# Patient Record
Sex: Female | Born: 1970 | Race: Black or African American | Hispanic: No | Smoking: Former smoker
Health system: Southern US, Community
[De-identification: ages and names within clinical notes are randomized; demographics above are authoritative.]

## PROBLEM LIST (undated history)

## (undated) DIAGNOSIS — I10 Essential (primary) hypertension: Secondary | ICD-10-CM

## (undated) DIAGNOSIS — E119 Type 2 diabetes mellitus without complications: Secondary | ICD-10-CM

## (undated) DIAGNOSIS — K219 Gastro-esophageal reflux disease without esophagitis: Secondary | ICD-10-CM

## (undated) DIAGNOSIS — T883XXA Malignant hyperthermia due to anesthesia, initial encounter: Secondary | ICD-10-CM

## (undated) DIAGNOSIS — R748 Abnormal levels of other serum enzymes: Secondary | ICD-10-CM

## (undated) DIAGNOSIS — F32A Depression, unspecified: Secondary | ICD-10-CM

## (undated) DIAGNOSIS — F10939 Alcohol use, unspecified with withdrawal, unspecified: Secondary | ICD-10-CM

## (undated) DIAGNOSIS — M5412 Radiculopathy, cervical region: Secondary | ICD-10-CM

## (undated) DIAGNOSIS — F419 Anxiety disorder, unspecified: Secondary | ICD-10-CM

## (undated) HISTORY — DX: Alcohol use, unspecified with withdrawal, unspecified: F10.939

## (undated) HISTORY — PX: BRAIN SURGERY: SHX531

## (undated) HISTORY — DX: Abnormal levels of other serum enzymes: R74.8

## (undated) HISTORY — DX: Radiculopathy, cervical region: M54.12

---

## 1998-04-10 ENCOUNTER — Ambulatory Visit (HOSPITAL_COMMUNITY): Admission: RE | Admit: 1998-04-10 | Discharge: 1998-04-10 | Payer: Self-pay | Admitting: Obstetrics

## 1998-05-30 ENCOUNTER — Inpatient Hospital Stay (HOSPITAL_COMMUNITY): Admission: AD | Admit: 1998-05-30 | Discharge: 1998-06-01 | Payer: Self-pay | Admitting: Obstetrics

## 2002-06-04 ENCOUNTER — Encounter: Payer: Self-pay | Admitting: *Deleted

## 2002-06-04 ENCOUNTER — Emergency Department (HOSPITAL_COMMUNITY): Admission: EM | Admit: 2002-06-04 | Discharge: 2002-06-04 | Payer: Self-pay | Admitting: *Deleted

## 2002-11-27 ENCOUNTER — Encounter: Payer: Self-pay | Admitting: *Deleted

## 2002-11-27 ENCOUNTER — Ambulatory Visit (HOSPITAL_COMMUNITY): Admission: RE | Admit: 2002-11-27 | Discharge: 2002-11-27 | Payer: Self-pay | Admitting: *Deleted

## 2002-11-30 ENCOUNTER — Inpatient Hospital Stay (HOSPITAL_COMMUNITY): Admission: AD | Admit: 2002-11-30 | Discharge: 2002-12-01 | Payer: Self-pay | Admitting: *Deleted

## 2003-06-19 ENCOUNTER — Encounter: Payer: Self-pay | Admitting: Emergency Medicine

## 2003-06-19 ENCOUNTER — Emergency Department (HOSPITAL_COMMUNITY): Admission: EM | Admit: 2003-06-19 | Discharge: 2003-06-19 | Payer: Self-pay | Admitting: Emergency Medicine

## 2003-06-27 ENCOUNTER — Emergency Department (HOSPITAL_COMMUNITY): Admission: EM | Admit: 2003-06-27 | Discharge: 2003-06-27 | Payer: Self-pay | Admitting: Internal Medicine

## 2003-09-14 ENCOUNTER — Emergency Department (HOSPITAL_COMMUNITY): Admission: EM | Admit: 2003-09-14 | Discharge: 2003-09-14 | Payer: Self-pay | Admitting: Emergency Medicine

## 2004-06-16 ENCOUNTER — Inpatient Hospital Stay (HOSPITAL_COMMUNITY): Admission: AD | Admit: 2004-06-16 | Discharge: 2004-06-19 | Payer: Self-pay | Admitting: Obstetrics & Gynecology

## 2005-04-14 ENCOUNTER — Emergency Department (HOSPITAL_COMMUNITY): Admission: EM | Admit: 2005-04-14 | Discharge: 2005-04-14 | Payer: Self-pay | Admitting: *Deleted

## 2005-04-30 ENCOUNTER — Emergency Department (HOSPITAL_COMMUNITY): Admission: EM | Admit: 2005-04-30 | Discharge: 2005-04-30 | Payer: Self-pay | Admitting: Emergency Medicine

## 2005-07-19 ENCOUNTER — Observation Stay (HOSPITAL_COMMUNITY): Admission: AD | Admit: 2005-07-19 | Discharge: 2005-07-19 | Payer: Self-pay | Admitting: Obstetrics and Gynecology

## 2005-08-18 ENCOUNTER — Ambulatory Visit (HOSPITAL_COMMUNITY): Admission: RE | Admit: 2005-08-18 | Discharge: 2005-08-18 | Payer: Self-pay | Admitting: Obstetrics and Gynecology

## 2005-10-13 ENCOUNTER — Inpatient Hospital Stay (HOSPITAL_COMMUNITY): Admission: AD | Admit: 2005-10-13 | Discharge: 2005-10-15 | Payer: Self-pay | Admitting: Obstetrics and Gynecology

## 2006-06-15 ENCOUNTER — Emergency Department (HOSPITAL_COMMUNITY): Admission: EM | Admit: 2006-06-15 | Discharge: 2006-06-15 | Payer: Self-pay | Admitting: *Deleted

## 2006-09-14 ENCOUNTER — Ambulatory Visit (HOSPITAL_COMMUNITY): Admission: RE | Admit: 2006-09-14 | Discharge: 2006-09-14 | Payer: Self-pay | Admitting: Obstetrics & Gynecology

## 2006-12-24 ENCOUNTER — Emergency Department (HOSPITAL_COMMUNITY): Admission: EM | Admit: 2006-12-24 | Discharge: 2006-12-24 | Payer: Self-pay | Admitting: Emergency Medicine

## 2006-12-30 ENCOUNTER — Ambulatory Visit: Payer: Self-pay | Admitting: Orthopedic Surgery

## 2007-04-07 ENCOUNTER — Ambulatory Visit: Payer: Self-pay | Admitting: Orthopedic Surgery

## 2007-05-31 ENCOUNTER — Encounter: Payer: Self-pay | Admitting: Orthopedic Surgery

## 2008-10-07 ENCOUNTER — Emergency Department (HOSPITAL_COMMUNITY): Admission: EM | Admit: 2008-10-07 | Discharge: 2008-10-07 | Payer: Self-pay | Admitting: Emergency Medicine

## 2008-11-06 ENCOUNTER — Emergency Department (HOSPITAL_COMMUNITY): Admission: EM | Admit: 2008-11-06 | Discharge: 2008-11-06 | Payer: Self-pay | Admitting: Internal Medicine

## 2008-12-07 ENCOUNTER — Encounter (INDEPENDENT_AMBULATORY_CARE_PROVIDER_SITE_OTHER): Payer: Self-pay | Admitting: *Deleted

## 2008-12-29 ENCOUNTER — Emergency Department (HOSPITAL_COMMUNITY): Admission: EM | Admit: 2008-12-29 | Discharge: 2008-12-29 | Payer: Self-pay | Admitting: Emergency Medicine

## 2009-01-08 ENCOUNTER — Other Ambulatory Visit: Admission: RE | Admit: 2009-01-08 | Discharge: 2009-01-08 | Payer: Self-pay | Admitting: Obstetrics & Gynecology

## 2009-05-14 ENCOUNTER — Encounter: Payer: Self-pay | Admitting: Orthopedic Surgery

## 2009-08-30 ENCOUNTER — Emergency Department (HOSPITAL_COMMUNITY): Admission: EM | Admit: 2009-08-30 | Discharge: 2009-08-30 | Payer: Self-pay | Admitting: Emergency Medicine

## 2009-11-09 ENCOUNTER — Emergency Department (HOSPITAL_COMMUNITY): Admission: EM | Admit: 2009-11-09 | Discharge: 2009-11-09 | Payer: Self-pay | Admitting: Emergency Medicine

## 2010-01-06 ENCOUNTER — Emergency Department (HOSPITAL_COMMUNITY): Admission: EM | Admit: 2010-01-06 | Discharge: 2010-01-07 | Payer: Self-pay | Admitting: Emergency Medicine

## 2010-10-18 ENCOUNTER — Encounter: Payer: Self-pay | Admitting: Orthopedic Surgery

## 2010-10-29 ENCOUNTER — Ambulatory Visit: Payer: Self-pay | Admitting: Orthopedic Surgery

## 2010-10-29 DIAGNOSIS — M5412 Radiculopathy, cervical region: Secondary | ICD-10-CM | POA: Insufficient documentation

## 2010-10-29 DIAGNOSIS — M25519 Pain in unspecified shoulder: Secondary | ICD-10-CM | POA: Insufficient documentation

## 2011-01-20 NOTE — Assessment & Plan Note (Signed)
Summary: bi shoulder pain needs xr/ca mcd rchd/bsf   Vital Signs:  Patient profile:   40 year old female Height:      66 inches Weight:      165 pounds Pulse rate:   82 / minute Resp:     18 per minute  Vitals Entered By: Ether Griffins (October 29, 2010 2:26 PM)  Visit Type:  new patient Referring Byrd Rushlow:  Health Department Primary Cristela Stalder:  Health Department  CC:  neck and left shoulder pain.  History of Present Illness: I saw Heidi [name] in the office today for an initial visit.  She is a 40 years old woman with the complaint of:  neck and left shoulder pain.  Xrays today.  Meds: Flexeril and Ibuprofen 800mg .  2008 was seen for left shoulder and received injection.   40 year old female, who is employed in what sounds like a cleaners, presents with cervical pain radiating down to the LEFT hand, associated with some numbness and weakness, but minimal shoulder pain, somewhat relieved with Flexeril and ibuprofen. Presents for evaluation. No trauma.     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 Cardiovascular:  Denies chest pain, palpitations, fainting, and murmurs. Respiratory:  Denies short of breath, wheezing, couch, tightness, pain on inspiration, and snoring . Gastrointestinal:  Denies heartburn, nausea, vomiting, diarrhea, constipation, and blood in your stools. Genitourinary:  Denies frequency, urgency, difficulty urinating, painful urination, flank pain, and bleeding in urine. Neurologic:  Denies numbness, tingling, unsteady gait, dizziness,  tremors, and seizure. Musculoskeletal:  Denies joint pain, swelling, instability, stiffness, redness, heat, and muscle pain. Endocrine:  Denies excessive thirst, exessive urination, and heat or cold intolerance. Psychiatric:  Denies nervousness, depression, anxiety, and hallucinations. Skin:  Denies changes in the skin, poor healing, rash, itching, and redness. HEENT:  Denies blurred or double vision, eye pain, redness, and watering. Immunology:  Denies seasonal allergies, sinus problems, and allergic to bee stings. Hemoatologic:  Denies easy bleeding and brusing.  Physical Exam  Skin:  intact without lesions or rashes Cervical Nodes:  no significant adenopathy Psych:  alert and cooperative; normal mood and affect; normal attention span and concentration Additional Exam:  There is decreased cervical range of motion tenderness in the upper LEFT trap and cervical spine.  There is loss of normal cervical lordosis.   Shoulder/Elbow Exam  General:    Well-developed, well-nourished, normal body habitus; no deformities, normal grooming.    Palpation:    Non-tender to palpation bilaterally.    Vascular:    Radial, ulnar, brachial, and axillary pulses 2+ and symmetric; capillary refill less than 2 seconds; no evidence of ischemia, clubbing, or cyanosis.    Sensory:    Gross sensation intact in the upper extremities.    Motor:    Normal strength in the upper extremities.    Reflexes:    Normal reflexes in the upper extremities.    Shoulder Exam:    Right:    Inspection:  Normal    Palpation:  Normal    Stability:  stable    Tenderness:  no    Swelling:  no  Erythema:  no    Range of Motion:       Flexion-Active: 180       Extension-Active: 45       Flexion-Passive: 180       Extension-Passive: 45       External Rotation : 45       Interior Rotation : T7    Left:    Inspection:  Normal    Palpation:  Normal    Stability:  stable    Tenderness:  no    Swelling:  no     Erythema:  no    Range of Motion:       Flexion-Active: 180       Extension-Active: 45       Flexion-Passive: 180       Extension-Passive: 45       External Rotation : 45       Interior Rotation : T7  Impingement Sign NEER:    Left negative Impingement Sign HAWKINS:    Left negative   Impression & Recommendations:  Problem # 1:  CERVICAL RADICULOPATHY (ICD-723.4) Assessment New  radiographs AP, lateral, LEFT shoulder, show no abnormality with a type II acromion.  Impression type II acromion. Normal shoulder.  Cervical spine AP, lateral, shows loss of cervical lordosis. No subluxation. No fracture. No displacement narrowing.  Impression normal next item except for loss of the cervical lordosis.  Overall impression probably cervical radicular pain. Recommend a run and continue Flexeril, and ibuprofen  Orders: New Patient Level III (16109) Cervical x-ray 2/3 views (60454)  Problem # 2:  SHOULDER PAIN (ICD-719.41) Assessment: New  Orders: New Patient Level III (09811) Shoulder x-ray,  minimum 2 views (91478)  Medications Added to Medication List This Visit: 1)  Neurontin 100 Mg Caps (Gabapentin) .Marland Kitchen.. 1 by mouth at hs  Patient Instructions: 1)  Start new medication. 2)  Come back as needed Prescriptions: NEURONTIN 100 MG CAPS (GABAPENTIN) 1 by mouth at hs  #30 x 1   Entered and Authorized by:   Fuller Canada MD   Signed by:   Fuller Canada MD on 10/29/2010   Method used:   Print then Give to Patient   RxID:   [id number]    Orders Added: 1)  New Patient Level III [62952] 2)  Shoulder x-ray,  minimum 2 views [73030] 3)  Cervical x-ray 2/3 views [72040]

## 2011-01-20 NOTE — Progress Notes (Signed)
Summary: Progress notes  Progress notes   Imported By: Jacklynn Ganong 10/27/2010 14:01:22  _____________________________________________________________________  External Attachment:    Type:   Image     Comment:   External Document

## 2011-01-20 NOTE — Progress Notes (Signed)
Summary: int evaluation form  int evaluation form   Imported By: Jacklynn Ganong 10/27/2010 13:52:58  _____________________________________________________________________  External Attachment:    Type:   Image     Comment:   External Document

## 2011-01-23 NOTE — Letter (Signed)
Summary: history form  history form   Imported By: Jacklynn Ganong 10/30/2010 10:03:24  _____________________________________________________________________  External Attachment:    Type:   Image     Comment:   External Document

## 2011-01-28 ENCOUNTER — Encounter: Payer: Self-pay | Admitting: Orthopedic Surgery

## 2011-01-28 ENCOUNTER — Ambulatory Visit (INDEPENDENT_AMBULATORY_CARE_PROVIDER_SITE_OTHER): Payer: Medicaid Other | Admitting: Orthopedic Surgery

## 2011-01-28 DIAGNOSIS — M542 Cervicalgia: Secondary | ICD-10-CM | POA: Insufficient documentation

## 2011-01-29 ENCOUNTER — Encounter: Payer: Self-pay | Admitting: Orthopedic Surgery

## 2011-02-04 ENCOUNTER — Ambulatory Visit (HOSPITAL_COMMUNITY)
Admission: RE | Admit: 2011-02-04 | Discharge: 2011-02-04 | Disposition: A | Discharge status: Home / Self Care | Payer: Medicaid Other | Source: Ambulatory Visit | Attending: Nurse Practitioner | Admitting: Nurse Practitioner

## 2011-02-04 DIAGNOSIS — M542 Cervicalgia: Secondary | ICD-10-CM | POA: Insufficient documentation

## 2011-02-04 DIAGNOSIS — M6281 Muscle weakness (generalized): Secondary | ICD-10-CM | POA: Insufficient documentation

## 2011-02-04 DIAGNOSIS — IMO0001 Reserved for inherently not codable concepts without codable children: Secondary | ICD-10-CM | POA: Insufficient documentation

## 2011-02-05 NOTE — Miscellaneous (Signed)
Summary: Rehab Report  Rehab Report   Imported By: Cammie Sickle 01/29/2011 08:55:06  _____________________________________________________________________  External Attachment:    Type:   Image     Comment:   External Document

## 2011-02-05 NOTE — Miscellaneous (Signed)
  Clinical Lists Changes  Medications: Added new medication of CATAFLAM 50 MG TABS (DICLOFENAC POTASSIUM) one tablet twice daily - Signed Added new medication of PREDNISONE (PAK) 10 MG TABS (PREDNISONE) as directed - Signed Rx of CATAFLAM 50 MG TABS (DICLOFENAC POTASSIUM) one tablet twice daily;  #60 x 0;  Signed;  Entered by: Fuller Canada MD;  Authorized by: Fuller Canada MD;  Method used: Print then Give to Patient Rx of PREDNISONE (PAK) 10 MG TABS (PREDNISONE) as directed;  #1 x 0;  Signed;  Entered by: Fuller Canada MD;  Authorized by: Fuller Canada MD;  Method used: Print then Give to Patient    Prescriptions: PREDNISONE (PAK) 10 MG TABS (PREDNISONE) as directed  #1 x 0   Entered and Authorized by:   Fuller Canada MD   Signed by:   Fuller Canada MD on 01/29/2011   Method used:   Print then Give to Patient   RxID:   [id number] CATAFLAM 50 MG TABS (DICLOFENAC POTASSIUM) one tablet twice daily  #60 x 0   Entered and Authorized by:   Fuller Canada MD   Signed by:   Fuller Canada MD on 01/29/2011   Method used:   Print then Give to Patient   RxID:   [id number]

## 2011-02-05 NOTE — Assessment & Plan Note (Signed)
Summary: bi shoulder pain rt is new problem needs xr/rchd to fax refer...   Visit Type:  Follow-up Referring Provider:  Health Department Primary Provider:  Health Department  CC:  bilateral shoulder and neck pain.  History of Present Illness: I saw Heidi [name] in the office today for a followup visit.  She is a 40 years old woman with the complaint of:  bilateral shoulder and neck pain.    40 year old female presents back after previous evaluation for neck and shoulder pain with bilateral shoulder pain and neck pain.  Her pain is in the trapezius muscles and is present only at night.  Her pain is bilateral.  She has been on various anti-inflammatories including naproxen and nabumetone.  She's also been on Flexeril and Neurontin and tramadol.  She now has complaints of dull 8/10 pain in the trapezius muscles without any neck pain or pain around the deltoid area.  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 Musculoskeletal:  Complains of joint pain and muscle pain; denies instability.  Physical Exam  Additional Exam:  bilateral shoulder exam reveals full range of motion without tenderness around the glenohumeral joint lines or the deltoid.  She does have tenderness in both trapezius muscles.  She is nontender over the cervical spine  Cervical spine range of motion is normal  Strength in her upper extremities normal both shoulders  are stable.     Shoulder/Elbow Exam  General:    Well-developed, well-nourished, normal body habitus; no deformities, normal grooming.    Skin:    Intact, no scars, lesions, rashes, cafe au lait spots or bruising.    Vascular:    Radial, ulnar, brachial, and axillary pulses 2+ and symmetric; capillary refill less than 2 seconds; no evidence of ischemia, clubbing, or cyanosis.    Sensory:    Gross sensation intact in the upper extremities.    Motor:    Normal strength in the upper extremities.    Reflexes:    Normal reflexes in the upper extremities.     Impression & Recommendations:  Problem # 1:  CERVICALGIA (ICD-723.1) Assessment Deteriorated  previous x-rays show straightening of the cervical spine but she does not have any neurologic deficits or weakness.  I think he is perhaps some tension in the cervical spine trapezius muscles and she would benefit from rehabilitation and continued on anti-inflammatory medication  Orders: Est. Patient Level III (16109)  Patient Instructions: 1)  home cervical traction 5 lbs 15 min / day  2)  cervical pillow from [facility]  3)  PT at [facility] neck strain x 4 weeks  4)  start dose pack 12 days followed by cataflam 50 mg two times a day  5)  continue the cyclobenzaprine  6)  stop ibuprofen  7)  Please schedule a follow-up appointment as needed.   Orders Added: 1)  Est. Patient Level III [60454]

## 2011-02-09 ENCOUNTER — Encounter: Payer: Self-pay | Admitting: Orthopedic Surgery

## 2011-02-10 ENCOUNTER — Ambulatory Visit (HOSPITAL_COMMUNITY)
Admission: RE | Admit: 2011-02-10 | Discharge: 2011-02-10 | Disposition: A | Discharge status: Home / Self Care | Payer: Medicaid Other | Source: Ambulatory Visit | Attending: *Deleted | Admitting: *Deleted

## 2011-02-11 NOTE — Medication Information (Signed)
Summary: Tax adviser   Imported By: Cammie Sickle 02/03/2011 20:45:45  _____________________________________________________________________  External Attachment:    Type:   Image     Comment:   External Document

## 2011-02-17 ENCOUNTER — Ambulatory Visit (HOSPITAL_COMMUNITY): Payer: Medicaid Other | Admitting: Physical Therapy

## 2011-02-17 NOTE — Miscellaneous (Signed)
Summary: PT initial evaluation  PT initial evaluation   Imported By: Jacklynn Ganong 02/10/2011 14:59:51  _____________________________________________________________________  External Attachment:    Type:   Image     Comment:   External Document

## 2011-02-17 NOTE — Letter (Signed)
Summary: Historic Patient File  Historic Patient File   Imported By: Jacklynn Ganong 02/09/2011 11:12:36  _____________________________________________________________________  External Attachment:    Type:   Image     Comment:   External Document

## 2011-02-17 NOTE — Letter (Signed)
Summary: Referral notes  Referral notes   Imported By: Jacklynn Ganong 02/09/2011 14:16:42  _____________________________________________________________________  External Attachment:    Type:   Image     Comment:   External Document

## 2011-04-26 ENCOUNTER — Emergency Department (HOSPITAL_COMMUNITY)
Admission: EM | Admit: 2011-04-26 | Discharge: 2011-04-26 | Disposition: A | Discharge status: Home / Self Care | Payer: Medicaid Other | Attending: Emergency Medicine | Admitting: Emergency Medicine

## 2011-04-26 DIAGNOSIS — K5289 Other specified noninfective gastroenteritis and colitis: Secondary | ICD-10-CM | POA: Insufficient documentation

## 2011-04-26 LAB — HEPATIC FUNCTION PANEL
ALT: 16 U/L (ref 0–35)
Alkaline Phosphatase: 83 U/L (ref 39–117)
Total Protein: 7.5 g/dL (ref 6.0–8.3)

## 2011-04-26 LAB — BASIC METABOLIC PANEL
BUN: 13 mg/dL (ref 6–23)
CO2: 20 mEq/L (ref 19–32)
Calcium: 9.5 mg/dL (ref 8.4–10.5)
Chloride: 105 mEq/L (ref 96–112)
Potassium: 3.5 mEq/L (ref 3.5–5.1)
Sodium: 139 mEq/L (ref 135–145)

## 2011-04-26 LAB — URINALYSIS, ROUTINE W REFLEX MICROSCOPIC
Nitrite: NEGATIVE
Specific Gravity, Urine: >1.03 — ABNORMAL HIGH (ref 1.005–1.030)

## 2011-04-26 LAB — DIFFERENTIAL
Basophils Absolute: 0 10*3/uL (ref 0.0–0.1)
Basophils Relative: 0 % (ref 0–1)
Eosinophils Relative: 0 % (ref 0–5)
Lymphocytes Relative: 3 % — ABNORMAL LOW (ref 12–46)
Neutro Abs: 15.3 10*3/uL — ABNORMAL HIGH (ref 1.7–7.7)
Neutrophils Relative %: 95 % — ABNORMAL HIGH (ref 43–77)

## 2011-05-08 NOTE — Op Note (Signed)
NAMENOZOMI, METTLER NO.:  [id number]   MEDICAL RECORD NO.:  [id number]          PATIENT TYPE:  INP   LOCATION:  A401                          FACILITY:  [facility]   PHYSICIAN:  Tilda Burrow, M.D. DATE OF BIRTH:  1971-02-09   DATE OF PROCEDURE:  DATE OF DISCHARGE:                                 OPERATIVE REPORT   Heidi [name] progressed steadily through labor. I arrived at the hospital at  about 12:15, and she was noted to be fully dilated with bulging membranes.  Artificial rupture of membranes revealed clear fluid. She pushed through  about three or four contractions and had a spontaneous vaginal delivery of a  viable female infant at 12:58. The shoulders were a little bit tight. They  were delivered by placing traction under the left axilla which was anterior.  I rotated the baby into an oblique position, and the shoulders delivered  after that. Apgars are 8 and 9. Weight 7 pounds 9 ounces which is a good  pound bigger than her biggest baby. Twenty units of Pitocin diluted in 1,000  cc of lactated ringers was infused rapidly IV. The placenta separated  spontaneously and was delivered via controlled cord traction at 11:03. It  was inspected and appears to be intact with a three-vessel cord. The fundus  was firm. Lochia was normal. No lacerations of the vagina were found.      Heidi [name], C.N.M.      Tilda Burrow, M.D.  Electronically Signed    FC/MEDQ  D:  10/13/2005  T:  10/14/2005  Job:  [id number]   cc:   Francoise Schaumann. Halford Chessman  Fax: [phone]

## 2011-05-08 NOTE — H&P (Signed)
NAMEPATCHES, MCDONNELL NO.:  [id number]   MEDICAL RECORD NO.:  [id number]          PATIENT TYPE:  INP   LOCATION:  A401                          FACILITY:  [facility]   PHYSICIAN:  Lazaro Arms, M.D.   DATE OF BIRTH:  12/26/1970   DATE OF ADMISSION:  10/13/2005  DATE OF DISCHARGE:  LH                                HISTORY & PHYSICAL   CHIEF COMPLAINT:  Active labor.   HISTORY OF PRESENT ILLNESS:  Heidi [name] is a 40 year old gravida 5, para 3, AB  1 with an EDC of October 08, 2005 based on her one and only second trimester  ultrasound. She had a total of two prenatal visits throughout the whole  pregnancy. She has tested positive for cocaine on every visit. In fact, she  admitted to using cocaine last night.   PRENATAL LABORATORY DATA:  Blood type B positive, rubella immune. HBSAG,  HIV, RPR and gonorrhea were negative. She had positive chlamydia. She  received treatment on August 29 in the hospital with 2 g of p.o.  erythromycin. Her boyfriend is incarcerated, and she states she has not had  sex since then. She has not had a proof of cure, however. Sickle cell was  also negative. Her two blood pressures were 126/60 and 120/64. Her first  visit at 15 weeks she weighed 137, and then at 28-1/2 weeks, she had gained  50 pounds. Weight today is unknown.   PAST MEDICAL HISTORY:  Benign.   PAST SURGICAL HISTORY:  No surgical history.   ALLERGIES:  No known drug allergies.   SOCIAL HISTORY:  Unemployed. Uses cocaine, marijuana. Denies cigarette  smoking or alcohol use.   OBSTETRICAL HISTORY:  She has had three vaginal deliveries from 1991 up to  2005. The first two children live with their father and a friend.  Apparently, the last child, a little boy, does live with her.   PHYSICAL EXAMINATION:  HEENT:  Within normal limits.  HEART:  Regular rate and rhythm.  LUNGS:  Clear.  ABDOMEN:  Soft and nontender.   Having strong contractions about every four minutes. Fetal  heart rate is  reactive without decelerations. Blood pressure is within normal limits.  Cervix upon admission was 6 cm, 100% effaced, bulging members, vertex  presentation at 0 station.   IMPRESSION:  Intrauterine pregnancy at about 41 weeks, active labor,  positive drug abuse, limited prenatal care, unknown GBS status.   PLAN:  Will treat with ampicillin IV, obtain a GBS culture and anticipate  vaginal delivery.      Heidi [name], C.N.M.      Lazaro Arms, M.D.  Electronically Signed    FC/MEDQ  D:  10/13/2005  T:  10/14/2005  Job:  [id number]

## 2011-05-08 NOTE — Discharge Summary (Signed)
NAMEJAYLIAH, Heidi [name] NO.:  [id number]   MEDICAL RECORD NO.:  [id number]                   PATIENT TYPE:  INP   LOCATION:  A417                                 FACILITY:  [facility]   PHYSICIAN:  Tilda Burrow, M.D.              DATE OF BIRTH:  May 19, 1971   DATE OF ADMISSION:  06/16/2004  DATE OF DISCHARGE:                                 DISCHARGE SUMMARY   ADMISSION DIAGNOSES:  1. Pregnancy, [redacted] weeks gestation, early labor.  2. History of cocaine abuse.   DISCHARGE DIAGNOSES:  1. Pregnancy, 39 weeks, delivered.  2. History of cocaine abuse.  3. 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   HISTORY OF PRESENT ILLNESS:  This 40 year old African American female,  gravida 2, para 1, abortus 1, at [redacted] weeks gestation, was admitted for active  labor management after presenting first in the office at 3-4 cm, declining  to come to the hospital, but then arriving later in the evening, found to be  9 cm dilated with anterior __________ membranes ruptured, light meconium  present.   PAST MEDICAL HISTORY:  1. Cocaine abuse.  2. History of asthma.   PAST SURGICAL HISTORY:  Negative.   SOCIAL HISTORY:  The patient has two prior children who are in the custody  of their biologic fathers.  One lives here and one lives in [place].  She  is not inclined to seek rehab efforts but is, at present, at least claiming  that she hopes to stay clean for awhile.  She acknowledges she has had  similar desires before.   HOSPITAL COURSE:  The patient was admitted and progressed promptly to  delivery on June 16, 2004, delivering at approximately 11 p.m. on June 16, 2004.  Postpartum course was notable for the patient's somewhat  argumentative approach to discussions.  She was positive for cocaine on  admitting urine drug screen.  Maternal blood type is B positive.  Admitting  hemoglobin was 11.2, hematocrit 32.9.  Postpartum hemoglobin 10.8,  hematocrit 31.7.  Blood gas on the infant was pH 7.240 with normal PO2 17.0.   At discharge, I had discussion with the patient regarding cocaine use as  well as social factors regarding her daughter.   PLAN:  1. Will initiate Seroquel 200 mg q.h.s. to assist with the patient's     insomnia and impulse behavior at night.  2. __________ one week, consider titrating to twice daily doses consistent     with treatment for possible bipolar disorder.  3. Contraception plans are undecided, though, she is most likely wishing to     go back to the birth control pills.   Additionally, her 57 year old daughter who lives in the area is sexually  active, and the patient is to initiate conversation with her ex-husband to  get the daughter in for birth control counseling.     ___________________________________________  Tilda Burrow, M.D.   JVF/MEDQ  D:  06/19/2004  T:  06/19/2004  Job:  [id number]

## 2011-05-08 NOTE — Discharge Summary (Signed)
NAMEJONAI, WEYLAND NO.:  [id number]   MEDICAL RECORD NO.:  [id number]                   PATIENT TYPE:  INP   LOCATION:  LDR4                                 FACILITY:  [facility]   PHYSICIAN:  Langley Gauss, M.D.                DATE OF BIRTH:  10-21-1971   DATE OF ADMISSION:  11/30/2002  DATE OF DISCHARGE:  12/01/2002                                 DISCHARGE SUMMARY   DIAGNOSES:  A 22-1/2-week intrauterine pregnancy with preterm premature  spontaneous rupture of membranes.   DELIVERY PERFORMED:  Spontaneous assisted vaginal delivery with breech  presentation of 460 g nonviable infant.   DISPOSITION AT TIME OF DISCHARGE:  The patient is given a copy of standard  discharge instructions.  She will however, follow up in the office in 1  week's time.  She is treated with Depo-Provera 150 mg IM.  In addition, the  patient would like to initiate oral contraceptives for birth control.  Likewise, this will help regulate her cycles after receiving the Depo-  Provera.   PERTINENT LABORATORY STUDIES:  The patient upon admission is noted to be  positive for cocaine.  She did have a Chlamydia culture performed 2 days  previously in the office which was also positive.  Hemoglobin 10.9,  hematocrit 32.3, white count of 7.3.  B positive blood type, negative  antibody screen.  PT and PTT within normal limits.  Platelet count of  171,000.   HOSPITAL COURSE:  See previous dictations.  The patient by history had  spontaneous rupture of membranes on November 26, 2002.  She was notified of  this on November 27, 2002, however, thereafter she failed to return to our  office.  Again on November 29, 2002 she contacted our office at which time  she was given an appointment for that day however, she failed to keep that  appointment and it was not until November 30, 2002 at which time the patient  began having significant abdominal uterine cramping as well as increased  leakage of fluid that she contacted both the office and the hospital at  which time the patient was advised to present to [facility].  At [facility]  the spontaneous rupture of membranes was confirmed.  The patient underwent  medical induction of labor utilizing a prostaglandin and E2 vaginal  suppositories.  A total of two suppositories were required to result in  delivery.  At time of delivery the patient did not bond at all with the  infant, she did not desire any viewing of the infant nor was any  photographic record requested.  The patient's hospitalization was continued  that night.  She was continued on the IV antibiotics as well as the IV  fluids.  The patient remained clinically stable.  She was thus discharged to  home on December 01, 2002.  At time of discharge it appears that [facility] will be taking care of the burial for this 460-gram 22-1/2-week  nonviable pregnancy.                                               Langley Gauss, M.D.    DC/MEDQ  D:  12/05/2002  T:  12/07/2002  Job:  [id number]

## 2011-05-08 NOTE — H&P (Signed)
Heidi [name], Heidi [name] NO.:  [id number]   MEDICAL RECORD NO.:  [id number]                   PATIENT TYPE:  INP   LOCATION:  LDR4                                 FACILITY:  [facility]   PHYSICIAN:  Langley Gauss, M.D.                DATE OF BIRTH:  October 08, 1971   DATE OF ADMISSION:  11/30/2002  DATE OF DISCHARGE:  12/01/2002                                HISTORY & PHYSICAL   HISTORY OF PRESENT ILLNESS:  This is a 40 year old gravida 3, para 2 at 20-  1/[redacted] weeks gestation who presents to [facility] with the diagnosis  of preterm premature spontaneous rupture of membranes which occurred by the  patient history on November 26, 2002.  The patient had contacted me on  November 26, 2002 in the early afternoon complaining of some vaginal spotting  only; however, after this telephone conversation, about 2100, the patient  states that she had a gush of bloody fluid.  Thereafter, she did not have  any recurrence of this and she came to our office on November 28, 2002, at  which time speculum examination revealed a small amount of bloody fluid from  the endocervix; however, I was unable to document any fern secondary to the  presence of the blood.  A transabdominal ultrasound had been performed which  revealed a 22-week intrauterine pregnancy in a breech presentation.  Fetal  cardiac activity was identified at that time but no amniotic fluid was  encountered.  The patient was referred to [facility], at which  time, in the Department of Radiology, ultrasound on November 27, 2002  confirmed minimal if not completely absent amniotic fluid volume.  Thereafter, the patient did not return to our office.  She did contact our  office on November 29, 2002 complaining of some abdominal cramping.  She was  given an appointment and advised to present on November 29, 2002; however,  the patient failed to keep that appointment.  The patient thereafter did  contact our  office in [facility] Labor and Delivery on November 30, 2002, at  which time she was advised to present to [facility] for evaluation.  During  discussion and evaluation on November 27, 2002, I had discussed with the  patient that she likely had experienced rupture of membranes and will  require medical induction of labor and, even though this fetus currently had  fetal cardiac activity, certainly this was a gestational age where it was  very clear that this would be a nonviable pregnancy and no resuscitative  efforts would be indicated at time of delivery.  The patient's prenatal  course has been complicated by previous positive cocaine screens.  The  patient was aware and advised that cocaine had deleterious effects on  pregnancy.  The patient likewise was noted to have a positive GC and a  positive Chlamydia culture.  She was treated appropriately at  the time of  diagnoses and states that her sexual partner also required and did receive  treatment.   PAST MEDICAL HISTORY:  She has two prior vaginal deliveries without  complications.  No other medical or surgical history.   PHYSICAL EXAMINATION:  GENERAL:  A very thin unhealthy-appearing black  female.  VITAL SIGNS:  Blood pressure is 92/59, temperature 97.3, pulse of 80,  respiratory rate is 20, height is 5 feet 3 inches, weight is 138 pounds.  HEENT:  Negative.  No adenopathy.  NECK:  Supple.  Thyroid is nonpalpable.  LUNGS:  Clear.  CARDIOVASCULAR:  Regular rate and rhythm.  EXTREMITIES:  Normal.  ABDOMEN:  Soft and nontender.  No surgical scars are identified.  PELVIC:  Normal external genitalia.  No lesions or ulcerations are  identified.  Sterile speculum examination performed previously revealed some  bloody fluid within the posterior vaginal fornix.  No cervical lesions are  identified.  The cervix was visually closed and on bimanual examination was  noted to be closed.  Uterus:  Soft and nontender, consistent with [redacted] weeks   gestation in this gravid uterus.  Noted at the posterior vaginal fornix is  some pooling of some amniotic fluid which does confirm presence of ferns.   ASSESSMENT:  A 22-1/2-week intrauterine pregnancy with preterm premature  spontaneous rupture of membranes documented now on sterile speculum  examination and by the complete absence of amniotic fluid on ultrasound.  In  addition, by patient history, she experienced spontaneous rupture of  membranes on November 26, 2002.  The patient is admitted for prostaglandin  evacuation of the uterine contents.  The patient is aware that this is a  nonviable pregnancy.  I did not check for presence or absence of fetal heart  tones on the day of induction, as this would not effect our management.  In  addition, the patient is aware that this is to be considered a nonviable  pregnancy.  The patient will be treated during the course of the induction  with intravenous Unasyn for coverage of possible GC and Chlamydia  infections.  All her questions are answered to the patient prior to  initiation of the induction.                                               Langley Gauss, M.D.    DC/MEDQ  D:  12/05/2002  T:  12/05/2002  Job:  [id number]

## 2011-05-08 NOTE — Group Therapy Note (Signed)
NAMEDELLIA, DONNELLY NO.:  [id number]   MEDICAL RECORD NO.:  [id number]          PATIENT TYPE:  OIB   LOCATION:  LDR3                          FACILITY:  [facility]   PHYSICIAN:  Tilda Burrow, M.D. DATE OF BIRTH:  04/03/1971   DATE OF PROCEDURE:  DATE OF DISCHARGE:  08/18/2005                                   PROGRESS NOTE   Yun came down to the emergency room with complaints with nausea and  vomiting.  They sent her upstairs since she was about 7 months pregnant.  She says that after she vomited she felt better.  She did complain of some  fundal tenderness with palpation.  She admitted to using cocaine yesterday.  Her UDS was positive.  CBC and UA were normal.  She did have a positive  Chlamydia a month ago but we were unable to reach her even after sending a  certified letter to her house so I treated her with 2 g of azithromycin p.o.  Her partner is in jail so it is not an issue at this time.  She was once  again counseled of the dangers of cocaine use during pregnancy including  uterine abruption and fetal death.  She had an ultrasound to assess the  placenta and the fetus and all was within normal limits, thus fetal heart  rate was reactive, no uterine activity was noted.  She no longer complained  of pain, nausea or vomiting and was discharged home in stable condition with  an appointment made to follow up in the office in the morning.      Jacklyn Shell, C.N.M.      Tilda Burrow, M.D.  Electronically Signed    FC/MEDQ  D:  08/18/2005  T:  08/18/2005  Job:  [id number]   cc:   [facility]

## 2011-05-08 NOTE — H&P (Signed)
Heidi [name], [name] NO.:  [id number]   MEDICAL RECORD NO.:  [id number]                   PATIENT TYPE:  INP   LOCATION:  LDR2                                 FACILITY:  [facility]   PHYSICIAN:  Lazaro Arms, M.D.                DATE OF BIRTH:  Jun 17, 1971   DATE OF ADMISSION:  06/16/2004  DATE OF DISCHARGE:                                HISTORY & PHYSICAL   HISTORY OF PRESENT ILLNESS:  [name] is a 40 year old African American  female, gravida 4, para 2, AB 1, with estimated delivery of June 21, 2004,  currently at [redacted] weeks gestation, who presented to labor and delivery with  complaint of labor.  She was seen in the office earlier today and was found  to be 3 to 4 cm, and was encourage to come to the hospital.  The patient  declined.  She has a history of cocaine abuse before and during the  pregnancy.  She has had six prenatal visits total.  She has had very sketchy  care.  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   The patient presented, and she was found to be anterior lip with ruptured  membranes, meconium fluid light.  Fetal heart rate tracing was stable in the  140's.   PAST MEDICAL HISTORY:  1. Significant for cocaine abuse.  2. Asthma.   PAST SURGICAL HISTORY:  Negative.   PAST OB HISTORY:  She had two vaginal deliveries in 1991 and in 1999, and  December of 2003, she had premature rupture of membranes and miscarriage of  16 weeks.   LABORATORY DATA:  Blood type is B positive, rubella immune, antibody screen  negative.  Hepatitis B was negative.  HIV was nonreactive.  Serology was  nonreactive.  GC and chlamydia were negative.  She declined an AFP test.  Her Pap smear was normal.  She does have a history of positive group B  strep.   She was given a dose of ampicillin, but only one dose could get in.   ALLERGIES:  None.   MEDICATIONS:  1. Prevacid.  2. Zovirax  prophylaxis.   IMPRESSION:  1. Intrauterine pregnancy at [redacted] weeks gestation.  2. Labor.  3. History of cocaine abuse.  4. Declined admission to hospital earlier in the day for what was thought to     be early labor.   PLAN:  The patient is admitted for expectant management.     ___________________________________________                                         Lazaro Arms, M.D.   LHE/MEDQ  D:  06/16/2004  T:  06/16/2004  Job:  [id number]

## 2011-05-08 NOTE — Op Note (Signed)
Heidi [name], RATHEL NO.:  [id number]   MEDICAL RECORD NO.:  [id number]                   PATIENT TYPE:  INP   LOCATION:  LDR4                                 FACILITY:  [facility]   PHYSICIAN:  Langley Gauss, M.D.                DATE OF BIRTH:  Apr 25, 1971   DATE OF PROCEDURE:  11/30/2002  DATE OF DISCHARGE:  12/01/2002                                  PROCEDURE NOTE   DELIVERY NOTE:   DIAGNOSIS:  A 22-1/2 week intrauterine pregnancy with preterm premature  spontaneous rupture of membranes.   PROCEDURES:  Prostaglandin E2 placement of vaginal suppositories on two  occasions resulting in medical induction of labor and delivery.  Delivered  in a breech presentation is a nonviable female fetus weighing 460 g.  The  infant was noted to deliver in a breech presentation.  Notably, Apgar scores  0 and 0.  No fetal cardiac activity identified at time of delivery, nor was  any fetal movement noted.  Incidental finding at time of delivery was a  nuchal cord x1, which is rather tight.  This may have occurred as a result  of the lack of amniotic fluid.  Delivered also was an intact three-vessel  placenta with no complications.   HOSPITAL COURSE:  Upon admission the cervix was noted to be closed.  The  patient had a prostaglandin E2 vaginal suppository placed, which did result  in some cramping.  This was repeated four hours later, which did result in  significant uterine cramping.  The patient did have some nausea and  vomiting, which was treated with IV Phenergan.  The patient was thereafter  examined by the nursing staff in preparation for placement of a third  prostaglandin suppository; however, at that time the fetal buttocks were  noted to be present at the introitus.  Thus, I presented to [facility], at which time the patient delivered in a frank breech presentation  this nonviable 460 g morphologically externally-appearing normal female  infant.   Following delivery the cord was doubly clamped and cut.  The  patient did not desire any viewing of the nonviable infant, nor did she  request any photographic record to be kept.  After the placenta was clamped,  an expectant management period of about 45 minutes was required until the  placenta was noted to separate and deliver apparently intact with a three-  vessel umbilical cord.  Excellent uterine tone was achieved immediately  following delivery.  There was no excessive bleeding.  There was no evidence  of any retained placental fragments.  Thus, the patient was taken out of  lithotomy position.  She is noted to be afebrile, in stable condition.  The  patient at this point in time will be continued on the IV Unasyn and  hospitalization continued to the following day.  Notably, the patient has  contacted [facility],  and is uncertain whether she will prefer  burial or whether the hospital laboratory will make arrangements.                                                Langley Gauss, M.D.    DC/MEDQ  D:  12/05/2002  T:  12/06/2002  Job:  [id number]

## 2011-05-08 NOTE — Op Note (Signed)
NAMEAZJAH, PARDO NO.:  [id number]   MEDICAL RECORD NO.:  [id number]                   PATIENT TYPE:  INP   LOCATION:  LDR2                                 FACILITY:  [facility]   PHYSICIAN:  Lazaro Arms, M.D.                DATE OF BIRTH:  1971/02/06   DATE OF PROCEDURE:  DATE OF DISCHARGE:                                 OPERATIVE REPORT   DELIVERY NOTE:  Perri presented with anterior lip and ruptured membranes  this evening.  She required some Pitocin for augmentation. Her fetal heart  rate tracing was in the 1-teens to 1-twenties. She got Nubain and had good  beat-to-beat variability, but it was overall a little bit flat.  She began  to have deep variables.  She was encouraged to push.  She was very difficult  to control and very unruly in the second stage of labor.  An episiotomy was  made and over this episiotomy was delivered a viable female infant at 2252  with Apgars or 9 and 9 with weight being 6 pounds and 12 ounces.  There was  a 3-vessel cord.  Cord blood and cord gas was sent.  Placenta was normal and  intact. Uterus was firm below the umbilicus.  Bleeding was normal.  The  episiotomy was repaired without difficulty.   The patient was very uncooperative with pushing out the shoulders, I  delivered the anterior shoulder separately and then the posterior shoulder.  __________ Vianne Bulls Ward, each were on either side and  encouraging the patient, holding her legs back, but it was very difficulty.  There was no excessive traction placed on the fetal vertex.  Instead, I  delivered the fetal shoulders under the axilla separately without  difficulty.  The infant did well, did not require any positive pressure O2.  The patient will undergo routine postpartum care.  She was group B Strep  positive; but, of course, only received 1 dose of Ampicillin.      ___________________________________________            Lazaro Arms, M.D.   LHE/MEDQ  D:  06/16/2004  T:  06/17/2004  Job:  [id number]

## 2012-01-04 ENCOUNTER — Encounter (HOSPITAL_COMMUNITY): Payer: Self-pay | Admitting: *Deleted

## 2012-01-04 ENCOUNTER — Emergency Department (HOSPITAL_COMMUNITY)
Admission: EM | Admit: 2012-01-04 | Discharge: 2012-01-04 | Disposition: A | Discharge status: Home / Self Care | Payer: Medicaid Other | Attending: Emergency Medicine | Admitting: Emergency Medicine

## 2012-01-04 DIAGNOSIS — K529 Noninfective gastroenteritis and colitis, unspecified: Secondary | ICD-10-CM

## 2012-01-04 DIAGNOSIS — K5289 Other specified noninfective gastroenteritis and colitis: Secondary | ICD-10-CM | POA: Insufficient documentation

## 2012-01-04 DIAGNOSIS — R1110 Vomiting, unspecified: Secondary | ICD-10-CM | POA: Insufficient documentation

## 2012-01-04 DIAGNOSIS — Z79899 Other long term (current) drug therapy: Secondary | ICD-10-CM | POA: Insufficient documentation

## 2012-01-04 DIAGNOSIS — R109 Unspecified abdominal pain: Secondary | ICD-10-CM | POA: Insufficient documentation

## 2012-01-04 LAB — URINE MICROSCOPIC-ADD ON

## 2012-01-04 LAB — URINALYSIS, ROUTINE W REFLEX MICROSCOPIC
Bilirubin Urine: NEGATIVE
Glucose, UA: NEGATIVE mg/dL
Hgb urine dipstick: NEGATIVE

## 2012-01-04 LAB — BASIC METABOLIC PANEL
Calcium: 10.7 mg/dL — ABNORMAL HIGH (ref 8.4–10.5)
GFR calc non Af Amer: >90 mL/min (ref >90)
Glucose, Bld: 110 mg/dL — ABNORMAL HIGH (ref 70–99)

## 2012-01-04 LAB — CBC: WBC: 17.6 10*3/uL — ABNORMAL HIGH (ref 4.0–10.5)

## 2012-01-04 MED ORDER — ONDANSETRON HCL 4 MG/2ML IJ SOLN
4.0000 mg | Freq: Once | INTRAMUSCULAR | Status: AC
  Administered 2012-01-04: 4 mg via INTRAVENOUS

## 2012-01-04 MED ORDER — PROMETHAZINE HCL 25 MG PO TABS: 25.0000 mg | ORAL_TABLET | Freq: Four times a day (QID) | ORAL | Status: AC | PRN

## 2012-01-04 MED ORDER — ONDANSETRON HCL 4 MG/2ML IJ SOLN
4.0000 mg | Freq: Once | INTRAMUSCULAR | Status: DC
  Filled 2012-01-04: qty 2

## 2012-01-04 MED ORDER — RANITIDINE HCL 150 MG PO CAPS: 150.0000 mg | ORAL_CAPSULE | Freq: Two times a day (BID) | ORAL | Status: DC

## 2012-01-04 MED ORDER — PANTOPRAZOLE SODIUM 40 MG IV SOLR
40.0000 mg | Freq: Once | INTRAVENOUS | Status: AC
  Administered 2012-01-04: 40 mg via INTRAVENOUS

## 2012-01-04 MED ORDER — SODIUM CHLORIDE 0.9 % IV BOLUS (SEPSIS)
1000.0000 mL | Freq: Once | INTRAVENOUS | Status: AC
  Administered 2012-01-04: 1000 mL via INTRAVENOUS

## 2012-01-04 NOTE — ED Provider Notes (Signed)
History     CSN: [id number]  Arrival date & time 01/04/12  1130   First MD Initiated Contact with Patient 01/04/12 1314      Chief Complaint  Patient presents with  . Abdominal Pain  . Emesis    (Consider location/radiation/quality/duration/timing/severity/associated sxs/prior treatment) Patient is a 41 y.o. female presenting with abdominal pain and vomiting. The history is provided by the patient (the patient complains of vomiting. This started today. She states that she's not thrown up any blood.).  Abdominal Pain The primary symptoms of the illness include abdominal pain and vomiting. The primary symptoms of the illness do not include fatigue, shortness of breath or diarrhea. The current episode started 13 to 24 hours ago. The onset of the illness was sudden.  The vomiting began today. Vomiting occurs 6 to 10 times per day. The emesis contains stomach contents.  The patient states that she believes she is currently not pregnant. The patient has not had a change in bowel habit. Symptoms associated with the illness do not include heartburn, constipation, hematuria, frequency or back pain. Significant associated medical issues do not include HIV.  Emesis  Associated symptoms include abdominal pain. Pertinent negatives include no cough, no diarrhea and no headaches.    History reviewed. No pertinent past medical history.  History reviewed. No pertinent past surgical history.  No family history on file.  History  Substance Use Topics  . Smoking status: Never Smoker   . Smokeless tobacco: Not on file  . Alcohol Use: No    OB History    Grav Para Term Preterm Abortions TAB SAB Ect Mult Living                  Review of Systems  Constitutional: Negative for fatigue.  HENT: Negative for congestion, sinus pressure and ear discharge.   Eyes: Negative for discharge.  Respiratory: Negative for cough and shortness of breath.   Cardiovascular: Negative for chest pain.    Gastrointestinal: Positive for vomiting and abdominal pain. Negative for heartburn, diarrhea and constipation.  Genitourinary: Negative for frequency and hematuria.  Musculoskeletal: Negative for back pain.  Skin: Negative for rash.  Neurological: Negative for seizures and headaches.  Hematological: Negative.   Psychiatric/Behavioral: Negative for hallucinations.    Allergies  Review of patient's allergies indicates no known allergies.  Home Medications   Current Outpatient Rx  Name Route Sig Dispense Refill  . DIPHENHYDRAMINE HCL 25 MG PO CAPS Oral Take 25 mg by mouth every 6 (six) hours as needed. For allergies    . ESCITALOPRAM OXALATE 10 MG PO TABS Oral Take 10 mg by mouth daily.    Marland Kitchen FERROUS SULFATE 325 (65 FE) MG PO TABS Oral Take 325 mg by mouth every other day.    . IBUPROFEN 200 MG PO TABS Oral Take 400 mg by mouth every 6 (six) hours as needed. For headache    . NAPROXEN SODIUM 220 MG PO TABS Oral Take 220 mg by mouth every 8 (eight) hours as needed. For pain    . NORGESTIM-ETH ESTRAD TRIPHASIC 0.18/0.215/0.25 MG-35 MCG PO TABS Oral Take 1 tablet by mouth daily.    Marland Kitchen PROMETHAZINE HCL 25 MG PO TABS Oral Take 1 tablet (25 mg total) by mouth every 6 (six) hours as needed for nausea. 15 tablet 0  . RANITIDINE HCL 150 MG PO CAPS Oral Take 1 capsule (150 mg total) by mouth 2 (two) times daily. 30 capsule 0    BP 133/100  Pulse  98  Temp(Src) 97.5 F (36.4 C) (Oral)  Resp 16  Ht 5\' 3"  (1.6 m)  Wt 167 lb (75.751 kg)  BMI 29.58 kg/m2  SpO2 98%  LMP 12/28/2011  Physical Exam  Constitutional: She is oriented to person, place, and time. She appears well-developed.  HENT:  Head: Normocephalic and atraumatic.  Eyes: Conjunctivae and EOM are normal. No scleral icterus.  Neck: Neck supple. No thyromegaly present.  Cardiovascular: Normal rate and regular rhythm.  Exam reveals no gallop and no friction rub.   No murmur heard. Pulmonary/Chest: No stridor. She has no wheezes. She  has no rales. She exhibits no tenderness.  Abdominal: She exhibits no distension. There is no tenderness. There is no rebound.  Musculoskeletal: Normal range of motion. She exhibits no edema.  Lymphadenopathy:    She has no cervical adenopathy.  Neurological: She is oriented to person, place, and time. Coordination normal.  Skin: No rash noted. No erythema.  Psychiatric: She has a normal mood and affect. Her behavior is normal.    ED Course  Procedures (including critical care time)  Labs Reviewed  URINALYSIS, ROUTINE W REFLEX MICROSCOPIC - Abnormal; Notable for the following:    Specific Gravity, Urine >1.030 (*)    Protein, ur 30 (*)    All other components within normal limits  CBC - Abnormal; Notable for the following:    WBC 17.6 (*)    All other components within normal limits  BASIC METABOLIC PANEL - Abnormal; Notable for the following:    Glucose, Bld 110 (*)    Calcium 10.7 (*)    All other components within normal limits  URINE MICROSCOPIC-ADD ON - Abnormal; Notable for the following:    Bacteria, UA FEW (*)    All other components within normal limits  POCT PREGNANCY, URINE  POCT PREGNANCY, URINE   No results found.   1. Gastroenteritis    Pt improved with tx Results for orders placed during the hospital encounter of 01/04/12  URINALYSIS, ROUTINE W REFLEX MICROSCOPIC      Component Value Range   Color, Urine YELLOW  YELLOW    APPearance CLEAR  CLEAR    Specific Gravity, Urine >1.030 (*) 1.005 - 1.030    pH 6.0  5.0 - 8.0    Glucose, UA NEGATIVE  NEGATIVE (mg/dL)   Hgb urine dipstick NEGATIVE  NEGATIVE    Bilirubin Urine NEGATIVE  NEGATIVE    Ketones, ur NEGATIVE  NEGATIVE (mg/dL)   Protein, ur 30 (*) NEGATIVE (mg/dL)   Urobilinogen, UA 0.2  0.0 - 1.0 (mg/dL)   Nitrite NEGATIVE  NEGATIVE    Leukocytes, UA NEGATIVE  NEGATIVE   CBC      Component Value Range   WBC 17.6 (*) 4.0 - 10.5 (K/uL)   RBC 4.98  3.87 - 5.11 (MIL/uL)   Hemoglobin 14.3  12.0 - 15.0  (g/dL)   HCT 96.0  45.4 - 09.8 (%)   MCV 83.5  78.0 - 100.0 (fL)   MCH 28.7  26.0 - 34.0 (pg)   MCHC 34.4  30.0 - 36.0 (g/dL)   RDW 11.9  14.7 - 82.9 (%)   Platelets 335  150 - 400 (K/uL)  BASIC METABOLIC PANEL      Component Value Range   Sodium 140  135 - 145 (mEq/L)   Potassium 3.8  3.5 - 5.1 (mEq/L)   Chloride 101  96 - 112 (mEq/L)   CO2 25  19 - 32 (mEq/L)   Glucose, Bld  110 (*) 70 - 99 (mg/dL)   BUN 15  6 - 23 (mg/dL)   Creatinine, Ser 4.09  0.50 - 1.10 (mg/dL)   Calcium 81.1 (*) 8.4 - 10.5 (mg/dL)   GFR calc non Af Amer >90  >90 (mL/min)   GFR calc Af Amer >90  >90 (mL/min)  POCT PREGNANCY, URINE      Component Value Range   Preg Test, Ur NEGATIVE    URINE MICROSCOPIC-ADD ON      Component Value Range   Squamous Epithelial / LPF RARE  RARE    WBC, UA 0-2  <3 (WBC/hpf)   Bacteria, UA FEW (*) RARE    Urine-Other MUCUS PRESENT     No results found.    MDM          Benny Lennert, MD 01/04/12 1434

## 2012-01-04 NOTE — ED Notes (Signed)
Patient is alert and oriented x 4 with respirations even and unlabored.  NAD at this time.  Discharge instructions reviewed with patient and patient verbalized understanding.  Pt ambulated to lobby with steady gait, and family member to transport pt home.  

## 2012-01-04 NOTE — ED Notes (Signed)
C/o n/v and upper abd pain onset yesterday.

## 2012-05-25 DIAGNOSIS — N879 Dysplasia of cervix uteri, unspecified: Secondary | ICD-10-CM | POA: Insufficient documentation

## 2013-08-01 ENCOUNTER — Other Ambulatory Visit (HOSPITAL_COMMUNITY): Payer: Self-pay | Admitting: Nurse Practitioner

## 2013-08-01 DIAGNOSIS — Z139 Encounter for screening, unspecified: Secondary | ICD-10-CM

## 2013-08-28 ENCOUNTER — Ambulatory Visit (HOSPITAL_COMMUNITY)
Admission: RE | Admit: 2013-08-28 | Discharge: 2013-08-28 | Disposition: A | Discharge status: Home / Self Care | Payer: Self-pay | Source: Ambulatory Visit | Attending: Nurse Practitioner | Admitting: Nurse Practitioner

## 2013-08-28 DIAGNOSIS — Z139 Encounter for screening, unspecified: Secondary | ICD-10-CM | POA: Insufficient documentation

## 2014-01-10 ENCOUNTER — Encounter (HOSPITAL_COMMUNITY): Payer: Self-pay | Admitting: Emergency Medicine

## 2014-01-10 ENCOUNTER — Emergency Department (HOSPITAL_COMMUNITY)
Admission: EM | Admit: 2014-01-10 | Discharge: 2014-01-10 | Disposition: A | Discharge status: Home / Self Care | Payer: Medicaid Other | Attending: Emergency Medicine | Admitting: Emergency Medicine

## 2014-01-10 DIAGNOSIS — K0889 Other specified disorders of teeth and supporting structures: Secondary | ICD-10-CM

## 2014-01-10 DIAGNOSIS — F17200 Nicotine dependence, unspecified, uncomplicated: Secondary | ICD-10-CM | POA: Insufficient documentation

## 2014-01-10 DIAGNOSIS — R51 Headache: Secondary | ICD-10-CM | POA: Insufficient documentation

## 2014-01-10 DIAGNOSIS — Z79899 Other long term (current) drug therapy: Secondary | ICD-10-CM | POA: Insufficient documentation

## 2014-01-10 DIAGNOSIS — K089 Disorder of teeth and supporting structures, unspecified: Secondary | ICD-10-CM | POA: Insufficient documentation

## 2014-01-10 DIAGNOSIS — M542 Cervicalgia: Secondary | ICD-10-CM | POA: Insufficient documentation

## 2014-01-10 DIAGNOSIS — I10 Essential (primary) hypertension: Secondary | ICD-10-CM | POA: Insufficient documentation

## 2014-01-10 DIAGNOSIS — K029 Dental caries, unspecified: Secondary | ICD-10-CM | POA: Insufficient documentation

## 2014-01-10 HISTORY — DX: Essential (primary) hypertension: I10

## 2014-01-10 MED ORDER — AMOXICILLIN 250 MG PO CAPS
500.0000 mg | ORAL_CAPSULE | Freq: Once | ORAL | Status: AC
  Administered 2014-01-10: 500 mg via ORAL
  Filled 2014-01-10: qty 2

## 2014-01-10 MED ORDER — HYDROCODONE-ACETAMINOPHEN 5-325 MG PO TABS
1.0000 | ORAL_TABLET | Freq: Once | ORAL | Status: AC
  Administered 2014-01-10: 1 via ORAL
  Filled 2014-01-10: qty 1

## 2014-01-10 MED ORDER — AMOXICILLIN 500 MG PO CAPS: 500.0000 mg | ORAL_CAPSULE | Freq: Three times a day (TID) | ORAL | Status: DC

## 2014-01-10 MED ORDER — HYDROCODONE-ACETAMINOPHEN 5-325 MG PO TABS: ORAL_TABLET | ORAL | Status: DC

## 2014-01-10 NOTE — Discharge Instructions (Signed)
Dental Pain °Toothache is pain in or around a tooth. It may get worse with chewing or with cold or heat.  °HOME CARE °· Your dentist may use a numbing medicine during treatment. If so, you may need to avoid eating until the medicine wears off. Ask your dentist about this. °· Only take medicine as told by your dentist or doctor. °· Avoid chewing food near the painful tooth until after all treatment is done. Ask your dentist about this. °GET HELP RIGHT AWAY IF:  °· The problem gets worse or new problems appear. °· You have a fever. °· There is redness and puffiness (swelling) of the face, jaw, or neck. °· You cannot open your mouth. °· There is pain in the jaw. °· There is very bad pain that is not helped by medicine. °MAKE SURE YOU:  °· Understand these instructions. °· Will watch your condition. °· Will get help right away if you are not doing well or get worse. °Document Released: 05/25/2008 Document Revised: 02/29/2012 Document Reviewed: 05/25/2008 °[employer]® Patient Information ©2014 [employer]. ° °

## 2014-01-10 NOTE — ED Notes (Signed)
Pt with decaying teeth per pt for awhile, severe pain for past 2 days, denies seeing dentist

## 2014-01-10 NOTE — ED Provider Notes (Signed)
CSN: [id number]     Arrival date & time 01/10/14  1404 History   First MD Initiated Contact with Patient 01/10/14 1414     Chief Complaint  Patient presents with  . Dental Pain   (Consider location/radiation/quality/duration/timing/severity/associated sxs/prior Treatment) Patient is a 43 y.o. female presenting with tooth pain. The history is provided by the patient.  Dental Pain Location:  Lower Lower teeth location:  31/RL 2nd molar and 18/LL 2nd molar Quality:  Dull and shooting Severity:  Severe Onset quality:  Sudden Duration:  2 days Timing:  Constant Progression:  Worsening (Persistent tooth pain for 4 months but states pain has increased x2 days) Chronicity:  Recurrent Context: dental caries and poor dentition   Context: not abscess, not dental fracture, not malocclusion, not recent dental surgery and not trauma   Prior workup: none. Relieved by:  Nothing Worsened by:  Cold food/drink, hot food/drink and pressure Ineffective treatments:  NSAIDs Associated symptoms: facial pain and neck pain   Associated symptoms: no congestion, no difficulty swallowing, no facial swelling, no fever, no gum swelling, no headaches, no neck swelling, no oral bleeding, no oral lesions and no trismus   Risk factors: lack of dental care, periodontal disease and smoking     Past Medical History  Diagnosis Date  . Hypertension    History reviewed. No pertinent past surgical history. History reviewed. No pertinent family history. History  Substance Use Topics  . Smoking status: Current Every Day Smoker    Types: Cigarettes  . Smokeless tobacco: Not on file  . Alcohol Use: Yes     Comment: occ. use   OB History   Grav Para Term Preterm Abortions TAB SAB Ect Mult Living                 Review of Systems  Constitutional: Negative for fever and appetite change.  HENT: Positive for dental problem. Negative for congestion, facial swelling, mouth sores, sore throat and trouble swallowing.     Eyes: Negative for pain and visual disturbance.  Musculoskeletal: Positive for neck pain. Negative for neck stiffness.  Neurological: Negative for dizziness, facial asymmetry and headaches.  Hematological: Negative for adenopathy.  All other systems reviewed and are negative.    Allergies  Review of patient's allergies indicates no known allergies.  Home Medications   Current Outpatient Rx  Name  Route  Sig  Dispense  Refill  . diphenhydrAMINE (BENADRYL) 25 mg capsule   Oral   Take 25 mg by mouth every 6 (six) hours as needed. For allergies         . escitalopram (LEXAPRO) 10 MG tablet   Oral   Take 10 mg by mouth daily.         . ferrous sulfate 325 (65 FE) MG tablet   Oral   Take 325 mg by mouth every other day.         . ibuprofen (ADVIL,MOTRIN) 200 MG tablet   Oral   Take 400 mg by mouth every 6 (six) hours as needed. For headache         . naproxen sodium (ANAPROX) 220 MG tablet   Oral   Take 220 mg by mouth every 8 (eight) hours as needed. For pain         . Norgestimate-Ethinyl Estradiol Triphasic (ORTHO TRI-CYCLEN, 28,) 0.18/0.215/0.25 MG-35 MCG tablet   Oral   Take 1 tablet by mouth daily.         Marland Kitchen EXPIRED: ranitidine (ZANTAC) 150 MG  capsule   Oral   Take 1 capsule (150 mg total) by mouth 2 (two) times daily.   30 capsule   0    BP 149/101  Pulse 119  Temp(Src) 98.2 F (36.8 C) (Oral)  Resp 18  Ht 5\' 2"  (1.575 m)  Wt 145 lb (65.772 kg)  BMI 26.51 kg/m2  SpO2 100%  LMP 12/21/2013 Physical Exam  Nursing note and vitals reviewed. Constitutional: She is oriented to person, place, and time. She appears well-developed and well-nourished. No distress.  HENT:  Head: Normocephalic and atraumatic.  Right Ear: Tympanic membrane and ear canal normal.  Left Ear: Tympanic membrane and ear canal normal.  Mouth/Throat: Uvula is midline, oropharynx is clear and moist and mucous membranes are normal. No trismus in the jaw. Dental caries present. No  dental abscesses or uvula swelling.    Dental caries and ttp of #31 and #18.  No facial swelling, obvious dental abscess, trismus, or sublingual abnml.    Neck: Normal range of motion. Neck supple.  Cardiovascular: Normal rate, regular rhythm and normal heart sounds.   No murmur heard. Pulmonary/Chest: Effort normal and breath sounds normal. No respiratory distress.  Musculoskeletal: Normal range of motion.  Lymphadenopathy:    She has no cervical adenopathy.  Neurological: She is alert and oriented to person, place, and time. She exhibits normal muscle tone. Coordination normal.  Skin: Skin is warm and dry.    ED Course  Procedures (including critical care time) Labs Review Labs Reviewed - No data to display Imaging Review No results found.  EKG Interpretation   None       MDM    Patient reviewed on the [place] narcotics database.  No recent Rx's filled.    No concerning sx's at this time for Ludwig's angina or periapical abscess.  Pt appears stable for d/c.  Given referral info for local dentistry.  I will prescribe amoxil and #15 vicodin with the understanding that she will f/u with a dentist.    Cordarrell Sane L. Trisha Mangleriplett, PA-C 01/10/14 1439

## 2014-01-10 NOTE — ED Notes (Signed)
Pt verbalized understanding of not to drive and use caution after taking vicodin for pain

## 2014-01-10 NOTE — ED Provider Notes (Signed)
  Medical screening examination/treatment/procedure(s) were performed by non-physician practitioner and as supervising physician I was immediately available for consultation/collaboration.      Gerhard Munchobert Nelle Sayed, MD 01/10/14 1534

## 2014-08-02 DIAGNOSIS — Z8719 Personal history of other diseases of the digestive system: Secondary | ICD-10-CM | POA: Insufficient documentation

## 2014-08-02 DIAGNOSIS — E785 Hyperlipidemia, unspecified: Secondary | ICD-10-CM | POA: Insufficient documentation

## 2014-11-26 DIAGNOSIS — B360 Pityriasis versicolor: Secondary | ICD-10-CM | POA: Insufficient documentation

## 2015-03-13 ENCOUNTER — Emergency Department (HOSPITAL_COMMUNITY): Payer: Self-pay

## 2015-03-13 ENCOUNTER — Emergency Department (HOSPITAL_COMMUNITY)
Admission: EM | Admit: 2015-03-13 | Discharge: 2015-03-13 | Disposition: A | Discharge status: Home / Self Care | Payer: Self-pay | Attending: Emergency Medicine | Admitting: Emergency Medicine

## 2015-03-13 ENCOUNTER — Encounter (HOSPITAL_COMMUNITY): Payer: Self-pay

## 2015-03-13 DIAGNOSIS — Z720 Tobacco use: Secondary | ICD-10-CM | POA: Insufficient documentation

## 2015-03-13 DIAGNOSIS — I10 Essential (primary) hypertension: Secondary | ICD-10-CM | POA: Insufficient documentation

## 2015-03-13 DIAGNOSIS — G5601 Carpal tunnel syndrome, right upper limb: Secondary | ICD-10-CM | POA: Insufficient documentation

## 2015-03-13 DIAGNOSIS — Z792 Long term (current) use of antibiotics: Secondary | ICD-10-CM | POA: Insufficient documentation

## 2015-03-13 DIAGNOSIS — Z79899 Other long term (current) drug therapy: Secondary | ICD-10-CM | POA: Insufficient documentation

## 2015-03-13 DIAGNOSIS — Z791 Long term (current) use of non-steroidal anti-inflammatories (NSAID): Secondary | ICD-10-CM | POA: Insufficient documentation

## 2015-03-13 MED ORDER — HYDROCODONE-ACETAMINOPHEN 5-325 MG PO TABS: 1.0000 | ORAL_TABLET | ORAL | Status: DC | PRN

## 2015-03-13 NOTE — ED Provider Notes (Signed)
CSN: [id number]     Arrival date & time 03/13/15  1014 History   First MD Initiated Contact with Patient 03/13/15 1043     Chief Complaint  Patient presents with  . Hand Pain     (Consider location/radiation/quality/duration/timing/severity/associated sxs/prior Treatment) The history is provided by the patient.    Heidi [name] is a 44 y.o. female presenting with a worsening 3 month history of right 3rd and 4th finger pain with radiation into her mid hand and is worsened with flexion/extension of her fingers and is also worsened when she first wakes in the morning and is often accompanied by numbness which resolves after several minutes upon first waking.  She denies injury, denies recent repetitive use and denies neck pain or injury.  She was seen by her pcp and placed on gabapentin in January and also taking ibuprofen 800 mg without relief.     Past Medical History  Diagnosis Date  . Hypertension    History reviewed. No pertinent past surgical history. No family history on file. History  Substance Use Topics  . Smoking status: Current Every Day Smoker    Types: Cigarettes  . Smokeless tobacco: Not on file  . Alcohol Use: Yes     Comment: occ. use   OB History    No data available     Review of Systems  Constitutional: Negative for fever.  Musculoskeletal: Positive for arthralgias. Negative for myalgias and joint swelling.  Neurological: Negative for weakness and numbness.      Allergies  Review of patient's allergies indicates no known allergies.  Home Medications   Prior to Admission medications   Medication Sig Start Date End Date Taking? Authorizing Provider  ferrous sulfate 325 (65 FE) MG tablet Take 325 mg by mouth every other day.   Yes Historical Provider, MD  gabapentin (NEURONTIN) 100 MG capsule Take 200 mg by mouth 2 (two) times daily.   Yes Historical Provider, MD  ibuprofen (ADVIL,MOTRIN) 200 MG tablet Take 400 mg by mouth every 6 (six) hours as  needed. For headache   Yes Historical Provider, MD  Norgestimate-Ethinyl Estradiol Triphasic (ORTHO TRI-CYCLEN, 28,) 0.18/0.215/0.25 MG-35 MCG tablet Take 1 tablet by mouth daily.   Yes Historical Provider, MD  amoxicillin (AMOXIL) 500 MG capsule Take 1 capsule (500 mg total) by mouth 3 (three) times daily. 01/10/14   Tammi Triplett, PA-C  diphenhydrAMINE (BENADRYL) 25 mg capsule Take 25 mg by mouth every 6 (six) hours as needed. For allergies    Historical Provider, MD  escitalopram (LEXAPRO) 10 MG tablet Take 10 mg by mouth daily.    Historical Provider, MD  HYDROcodone-acetaminophen (NORCO/VICODIN) 5-325 MG per tablet Take 1 tablet by mouth every 4 (four) hours as needed. 03/13/15   Burgess AmorJulie Keri Tavella, PA-C  naproxen sodium (ANAPROX) 220 MG tablet Take 220 mg by mouth every 8 (eight) hours as needed. For pain    Historical Provider, MD  ranitidine (ZANTAC) 150 MG capsule Take 1 capsule (150 mg total) by mouth 2 (two) times daily. 01/04/12 01/03/13  Bethann BerkshireJoseph Zammit, MD   BP 139/85 mmHg  Pulse 79  Temp(Src) 98.7 F (37.1 C) (Oral)  Resp 20  Ht 5\' 2"  (1.575 m)  Wt 137 lb (62.143 kg)  BMI 25.05 kg/m2  SpO2 100%  LMP 02/18/2015 Physical Exam  Constitutional: She appears well-developed and well-nourished.  HENT:  Head: Atraumatic.  Neck: Normal range of motion.  Cardiovascular:  Pulses equal bilaterally  Musculoskeletal: She exhibits tenderness. She exhibits no edema.  Right hand: She exhibits tenderness. She exhibits normal range of motion, normal two-point discrimination, normal capillary refill, no deformity and no swelling. Normal sensation noted. She exhibits no finger abduction.  Positive Tinel's sign right.  Fifth finger held in chronic extension secondary to old tendon injury injury.  This in 2 second cap refill in fingertips.  No decreased sensation to fine touch in fingertips.  Fifth finger is chronically numb.    Neurological: She is alert. She has normal strength. She displays normal  reflexes. No sensory deficit.  Skin: Skin is warm and dry.  Psychiatric: She has a normal mood and affect.    ED Course  Procedures (including critical care time) Labs Review Labs Reviewed - No data to display  Imaging Review Dg Hand Complete Right  03/13/2015   CLINICAL DATA:  Right hand pain and swelling of third and fourth fingers.  EXAM: RIGHT HAND - COMPLETE 3+ VIEW  COMPARISON:  None.  FINDINGS: No evidence of fracture, dislocation, bony lesion or erosions. No significant arthropathy is identified. Soft tissues are unremarkable.  IMPRESSION: Normal right hand.   Electronically Signed   By: Irish Lack M.D.   On: 03/13/2015 11:49     EKG Interpretation None      MDM   Final diagnoses:  Carpal tunnel syndrome of right wrist    Patients labs and/or radiological studies were reviewed and considered during the medical decision making and disposition process.  Results were also discussed with patient.  Suspect carpal tunnel syndrome.  Patient was placed in Velcro splint for nighttime use or for any repetitive movement activities.  Heat therapy.  Continue with ibuprofen, added hydrocodone for pain relief.  Plan follow-up with PCP for recheck.  Patient may need Ortho referral if symptoms are not improved.   Burgess Amor, PA-C 03/13/15 1216  Margarita Grizzle, MD 03/13/15 [phone]

## 2015-03-13 NOTE — ED Notes (Signed)
Pt c/o pain in r middle and 4 th fingers radiating into hand x 3 months.  [facility] and was put on ibuprofen and another medication but pt says isn't helping.  Denies injury.

## 2015-03-13 NOTE — Discharge Instructions (Signed)
Carpal Tunnel Syndrome The carpal tunnel is a narrow area located on the palm side of your wrist. The tunnel is formed by the wrist bones and ligaments. Nerves, blood vessels, and tendons pass through the carpal tunnel. Repeated wrist motion or certain diseases may cause swelling within the tunnel. This swelling pinches the main nerve in the wrist (median nerve) and causes the painful hand and arm condition called carpal tunnel syndrome. CAUSES   Repeated wrist motions.  Wrist injuries.  Certain diseases like arthritis, diabetes, alcoholism, hyperthyroidism, and kidney failure.  Obesity.  Pregnancy. SYMPTOMS   A "pins and needles" feeling in your fingers or hand, especially in your thumb, index and middle fingers.  Tingling or numbness in your fingers or hand.  An aching feeling in your entire arm, especially when your wrist and elbow are bent for long periods of time.  Wrist pain that goes up your arm to your shoulder.  Pain that goes down into your palm or fingers.  A weak feeling in your hands. DIAGNOSIS  Your health care provider will take your history and perform a physical exam. An electromyography test may be needed. This test measures electrical signals sent out by your nerves into the muscles. The electrical signals are usually slowed by carpal tunnel syndrome. You may also need X-rays. TREATMENT  Carpal tunnel syndrome may clear up by itself. Your health care provider may recommend a wrist splint or medicine such as a nonsteroidal anti-inflammatory medicine. Cortisone injections may help. Sometimes, surgery may be needed to free the pinched nerve.  HOME CARE INSTRUCTIONS   Take all medicine as directed by your health care provider. Only take over-the-counter or prescription medicines for pain, discomfort, or fever as directed by your health care provider.  If you were given a splint to keep your wrist from bending, wear it as directed. It is important to wear the splint at  night. Wear the splint for as long as you have pain or numbness in your hand, arm, or wrist. This may take 1 to 2 months.  Rest your wrist from any activity that may be causing your pain. If your symptoms are work-related, you may need to talk to your employer about changing to a job that does not require using your wrist.  Put ice on your wrist after long periods of wrist activity.  Put ice in a plastic bag.  Place a towel between your skin and the bag.  Leave the ice on for 15-20 minutes, 03-04 times a day.  Keep all follow-up visits as directed by your health care provider. This includes any orthopedic referrals, physical therapy, and rehabilitation. Any delay in getting necessary care could result in a delay or failure of your condition to heal. SEEK IMMEDIATE MEDICAL CARE IF:   You have new, unexplained symptoms.  Your symptoms get worse and are not helped or controlled with medicines. MAKE SURE YOU:   Understand these instructions.  Will watch your condition.  Will get help right away if you are not doing well or get worse. Document Released: 12/04/2000 Document Revised: 04/23/2014 Document Reviewed: 10/23/2011 Exit[employer] Patient Information 2015 [employer]. This information is not intended to replace advice given to you by your health care provider. Make sure you discuss any questions you have with your health care provider.  

## 2015-05-10 DIAGNOSIS — Z8669 Personal history of other diseases of the nervous system and sense organs: Secondary | ICD-10-CM | POA: Insufficient documentation

## 2015-09-11 ENCOUNTER — Other Ambulatory Visit (HOSPITAL_COMMUNITY): Payer: Self-pay | Admitting: Nurse Practitioner

## 2015-09-11 DIAGNOSIS — R87810 Cervical high risk human papillomavirus (HPV) DNA test positive: Secondary | ICD-10-CM | POA: Insufficient documentation

## 2015-09-11 DIAGNOSIS — Z1231 Encounter for screening mammogram for malignant neoplasm of breast: Secondary | ICD-10-CM

## 2015-09-16 ENCOUNTER — Ambulatory Visit (HOSPITAL_COMMUNITY): Payer: Self-pay

## 2015-11-27 ENCOUNTER — Encounter (HOSPITAL_COMMUNITY): Payer: Self-pay

## 2015-11-27 ENCOUNTER — Emergency Department (HOSPITAL_COMMUNITY)
Admission: EM | Admit: 2015-11-27 | Discharge: 2015-11-27 | Disposition: A | Discharge status: Home / Self Care | Payer: Medicaid Other | Attending: Emergency Medicine | Admitting: Emergency Medicine

## 2015-11-27 DIAGNOSIS — Y998 Other external cause status: Secondary | ICD-10-CM | POA: Diagnosis not present

## 2015-11-27 DIAGNOSIS — F17210 Nicotine dependence, cigarettes, uncomplicated: Secondary | ICD-10-CM | POA: Diagnosis not present

## 2015-11-27 DIAGNOSIS — W540XXA Bitten by dog, initial encounter: Secondary | ICD-10-CM | POA: Diagnosis not present

## 2015-11-27 DIAGNOSIS — Y9289 Other specified places as the place of occurrence of the external cause: Secondary | ICD-10-CM | POA: Insufficient documentation

## 2015-11-27 DIAGNOSIS — Y9389 Activity, other specified: Secondary | ICD-10-CM | POA: Diagnosis not present

## 2015-11-27 DIAGNOSIS — S0185XA Open bite of other part of head, initial encounter: Secondary | ICD-10-CM | POA: Insufficient documentation

## 2015-11-27 DIAGNOSIS — I10 Essential (primary) hypertension: Secondary | ICD-10-CM | POA: Insufficient documentation

## 2015-11-27 DIAGNOSIS — Z23 Encounter for immunization: Secondary | ICD-10-CM | POA: Insufficient documentation

## 2015-11-27 DIAGNOSIS — F419 Anxiety disorder, unspecified: Secondary | ICD-10-CM | POA: Insufficient documentation

## 2015-11-27 MED ORDER — HYDROCODONE-ACETAMINOPHEN 5-325 MG PO TABS: 1.0000 | ORAL_TABLET | ORAL | Status: DC | PRN

## 2015-11-27 MED ORDER — HYDROMORPHONE HCL 1 MG/ML IJ SOLN
1.0000 mg | Freq: Once | INTRAMUSCULAR | Status: AC
  Administered 2015-11-27: 1 mg via INTRAVENOUS
  Filled 2015-11-27: qty 1

## 2015-11-27 MED ORDER — LIDOCAINE-EPINEPHRINE-TETRACAINE (LET) SOLUTION
3.0000 mL | Freq: Once | NASAL | Status: AC
  Administered 2015-11-27: 3 mL via TOPICAL
  Filled 2015-11-27: qty 3

## 2015-11-27 MED ORDER — AMOXICILLIN-POT CLAVULANATE 875-125 MG PO TABS: 1.0000 | ORAL_TABLET | Freq: Two times a day (BID) | ORAL | Status: DC

## 2015-11-27 MED ORDER — TETANUS-DIPHTH-ACELL PERTUSSIS 5-2.5-18.5 LF-MCG/0.5 IM SUSP
0.5000 mL | Freq: Once | INTRAMUSCULAR | Status: AC
  Administered 2015-11-27: 0.5 mL via INTRAMUSCULAR
  Filled 2015-11-27: qty 0.5

## 2015-11-27 MED ORDER — LIDOCAINE-EPINEPHRINE (PF) 1 %-1:200000 IJ SOLN
20.0000 mL | Freq: Once | INTRAMUSCULAR | Status: AC
  Administered 2015-11-27: 20 mL
  Filled 2015-11-27: qty 20

## 2015-11-27 MED ORDER — AMOXICILLIN-POT CLAVULANATE 875-125 MG PO TABS
1.0000 | ORAL_TABLET | Freq: Once | ORAL | Status: AC
  Administered 2015-11-27: 1 via ORAL
  Filled 2015-11-27: qty 1

## 2015-11-27 NOTE — ED Notes (Signed)
Patient to nurses station. States she is leaving.

## 2015-11-27 NOTE — Discharge Instructions (Signed)

## 2015-11-27 NOTE — ED Notes (Signed)
Patient talking on phone yelling/crying.

## 2015-11-27 NOTE — ED Notes (Signed)
When asked patient could I put the pulse ox probe back on finger, patient refused. Patient proceeded to tell writer, "yall need to give me something for pain, yall aint doing shit for me. Im calling my ride, Ive got pain medication at home I can take". Informed patient provider will be in to see her as soon as one is available. Patient refuses to speak to Clinical research associatewriter.
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
## 2015-11-27 NOTE — ED Notes (Signed)
Spoke with [place] [employer] and office Leavy CellaBoyd responded to call and states dog did not have any shots. Per [place] [employer] dog is being held for 10 days.

## 2015-11-27 NOTE — ED Provider Notes (Signed)
CSN: [id number]     Arrival date & time 11/27/15  1812 History   None    Chief Complaint  Patient presents with  . Animal Bite     (Consider location/radiation/quality/duration/timing/severity/associated sxs/prior Treatment) Patient is a 44 y.o. female presenting with animal bite. The history is provided by the patient.  Animal Bite Contact animal:  Dog Location:  Face Facial injury location:  Chin and R cheek Time since incident: just prior to arrival to the ED. Pain details:    Quality:  Stinging and sharp   Severity:  Severe   Timing:  Constant   Progression:  Worsening Incident location:  Another residence Provoked: unprovoked   Notifications:  Animal control and law enforcement Animal's rabies vaccination status:  Never received Animal in possession: yes   Tetanus status:  Out of date Relieved by:  None tried Worsened by:  Nothing tried Ineffective treatments:  None tried  Heidi [name] is a 44 y.o. female who presents to the ED with lacerations to the face after she was bitten by a dog while visiting her neighbors. Patient states she had been around the [place] bull before and it was fine but today he jumped on her. Patient called EMS to bring her to the ED.  Past Medical History  Diagnosis Date  . Hypertension    History reviewed. No pertinent past surgical history. No family history on file. Social History  Substance Use Topics  . Smoking status: Current Every Day Smoker    Types: Cigarettes  . Smokeless tobacco: None  . Alcohol Use: Yes     Comment: occ. use   OB History    No data available     Review of Systems  Skin: Positive for wound.  all other systems negative    Allergies  Review of patient's allergies indicates no known allergies.  Home Medications   Prior to Admission medications   Medication Sig Start Date End Date Taking? Authorizing Provider  amoxicillin-clavulanate (AUGMENTIN) 875-125 MG tablet Take 1 tablet by mouth every 12  (twelve) hours. 11/27/15   Hope Orlene Och, NP  HYDROcodone-acetaminophen (NORCO/VICODIN) 5-325 MG tablet Take 1 tablet by mouth every 4 (four) hours as needed. 11/27/15   Hope Orlene Och, NP   BP 140/78 mmHg  Pulse 101  Temp(Src) 97.8 F (36.6 C) (Oral)  Resp 14  Ht  (1.575 m)  Wt 58.968 kg  BMI 23.77 kg/m2  SpO2 98%  LMP  (LMP Unknown) Physical Exam  Constitutional: She is oriented to person, place, and time. She appears well-developed and well-nourished. No distress.  HENT:  Head:    Laceration to the right cheek 1.5 cm and to the chin 4 cm. Six sutures to the area under the chin and 2 sutures to the right cheek.  Eyes: EOM are normal.  Neck: Neck supple.  Pulmonary/Chest: Effort normal.  Abdominal: Soft. There is no tenderness.  Musculoskeletal: Normal range of motion.  Neurological: She is alert and oriented to person, place, and time. No cranial nerve deficit.  Skin: Skin is warm and dry.  Psychiatric: Her mood appears anxious.  Patient upset, crying and smells of ETOH.  Nursing note and vitals reviewed.   ED Course  .Marland KitchenLaceration Repair Date/Time: 11/27/2015 8:34 PM Performed by: Janne Napoleon Authorized by: Janne Napoleon Consent: Verbal consent obtained. Risks and benefits: risks, benefits and alternatives were discussed Consent given by: patient Patient understanding: patient states understanding of the procedure being performed Required items: required blood  products, implants, devices, and special equipment available Patient identity confirmed: verbally with patient Body area: head/neck Location details: chin Laceration length: 5.5 cm Contamination: The wound is contaminated. Tendon involvement: none Nerve involvement: none Vascular damage: no Anesthesia: local infiltration Local anesthetic: lidocaine 1% with epinephrine and LET (lido,epi,tetracaine) Anesthetic total: 6 ml Patient sedated: no Preparation: Patient was prepped and draped in the usual sterile  fashion. Irrigation solution: saline Irrigation method: syringe Amount of cleaning: extensive Debridement: minimal Degree of undermining: none Skin closure: 5-0 Prolene Number of sutures: 8 Technique: simple Approximation difficulty: simple Dressing: 4x4 sterile gauze Patient tolerance: Patient tolerated the procedure well with no immediate complications Comments: Patient given dilaudid prior to procedure. Tetanus updated. LET applied to wounds prior to procedure.     First dose of augmentin given prior to d/c  Tetanus updated  MDM  44 y.o. female with dog bites to the face stable for d/c. She will continue Augmentin and pain medication. She will follow up in 5 days for suture removal or sooner for any problems. Discussed with the patient and her family member plan of care and all questioned fully answered.   Final diagnoses:  Animal bite of face, initial encounter      Janne NapoleonHope M Neese, NP 11/28/15 [id number]  Derwood KaplanAnkit Nanavati, MD 11/28/15 [phone]
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
## 2015-11-27 NOTE — ED Notes (Signed)
Hope-PA at bedside for suture repair.

## 2016-06-30 DIAGNOSIS — A5400 Gonococcal infection of lower genitourinary tract, unspecified: Secondary | ICD-10-CM | POA: Insufficient documentation

## 2016-09-17 ENCOUNTER — Ambulatory Visit (HOSPITAL_COMMUNITY): Payer: Medicaid Other

## 2017-05-21 ENCOUNTER — Encounter (HOSPITAL_COMMUNITY): Payer: Self-pay | Admitting: Emergency Medicine

## 2017-05-21 ENCOUNTER — Emergency Department (HOSPITAL_COMMUNITY)
Admission: EM | Admit: 2017-05-21 | Discharge: 2017-05-21 | Disposition: A | Discharge status: Home / Self Care | Payer: Medicaid Other | Attending: Emergency Medicine | Admitting: Emergency Medicine

## 2017-05-21 DIAGNOSIS — F17210 Nicotine dependence, cigarettes, uncomplicated: Secondary | ICD-10-CM | POA: Insufficient documentation

## 2017-05-21 DIAGNOSIS — K0889 Other specified disorders of teeth and supporting structures: Secondary | ICD-10-CM

## 2017-05-21 DIAGNOSIS — I10 Essential (primary) hypertension: Secondary | ICD-10-CM | POA: Insufficient documentation

## 2017-05-21 DIAGNOSIS — K047 Periapical abscess without sinus: Secondary | ICD-10-CM | POA: Diagnosis not present

## 2017-05-21 MED ORDER — AMOXICILLIN 250 MG PO CAPS
500.0000 mg | ORAL_CAPSULE | Freq: Once | ORAL | Status: AC
  Administered 2017-05-21: 500 mg via ORAL
  Filled 2017-05-21: qty 2

## 2017-05-21 MED ORDER — NAPROXEN 250 MG PO TABS
500.0000 mg | ORAL_TABLET | Freq: Once | ORAL | Status: AC
  Administered 2017-05-21: 500 mg via ORAL
  Filled 2017-05-21: qty 2

## 2017-05-21 MED ORDER — NAPROXEN 500 MG PO TABS: 500.0000 mg | ORAL_TABLET | Freq: Two times a day (BID) | ORAL | 0 refills | Status: DC

## 2017-05-21 MED ORDER — AMOXICILLIN 500 MG PO CAPS: 500.0000 mg | ORAL_CAPSULE | Freq: Three times a day (TID) | ORAL | 0 refills | Status: AC

## 2017-05-21 NOTE — ED Provider Notes (Signed)
[facility]-EMERGENCY DEPT Provider Note   CSN: [id number] Arrival date & time: 05/21/17  0908     History   Chief Complaint Chief Complaint  Patient presents with  . Dental Pain    HPI Heidi [name] is a 45 y.o. female presenting with acute on chronic dental pain stating she has had increased pain in her right lower second molar for the past week.  She denies any new injuries to the tooth.  She has had long-standing decay in this tooth as documented by a visit here in 2015.  She does have dental care through the health department but states she has not followed up with the recommendation for a root canal with her dentist.  She describes constant aching pain which radiates into her cheek.  She has gingival edema, denies fevers or chills.  She has taken ibuprofen 800 mg without symptom relief.  She denies headache, neck pain or stiffness, has had no difficulty swallowing.  The history is provided by the patient.    Past Medical History:  Diagnosis Date  . Hypertension     Patient Active Problem List   Diagnosis Date Noted  . CERVICALGIA 01/28/2011  . SHOULDER PAIN 10/29/2010  . CERVICAL RADICULOPATHY 10/29/2010    History reviewed. No pertinent surgical history.  OB History    No data available       Home Medications    Prior to Admission medications   Medication Sig Start Date End Date Taking? Authorizing Provider  amoxicillin (AMOXIL) 500 MG capsule Take 1 capsule (500 mg total) by mouth 3 (three) times daily. 05/21/17 05/31/17  Burgess Amor, PA-C  amoxicillin-clavulanate (AUGMENTIN) 875-125 MG tablet Take 1 tablet by mouth every 12 (twelve) hours. 11/27/15   Janne Napoleon, NP  HYDROcodone-acetaminophen (NORCO/VICODIN) 5-325 MG tablet Take 1 tablet by mouth every 4 (four) hours as needed. 11/27/15   Janne Napoleon, NP  naproxen (NAPROSYN) 500 MG tablet Take 1 tablet (500 mg total) by mouth 2 (two) times daily. 05/21/17   Burgess Amor, PA-C    Family History History reviewed. No  pertinent family history.  Social History Social History  Substance Use Topics  . Smoking status: Current Every Day Smoker    Types: Cigarettes  . Smokeless tobacco: Never Used  . Alcohol use Yes     Comment: occ. use     Allergies   Patient has no known allergies.   Review of Systems Review of Systems  Constitutional: Negative for fever.  HENT: Positive for dental problem. Negative for facial swelling and sore throat.   Respiratory: Negative for shortness of breath.   Musculoskeletal: Negative for neck pain and neck stiffness.     Physical Exam Updated Vital Signs BP 134/89 (BP Location: Right Arm)   Pulse 74   Temp 97.6 F (36.4 C) (Oral)   Resp 16   Ht 5\' 3"  (1.6 m)   Wt 58.1 kg (128 lb)   SpO2 100%   BMI 22.67 kg/m   Physical Exam  Constitutional: She is oriented to person, place, and time. She appears well-developed and well-nourished. No distress.  HENT:  Head: Normocephalic and atraumatic.  Right Ear: Tympanic membrane and external ear normal.  Left Ear: Tympanic membrane and external ear normal.  Mouth/Throat: Oropharynx is clear and moist and mucous membranes are normal. No oral lesions. No trismus in the jaw. Dental caries present. No dental abscesses or uvula swelling.    Several dental extractions noted.  Eyes: Conjunctivae are normal.  Neck: Normal range of motion. Neck supple.  Cardiovascular: Normal rate and normal heart sounds.   Pulmonary/Chest: Effort normal.  Abdominal: She exhibits no distension.  Musculoskeletal: Normal range of motion.  Lymphadenopathy:    She has no cervical adenopathy.  Neurological: She is alert and oriented to person, place, and time.  Skin: Skin is warm and dry. No erythema.  Psychiatric: She has a normal mood and affect.     ED Treatments / Results  Labs (all labs ordered are listed, but only abnormal results are displayed) Labs Reviewed - No data to display  EKG  EKG Interpretation None        Radiology No results found.  Procedures Procedures (including critical care time)  Medications Ordered in ED Medications  amoxicillin (AMOXIL) capsule 500 mg (500 mg Oral Given 05/21/17 0930)  naproxen (NAPROSYN) tablet 500 mg (500 mg Oral Given 05/21/17 0930)     Initial Impression / Assessment and Plan / ED Course  I have reviewed the triage vital signs and the nursing notes.  Pertinent labs & imaging results that were available during my care of the patient were reviewed by me and considered in my medical decision making (see chart for details).     Pt without trismus, no evidence for dental abscess or facial soft tissue involvement. Possible early infection given increased pain.  Amoxil, naproxen. Pt has tramadol at home for her arthritis pain, advised can use this for dental pain as well.  Plan f/u with her dentist.  Final Clinical Impressions(s) / ED Diagnoses   Final diagnoses:  Pain, dental  Dental infection    New Prescriptions Discharge Medication List as of 05/21/2017  9:27 AM    START taking these medications   Details  amoxicillin (AMOXIL) 500 MG capsule Take 1 capsule (500 mg total) by mouth 3 (three) times daily., Starting Fri 05/21/2017, Until Mon 05/31/2017, Print    naproxen (NAPROSYN) 500 MG tablet Take 1 tablet (500 mg total) by mouth 2 (two) times daily., Starting Fri 05/21/2017, Print         Burgess AmorIdol, Levell Tavano, PA-C 05/21/17 0935    Loren RacerYelverton, David, MD 05/27/17 [phone]

## 2017-05-21 NOTE — ED Triage Notes (Signed)
Patient complaining of lower right dental pain since last night. States she has history of same.

## 2017-05-21 NOTE — Discharge Instructions (Signed)
Complete your entire course of antibiotics as prescribed.  You  may try your tramadol for pain. I have prescribed naproxen to try in place of your ibuprofen which may work better. Also you may try tylenol in addition to the naproxen.  Avoid applying heat or ice to this abscess area which can worsen your symptoms.  You may use warm salt water swish and spit treatment or half peroxide and water swish and spit after meals to keep this area clean as discussed.  Call the dentist listed above for further management of your symptoms.

## 2017-09-24 DIAGNOSIS — N926 Irregular menstruation, unspecified: Secondary | ICD-10-CM | POA: Insufficient documentation

## 2017-09-24 DIAGNOSIS — K047 Periapical abscess without sinus: Secondary | ICD-10-CM | POA: Insufficient documentation

## 2018-07-03 ENCOUNTER — Emergency Department (HOSPITAL_COMMUNITY): Payer: Self-pay

## 2018-07-03 ENCOUNTER — Other Ambulatory Visit: Payer: Self-pay

## 2018-07-03 ENCOUNTER — Emergency Department (HOSPITAL_COMMUNITY)
Admission: EM | Admit: 2018-07-03 | Discharge: 2018-07-03 | Discharge status: Against Medical Advice | Payer: Self-pay | Attending: Emergency Medicine | Admitting: Emergency Medicine

## 2018-07-03 ENCOUNTER — Encounter (HOSPITAL_COMMUNITY): Payer: Self-pay | Admitting: *Deleted

## 2018-07-03 DIAGNOSIS — S022XXA Fracture of nasal bones, initial encounter for closed fracture: Secondary | ICD-10-CM | POA: Insufficient documentation

## 2018-07-03 DIAGNOSIS — Y999 Unspecified external cause status: Secondary | ICD-10-CM | POA: Insufficient documentation

## 2018-07-03 DIAGNOSIS — Y92007 Garden or yard of unspecified non-institutional (private) residence as the place of occurrence of the external cause: Secondary | ICD-10-CM | POA: Insufficient documentation

## 2018-07-03 DIAGNOSIS — S0990XA Unspecified injury of head, initial encounter: Secondary | ICD-10-CM | POA: Insufficient documentation

## 2018-07-03 DIAGNOSIS — Y9389 Activity, other specified: Secondary | ICD-10-CM | POA: Insufficient documentation
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
## 2018-07-03 NOTE — Discharge Instructions (Addendum)
No fractures of the face or bleeding on the brain. You may use ibuprofen for pain, ice packs for swelling, follow-up with your doctor within 2 days
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
## 2018-09-26 DIAGNOSIS — N9089 Other specified noninflammatory disorders of vulva and perineum: Secondary | ICD-10-CM | POA: Insufficient documentation

## 2019-05-11 DIAGNOSIS — M79641 Pain in right hand: Secondary | ICD-10-CM | POA: Insufficient documentation

## 2019-05-25 ENCOUNTER — Ambulatory Visit: Payer: Medicaid Other | Admitting: Orthopaedic Surgery

## 2019-05-25 ENCOUNTER — Encounter: Payer: Self-pay | Admitting: Orthopaedic Surgery

## 2019-07-23 ENCOUNTER — Encounter (HOSPITAL_COMMUNITY): Payer: Self-pay | Admitting: Emergency Medicine

## 2019-07-23 ENCOUNTER — Emergency Department (HOSPITAL_COMMUNITY): Payer: Self-pay

## 2019-07-23 ENCOUNTER — Emergency Department (HOSPITAL_COMMUNITY)
Admission: EM | Admit: 2019-07-23 | Discharge: 2019-07-23 | Disposition: A | Discharge status: Home / Self Care | Payer: Self-pay | Attending: Emergency Medicine | Admitting: Emergency Medicine

## 2019-07-23 ENCOUNTER — Other Ambulatory Visit: Payer: Self-pay

## 2019-07-23 DIAGNOSIS — M79671 Pain in right foot: Secondary | ICD-10-CM | POA: Insufficient documentation

## 2019-07-23 DIAGNOSIS — Z5321 Procedure and treatment not carried out due to patient leaving prior to being seen by health care provider: Secondary | ICD-10-CM | POA: Insufficient documentation

## 2019-07-23 NOTE — ED Notes (Signed)
Re-inforced dressing to right foot.
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
## 2020-03-23 ENCOUNTER — Other Ambulatory Visit: Payer: Self-pay

## 2020-03-23 ENCOUNTER — Emergency Department (HOSPITAL_COMMUNITY): Payer: No Typology Code available for payment source

## 2020-03-23 ENCOUNTER — Encounter (HOSPITAL_COMMUNITY): Payer: Self-pay | Admitting: *Deleted

## 2020-03-23 ENCOUNTER — Emergency Department (HOSPITAL_COMMUNITY)
Admission: EM | Admit: 2020-03-23 | Discharge: 2020-03-23 | Disposition: A | Discharge status: Home / Self Care | Payer: No Typology Code available for payment source | Attending: Emergency Medicine | Admitting: Emergency Medicine

## 2020-03-23 DIAGNOSIS — F17210 Nicotine dependence, cigarettes, uncomplicated: Secondary | ICD-10-CM | POA: Diagnosis not present

## 2020-03-23 DIAGNOSIS — M25511 Pain in right shoulder: Secondary | ICD-10-CM | POA: Insufficient documentation

## 2020-03-23 DIAGNOSIS — S3992XA Unspecified injury of lower back, initial encounter: Secondary | ICD-10-CM | POA: Diagnosis present

## 2020-03-23 DIAGNOSIS — Y939 Activity, unspecified: Secondary | ICD-10-CM | POA: Diagnosis not present

## 2020-03-23 DIAGNOSIS — Z79899 Other long term (current) drug therapy: Secondary | ICD-10-CM | POA: Diagnosis not present

## 2020-03-23 DIAGNOSIS — S39012A Strain of muscle, fascia and tendon of lower back, initial encounter: Secondary | ICD-10-CM | POA: Diagnosis not present

## 2020-03-23 DIAGNOSIS — Y999 Unspecified external cause status: Secondary | ICD-10-CM | POA: Insufficient documentation

## 2020-03-23 DIAGNOSIS — Y92410 Unspecified street and highway as the place of occurrence of the external cause: Secondary | ICD-10-CM | POA: Diagnosis not present

## 2020-03-23 DIAGNOSIS — I10 Essential (primary) hypertension: Secondary | ICD-10-CM | POA: Insufficient documentation

## 2020-03-23 MED ORDER — METHOCARBAMOL 500 MG PO TABS: 500.0000 mg | ORAL_TABLET | Freq: Three times a day (TID) | ORAL | 0 refills | Status: DC | PRN

## 2020-03-23 MED ORDER — METHOCARBAMOL 500 MG PO TABS
500.0000 mg | ORAL_TABLET | Freq: Once | ORAL | Status: AC
  Administered 2020-03-23: 17:00:00 500 mg via ORAL
  Filled 2020-03-23: qty 1

## 2020-03-23 MED ORDER — ACETAMINOPHEN 500 MG PO TABS
500.0000 mg | ORAL_TABLET | Freq: Once | ORAL | Status: AC
  Administered 2020-03-23: 500 mg via ORAL
  Filled 2020-03-23: qty 1
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
## 2020-12-04 DIAGNOSIS — Z30013 Encounter for initial prescription of injectable contraceptive: Secondary | ICD-10-CM | POA: Diagnosis not present

## 2020-12-04 DIAGNOSIS — Z114 Encounter for screening for human immunodeficiency virus [HIV]: Secondary | ICD-10-CM | POA: Diagnosis not present

## 2020-12-04 DIAGNOSIS — Z716 Tobacco abuse counseling: Secondary | ICD-10-CM | POA: Diagnosis not present

## 2020-12-04 DIAGNOSIS — Z01419 Encounter for gynecological examination (general) (routine) without abnormal findings: Secondary | ICD-10-CM | POA: Diagnosis not present

## 2020-12-04 DIAGNOSIS — Z0389 Encounter for observation for other suspected diseases and conditions ruled out: Secondary | ICD-10-CM | POA: Diagnosis not present

## 2020-12-04 DIAGNOSIS — Z1388 Encounter for screening for disorder due to exposure to contaminants: Secondary | ICD-10-CM | POA: Diagnosis not present

## 2020-12-04 DIAGNOSIS — Z3009 Encounter for other general counseling and advice on contraception: Secondary | ICD-10-CM | POA: Diagnosis not present

## 2020-12-04 DIAGNOSIS — Z113 Encounter for screening for infections with a predominantly sexual mode of transmission: Secondary | ICD-10-CM | POA: Diagnosis not present

## 2020-12-19 DIAGNOSIS — Z3042 Encounter for surveillance of injectable contraceptive: Secondary | ICD-10-CM | POA: Diagnosis not present

## 2020-12-19 DIAGNOSIS — A749 Chlamydial infection, unspecified: Secondary | ICD-10-CM | POA: Diagnosis not present

## 2021-02-19 DIAGNOSIS — Z3042 Encounter for surveillance of injectable contraceptive: Secondary | ICD-10-CM | POA: Diagnosis not present

## 2021-05-07 DIAGNOSIS — Z3042 Encounter for surveillance of injectable contraceptive: Secondary | ICD-10-CM | POA: Diagnosis not present

## 2021-08-05 DIAGNOSIS — Z3042 Encounter for surveillance of injectable contraceptive: Secondary | ICD-10-CM | POA: Diagnosis not present

## 2021-10-22 DIAGNOSIS — Z3042 Encounter for surveillance of injectable contraceptive: Secondary | ICD-10-CM | POA: Diagnosis not present

## 2021-11-26 DIAGNOSIS — Z1152 Encounter for screening for COVID-19: Secondary | ICD-10-CM | POA: Diagnosis not present

## 2022-01-27 ENCOUNTER — Other Ambulatory Visit (HOSPITAL_COMMUNITY): Payer: Self-pay | Admitting: Nurse Practitioner

## 2022-01-27 DIAGNOSIS — Z113 Encounter for screening for infections with a predominantly sexual mode of transmission: Secondary | ICD-10-CM | POA: Diagnosis not present

## 2022-01-27 DIAGNOSIS — Z01419 Encounter for gynecological examination (general) (routine) without abnormal findings: Secondary | ICD-10-CM | POA: Diagnosis not present

## 2022-01-27 DIAGNOSIS — Z114 Encounter for screening for human immunodeficiency virus [HIV]: Secondary | ICD-10-CM | POA: Diagnosis not present

## 2022-01-27 DIAGNOSIS — B9689 Other specified bacterial agents as the cause of diseases classified elsewhere: Secondary | ICD-10-CM | POA: Insufficient documentation

## 2022-01-27 DIAGNOSIS — Z1231 Encounter for screening mammogram for malignant neoplasm of breast: Secondary | ICD-10-CM

## 2022-01-27 DIAGNOSIS — Z3042 Encounter for surveillance of injectable contraceptive: Secondary | ICD-10-CM | POA: Diagnosis not present

## 2022-02-04 ENCOUNTER — Ambulatory Visit (HOSPITAL_COMMUNITY): Payer: Medicaid Other

## 2022-03-02 ENCOUNTER — Inpatient Hospital Stay (HOSPITAL_COMMUNITY): Admission: RE | Admit: 2022-03-02 | Payer: Medicaid Other | Source: Ambulatory Visit

## 2022-04-02 DIAGNOSIS — Z1152 Encounter for screening for COVID-19: Secondary | ICD-10-CM | POA: Diagnosis not present

## 2022-04-15 DIAGNOSIS — Z1152 Encounter for screening for COVID-19: Secondary | ICD-10-CM | POA: Diagnosis not present

## 2022-04-17 DIAGNOSIS — Z20822 Contact with and (suspected) exposure to covid-19: Secondary | ICD-10-CM | POA: Diagnosis not present

## 2022-04-20 DIAGNOSIS — Z113 Encounter for screening for infections with a predominantly sexual mode of transmission: Secondary | ICD-10-CM | POA: Diagnosis not present

## 2022-04-20 DIAGNOSIS — N760 Acute vaginitis: Secondary | ICD-10-CM | POA: Diagnosis not present

## 2022-04-20 DIAGNOSIS — Z1388 Encounter for screening for disorder due to exposure to contaminants: Secondary | ICD-10-CM | POA: Diagnosis not present

## 2022-04-20 DIAGNOSIS — Z3009 Encounter for other general counseling and advice on contraception: Secondary | ICD-10-CM | POA: Diagnosis not present

## 2022-04-20 DIAGNOSIS — Z0389 Encounter for observation for other suspected diseases and conditions ruled out: Secondary | ICD-10-CM | POA: Diagnosis not present

## 2022-04-20 DIAGNOSIS — Z3042 Encounter for surveillance of injectable contraceptive: Secondary | ICD-10-CM | POA: Diagnosis not present

## 2022-04-20 DIAGNOSIS — R030 Elevated blood-pressure reading, without diagnosis of hypertension: Secondary | ICD-10-CM | POA: Diagnosis not present

## 2022-04-22 DIAGNOSIS — Z1152 Encounter for screening for COVID-19: Secondary | ICD-10-CM | POA: Diagnosis not present

## 2022-04-30 DIAGNOSIS — Z1152 Encounter for screening for COVID-19: Secondary | ICD-10-CM | POA: Diagnosis not present

## 2022-06-11 ENCOUNTER — Other Ambulatory Visit (HOSPITAL_COMMUNITY): Payer: Self-pay | Admitting: *Deleted

## 2022-06-11 DIAGNOSIS — Z1231 Encounter for screening mammogram for malignant neoplasm of breast: Secondary | ICD-10-CM

## 2022-06-15 ENCOUNTER — Ambulatory Visit (HOSPITAL_COMMUNITY): Payer: Medicaid Other

## 2022-07-07 DIAGNOSIS — Z3042 Encounter for surveillance of injectable contraceptive: Secondary | ICD-10-CM | POA: Diagnosis not present

## 2022-09-23 DIAGNOSIS — Z3042 Encounter for surveillance of injectable contraceptive: Secondary | ICD-10-CM | POA: Diagnosis not present

## 2022-09-23 DIAGNOSIS — L089 Local infection of the skin and subcutaneous tissue, unspecified: Secondary | ICD-10-CM | POA: Diagnosis not present

## 2022-10-19 ENCOUNTER — Observation Stay (HOSPITAL_COMMUNITY)
Admission: EM | Admit: 2022-10-19 | Discharge: 2022-10-20 | Disposition: A | Discharge status: Home / Self Care | Payer: Self-pay | Attending: Family Medicine | Admitting: Family Medicine

## 2022-10-19 ENCOUNTER — Encounter (HOSPITAL_COMMUNITY): Payer: Self-pay

## 2022-10-19 ENCOUNTER — Other Ambulatory Visit: Payer: Self-pay

## 2022-10-19 DIAGNOSIS — R112 Nausea with vomiting, unspecified: Secondary | ICD-10-CM | POA: Diagnosis present

## 2022-10-19 DIAGNOSIS — R1110 Vomiting, unspecified: Secondary | ICD-10-CM | POA: Diagnosis present

## 2022-10-19 DIAGNOSIS — Z79899 Other long term (current) drug therapy: Secondary | ICD-10-CM | POA: Insufficient documentation

## 2022-10-19 DIAGNOSIS — F10239 Alcohol dependence with withdrawal, unspecified: Principal | ICD-10-CM | POA: Insufficient documentation

## 2022-10-19 DIAGNOSIS — Z1152 Encounter for screening for COVID-19: Secondary | ICD-10-CM | POA: Insufficient documentation

## 2022-10-19 DIAGNOSIS — Z7982 Long term (current) use of aspirin: Secondary | ICD-10-CM | POA: Insufficient documentation

## 2022-10-19 DIAGNOSIS — I10 Essential (primary) hypertension: Secondary | ICD-10-CM | POA: Diagnosis present

## 2022-10-19 DIAGNOSIS — F10939 Alcohol use, unspecified with withdrawal, unspecified: Secondary | ICD-10-CM | POA: Diagnosis present

## 2022-10-19 DIAGNOSIS — F10930 Alcohol use, unspecified with withdrawal, uncomplicated: Secondary | ICD-10-CM

## 2022-10-19 DIAGNOSIS — F17210 Nicotine dependence, cigarettes, uncomplicated: Secondary | ICD-10-CM | POA: Insufficient documentation

## 2022-10-19 LAB — RAPID URINE DRUG SCREEN, HOSP PERFORMED
Amphetamines: NOT DETECTED
Barbiturates: NOT DETECTED
Benzodiazepines: NOT DETECTED
Cocaine: NOT DETECTED
Opiates: NOT DETECTED
Tetrahydrocannabinol: NOT DETECTED

## 2022-10-19 LAB — COMPREHENSIVE METABOLIC PANEL
ALT: 100 U/L — ABNORMAL HIGH (ref 0–44)
AST: 131 U/L — ABNORMAL HIGH (ref 15–41)
Albumin: 5.1 g/dL — ABNORMAL HIGH (ref 3.5–5.0)
Alkaline Phosphatase: 87 U/L (ref 38–126)
Anion gap: 12 (ref 5–15)
BUN: 7 mg/dL (ref 6–20)
CO2: 23 mmol/L (ref 22–32)
Calcium: 10.8 mg/dL — ABNORMAL HIGH (ref 8.9–10.3)
Chloride: 110 mmol/L (ref 98–111)
Creatinine, Ser: 0.46 mg/dL (ref 0.44–1.00)
GFR, Estimated: >60 mL/min (ref >60)
Glucose, Bld: 125 mg/dL — ABNORMAL HIGH (ref 70–99)
Potassium: 3.8 mmol/L (ref 3.5–5.1)
Sodium: 145 mmol/L (ref 135–145)
Total Bilirubin: 1.2 mg/dL (ref 0.3–1.2)
Total Protein: 9.4 g/dL — ABNORMAL HIGH (ref 6.5–8.1)

## 2022-10-19 LAB — CBC
HCT: 37.7 % (ref 36.0–46.0)
Hemoglobin: 12.7 g/dL (ref 12.0–15.0)
MCH: 30.5 pg (ref 26.0–34.0)
MCHC: 33.7 g/dL (ref 30.0–36.0)
MCV: 90.6 fL (ref 80.0–100.0)
Platelets: 75 10*3/uL — ABNORMAL LOW (ref 150–400)
RBC: 4.16 MIL/uL (ref 3.87–5.11)
RDW: 14.1 % (ref 11.5–15.5)
WBC: 6.2 10*3/uL (ref 4.0–10.5)
nRBC: 0 % (ref 0.0–0.2)

## 2022-10-19 LAB — URINALYSIS, ROUTINE W REFLEX MICROSCOPIC
Bilirubin Urine: NEGATIVE
Glucose, UA: NEGATIVE mg/dL
Ketones, ur: 5 mg/dL — AB
Leukocytes,Ua: NEGATIVE
Nitrite: NEGATIVE
Protein, ur: 100 mg/dL — AB
Specific Gravity, Urine: 1.024 (ref 1.005–1.030)
pH: 6 (ref 5.0–8.0)

## 2022-10-19 LAB — RESP PANEL BY RT-PCR (FLU A&B, COVID) ARPGX2
Influenza A by PCR: NEGATIVE
Influenza B by PCR: NEGATIVE
SARS Coronavirus 2 by RT PCR: NEGATIVE

## 2022-10-19 LAB — LIPASE, BLOOD: Lipase: 34 U/L (ref 11–51)

## 2022-10-19 LAB — ETHANOL: Alcohol, Ethyl (B): <10 mg/dL (ref <10)

## 2022-10-19 LAB — PHOSPHORUS: Phosphorus: 3.6 mg/dL (ref 2.5–4.6)

## 2022-10-19 LAB — I-STAT BETA HCG BLOOD, ED (MC, WL, AP ONLY): I-stat hCG, quantitative: <5 m[IU]/mL (ref <5)

## 2022-10-19 LAB — MAGNESIUM: Magnesium: 1.5 mg/dL — ABNORMAL LOW (ref 1.7–2.4)

## 2022-10-19 MED ORDER — POLYETHYLENE GLYCOL 3350 17 G PO PACK: 17.0000 g | PACK | Freq: Every day | ORAL | Status: DC | PRN

## 2022-10-19 MED ORDER — THIAMINE HCL 100 MG/ML IJ SOLN
100.0000 mg | Freq: Every day | INTRAMUSCULAR | Status: DC
  Administered 2022-10-19: 100 mg via INTRAVENOUS
  Filled 2022-10-19: qty 2

## 2022-10-19 MED ORDER — LORAZEPAM 2 MG/ML IJ SOLN
1.0000 mg | INTRAMUSCULAR | Status: DC | PRN
  Administered 2022-10-19: 2 mg via INTRAVENOUS
  Filled 2022-10-19: qty 1

## 2022-10-19 MED ORDER — ACETAMINOPHEN 325 MG PO TABS: 650.0000 mg | ORAL_TABLET | Freq: Four times a day (QID) | ORAL | Status: DC | PRN

## 2022-10-19 MED ORDER — LORAZEPAM 2 MG/ML IJ SOLN
0.0000 mg | Freq: Four times a day (QID) | INTRAMUSCULAR | Status: DC
  Administered 2022-10-20: 2 mg via INTRAVENOUS
  Filled 2022-10-19: qty 1

## 2022-10-19 MED ORDER — ONDANSETRON HCL 4 MG PO TABS: 4.0000 mg | ORAL_TABLET | Freq: Four times a day (QID) | ORAL | Status: DC | PRN

## 2022-10-19 MED ORDER — ADULT MULTIVITAMIN W/MINERALS CH
1.0000 | ORAL_TABLET | Freq: Every day | ORAL | Status: DC
  Administered 2022-10-19 – 2022-10-20 (×2): 1 via ORAL
  Filled 2022-10-19 (×2): qty 1

## 2022-10-19 MED ORDER — ENOXAPARIN SODIUM 40 MG/0.4ML IJ SOSY
40.0000 mg | PREFILLED_SYRINGE | INTRAMUSCULAR | Status: DC
  Administered 2022-10-20: 40 mg via SUBCUTANEOUS
  Filled 2022-10-19: qty 0.4

## 2022-10-19 MED ORDER — THIAMINE MONONITRATE 100 MG PO TABS
100.0000 mg | ORAL_TABLET | Freq: Every day | ORAL | Status: DC
  Administered 2022-10-20: 100 mg via ORAL
  Filled 2022-10-19: qty 1

## 2022-10-19 MED ORDER — FOLIC ACID 1 MG PO TABS
1.0000 mg | ORAL_TABLET | Freq: Every day | ORAL | Status: DC
  Administered 2022-10-19 – 2022-10-20 (×2): 1 mg via ORAL
  Filled 2022-10-19 (×2): qty 1

## 2022-10-19 MED ORDER — POTASSIUM CHLORIDE IN NACL 20-0.45 MEQ/L-% IV SOLN: INTRAVENOUS | Status: DC

## 2022-10-19 MED ORDER — LORAZEPAM 2 MG/ML IJ SOLN
2.0000 mg | Freq: Once | INTRAMUSCULAR | Status: AC
  Administered 2022-10-19: 2 mg via INTRAVENOUS
  Filled 2022-10-19: qty 1

## 2022-10-19 MED ORDER — SODIUM CHLORIDE 0.9 % IV BOLUS
1000.0000 mL | Freq: Once | INTRAVENOUS | Status: AC
  Administered 2022-10-19: 1000 mL via INTRAVENOUS

## 2022-10-19 MED ORDER — LORAZEPAM 1 MG PO TABS: 1.0000 mg | ORAL_TABLET | ORAL | Status: DC | PRN

## 2022-10-19 MED ORDER — LORAZEPAM 2 MG/ML IJ SOLN: 0.0000 mg | Freq: Two times a day (BID) | INTRAMUSCULAR | Status: DC

## 2022-10-19 MED ORDER — MAGNESIUM SULFATE 2 GM/50ML IV SOLN
2.0000 g | Freq: Once | INTRAVENOUS | Status: AC
  Administered 2022-10-19: 2 g via INTRAVENOUS
  Filled 2022-10-19: qty 50

## 2022-10-19 MED ORDER — ONDANSETRON HCL 4 MG/2ML IJ SOLN: 4.0000 mg | Freq: Four times a day (QID) | INTRAMUSCULAR | Status: DC | PRN

## 2022-10-19 MED ORDER — LORAZEPAM 2 MG/ML IJ SOLN
1.0000 mg | INTRAMUSCULAR | Status: DC | PRN
  Administered 2022-10-20: 1 mg via INTRAVENOUS
  Filled 2022-10-19: qty 1

## 2022-10-19 MED ORDER — ONDANSETRON 4 MG PO TBDP
4.0000 mg | ORAL_TABLET | Freq: Once | ORAL | Status: AC | PRN
  Administered 2022-10-19: 4 mg via ORAL
  Filled 2022-10-19: qty 1

## 2022-10-19 MED ORDER — CHLORDIAZEPOXIDE HCL 25 MG PO CAPS
50.0000 mg | ORAL_CAPSULE | Freq: Once | ORAL | Status: AC
  Administered 2022-10-19: 50 mg via ORAL
  Filled 2022-10-19: qty 2

## 2022-10-19 MED ORDER — ACETAMINOPHEN 650 MG RE SUPP: 650.0000 mg | Freq: Four times a day (QID) | RECTAL | Status: DC | PRN

## 2022-10-19 MED ORDER — METOCLOPRAMIDE HCL 5 MG/ML IJ SOLN
10.0000 mg | Freq: Once | INTRAMUSCULAR | Status: AC
  Administered 2022-10-19: 10 mg via INTRAVENOUS
  Filled 2022-10-19: qty 2

## 2022-10-19 MED ORDER — LACTATED RINGERS IV BOLUS
1000.0000 mL | Freq: Once | INTRAVENOUS | Status: AC
  Administered 2022-10-19: 1000 mL via INTRAVENOUS

## 2022-10-19 NOTE — Assessment & Plan Note (Signed)
Magnesium 1.5.  Likely from alcohol abuse.  Potassium 3.8. -Replete  -check Phos

## 2022-10-19 NOTE — ED Notes (Signed)
EDP at bedside to assess.   

## 2022-10-19 NOTE — Assessment & Plan Note (Signed)
Blood pressure elevated to 160s.  Not on medication.

## 2022-10-19 NOTE — ED Triage Notes (Signed)
Pt presents to ED with complaints of emesis since this am, chills started this am.

## 2022-10-19 NOTE — Assessment & Plan Note (Signed)
Vomiting of 1 day.  No abdominal pain or tenderness, benign abdominal exam, no diarrhea.  Afebrile without leukocytosis.  Possibly alcoholic gastritis, viral etiology. -Hold off on imaging for now. -Zofran as needed -Clear liquid diet

## 2022-10-19 NOTE — H&P (Signed)
History and Physical    Heidi [name] CHE:[id number] DOB: February 27, 1971 DOA: 10/19/2022  PCP: Kizzie Furnish D., PA-C   Patient coming from: Home  I have personally briefly reviewed patient's old medical records in [facility]  Chief Complaint: Vomiting  HPI: Heidi [name] is a 51 y.o. female with medical history significant for alcohol abuse, hypertension. Patient presented to the ED with complaints of vomiting up to 6 episodes that started yesterday morning. She denies prior episodes of vomiting, denies cannabinoid use.  No abdominal pain.  No diarrhea.  No Fever no chills.  No cough no difficulty breathing. She drinks 6 pack of beer daily.  No liquor.  Last drink was yesterday at about 6 to 7 PM.  She tried drinking a count today but vomited.  She denies history of withdrawal seizures or admissions for alcohol withdrawal, but she reports she has the shakes when she does not drink.  ED Course: Temperature 98.  Heart rate 71-111.  Respirate rate 11-20.  Blood pressure systolic 140s to 536R.  O2 sat greater than 96% on room air.  Blood alcohol level less than 10.  Magnesium 1.5.  Potassium 3.8. 2 L bolus given.  Hospitalist to admit for alcohol withdrawal.  Review of Systems: As per HPI all other systems reviewed and negative.  Past Medical History:  Diagnosis Date   Hypertension     History reviewed. No pertinent surgical history.   reports that she has been smoking cigarettes. She has a 10.00 pack-year smoking history. She has never used smokeless tobacco. She reports current alcohol use. She reports that she does not use drugs.  No Known Allergies  Family history of hypertension  Prior to Admission medications   Medication Sig Start Date End Date Taking? Authorizing Provider  aspirin-sod bicarb-citric acid (ALKA-SELTZER) 325 MG TBEF tablet Take 325 mg by mouth every 6 (six) hours as needed.    [provider]  methocarbamol (ROBAXIN) 500 MG tablet Take 1  tablet (500 mg total) by mouth every 8 (eight) hours as needed for muscle spasms. 03/23/20   Benjiman Core, MD    Physical Exam: Vitals:   10/19/22 1700 10/19/22 1715 10/19/22 1806 10/19/22 2028  BP: (!) 161/94   (!) 144/92  Pulse: (!) 111 77  83  Resp: 15 17  20   Temp:   97.7 F (36.5 C) 97.7 F (36.5 C)  TempSrc:   Oral   SpO2: 97% 99%  99%  Weight:      Height:        Constitutional: Initially sleeping, easily arousable, became mildly agitated, tremulous Vitals:   10/19/22 1700 10/19/22 1715 10/19/22 1806 10/19/22 2028  BP: (!) 161/94   (!) 144/92  Pulse: (!) 111 77  83  Resp: 15 17  20   Temp:   97.7 F (36.5 C) 97.7 F (36.5 C)  TempSrc:   Oral   SpO2: 97% 99%  99%  Weight:      Height:       Eyes: PERRL, lids and conjunctivae normal ENMT: Mucous membranes are dry.  Neck: normal, supple, no masses, no thyromegaly Respiratory: clear to auscultation bilaterally, no wheezing, no crackles. Normal respiratory effort. No accessory muscle use.  Cardiovascular: Regular rate and rhythm, no murmurs / rubs / gallops. No extremity edema. 2+ pedal pulses. No carotid bruits.  Abdomen: no tenderness, no masses palpated. No hepatosplenomegaly.  Musculoskeletal: no clubbing / cyanosis. No joint deformity upper and lower extremities.  Tremulous Skin: no rashes,  lesions, ulcers. No induration Neurologic: No apparent cranial nerve abnormality, neck Smitty spontaneously. Psychiatric: Normal judgment and insight. Alert and oriented x 3. Normal mood.   Labs on Admission: I have personally reviewed following labs and imaging studies  CBC: Recent Labs  Lab 10/19/22 1356  WBC 6.2  HGB 12.7  HCT 37.7  MCV 90.6  PLT 75*   Basic Metabolic Panel: Recent Labs  Lab 10/19/22 1356  NA 145  K 3.8  CL 110  CO2 23  GLUCOSE 125*  BUN 7  CREATININE 0.46  CALCIUM 10.8*  MG 1.5*   GFR: Estimated Creatinine Clearance: 70.4 mL/min (by C-G formula based on SCr of 0.46 mg/dL). Liver  Function Tests: Recent Labs  Lab 10/19/22 1356  AST 131*  ALT 100*  ALKPHOS 87  BILITOT 1.2  PROT 9.4*  ALBUMIN 5.1*   Recent Labs  Lab 10/19/22 1356  LIPASE 34    Radiological Exams on Admission: No results found.  EKG: Independently reviewed.  Sinus rhythm rate 93, QTc 465.  Notched P waves diffusely.  No prior to compare.  Assessment/Plan Principal Problem:   Alcohol withdrawal (HCC) Active Problems:   Vomiting   HTN (hypertension)   Hypomagnesemia   Assessment and Plan: * Alcohol withdrawal (HCC) Tremulous, agitated.  Drinks 6 packs of alcohol daily, last drink was yesterday evening.  Denies auditory or visual hallucination at this time.  No history of withdrawal seizures DTs.  Reports alcohol withdrawal shakes.  -CIWA as needed and scheduled -Counselled extensively on quitting alcohol abuse -2 L bolus given, continue N/s+ 20 KCl 100cc/hr x 20hrs - Mag-1.5, repleted  - check Phos - Thiamine folate multivitamins  Vomiting Vomiting of 1 day.  No abdominal pain or tenderness, benign abdominal exam, no diarrhea.  Afebrile without leukocytosis.  Possibly alcoholic gastritis, viral etiology. -Hold off on imaging for now. -Zofran as needed -Clear liquid diet  Hypomagnesemia Magnesium 1.5.  Likely from alcohol abuse.  Potassium 3.8. -Replete  -check Phos  HTN (hypertension) Blood pressure elevated to 160s.  Not on medication.   DVT prophylaxis: Lovenox Code Status: Full code Family Communication: None at bedside Disposition Plan:  ~ 2 days Consults called: none Admission status:  obs tele    Author: Bethena Roys, MD 10/19/2022 10:14 PM  For on call review [email or web address].

## 2022-10-19 NOTE — ED Provider Notes (Signed)
[facility] EMERGENCY DEPARTMENT Provider Note   CSN: [id number] Arrival date & time: 10/19/22  1305     History  Chief Complaint  Patient presents with   Emesis    Heidi [name] is a 51 y.o. female who presents to the ED complaining of emesis onset this morning.  Denies sick contacts.  Pt notes that she has diaphoresis, chills, nausea. Denies sick contacts. No new foods or medications. Denies illicit drug use. Pt notes that she drinks a 6 pack of beer daily with her last use being yesterday. She notes that she tried to drink a beer today, however, threw it up. Denies history of DTs or alcohol withdrawal seizures. No meds tried PTA. Denies rhinorrhea, nasal congestion, fever, chest pain, cough, shortness of breath, abdominal pain, diarrhea, constipation, urinary symptoms.    The history is provided by the patient. No language interpreter was used.       Home Medications Prior to Admission medications   Medication Sig Start Date End Date Taking? Authorizing Provider  aspirin-sod bicarb-citric acid (ALKA-SELTZER) 325 MG TBEF tablet Take 325 mg by mouth every 6 (six) hours as needed.    [provider]  methocarbamol (ROBAXIN) 500 MG tablet Take 1 tablet (500 mg total) by mouth every 8 (eight) hours as needed for muscle spasms. 03/23/20   Davonna Belling, MD      Allergies    Patient has no known allergies.    Review of Systems   Review of Systems  Constitutional:  Positive for chills and diaphoresis. Negative for fever.  HENT:  Negative for congestion and rhinorrhea.   Respiratory:  Negative for cough and shortness of breath.   Cardiovascular:  Negative for chest pain.  Gastrointestinal:  Positive for nausea and vomiting. Negative for abdominal pain, constipation and diarrhea.  Genitourinary:  Negative for dysuria and hematuria.  All other systems reviewed and are negative.   Physical Exam Updated Vital Signs BP (!) 151/110 (BP Location: Right Arm)   Pulse 81    Temp 98 F (36.7 C) (Oral)   Resp 18   Ht 5\' 3"  (1.6 m)   Wt 59 kg   SpO2 96%   BMI 23.03 kg/m  Physical Exam Vitals and nursing note reviewed.  Constitutional:      General: She is not in acute distress.    Appearance: She is diaphoretic.  HENT:     Head: Normocephalic and atraumatic.     Mouth/Throat:     Pharynx: No oropharyngeal exudate.  Eyes:     General: No scleral icterus.    Conjunctiva/sclera: Conjunctivae normal.  Cardiovascular:     Rate and Rhythm: Normal rate and regular rhythm.     Pulses: Normal pulses.     Heart sounds: Normal heart sounds.  Pulmonary:     Effort: Pulmonary effort is normal. No respiratory distress.     Breath sounds: Normal breath sounds. No wheezing.  Abdominal:     General: Bowel sounds are normal.     Palpations: Abdomen is soft. There is no mass.     Tenderness: There is no abdominal tenderness. There is no guarding or rebound.  Musculoskeletal:        General: Normal range of motion.     Cervical back: Normal range of motion and neck supple.  Skin:    General: Skin is warm.  Neurological:     Mental Status: She is alert.  Psychiatric:        Behavior: Behavior normal.  ED Results / Procedures / Treatments   Labs (all labs ordered are listed, but only abnormal results are displayed) Labs Reviewed  COMPREHENSIVE METABOLIC PANEL - Abnormal; Notable for the following components:      Result Value   Glucose, Bld 125 (*)    Calcium 10.8 (*)    Total Protein 9.4 (*)    Albumin 5.1 (*)    AST 131 (*)    ALT 100 (*)    All other components within normal limits  CBC - Abnormal; Notable for the following components:   Platelets 75 (*)    All other components within normal limits  MAGNESIUM - Abnormal; Notable for the following components:   Magnesium 1.5 (*)    All other components within normal limits  RESP PANEL BY RT-PCR (FLU A&B, COVID) ARPGX2  LIPASE, BLOOD  ETHANOL  URINALYSIS, ROUTINE W REFLEX MICROSCOPIC   RAPID URINE DRUG SCREEN, HOSP PERFORMED  I-STAT BETA HCG BLOOD, ED ([facility], [facility], [facility] ONLY)    EKG None  Radiology No results found.  Procedures Procedures    Medications Ordered in ED Medications  LORazepam (ATIVAN) tablet 1-4 mg ( Oral See Alternative 10/19/22 1756)    Or  LORazepam (ATIVAN) injection 1-4 mg (2 mg Intravenous Given 10/19/22 1756)  thiamine (VITAMIN B1) tablet 100 mg ( Oral See Alternative 10/19/22 1701)    Or  thiamine (VITAMIN B1) injection 100 mg (100 mg Intravenous Given [id number])  folic acid (FOLVITE) tablet 1 mg (has no administration in time range)  multivitamin with minerals tablet 1 tablet (has no administration in time range)  magnesium sulfate IVPB 2 g 50 mL (2 g Intravenous New Bag/Given 10/19/22 1803)  ondansetron (ZOFRAN-ODT) disintegrating tablet 4 mg (4 mg Oral Given 10/19/22 1400)  sodium chloride 0.9 % bolus 1,000 mL (0 mLs Intravenous Stopped 10/19/22 1631)  metoCLOPramide (REGLAN) injection 10 mg (10 mg Intravenous Given 10/19/22 1700)  chlordiazePOXIDE (LIBRIUM) capsule 50 mg (50 mg Oral Given 10/19/22 1700)  lactated ringers bolus 1,000 mL (1,000 mLs Intravenous New Bag/Given 10/19/22 1702)  LORazepam (ATIVAN) injection 2 mg (2 mg Intravenous Given 10/19/22 1659)    ED Course/ Medical Decision Making/ A&P Clinical Course as of 10/19/22 1858  Mon Oct 19, 2022  1645 Pt denies ever suffering from DTs or withdrawal seizures.  Patient notes that she typically drinks a sixpack daily.  Notes that she had a 6 ounce beer prior to arrival however notes that she had emesis following. [SB]  1843 Consult with hospitalist, Dr. Denton Brick who agrees with admission at this time.  [SB]    Clinical Course User Index [SB] Ethne Jeon A, PA-C                           Medical Decision Making Amount and/or Complexity of Data Reviewed Labs: ordered.  Risk OTC drugs. Prescription drug management. Decision regarding hospitalization.   Pt presents  with concerns for emesis onset this morning.  Patient notes that she drinks 6 beers daily with her last beer yesterday.  Attempted to have a BM this morning however throughout afterwards.  Denies history of DTs or alcohol withdrawal seizures.  No meds tried prior to arrival.  Patient afebrile.  On exam patient with diaphoresis, tremulous throughout.  No acute cardiovascular, respiratory, down exam findings.  Differential diagnosis includes alcohol withdrawal seizures, DTs, electrolyte abnormality.    Labs:  I ordered, and personally interpreted labs.  The pertinent results include:  Urinalysis and UDS ordered with results pending at time of admission. COVID and flu swabs negative. Ethanol undetectable. Magnesium slightly decreased at 1.5 CBC without leukocytosis, platelets decreased at 75 CMP with elevated AST and ALT at 131 and 100 respectively, slightly elevated glucose at 125 otherwise unremarkable. Lipase unremarkable   Medications:  I ordered medication including Librium, Reglan, Ativan, IV fluids, Zofran, magnesium, thiamine for symptom management Reevaluation of the patient after these medicines and interventions, I reevaluated the patient and found that they have improved I have reviewed the patients home medicines and have made adjustments as needed    Consultations: I requested consultation with the Hospitalist, Dr. Denton Brick And discussed lab and imaging findings as well as pertinent plan - they recommend: Admission   Disposition: Presentation suspicious for alcohol withdrawal.  Doubt electrolyte abnormality, dehydration, DTs, withdrawal seizures at this time. After consideration of the diagnostic results and the patients response to treatment, I feel that the patient would benefit from Admission to the hospital.  Case discussed with attending who evaluated patient and agrees with admission at this time.  Discussed with patient plans for admission to the hospital.  Patient  agreeable at this time.  Patient appears safe for admission at this time.  This chart was dictated using voice recognition software, Dragon. Despite the best efforts of this provider to proofread and correct errors, errors may still occur which can change documentation meaning.  Final Clinical Impression(s) / ED Diagnoses Final diagnoses:  Nausea and vomiting, unspecified vomiting type  Alcohol withdrawal syndrome with complication [facility])    Rx / DC Orders ED Discharge Orders     None         Savan Ruta A, PA-C 10/19/22 1859    Fransico Meadow, MD 10/22/22 1114

## 2022-10-19 NOTE — Assessment & Plan Note (Addendum)
Tremulous, agitated.  Drinks 6 packs of alcohol daily, last drink was yesterday evening.  Denies auditory or visual hallucination at this time.  No history of withdrawal seizures DTs.  Reports alcohol withdrawal shakes.  -CIWA as needed and scheduled -Counselled extensively on quitting alcohol abuse -2 L bolus given, continue N/s+ 20 KCl 100cc/hr x 20hrs - Mag-1.5, repleted  - check Phos - Thiamine folate multivitamins

## 2022-10-20 LAB — CBC
HCT: 32.7 % — ABNORMAL LOW (ref 36.0–46.0)
Hemoglobin: 11.2 g/dL — ABNORMAL LOW (ref 12.0–15.0)
MCH: 31.5 pg (ref 26.0–34.0)
MCHC: 34.3 g/dL (ref 30.0–36.0)
MCV: 91.9 fL (ref 80.0–100.0)
Platelets: 67 10*3/uL — ABNORMAL LOW (ref 150–400)
RBC: 3.56 MIL/uL — ABNORMAL LOW (ref 3.87–5.11)
RDW: 13.8 % (ref 11.5–15.5)
WBC: 4.9 10*3/uL (ref 4.0–10.5)
nRBC: 0 % (ref 0.0–0.2)

## 2022-10-20 LAB — BASIC METABOLIC PANEL
Anion gap: 9 (ref 5–15)
BUN: 10 mg/dL (ref 6–20)
CO2: 23 mmol/L (ref 22–32)
Calcium: 9.7 mg/dL (ref 8.9–10.3)
Chloride: 107 mmol/L (ref 98–111)
Creatinine, Ser: 0.55 mg/dL (ref 0.44–1.00)
GFR, Estimated: >60 mL/min (ref >60)
Glucose, Bld: 115 mg/dL — ABNORMAL HIGH (ref 70–99)
Potassium: 3.1 mmol/L — ABNORMAL LOW (ref 3.5–5.1)
Sodium: 139 mmol/L (ref 135–145)

## 2022-10-20 LAB — HIV ANTIBODY (ROUTINE TESTING W REFLEX): HIV Screen 4th Generation wRfx: NONREACTIVE

## 2022-10-20 MED ORDER — LACTATED RINGERS IV SOLN: INTRAVENOUS | Status: DC

## 2022-10-20 MED ORDER — CHLORDIAZEPOXIDE HCL 5 MG PO CAPS
10.0000 mg | ORAL_CAPSULE | Freq: Three times a day (TID) | ORAL | Status: DC
  Administered 2022-10-20: 10 mg via ORAL
  Filled 2022-10-20: qty 2

## 2022-10-20 MED ORDER — ONDANSETRON HCL 4 MG PO TABS: 4.0000 mg | ORAL_TABLET | Freq: Three times a day (TID) | ORAL | 0 refills | Status: DC | PRN

## 2022-10-20 MED ORDER — ADULT MULTIVITAMIN W/MINERALS CH: 1.0000 | ORAL_TABLET | Freq: Every day | ORAL | Status: DC

## 2022-10-20 MED ORDER — OMEPRAZOLE MAGNESIUM 20 MG PO TBEC: 20.0000 mg | DELAYED_RELEASE_TABLET | Freq: Every day | ORAL | 0 refills | Status: DC

## 2022-10-20 MED ORDER — POTASSIUM CHLORIDE CRYS ER 20 MEQ PO TBCR
40.0000 meq | EXTENDED_RELEASE_TABLET | Freq: Once | ORAL | Status: AC
  Administered 2022-10-20: 40 meq via ORAL
  Filled 2022-10-20: qty 2

## 2022-10-20 MED ORDER — FOLIC ACID 1 MG PO TABS: 1.0000 mg | ORAL_TABLET | Freq: Every day | ORAL | 2 refills | Status: DC

## 2022-10-20 MED ORDER — VITAMIN B-1 100 MG PO TABS: 100.0000 mg | ORAL_TABLET | Freq: Every day | ORAL | 2 refills | Status: DC

## 2022-10-20 NOTE — Discharge Instructions (Signed)
IMPORTANT INFORMATION: PAY CLOSE ATTENTION   PHYSICIAN DISCHARGE INSTRUCTIONS  Follow with Primary care provider  Heidi [name], Heidi Journey., PA-C  and other consultants as instructed by your Hospitalist Physician  [place] IF SYMPTOMS COME BACK, WORSEN OR NEW PROBLEM DEVELOPS   Please note: You were cared for by a hospitalist during your hospital stay. Every effort will be made to forward records to your primary care provider.  You can request that your primary care provider send for your hospital records if they have not received them.  Once you are discharged, your primary care physician will handle any further medical issues. Please note that NO REFILLS for any discharge medications will be authorized once you are discharged, as it is imperative that you return to your primary care physician (or establish a relationship with a primary care physician if you do not have one) for your post hospital discharge needs so that they can reassess your need for medications and monitor your lab values.  Please get a complete blood count and chemistry panel checked by your Primary MD at your next visit, and again as instructed by your Primary MD.  Get Medicines reviewed and adjusted: Please take all your medications with you for your next visit with your Primary MD  Laboratory/radiological data: Please request your Primary MD to go over all hospital tests and procedure/radiological results at the follow up, please ask your primary care provider to get all Hospital records sent to his/her office.  In some cases, they will be blood work, cultures and biopsy results pending at the time of your discharge. Please request that your primary care provider follow up on these results.  If you are diabetic, please bring your blood sugar readings with you to your follow up appointment with primary care.    Please call and make your follow up appointments as soon as possible.    Also  Note the following: If you experience worsening of your admission symptoms, develop shortness of breath, life threatening emergency, suicidal or homicidal thoughts you must seek medical attention immediately by calling 911 or calling your MD immediately  if symptoms less severe.  You must read complete instructions/literature along with all the possible adverse reactions/side effects for all the Medicines you take and that have been prescribed to you. Take any new Medicines after you have completely understood and accpet all the possible adverse reactions/side effects.   Do not drive when taking Pain medications or sleeping medications (Benzodiazepines)  Do not take more than prescribed Pain, Sleep and Anxiety Medications. It is not advisable to combine anxiety,sleep and pain medications without talking with your primary care practitioner  Special Instructions: If you have smoked or chewed Tobacco  in the last 2 yrs please stop smoking, stop any regular Alcohol  and or any Recreational drug use.  Wear Seat belts while driving.  Do not drive if taking any narcotic, mind altering or controlled substances or recreational drugs or alcohol.

## 2022-10-20 NOTE — Progress Notes (Signed)
Patient received from ED alert and oriented to person place, and time. Patient CIWA score= 6 medicated once for tremors, patient rested quietly through night with no c/o pain, hallucinations, or auditory disturbances. Pateitn currently resting quietly with call bell in reach.

## 2022-10-20 NOTE — Progress Notes (Signed)
Ng Discharge Note  Admit Date:  10/19/2022 Discharge date: 10/20/2022   Heidi [name] to be D/C'd Home per MD order.  AVS completed. Patient/caregiver able to verbalize understanding.  Discharge Medication: Allergies as of 10/20/2022   No Known Allergies      Medication List     TAKE these medications    folic acid 1 MG tablet Commonly known as: FOLVITE Take 1 tablet (1 mg total) by mouth daily. Start taking on: October 21, 2022   multivitamin with minerals Tabs tablet Take 1 tablet by mouth daily. Start taking on: October 21, 2022   omeprazole 20 MG tablet Commonly known as: PriLOSEC OTC Take 1 tablet (20 mg total) by mouth daily.   ondansetron 4 MG tablet Commonly known as: Zofran Take 1 tablet (4 mg total) by mouth every 8 (eight) hours as needed for nausea or vomiting.   thiamine 100 MG tablet Commonly known as: Vitamin B-1 Take 1 tablet (100 mg total) by mouth daily. Start taking on: October 21, 2022        Discharge Assessment: Vitals:   10/20/22 0441 10/20/22 0804  BP: 132/81 (!) 148/105  Pulse: 94 88  Resp: 16   Temp: (!) 97.3 F (36.3 C) 98.6 F (37 C)  SpO2: 100% 98%   Skin clean, dry and intact without evidence of skin break down, no evidence of skin tears noted. IV catheter discontinued intact. Site without signs and symptoms of complications - no redness or edema noted at insertion site, patient denies c/o pain - only slight tenderness at site.  Dressing with slight pressure applied.  D/c Instructions-Education: Discharge instructions given to patient/family with verbalized understanding. D/c education completed with patient/family including follow up instructions, medication list, d/c activities limitations if indicated, with other d/c instructions as indicated by MD - patient able to verbalize understanding, all questions fully answered. Patient instructed to return to ED, call 911, or call MD for any changes in condition.  Patient  escorted via [place], and D/C home via private auto.  Tsosie Billing, LPN 81/27/5170 0:17 PM

## 2022-10-20 NOTE — Discharge Summary (Addendum)
Physician Discharge Summary  Heidi [name] PZW:[id number] DOB: 13-Jun-1971 DOA: 10/19/2022  PCP: Royce Macadamia D., PA-C  Admit date: 10/19/2022 Discharge date: 10/20/2022  Admitted From:  Home  Disposition: Home   Recommendations for Outpatient Follow-up:  Follow up with PCP in 1 weeks Outpatient alcohol abuse treatment program recommended   Discharge Condition: STABLE   CODE STATUS: FULL DIET: soft foods    Brief Hospitalization Summary: Please see all hospital notes, images, labs for full details of the hospitalization. 51 y.o. female with medical history significant for alcohol abuse, hypertension. Patient presented to the ED with complaints of vomiting up to 6 episodes that started yesterday morning. She denies prior episodes of vomiting, denies cannabinoid use.  No abdominal pain.  No diarrhea.  No Fever no chills.  No cough no difficulty breathing. She drinks 6 pack of beer daily.  No liquor.  Last drink was yesterday at about 6 to 7 PM.  She tried drinking a count today but vomited.  She denies history of withdrawal seizures or admissions for alcohol withdrawal, but she reports she has the shakes when she does not drink.   ED Course: Temperature 98.  Heart rate 71-111.  Respirate rate 11-20.  Blood pressure systolic 423N to 361W.  O2 sat greater than 96% on room air.  Blood alcohol level less than 10.  Magnesium 1.5.  Potassium 3.8. 2 L bolus given.  Hospitalist to admit for alcohol withdrawal.   Pt was admitted for alcohol abuse and nausea and vomiting thought secondary to alcohol abuse and was treated with IV fluids, electrolyte replacement and supportive measures.  She had some tremulousness but no severe alcohol withdrawal. She was counseled on cessation but declined outpatient alcohol treatment at this time. She is feeling better, tolerating soft diet and wanting to go home.  She is discharged home today.    Discharge Diagnoses:  Principal Problem:   Alcohol withdrawal  ([place]) Active Problems:   HTN (hypertension)   Vomiting   Hypomagnesemia   Discharge Instructions:  Allergies as of 10/20/2022   No Known Allergies      Medication List     TAKE these medications    folic acid 1 MG tablet Commonly known as: FOLVITE Take 1 tablet (1 mg total) by mouth daily. Start taking on: October 21, 2022   multivitamin with minerals Tabs tablet Take 1 tablet by mouth daily. Start taking on: October 21, 2022   omeprazole 20 MG tablet Commonly known as: PriLOSEC OTC Take 1 tablet (20 mg total) by mouth daily.   ondansetron 4 MG tablet Commonly known as: Zofran Take 1 tablet (4 mg total) by mouth every 8 (eight) hours as needed for nausea or vomiting.   thiamine 100 MG tablet Commonly known as: Vitamin B-1 Take 1 tablet (100 mg total) by mouth daily. Start taking on: October 21, 2022        Follow-up Information     Muse, [name]., PA-C. Schedule an appointment as soon as possible for a visit in 1 week(s).   Why: Hospital Follow Up Contact information: [address] [place] [address] [phone]                No Known Allergies Allergies as of 10/20/2022   No Known Allergies      Medication List     TAKE these medications    folic acid 1 MG tablet Commonly known as: FOLVITE Take 1 tablet (1 mg total) by mouth  daily. Start taking on: October 21, 2022   multivitamin with minerals Tabs tablet Take 1 tablet by mouth daily. Start taking on: October 21, 2022   omeprazole 20 MG tablet Commonly known as: PriLOSEC OTC Take 1 tablet (20 mg total) by mouth daily.   ondansetron 4 MG tablet Commonly known as: Zofran Take 1 tablet (4 mg total) by mouth every 8 (eight) hours as needed for nausea or vomiting.   thiamine 100 MG tablet Commonly known as: Vitamin B-1 Take 1 tablet (100 mg total) by mouth daily. Start taking on: October 21, 2022        Procedures/Studies: No results found.    Subjective: Pt is tolerating her diet now, she is on a soft diet.  She is wanting to go home.    Discharge Exam: Vitals:   10/20/22 0441 10/20/22 0804  BP: 132/81 (!) 148/105  Pulse: 94 88  Resp: 16   Temp: (!) 97.3 F (36.3 C) 98.6 F (37 C)  SpO2: 100% 98%   Vitals:   10/19/22 2028 10/19/22 2357 10/20/22 0441 10/20/22 0804  BP: (!) 144/92 (!) 157/96 132/81 (!) 148/105  Pulse: 83 (!) 109 94 88  Resp: 20 18 16    Temp: 97.7 F (36.5 C) (!) 97 F (36.1 C) (!) 97.3 F (36.3 C) 98.6 F (37 C)  TempSrc:      SpO2: 99% 100% 100% 98%  Weight: 62.2 kg     Height:       General: Pt is alert, awake, not in acute distress Cardiovascular: RRR, S1/S2 +, no rubs, no gallops Respiratory: CTA bilaterally, no wheezing, no rhonchi Abdominal: Soft, NT, ND, bowel sounds + Extremities: no edema, no cyanosis   The results of significant diagnostics from this hospitalization (including imaging, microbiology, ancillary and laboratory) are listed below for reference.     Microbiology: Recent Results (from the past 240 hour(s))  Resp Panel by RT-PCR (Flu A&B, Covid) Anterior Nasal Swab     Status: None   Collection Time: 10/19/22  3:02 PM   Specimen: Anterior Nasal Swab  Result Value Ref Range Status   SARS Coronavirus 2 by RT PCR NEGATIVE NEGATIVE Final    Comment: (NOTE) SARS-CoV-2 target nucleic acids are NOT DETECTED.  The SARS-CoV-2 RNA is generally detectable in upper respiratory specimens during the acute phase of infection. The lowest concentration of SARS-CoV-2 viral copies this assay can detect is 138 copies/mL. A negative result does not preclude SARS-Cov-2 infection and should not be used as the sole basis for treatment or other patient management decisions. A negative result may occur with  improper specimen collection/handling, submission of specimen other than nasopharyngeal swab, presence of viral mutation(s) within the areas targeted by this assay, and inadequate  number of viral copies(<138 copies/mL). A negative result must be combined with clinical observations, patient history, and epidemiological information. The expected result is Negative.  Fact Sheet for Patients:  10/21/22  Fact Sheet for Healthcare Providers:  BloggerCourse.com  This test is no t yet approved or cleared by the [place] FDA and  has been authorized for detection and/or diagnosis of SARS-CoV-2 by FDA under an Emergency Use Authorization (EUA). This EUA will remain  in effect (meaning this test can be used) for the duration of the COVID-19 declaration under Section 564(b)(1) of the Act, 21 U.S.C.section 360bbb-3(b)(1), unless the authorization is terminated  or revoked sooner.       Influenza A by PCR NEGATIVE NEGATIVE Final   Influenza B by  PCR NEGATIVE NEGATIVE Final    Comment: (NOTE) The Xpert Xpress SARS-CoV-2/FLU/RSV plus assay is intended as an aid in the diagnosis of influenza from Nasopharyngeal swab specimens and should not be used as a sole basis for treatment. Nasal washings and aspirates are unacceptable for Xpert Xpress SARS-CoV-2/FLU/RSV testing.  Fact Sheet for Patients: BloggerCourse.com  Fact Sheet for Healthcare Providers: SeriousBroker.it  This test is not yet approved or cleared by the [place] FDA and has been authorized for detection and/or diagnosis of SARS-CoV-2 by FDA under an Emergency Use Authorization (EUA). This EUA will remain in effect (meaning this test can be used) for the duration of the COVID-19 declaration under Section 564(b)(1) of the Act, 21 U.S.C. section 360bbb-3(b)(1), unless the authorization is terminated or revoked.  Performed at [facility], [address] [place], [place] [address]      Labs: BNP (last 3 results) No results for input(s): "BNP" in the last 8760 hours. Basic Metabolic  Panel: Recent Labs  Lab 10/19/22 1356 10/20/22 0409  NA 145 139  K 3.8 3.1*  CL 110 107  CO2 23 23  GLUCOSE 125* 115*  BUN 7 10  CREATININE 0.46 0.55  CALCIUM 10.8* 9.7  MG 1.5*  --   PHOS 3.6  --    Liver Function Tests: Recent Labs  Lab 10/19/22 1356  AST 131*  ALT 100*  ALKPHOS 87  BILITOT 1.2  PROT 9.4*  ALBUMIN 5.1*   Recent Labs  Lab 10/19/22 1356  LIPASE 34   No results for input(s): "AMMONIA" in the last 168 hours. CBC: Recent Labs  Lab 10/19/22 1356 10/20/22 0409  WBC 6.2 4.9  HGB 12.7 11.2*  HCT 37.7 32.7*  MCV 90.6 91.9  PLT 75* 67*   Cardiac Enzymes: No results for input(s): "CKTOTAL", "CKMB", "CKMBINDEX", "TROPONINI" in the last 168 hours. BNP: Invalid input(s): "POCBNP" CBG: No results for input(s): "GLUCAP" in the last 168 hours. D-Dimer No results for input(s): "DDIMER" in the last 72 hours. Hgb A1c No results for input(s): "HGBA1C" in the last 72 hours. Lipid Profile No results for input(s): "CHOL", "HDL", "LDLCALC", "TRIG", "CHOLHDL", "LDLDIRECT" in the last 72 hours. Thyroid function studies No results for input(s): "TSH", "T4TOTAL", "T3FREE", "THYROIDAB" in the last 72 hours.  Invalid input(s): "FREET3" Anemia work up No results for input(s): "VITAMINB12", "FOLATE", "FERRITIN", "TIBC", "IRON", "RETICCTPCT" in the last 72 hours. Urinalysis    Component Value Date/Time   COLORURINE YELLOW 10/19/2022 1630   APPEARANCEUR HAZY (A) 10/19/2022 1630   LABSPEC 1.024 10/19/2022 1630   PHURINE 6.0 10/19/2022 1630   GLUCOSEU NEGATIVE 10/19/2022 1630   HGBUR MODERATE (A) 10/19/2022 1630   BILIRUBINUR NEGATIVE 10/19/2022 1630   KETONESUR 5 (A) 10/19/2022 1630   PROTEINUR 100 (A) 10/19/2022 1630   UROBILINOGEN 0.2 01/04/2012 1330   NITRITE NEGATIVE 10/19/2022 1630   LEUKOCYTESUR NEGATIVE 10/19/2022 1630   Sepsis Labs Recent Labs  Lab 10/19/22 1356 10/20/22 0409  WBC 6.2 4.9   Microbiology Recent Results (from the past 240  hour(s))  Resp Panel by RT-PCR (Flu A&B, Covid) Anterior Nasal Swab     Status: None   Collection Time: 10/19/22  3:02 PM   Specimen: Anterior Nasal Swab  Result Value Ref Range Status   SARS Coronavirus 2 by RT PCR NEGATIVE NEGATIVE Final    Comment: (NOTE) SARS-CoV-2 target nucleic acids are NOT DETECTED.  The SARS-CoV-2 RNA is generally detectable in upper respiratory specimens during the acute phase of infection. The lowest  concentration of SARS-CoV-2 viral copies this assay can detect is 138 copies/mL. A negative result does not preclude SARS-Cov-2 infection and should not be used as the sole basis for treatment or other patient management decisions. A negative result may occur with  improper specimen collection/handling, submission of specimen other than nasopharyngeal swab, presence of viral mutation(s) within the areas targeted by this assay, and inadequate number of viral copies(<138 copies/mL). A negative result must be combined with clinical observations, patient history, and epidemiological information. The expected result is Negative.  Fact Sheet for Patients:  BloggerCourse.com  Fact Sheet for Healthcare Providers:  [employer]iousBroker.it  This test is no t yet approved or cleared by the [place] FDA and  has been authorized for detection and/or diagnosis of SARS-CoV-2 by FDA under an Emergency Use Authorization (EUA). This EUA will remain  in effect (meaning this test can be used) for the duration of the COVID-19 declaration under Section 564(b)(1) of the Act, 21 U.S.C.section 360bbb-3(b)(1), unless the authorization is terminated  or revoked sooner.       Influenza A by PCR NEGATIVE NEGATIVE Final   Influenza B by PCR NEGATIVE NEGATIVE Final    Comment: (NOTE) The Xpert Xpress SARS-CoV-2/FLU/RSV plus assay is intended as an aid in the diagnosis of influenza from Nasopharyngeal swab specimens and should not be  used as a sole basis for treatment. Nasal washings and aspirates are unacceptable for Xpert Xpress SARS-CoV-2/FLU/RSV testing.  Fact Sheet for Patients: BloggerCourse.com  Fact Sheet for Healthcare Providers: SeriousBroker.it  This test is not yet approved or cleared by the [place] FDA and has been authorized for detection and/or diagnosis of SARS-CoV-2 by FDA under an Emergency Use Authorization (EUA). This EUA will remain in effect (meaning this test can be used) for the duration of the COVID-19 declaration under Section 564(b)(1) of the Act, 21 U.S.C. section 360bbb-3(b)(1), unless the authorization is terminated or revoked.  Performed at [facility], [address] [place], [place] [address]     Time coordinating discharge:   SIGNED:  Standley Dakins, MD  [facility] 10/20/2022, 2:14 PM How to contact the [facility] Attending or Consulting provider 7A - 7P or covering provider during after hours 7P -7A, for this patient?  Check the care team in [facility] and look for a) attending/consulting TR[facility] provider listed and b) the [facility] team listed Log into [email or web address] and use [facility] universal password to access. If you do not have the password, please contact the hospital operator. Locate the [facility] provider you are looking for under [facility] and page to a number that you can be directly reached. If you still have difficulty reaching the provider, please page the [facility] (Director on Call) for the Hospitalists listed on amion for assistance.

## 2022-10-28 DIAGNOSIS — Z5989 Other problems related to housing and economic circumstances: Secondary | ICD-10-CM | POA: Insufficient documentation

## 2022-11-20 DIAGNOSIS — Z419 Encounter for procedure for purposes other than remedying health state, unspecified: Secondary | ICD-10-CM | POA: Diagnosis not present

## 2022-12-09 DIAGNOSIS — Z3042 Encounter for surveillance of injectable contraceptive: Secondary | ICD-10-CM | POA: Diagnosis not present

## 2022-12-21 DIAGNOSIS — Z419 Encounter for procedure for purposes other than remedying health state, unspecified: Secondary | ICD-10-CM | POA: Diagnosis not present

## 2023-01-21 DIAGNOSIS — Z419 Encounter for procedure for purposes other than remedying health state, unspecified: Secondary | ICD-10-CM | POA: Diagnosis not present

## 2023-02-19 DIAGNOSIS — Z419 Encounter for procedure for purposes other than remedying health state, unspecified: Secondary | ICD-10-CM | POA: Diagnosis not present

## 2023-03-01 ENCOUNTER — Other Ambulatory Visit (HOSPITAL_COMMUNITY): Payer: Self-pay | Admitting: Nurse Practitioner

## 2023-03-01 DIAGNOSIS — Z01419 Encounter for gynecological examination (general) (routine) without abnormal findings: Secondary | ICD-10-CM | POA: Diagnosis not present

## 2023-03-01 DIAGNOSIS — Z1231 Encounter for screening mammogram for malignant neoplasm of breast: Secondary | ICD-10-CM

## 2023-03-08 ENCOUNTER — Ambulatory Visit (HOSPITAL_COMMUNITY): Payer: Medicaid Other

## 2023-03-22 DIAGNOSIS — Z419 Encounter for procedure for purposes other than remedying health state, unspecified: Secondary | ICD-10-CM | POA: Diagnosis not present

## 2023-04-05 ENCOUNTER — Encounter (HOSPITAL_COMMUNITY): Payer: Self-pay | Admitting: Emergency Medicine

## 2023-04-05 ENCOUNTER — Emergency Department (HOSPITAL_COMMUNITY): Payer: Medicaid Other

## 2023-04-05 ENCOUNTER — Other Ambulatory Visit: Payer: Self-pay

## 2023-04-05 ENCOUNTER — Inpatient Hospital Stay (HOSPITAL_COMMUNITY)
Admission: EM | Admit: 2023-04-05 | Discharge: 2023-04-15 | DRG: 897 | Disposition: A | Discharge status: Home Health | Payer: Medicaid Other | Attending: Internal Medicine | Admitting: Internal Medicine

## 2023-04-05 DIAGNOSIS — G4700 Insomnia, unspecified: Secondary | ICD-10-CM | POA: Diagnosis present

## 2023-04-05 DIAGNOSIS — K219 Gastro-esophageal reflux disease without esophagitis: Secondary | ICD-10-CM | POA: Diagnosis not present

## 2023-04-05 DIAGNOSIS — E876 Hypokalemia: Secondary | ICD-10-CM | POA: Diagnosis present

## 2023-04-05 DIAGNOSIS — F17210 Nicotine dependence, cigarettes, uncomplicated: Secondary | ICD-10-CM | POA: Diagnosis present

## 2023-04-05 DIAGNOSIS — I10 Essential (primary) hypertension: Secondary | ICD-10-CM | POA: Diagnosis present

## 2023-04-05 DIAGNOSIS — R569 Unspecified convulsions: Secondary | ICD-10-CM | POA: Diagnosis not present

## 2023-04-05 DIAGNOSIS — F10939 Alcohol use, unspecified with withdrawal, unspecified: Principal | ICD-10-CM | POA: Diagnosis present

## 2023-04-05 DIAGNOSIS — F10239 Alcohol dependence with withdrawal, unspecified: Secondary | ICD-10-CM | POA: Diagnosis not present

## 2023-04-05 DIAGNOSIS — F17200 Nicotine dependence, unspecified, uncomplicated: Secondary | ICD-10-CM | POA: Insufficient documentation

## 2023-04-05 DIAGNOSIS — Z79899 Other long term (current) drug therapy: Secondary | ICD-10-CM

## 2023-04-05 DIAGNOSIS — K7010 Alcoholic hepatitis without ascites: Secondary | ICD-10-CM | POA: Diagnosis present

## 2023-04-05 DIAGNOSIS — R000 Tachycardia, unspecified: Secondary | ICD-10-CM | POA: Diagnosis present

## 2023-04-05 DIAGNOSIS — F32A Depression, unspecified: Secondary | ICD-10-CM | POA: Diagnosis present

## 2023-04-05 DIAGNOSIS — K760 Fatty (change of) liver, not elsewhere classified: Secondary | ICD-10-CM | POA: Diagnosis present

## 2023-04-05 DIAGNOSIS — F10931 Alcohol use, unspecified with withdrawal delirium: Secondary | ICD-10-CM | POA: Diagnosis not present

## 2023-04-05 DIAGNOSIS — R339 Retention of urine, unspecified: Secondary | ICD-10-CM | POA: Diagnosis present

## 2023-04-05 DIAGNOSIS — D6959 Other secondary thrombocytopenia: Secondary | ICD-10-CM | POA: Diagnosis present

## 2023-04-05 DIAGNOSIS — R7401 Elevation of levels of liver transaminase levels: Secondary | ICD-10-CM | POA: Diagnosis not present

## 2023-04-05 DIAGNOSIS — R945 Abnormal results of liver function studies: Secondary | ICD-10-CM | POA: Diagnosis not present

## 2023-04-05 DIAGNOSIS — R0600 Dyspnea, unspecified: Secondary | ICD-10-CM | POA: Diagnosis not present

## 2023-04-05 LAB — COMPREHENSIVE METABOLIC PANEL
ALT: 81 U/L — ABNORMAL HIGH (ref 0–44)
AST: 124 U/L — ABNORMAL HIGH (ref 15–41)
Albumin: 4.8 g/dL (ref 3.5–5.0)
Alkaline Phosphatase: 96 U/L (ref 38–126)
Anion gap: 16 — ABNORMAL HIGH (ref 5–15)
BUN: 8 mg/dL (ref 6–20)
CO2: 16 mmol/L — ABNORMAL LOW (ref 22–32)
Calcium: 10 mg/dL (ref 8.9–10.3)
Chloride: 105 mmol/L (ref 98–111)
Creatinine, Ser: 0.74 mg/dL (ref 0.44–1.00)
GFR, Estimated: >60 mL/min (ref >60)
Glucose, Bld: 151 mg/dL — ABNORMAL HIGH (ref 70–99)
Potassium: 3.6 mmol/L (ref 3.5–5.1)
Sodium: 137 mmol/L (ref 135–145)
Total Bilirubin: 1.7 mg/dL — ABNORMAL HIGH (ref 0.3–1.2)
Total Protein: 8.5 g/dL — ABNORMAL HIGH (ref 6.5–8.1)

## 2023-04-05 LAB — PROTIME-INR
INR: 1 (ref 0.8–1.2)
Prothrombin Time: 12.9 seconds (ref 11.4–15.2)

## 2023-04-05 LAB — CBC WITH DIFFERENTIAL/PLATELET
Abs Immature Granulocytes: 0.05 10*3/uL (ref 0.00–0.07)
Basophils Absolute: 0 10*3/uL (ref 0.0–0.1)
Basophils Relative: 0 %
Eosinophils Absolute: 0 10*3/uL (ref 0.0–0.5)
Eosinophils Relative: 0 %
HCT: 38.8 % (ref 36.0–46.0)
Hemoglobin: 13 g/dL (ref 12.0–15.0)
Immature Granulocytes: 1 %
Lymphocytes Relative: 5 %
Lymphs Abs: 0.5 10*3/uL — ABNORMAL LOW (ref 0.7–4.0)
MCH: 31 pg (ref 26.0–34.0)
MCHC: 33.5 g/dL (ref 30.0–36.0)
MCV: 92.6 fL (ref 80.0–100.0)
Monocytes Absolute: 0.4 10*3/uL (ref 0.1–1.0)
Monocytes Relative: 4 %
Neutro Abs: 9.4 10*3/uL — ABNORMAL HIGH (ref 1.7–7.7)
Neutrophils Relative %: 90 %
Platelets: 63 10*3/uL — ABNORMAL LOW (ref 150–400)
RBC: 4.19 MIL/uL (ref 3.87–5.11)
RDW: 13.7 % (ref 11.5–15.5)
WBC: 10.3 10*3/uL (ref 4.0–10.5)
nRBC: 0 % (ref 0.0–0.2)

## 2023-04-05 LAB — LIPASE, BLOOD: Lipase: 25 U/L (ref 11–51)

## 2023-04-05 LAB — ETHANOL: Alcohol, Ethyl (B): <10 mg/dL (ref <10)

## 2023-04-05 LAB — HCG, SERUM, QUALITATIVE: Preg, Serum: NEGATIVE

## 2023-04-05 LAB — MAGNESIUM: Magnesium: 1.8 mg/dL (ref 1.7–2.4)

## 2023-04-05 MED ORDER — OMEPRAZOLE MAGNESIUM 20 MG PO TBEC: 20.0000 mg | DELAYED_RELEASE_TABLET | Freq: Every day | ORAL | Status: DC

## 2023-04-05 MED ORDER — FOLIC ACID 1 MG PO TABS
1.0000 mg | ORAL_TABLET | Freq: Every day | ORAL | Status: DC
  Administered 2023-04-05 – 2023-04-15 (×11): 1 mg via ORAL
  Filled 2023-04-05 (×11): qty 1

## 2023-04-05 MED ORDER — ONDANSETRON HCL 4 MG/2ML IJ SOLN
4.0000 mg | Freq: Four times a day (QID) | INTRAMUSCULAR | Status: DC | PRN
  Administered 2023-04-05: 4 mg via INTRAVENOUS
  Filled 2023-04-05: qty 2

## 2023-04-05 MED ORDER — THIAMINE MONONITRATE 100 MG PO TABS: 100.0000 mg | ORAL_TABLET | Freq: Every day | ORAL | Status: DC

## 2023-04-05 MED ORDER — OXYCODONE HCL 5 MG PO TABS
5.0000 mg | ORAL_TABLET | ORAL | Status: DC | PRN
  Administered 2023-04-11 – 2023-04-13 (×2): 5 mg via ORAL
  Filled 2023-04-05 (×2): qty 1

## 2023-04-05 MED ORDER — LORAZEPAM 1 MG PO TABS: 1.0000 mg | ORAL_TABLET | ORAL | Status: AC | PRN

## 2023-04-05 MED ORDER — THIAMINE MONONITRATE 100 MG PO TABS
100.0000 mg | ORAL_TABLET | Freq: Every day | ORAL | Status: DC
  Administered 2023-04-06 – 2023-04-15 (×6): 100 mg via ORAL
  Filled 2023-04-05 (×7): qty 1

## 2023-04-05 MED ORDER — MORPHINE SULFATE (PF) 2 MG/ML IV SOLN
2.0000 mg | INTRAVENOUS | Status: DC | PRN
  Administered 2023-04-06: 2 mg via INTRAVENOUS
  Filled 2023-04-05: qty 1

## 2023-04-05 MED ORDER — CHLORHEXIDINE GLUCONATE CLOTH 2 % EX PADS
6.0000 | MEDICATED_PAD | Freq: Every day | CUTANEOUS | Status: DC
  Administered 2023-04-05 – 2023-04-14 (×9): 6 via TOPICAL

## 2023-04-05 MED ORDER — LORAZEPAM 2 MG/ML IJ SOLN
4.0000 mg | Freq: Once | INTRAMUSCULAR | Status: AC
  Administered 2023-04-05: 4 mg via INTRAVENOUS
  Filled 2023-04-05: qty 2

## 2023-04-05 MED ORDER — ONDANSETRON HCL 4 MG PO TABS: 4.0000 mg | ORAL_TABLET | Freq: Four times a day (QID) | ORAL | Status: DC | PRN

## 2023-04-05 MED ORDER — ACETAMINOPHEN 650 MG RE SUPP
650.0000 mg | Freq: Four times a day (QID) | RECTAL | Status: DC | PRN
  Filled 2023-04-05: qty 1

## 2023-04-05 MED ORDER — FOLIC ACID 1 MG PO TABS: 1.0000 mg | ORAL_TABLET | Freq: Every day | ORAL | Status: DC

## 2023-04-05 MED ORDER — PANTOPRAZOLE SODIUM 40 MG PO TBEC
40.0000 mg | DELAYED_RELEASE_TABLET | Freq: Every day | ORAL | Status: DC
  Administered 2023-04-06 – 2023-04-15 (×9): 40 mg via ORAL
  Filled 2023-04-05 (×10): qty 1

## 2023-04-05 MED ORDER — ACETAMINOPHEN 325 MG PO TABS
650.0000 mg | ORAL_TABLET | Freq: Four times a day (QID) | ORAL | Status: DC | PRN
  Administered 2023-04-11 – 2023-04-14 (×4): 650 mg via ORAL
  Filled 2023-04-05 (×4): qty 2

## 2023-04-05 MED ORDER — LORAZEPAM 2 MG/ML IJ SOLN
2.0000 mg | Freq: Once | INTRAMUSCULAR | Status: AC
  Administered 2023-04-05: 2 mg via INTRAVENOUS
  Filled 2023-04-05: qty 1

## 2023-04-05 MED ORDER — SODIUM CHLORIDE 0.9 % IV BOLUS
1000.0000 mL | Freq: Once | INTRAVENOUS | Status: AC
  Administered 2023-04-05: 1000 mL via INTRAVENOUS

## 2023-04-05 MED ORDER — HEPARIN SODIUM (PORCINE) 5000 UNIT/ML IJ SOLN
5000.0000 [IU] | Freq: Three times a day (TID) | INTRAMUSCULAR | Status: DC
  Administered 2023-04-06 – 2023-04-07 (×5): 5000 [IU] via SUBCUTANEOUS
  Filled 2023-04-05 (×5): qty 1

## 2023-04-05 MED ORDER — THIAMINE HCL 100 MG/ML IJ SOLN
100.0000 mg | Freq: Every day | INTRAMUSCULAR | Status: DC
  Administered 2023-04-05 – 2023-04-11 (×5): 100 mg via INTRAVENOUS
  Filled 2023-04-05 (×5): qty 2

## 2023-04-05 MED ORDER — LORAZEPAM 2 MG/ML IJ SOLN
1.0000 mg | INTRAMUSCULAR | Status: AC | PRN
  Administered 2023-04-05: 2 mg via INTRAVENOUS
  Administered 2023-04-06 (×2): 4 mg via INTRAVENOUS
  Administered 2023-04-06: 2 mg via INTRAVENOUS
  Administered 2023-04-06 (×2): 4 mg via INTRAVENOUS
  Administered 2023-04-06 – 2023-04-07 (×4): 2 mg via INTRAVENOUS
  Administered 2023-04-07: 4 mg via INTRAVENOUS
  Administered 2023-04-07: 2 mg via INTRAVENOUS
  Administered 2023-04-07: 4 mg via INTRAVENOUS
  Administered 2023-04-07: 2 mg via INTRAVENOUS
  Administered 2023-04-08: 3 mg via INTRAVENOUS
  Administered 2023-04-08 (×3): 2 mg via INTRAVENOUS
  Filled 2023-04-05: qty 1
  Filled 2023-04-05: qty 2
  Filled 2023-04-05 (×7): qty 1
  Filled 2023-04-05: qty 2
  Filled 2023-04-05: qty 1
  Filled 2023-04-05: qty 2
  Filled 2023-04-05 (×2): qty 1
  Filled 2023-04-05 (×3): qty 2
  Filled 2023-04-05 (×2): qty 1

## 2023-04-05 MED ORDER — ADULT MULTIVITAMIN W/MINERALS CH
1.0000 | ORAL_TABLET | Freq: Every day | ORAL | Status: DC
  Administered 2023-04-05 – 2023-04-15 (×10): 1 via ORAL
  Filled 2023-04-05 (×11): qty 1

## 2023-04-05 NOTE — ED Triage Notes (Signed)
Pt was at friends house had a ? Seizure. Pt has alcohol history but denies any use today. Pt was combative with ems. Per ems pt bit her tongue.

## 2023-04-05 NOTE — ED Provider Notes (Signed)
EMERGENCY DEPARTMENT AT [facility] Provider Note   CSN: [id number] Arrival date & time: 04/05/23  1650     History {Add pertinent medical, surgical, social history, OB history to HPI:1} Chief Complaint  Patient presents with   Seizures    Heidi [name] is a 52 y.o. female.  Brought in by ambulance after possibly had a seizure.  She said she had walked to her friend's house and was feeling very shaky.  Might of fallen.  Was able to get back on the porch and possibly had a seizure.  She is feeling very tremulous.  She thinks she might of had a seizure a few months ago when she had a similar problem.  She said she stopped drinking alcohol yesterday and was drinking pretty heavy before that.  She denies other injuries.  The history is provided by the patient and a friend.  Seizures Seizure activity on arrival: no   Seizure type:  Unable to specify Initial focality:  Unable to specify Episode characteristics: generalized shaking   Timing:  Once Progression:  Resolved Context: alcohol withdrawal   Recent head injury:  No recent head injuries PTA treatment:  None      Home Medications Prior to Admission medications   Medication Sig Start Date End Date Taking? Authorizing Provider  folic acid (FOLVITE) 1 MG tablet Take 1 tablet (1 mg total) by mouth daily. 10/21/22   Cleora Fleet, MD  Multiple Vitamin (MULTIVITAMIN WITH MINERALS) TABS tablet Take 1 tablet by mouth daily. 10/21/22   Johnson, Clanford L, MD  omeprazole (PRILOSEC OTC) 20 MG tablet Take 1 tablet (20 mg total) by mouth daily. 10/20/22 10/20/23  Johnson, Clanford L, MD  ondansetron (ZOFRAN) 4 MG tablet Take 1 tablet (4 mg total) by mouth every 8 (eight) hours as needed for nausea or vomiting. 10/20/22   Johnson, Clanford L, MD  thiamine (VITAMIN B-1) 100 MG tablet Take 1 tablet (100 mg total) by mouth daily. 10/21/22   Cleora Fleet, MD      Allergies    Patient has no known allergies.     Review of Systems   Review of Systems  Constitutional:  Negative for fever.  HENT:  Negative for sore throat.   Respiratory:  Negative for shortness of breath.   Cardiovascular:  Negative for chest pain.  Gastrointestinal:  Negative for abdominal pain.  Genitourinary:  Negative for dysuria.  Neurological:  Positive for tremors, seizures and light-headedness.    Physical Exam Updated Vital Signs Ht 5\' 3"  (1.6 m)   Wt 59 kg   BMI 23.03 kg/m  Physical Exam Vitals and nursing note reviewed.  Constitutional:      General: She is not in acute distress.    Appearance: Normal appearance. She is well-developed.  HENT:     Head: Normocephalic and atraumatic.     Mouth/Throat:     Comments: She has some possible bruising to her tongue.  She has tongue fasciculations Eyes:     Conjunctiva/sclera: Conjunctivae normal.  Cardiovascular:     Rate and Rhythm: Regular rhythm. Tachycardia present.     Heart sounds: No murmur heard. Pulmonary:     Effort: Pulmonary effort is normal. No respiratory distress.     Breath sounds: Normal breath sounds.  Abdominal:     Palpations: Abdomen is soft.     Tenderness: There is no abdominal tenderness. There is no guarding.  Musculoskeletal:        General: No  deformity. Normal range of motion.     Cervical back: Neck supple.  Skin:    General: Skin is warm and dry.     Capillary Refill: Capillary refill takes less than 2 seconds.  Neurological:     General: No focal deficit present.     Mental Status: She is alert.     Sensory: No sensory deficit.     Motor: No weakness.     Comments: Tremulous     ED Results / Procedures / Treatments   Labs (all labs ordered are listed, but only abnormal results are displayed) Labs Reviewed  COMPREHENSIVE METABOLIC PANEL  LIPASE, BLOOD  CBC WITH DIFFERENTIAL/PLATELET  ETHANOL  MAGNESIUM  PROTIME-INR  URINALYSIS, ROUTINE W REFLEX MICROSCOPIC  RAPID URINE DRUG SCREEN, HOSP PERFORMED  HCG, SERUM,  QUALITATIVE    EKG None  Radiology No results found.  Procedures Procedures  {Document cardiac monitor, telemetry assessment procedure when appropriate:1}  Medications Ordered in ED Medications  sodium chloride 0.9 % bolus 1,000 mL (has no administration in time range)  LORazepam (ATIVAN) injection 2 mg (has no administration in time range)  LORazepam (ATIVAN) tablet 1-4 mg (has no administration in time range)    Or  LORazepam (ATIVAN) injection 1-4 mg (has no administration in time range)  thiamine (VITAMIN B1) tablet 100 mg (has no administration in time range)    Or  thiamine (VITAMIN B1) injection 100 mg (has no administration in time range)  folic acid (FOLVITE) tablet 1 mg (has no administration in time range)  multivitamin with minerals tablet 1 tablet (has no administration in time range)    ED Course/ Medical Decision Making/ A&P   {   Click here for ABCD2, HEART and other calculatorsREFRESH Note before signing :1}                          Medical Decision Making Amount and/or Complexity of Data Reviewed Labs: ordered.  Risk OTC drugs. Prescription drug management.   This patient complains of ***; this involves an extensive number of treatment Options and is a complaint that carries with it a high risk of complications and morbidity. The differential includes ***  I ordered, reviewed and interpreted labs, which included *** I ordered medication *** and reviewed PMP when indicated. I ordered imaging studies which included *** and I independently    visualized and interpreted imaging which showed *** Additional history obtained from *** Previous records obtained and reviewed *** I consulted *** and discussed lab and imaging findings and discussed disposition.  Cardiac monitoring reviewed, *** Social determinants considered, *** Critical Interventions: ***  After the interventions stated above, I reevaluated the patient and found *** Admission and  further testing considered, ***   {Document critical care time when appropriate:1} {Document review of labs and clinical decision tools ie heart score, Chads2Vasc2 etc:1}  {Document your independent review of radiology images, and any outside records:1} {Document your discussion with family members, caretakers, and with consultants:1} {Document social determinants of health affecting pt's care:1} {Document your decision making why or why not admission, treatments were needed:1} Final Clinical Impression(s) / ED Diagnoses Final diagnoses:  None    Rx / DC Orders ED Discharge Orders     None

## 2023-04-05 NOTE — ED Notes (Signed)
Pt given ice chips per request

## 2023-04-05 NOTE — ED Notes (Signed)
Pt urinated on self while RN was at bedside This NT cleaned up pt . Sheets changed and blue paper scrubs placed on pt. Pt provided with warm blanket Pt states no additional needs at this time.

## 2023-04-05 NOTE — ED Notes (Signed)
ED Provider at bedside. 

## 2023-04-05 NOTE — ED Notes (Signed)
Pt's cousin contact info: Henrene Dodge [phone]

## 2023-04-05 NOTE — ED Notes (Signed)
Pt sleeping at this time. Even, unlabored resp noted.

## 2023-04-06 ENCOUNTER — Inpatient Hospital Stay (HOSPITAL_COMMUNITY): Payer: Medicaid Other

## 2023-04-06 ENCOUNTER — Encounter (HOSPITAL_COMMUNITY): Payer: Self-pay | Admitting: Family Medicine

## 2023-04-06 DIAGNOSIS — R7401 Elevation of levels of liver transaminase levels: Secondary | ICD-10-CM

## 2023-04-06 DIAGNOSIS — F10939 Alcohol use, unspecified with withdrawal, unspecified: Secondary | ICD-10-CM

## 2023-04-06 DIAGNOSIS — F17200 Nicotine dependence, unspecified, uncomplicated: Secondary | ICD-10-CM | POA: Diagnosis not present

## 2023-04-06 DIAGNOSIS — K219 Gastro-esophageal reflux disease without esophagitis: Secondary | ICD-10-CM

## 2023-04-06 LAB — CBC WITH DIFFERENTIAL/PLATELET
Abs Immature Granulocytes: 0.03 10*3/uL (ref 0.00–0.07)
Basophils Absolute: 0 10*3/uL (ref 0.0–0.1)
Basophils Relative: 0 %
Eosinophils Absolute: 0 10*3/uL (ref 0.0–0.5)
Eosinophils Relative: 0 %
HCT: 33 % — ABNORMAL LOW (ref 36.0–46.0)
Hemoglobin: 11.2 g/dL — ABNORMAL LOW (ref 12.0–15.0)
Immature Granulocytes: 1 %
Lymphocytes Relative: 12 %
Lymphs Abs: 0.7 10*3/uL (ref 0.7–4.0)
MCH: 31.4 pg (ref 26.0–34.0)
MCHC: 33.9 g/dL (ref 30.0–36.0)
MCV: 92.4 fL (ref 80.0–100.0)
Monocytes Absolute: 0.6 10*3/uL (ref 0.1–1.0)
Monocytes Relative: 11 %
Neutro Abs: 4.4 10*3/uL (ref 1.7–7.7)
Neutrophils Relative %: 76 %
Platelets: 53 10*3/uL — ABNORMAL LOW (ref 150–400)
RBC: 3.57 MIL/uL — ABNORMAL LOW (ref 3.87–5.11)
RDW: 13.6 % (ref 11.5–15.5)
WBC: 5.7 10*3/uL (ref 4.0–10.5)
nRBC: 0 % (ref 0.0–0.2)

## 2023-04-06 LAB — URINALYSIS, ROUTINE W REFLEX MICROSCOPIC
Bilirubin Urine: NEGATIVE
Glucose, UA: NEGATIVE mg/dL
Hgb urine dipstick: NEGATIVE
Ketones, ur: 80 mg/dL — AB
Leukocytes,Ua: NEGATIVE
Nitrite: NEGATIVE
Protein, ur: 30 mg/dL — AB
Specific Gravity, Urine: 1.017 (ref 1.005–1.030)
pH: 5 (ref 5.0–8.0)

## 2023-04-06 LAB — COMPREHENSIVE METABOLIC PANEL
ALT: 61 U/L — ABNORMAL HIGH (ref 0–44)
AST: 87 U/L — ABNORMAL HIGH (ref 15–41)
Albumin: 3.9 g/dL (ref 3.5–5.0)
Alkaline Phosphatase: 74 U/L (ref 38–126)
Anion gap: 10 (ref 5–15)
BUN: 7 mg/dL (ref 6–20)
CO2: 19 mmol/L — ABNORMAL LOW (ref 22–32)
Calcium: 8.8 mg/dL — ABNORMAL LOW (ref 8.9–10.3)
Chloride: 103 mmol/L (ref 98–111)
Creatinine, Ser: 0.55 mg/dL (ref 0.44–1.00)
GFR, Estimated: >60 mL/min (ref >60)
Glucose, Bld: 66 mg/dL — ABNORMAL LOW (ref 70–99)
Potassium: 3.1 mmol/L — ABNORMAL LOW (ref 3.5–5.1)
Sodium: 132 mmol/L — ABNORMAL LOW (ref 135–145)
Total Bilirubin: 1.4 mg/dL — ABNORMAL HIGH (ref 0.3–1.2)
Total Protein: 7.1 g/dL (ref 6.5–8.1)

## 2023-04-06 LAB — RAPID URINE DRUG SCREEN, HOSP PERFORMED
Amphetamines: NOT DETECTED
Barbiturates: NOT DETECTED
Benzodiazepines: POSITIVE — AB
Cocaine: NOT DETECTED
Opiates: NOT DETECTED
Tetrahydrocannabinol: NOT DETECTED

## 2023-04-06 LAB — MAGNESIUM: Magnesium: 1.9 mg/dL (ref 1.7–2.4)

## 2023-04-06 LAB — MRSA NEXT GEN BY PCR, NASAL: MRSA by PCR Next Gen: NOT DETECTED

## 2023-04-06 LAB — GLUCOSE, CAPILLARY: Glucose-Capillary: 79 mg/dL (ref 70–99)

## 2023-04-06 MED ORDER — MORPHINE SULFATE (PF) 2 MG/ML IV SOLN
0.5000 mg | INTRAVENOUS | Status: DC | PRN
  Administered 2023-04-06 – 2023-04-12 (×7): 0.5 mg via INTRAVENOUS
  Filled 2023-04-06 (×7): qty 1

## 2023-04-06 MED ORDER — POTASSIUM CHLORIDE IN NACL 20-0.9 MEQ/L-% IV SOLN: INTRAVENOUS | Status: DC

## 2023-04-06 MED ORDER — NICOTINE 14 MG/24HR TD PT24
14.0000 mg | MEDICATED_PATCH | Freq: Every day | TRANSDERMAL | Status: DC
  Administered 2023-04-06 – 2023-04-15 (×10): 14 mg via TRANSDERMAL
  Filled 2023-04-06 (×10): qty 1

## 2023-04-06 MED ORDER — MAGNESIUM SULFATE 4 GM/100ML IV SOLN
4.0000 g | Freq: Once | INTRAVENOUS | Status: AC
  Administered 2023-04-06: 4 g via INTRAVENOUS
  Filled 2023-04-06: qty 100

## 2023-04-06 MED ORDER — DEXMEDETOMIDINE HCL IN NACL 400 MCG/100ML IV SOLN
0.0000 ug/kg/h | INTRAVENOUS | Status: DC
  Administered 2023-04-07: 0.6 ug/kg/h via INTRAVENOUS
  Administered 2023-04-07: 1 ug/kg/h via INTRAVENOUS
  Administered 2023-04-07: 0.9 ug/kg/h via INTRAVENOUS
  Administered 2023-04-08 (×2): 1.3 ug/kg/h via INTRAVENOUS
  Administered 2023-04-08: 1 ug/kg/h via INTRAVENOUS
  Administered 2023-04-08: 1.3 ug/kg/h via INTRAVENOUS
  Administered 2023-04-09 (×2): 1.2 ug/kg/h via INTRAVENOUS
  Administered 2023-04-09 (×2): 1.3 ug/kg/h via INTRAVENOUS
  Administered 2023-04-10: 1.2 ug/kg/h via INTRAVENOUS
  Administered 2023-04-10: 1.1 ug/kg/h via INTRAVENOUS
  Administered 2023-04-10 (×2): 1 ug/kg/h via INTRAVENOUS
  Administered 2023-04-11: 0.6 ug/kg/h via INTRAVENOUS
  Administered 2023-04-11: 1 ug/kg/h via INTRAVENOUS
  Administered 2023-04-12: 0.8 ug/kg/h via INTRAVENOUS
  Administered 2023-04-12: 0.4 ug/kg/h via INTRAVENOUS
  Administered 2023-04-13: 0.9 ug/kg/h via INTRAVENOUS
  Administered 2023-04-13: 0.4 ug/kg/h via INTRAVENOUS
  Filled 2023-04-06 (×21): qty 100

## 2023-04-06 MED ORDER — DEXMEDETOMIDINE HCL IN NACL 400 MCG/100ML IV SOLN
INTRAVENOUS | Status: AC
  Administered 2023-04-06: 0.4 ug/kg/h via INTRAVENOUS
  Filled 2023-04-06: qty 100

## 2023-04-06 MED ORDER — CHLORDIAZEPOXIDE HCL 5 MG PO CAPS
10.0000 mg | ORAL_CAPSULE | Freq: Three times a day (TID) | ORAL | Status: DC
  Administered 2023-04-06 – 2023-04-15 (×23): 10 mg via ORAL
  Filled 2023-04-06 (×26): qty 2

## 2023-04-06 MED ORDER — POTASSIUM CHLORIDE CRYS ER 20 MEQ PO TBCR
40.0000 meq | EXTENDED_RELEASE_TABLET | Freq: Once | ORAL | Status: AC
  Administered 2023-04-06: 40 meq via ORAL
  Filled 2023-04-06: qty 2

## 2023-04-06 MED ORDER — LABETALOL HCL 5 MG/ML IV SOLN: 10.0000 mg | INTRAVENOUS | Status: DC | PRN

## 2023-04-06 NOTE — Assessment & Plan Note (Signed)
-   Patient reports that she normally drinks a sixpack per day - Last drink was approximately 36 hours prior to arrival - Patient reports seizure activity and loss of consciousness - Patient is currently tremulous and tachycardic but drowsy - Start CIWA protocol - Admitted to stepdown in case patient should need Precedex - Counseled on importance of drinking cessation - Continue to monitor

## 2023-04-06 NOTE — TOC Initial Note (Signed)
Transition of Care [facility]) - Initial/Assessment Note    Patient Details  Name: Heidi [name] MRN: [id number] Date of Birth: 08-17-1971  Transition of Care [facility]) CM/SW Contact:    Elliot Gault, LCSW Phone Number: 04/06/2023, 10:14 AM  Clinical Narrative:                  Pt admitted from home. Plan is for return to home at dc. TOC assessment completed for AODA and SDOH needs. Resource information provided for pt's reference.  Will follow and assist if further needs arise.  Expected Discharge Plan: Home/Self Care Barriers to Discharge: Continued Medical Work up   Patient Goals and CMS Choice Patient states their goals for this hospitalization and ongoing recovery are:: go home          Expected Discharge Plan and Services In-house Referral: Clinical Social Work     Living arrangements for the past 2 months: Single Family Home                                      Prior Living Arrangements/Services Living arrangements for the past 2 months: Single Family Home Lives with:: Self Patient language and need for interpreter reviewed:: Yes Do you feel safe going back to the place where you live?: Yes      Need for Family Participation in Patient Care: No (Comment)     Criminal Activity/Legal Involvement Pertinent to Current Situation/Hospitalization: No - Comment as needed  Activities of Daily Living Home Assistive Devices/Equipment: None ADL Screening (condition at time of admission) Patient's cognitive ability adequate to safely complete daily activities?: Yes Is the patient deaf or have difficulty hearing?: No Does the patient have difficulty seeing, even when wearing glasses/contacts?: No Does the patient have difficulty concentrating, remembering, or making decisions?: No Patient able to express need for assistance with ADLs?: Yes Does the patient have difficulty dressing or bathing?: No Independently performs ADLs?: Yes (appropriate for developmental  age) Does the patient have difficulty walking or climbing stairs?: No Weakness of Legs: None Weakness of Arms/Hands: None  Permission Sought/Granted                  Emotional Assessment       Orientation: : Oriented to Self, Oriented to Place, Oriented to  Time, Oriented to Situation Alcohol / Substance Use: Alcohol Use Psych Involvement: No (comment)  Admission diagnosis:  Alcohol withdrawal [F10.939] Patient Active Problem List   Diagnosis Date Noted   Transaminitis 04/06/2023   GERD (gastroesophageal reflux disease) 04/06/2023   Tobacco use disorder 04/06/2023   Alcohol withdrawal 10/19/2022   HTN (hypertension) 10/19/2022   Vomiting 10/19/2022   Hypomagnesemia 10/19/2022   CERVICALGIA 01/28/2011   SHOULDER PAIN 10/29/2010   CERVICAL RADICULOPATHY 10/29/2010   PCP:  Tylene Fantasia., PA-C Pharmacy:   [facility] [place], [place] - [address] [place] [address] Phone: [phone] Fax: [phone]     Social Determinants of Health (SDOH) Social History: SDOH Screenings   Food Insecurity: Food Insecurity Present (10/19/2022)  Housing: Medium Risk (10/19/2022)  Transportation Needs: No Transportation Needs (10/19/2022)  Utilities: At Risk (10/19/2022)  Tobacco Use: High Risk (04/06/2023)   SDOH Interventions: Food Insecurity Interventions: Inpatient TOC Utilities Interventions: Inpatient TOC   Readmission Risk Interventions     No data to display

## 2023-04-06 NOTE — Hospital Course (Signed)
52 year old female alcoholic apparently had a questionable seizure at a friend's house on 04/05/2023.  Patient was confused and combative with EMS and apparently had bit her tongue.  She had been out of alcohol for nonspecified time.  She apparently was feeling shaky when she walked over to the friend's house.  She was feeling very tremulous as well.  She has a hit heavy history of fall consumption.  She also has a history of alcohol withdrawal seizure in the past.  No other injuries associated with current presentation.  Trauma and metabolic workup was unremarkable and she was admitted to the hospital for complications of acute alcohol withdrawal.

## 2023-04-06 NOTE — Assessment & Plan Note (Signed)
-  Cessation counseling provided -Continue nicotine patch. 

## 2023-04-06 NOTE — Assessment & Plan Note (Signed)
Continue PPI ?

## 2023-04-06 NOTE — Assessment & Plan Note (Signed)
-   Transaminitis with an AST of 124 and ALT of 81 - Likely related to alcohol use - Right upper quadrant ultrasound in the a.m. - Trend CMP

## 2023-04-06 NOTE — Progress Notes (Addendum)
Patient started to become increasingly confused and agitated. Attempting to get out of bed and pulling at line and wires. HR was maintaining in the 150s. Patient given ativan per CIWA without any change. Nighttime Hospitalist contacted and soft bilateral wrist restraints were ordered. Patient continues to become more agitated. Patient began kicking, screaming, and managed to pull wrists out of restraints. Patient continued to scream and kick and attempted to bite staff. AC was contacted for assistance. Hospitalist was contacted again and updated on situation. Precedex was ordered. Multiple attempts were made to redirect and reorient patient but unfortunately they were unsuccessful. After initiation and titration of precedex patient was still agitated. However, after a short period of time patient began to relax and HR started to trend down. HR is now in the 80s. Patient is resting comfortably and remains in restraints. VSS.

## 2023-04-06 NOTE — H&P (Signed)
History and Physical    Patient: Heidi [name]:[id number] DOB: 1971/05/31 DOA: 04/05/2023 DOS: the patient was seen and examined on 04/06/2023 PCP: Tylene Fantasia., PA-C  Patient coming from: Home  Chief Complaint:  Chief Complaint  Patient presents with   Seizures   HPI: Heidi [name] is a 52 y.o. female with medical history significant of hypertension, alcohol use disorder, tobacco use disorder, and more presents the ED chief complaint of seizure.  Patient reports that she members getting up and walking around the house, then she fell.  She is very poor historian, constantly falling asleep outlined the history, mumbling, and quite honestly I do not think she remembers much of it.  She reports she did lose consciousness.  She is not sure if she hit her head.  She reports somebody told her she is having a seizure.  She reports she quit drinking in the past for 8 months and did not have seizures at that time.  She was not able to answer if she noticed herself being tremulous before that.  She has been nauseous, having palpitation, having chest pain etc.  Patient just kept falling asleep before answering his questions.  Eventually she started falling asleep before the question was fully repeated.  This is likely due to her Ativan, which is necessary for her withdrawal symptoms.  Unfortunately, it means I did not get much of a history.  Patient reports she does smoke half a pack per day.  She drinks a sixpack per day.  She does not use illicit drugs.  She is full code. Review of Systems: As mentioned in the history of present illness. All other systems reviewed and are negative. Past Medical History:  Diagnosis Date   Hypertension    History reviewed. No pertinent surgical history. Social History:  reports that she has been smoking cigarettes. She has never used smokeless tobacco. She reports current alcohol use of about 42.0 standard drinks of alcohol per week. She reports that she does  not use drugs.  No Known Allergies  History reviewed. No pertinent family history.  Prior to Admission medications   Medication Sig Start Date End Date Taking? Authorizing Provider  folic acid (FOLVITE) 1 MG tablet Take 1 tablet (1 mg total) by mouth daily. 10/21/22   Cleora Fleet, MD  Multiple Vitamin (MULTIVITAMIN WITH MINERALS) TABS tablet Take 1 tablet by mouth daily. 10/21/22   Johnson, Clanford L, MD  omeprazole (PRILOSEC OTC) 20 MG tablet Take 1 tablet (20 mg total) by mouth daily. 10/20/22 10/20/23  Johnson, Clanford L, MD  ondansetron (ZOFRAN) 4 MG tablet Take 1 tablet (4 mg total) by mouth every 8 (eight) hours as needed for nausea or vomiting. 10/20/22   Johnson, Clanford L, MD  thiamine (VITAMIN B-1) 100 MG tablet Take 1 tablet (100 mg total) by mouth daily. 10/21/22   Cleora Fleet, MD    Physical Exam: Vitals:   04/05/23 2100 04/05/23 2200 04/05/23 2300 04/06/23 0010  BP: (!) 143/95 (!) 137/93 (!) 156/118   Pulse: (!) 130 (!) 125 (!) 111   Resp: (!) 21 (!) 21 (!) 22   Temp:  98.2 F (36.8 C) 99.3 F (37.4 C) 99.3 F (37.4 C)  TempSrc:  Oral Oral Oral  SpO2: 100% 100% 98%   Weight:   60.9 kg   Height:    (1.6 m)    1.  General: Patient lying supine in bed,  no acute distress   2. Psychiatric:  Alert and oriented x 3, mood and behavior normal for situation, pleasant and cooperative with exam   3. Neurologic: Speech and language are normal, face is symmetric, moves all 4 extremities voluntarily, at baseline without acute deficits on limited exam   4. HEENMT:  Swollen left lower lip, normocephalic, pupils reactive to light, neck is supple, trachea is midline, mucous membranes are moist   5. Respiratory : Lungs are clear to auscultation bilaterally without wheezing, rhonchi, rales, no cyanosis, no increase in work of breathing or accessory muscle use   6. Cardiovascular : Heart rate tachycardic, rhythm is regular, no murmurs, rubs or gallops, no  peripheral edema, peripheral pulses palpated   7. Gastrointestinal:  Abdomen is soft, nondistended, nontender to palpation bowel sounds active, no masses or organomegaly palpated   8. Skin:  Skin is warm, dry and intact without rashes, acute lesions, or ulcers on limited exam   9.Musculoskeletal:  No acute deformities or trauma, no asymmetry in tone, no peripheral edema, peripheral pulses palpated, no tenderness to palpation in the extremities  Data Reviewed: In the ED Temp 98.8, heart rate 111-130, respiratory rate 21-29, blood pressure 143/95-158/105, satting 95-100% No leukocytosis with white blood cell count of 10.3, hemoglobin 13.0 Chemistries unremarkable Liver function shows an AST of 124, ALT 81, T. bili 1.7 Alcohol levels less than 10 CT head shows no acute changes, atrophy, chronic small vessel ischemic changes. EKG shows a heart rate of 115, sinus tach, QTc 442 Ativan 6 mg total given in the ED Patient was started on CIWA protocol Normal saline 1 L bolus was given Admission was requested for alcohol withdrawal Assessment and Plan: * Alcohol withdrawal - Patient reports that she normally drinks a sixpack per day - Last drink was approximately 36 hours prior to arrival - Patient reports seizure activity and loss of consciousness - Patient is currently tremulous and tachycardic but drowsy - Start CIWA protocol - Admitted to stepdown in case patient should need Precedex - Counseled on importance of drinking cessation - Continue to monitor  Tobacco use disorder - Counseled on importance of cessation - Nicotine patch ordered  GERD (gastroesophageal reflux disease) - Continue PPI  Transaminitis - Transaminitis with an AST of 124 and ALT of 81 - Likely related to alcohol use - Right upper quadrant ultrasound in the a.m. - Trend CMP      Advance Care Planning:   Code Status: Full Code  Consults: None at this time  Family Communication: No family at  bedside  Severity of Illness: The appropriate patient status for this patient is INPATIENT. Inpatient status is judged to be reasonable and necessary in order to provide the required intensity of service to ensure the patient's safety. The patient's presenting symptoms, physical exam findings, and initial radiographic and laboratory data in the context of their chronic comorbidities is felt to place them at high risk for further clinical deterioration. Furthermore, it is not anticipated that the patient will be medically stable for discharge from the hospital within 2 midnights of admission.   * I certify that at the point of admission it is my clinical judgment that the patient will require inpatient hospital care spanning beyond 2 midnights from the point of admission due to high intensity of service, high risk for further deterioration and high frequency of surveillance required.*  Author: Lilyan Gilford, DO 04/06/2023 1:39 AM  For on call review [email or web address].

## 2023-04-06 NOTE — Progress Notes (Signed)
PROGRESS NOTE   Heidi [name]  OZD:[id number] DOB: 07-21-1971 DOA: 04/05/2023 PCP: Tylene Fantasia., PA-C   Chief Complaint  Patient presents with   Seizures   Level of care: Stepdown  Brief Admission History:  52 year old female alcoholic apparently had a questionable seizure at a friend's house on 04/05/2023.  Patient was confused and combative with EMS and apparently had bit her tongue.  She had been out of alcohol for nonspecified time.  She apparently was feeling shaky when she walked over to the friend's house.  She was feeling very tremulous as well.  She has a hit heavy history of fall consumption.  She also has a history of alcohol withdrawal seizure in the past.  No other injuries associated with current presentation.  Trauma and metabolic workup was unremarkable and she was admitted to the hospital for complications of acute alcohol withdrawal.   Assessment and Plan:  Acute Alcohol withdrawal Alcohol withdrawal seizure - Patient reports that she normally drinks a sixpack beer per day - Last drink was approximately 36 hours prior to arrival - Witnessed seizure activity and loss of consciousness reported by friend and EMS reports -Patient reports prior history of alcohol withdrawal seizure - Patient remains tremulous and tachycardic and somnolent with HIGH RISK of severe alcohol withdrawal - CIWA protocol in place with current high CIWA score - Added Librium three times daily - Admitted to stepdown in case patient needs a higher level of sedation - Continue to monitor closely    Tobacco use disorder - Nicotine patch ordered   GERD (gastroesophageal reflux disease) - Continue PPI   Transaminitis - Transaminitis with an AST of 124 and ALT of 81 - Likely related to chronic alcohol use - Right upper quadrant ultrasound pending - Trend CMP   DVT prophylaxis: [place] heparin Code Status: Full  Family Communication:  Disposition: Status is: Inpatient Remains inpatient  appropriate because: intensity    Consultants:   Procedures:   Antimicrobials:    Subjective: Somnolent   Objective: Vitals:   04/06/23 0712 04/06/23 0800 04/06/23 1100 04/06/23 1137  BP:  130/82 (!) 142/89   Pulse: 100 (!) 104 (!) 105 (!) 113  Resp: (!) 23 (!) 22 18 (!) 24  Temp: 99.1 F (37.3 C)   98.3 F (36.8 C)  TempSrc: Axillary   Oral  SpO2: 99% 98% 97% 97%  Weight:      Height:        Intake/Output Summary (Last 24 hours) at 04/06/2023 1157 Last data filed at 04/06/2023 0159 Gross per 24 hour  Intake 1200 ml  Output 500 ml  Net 700 ml   Filed Weights   04/05/23 1657 04/05/23 1659 04/05/23 2300  Weight: 62.2 kg 59 kg 60.9 kg   Examination:  General exam: Appears ill; somnolent but arousable;   Respiratory system: Clear to auscultation. Respiratory effort normal. Cardiovascular system: tachycardic rate, normal S1 & S2 heard. No JVD, murmurs, rubs, gallops or clicks. No pedal edema. Gastrointestinal system: Abdomen is nondistended, soft and nontender. No organomegaly or masses felt. Normal bowel sounds heard. Central nervous system: somnolent but very confused. No focal neurological deficits. Extremities: Symmetric 5 x 5 power. Skin: No rashes, lesions or ulcers. Psychiatry: Judgement and insight appear poor. Mood & affect unable to assess.   Data Reviewed: I have personally reviewed following labs and imaging studies  CBC: Recent Labs  Lab 04/05/23 1833 04/06/23 0408  WBC 10.3 5.7  NEUTROABS 9.4* 4.4  HGB 13.0 11.2*  HCT  38.8 33.0*  MCV 92.6 92.4  PLT 63* 53*    Basic Metabolic Panel: Recent Labs  Lab 04/05/23 1833 04/06/23 0408  NA 137 132*  K 3.6 3.1*  CL 105 103  CO2 16* 19*  GLUCOSE 151* 66*  BUN 8 7  CREATININE 0.74 0.55  CALCIUM 10.0 8.8*  MG 1.8 1.9    CBG: Recent Labs  Lab 04/06/23 0646  GLUCAP 79    Recent Results (from the past 240 hour(s))  MRSA Next Gen by PCR, Nasal     Status: None   Collection Time: 04/05/23  10:46 PM   Specimen: Nasal Mucosa; Nasal Swab  Result Value Ref Range Status   MRSA by PCR Next Gen NOT DETECTED NOT DETECTED Final    Comment: (NOTE) The GeneXpert MRSA Assay (FDA approved for NASAL specimens only), is one component of a comprehensive MRSA colonization surveillance program. It is not intended to diagnose MRSA infection nor to guide or monitor treatment for MRSA infections. Test performance is not FDA approved in patients less than 59 years old. Performed at [facility], [address], [place], [place] [address]      Radiology Studies: US Abdomen Limited RUQ (LIVER/GB)  Result Date: 04/06/2023 CLINICAL DATA:  Elevated LFTs EXAM: ULTRASOUND ABDOMEN LIMITED RIGHT UPPER QUADRANT COMPARISON:  None Available. FINDINGS: Gallbladder: No gallstones or wall thickening visualized. No sonographic Murphy sign noted by sonographer. Common bile duct: Diameter: 4 mm Liver: Loosely increased in echogenicity without focal mass. Portal vein is patent on color Doppler imaging with normal direction of blood flow towards the liver. Other: None. IMPRESSION: Fatty liver. No acute abnormality noted. Electronically Signed   By: Alcide Clever M.D.   On: 04/06/2023 09:19   CT Head Wo Contrast  Result Date: 04/05/2023 CLINICAL DATA:  Seizure EXAM: CT HEAD WITHOUT CONTRAST TECHNIQUE: Contiguous axial images were obtained from the base of the skull through the vertex without intravenous contrast. RADIATION DOSE REDUCTION: This exam was performed according to the departmental dose-optimization program which includes automated exposure control, adjustment of the mA and/or kV according to patient size and/or use of iterative reconstruction technique. COMPARISON:  07/03/2018 FINDINGS: Motion degraded images. Brain: No evidence of acute infarction, hemorrhage, hydrocephalus, extra-axial collection or mass lesion/mass effect. Global cortical atrophy, out of proportion for age. Mild subcortical white matter and  periventricular small vessel ischemic changes. Vascular: Intracranial atherosclerosis. Skull: Normal. Negative for fracture or focal lesion. Sinuses/Orbits: The visualized paranasal sinuses are essentially clear. The mastoid air cells are unopacified. Other: None. IMPRESSION: Motion degraded images. No acute intracranial abnormality. Atrophy with small vessel ischemic changes. Electronically Signed   By: Charline Bills M.D.   On: 04/05/2023 20:32    Scheduled Meds:  chlordiazePOXIDE  10 mg Oral TID   Chlorhexidine Gluconate Cloth  6 each Topical Q0600   folic acid  1 mg Oral Daily   heparin  5,000 Units Subcutaneous Q8H   multivitamin with minerals  1 tablet Oral Daily   nicotine  14 mg Transdermal Daily   pantoprazole  40 mg Oral Daily   potassium chloride  40 mEq Oral Once   thiamine  100 mg Oral Daily   Or   thiamine  100 mg Intravenous Daily   Continuous Infusions:  0.9 % NaCl with KCl 20 mEq / L 100 mL/hr at 04/06/23 0856    LOS: 1 day   Time spent: 36 mins  Idalia Allbritton Laural Benes, MD How to contact the [facility] Attending or Consulting provider 7A -  7P or covering provider during after hours 7P -7A, for this patient?  Check the care team in [facility] and look for a) attending/consulting [facility] provider listed and b) the [facility] team listed Log into [email or web address] and use [facility] universal password to access. If you do not have the password, please contact the hospital operator. Locate the [facility] provider you are looking for under [facility] and page to a number that you can be directly reached. If you still have difficulty reaching the provider, please page the [facility] (Director on Call) for the Hospitalists listed on amion for assistance.  04/06/2023, 11:57 AM

## 2023-04-07 DIAGNOSIS — F10931 Alcohol use, unspecified with withdrawal delirium: Secondary | ICD-10-CM | POA: Diagnosis not present

## 2023-04-07 DIAGNOSIS — F17200 Nicotine dependence, unspecified, uncomplicated: Secondary | ICD-10-CM | POA: Diagnosis not present

## 2023-04-07 DIAGNOSIS — R7401 Elevation of levels of liver transaminase levels: Secondary | ICD-10-CM | POA: Diagnosis not present

## 2023-04-07 LAB — COMPREHENSIVE METABOLIC PANEL
ALT: 46 U/L — ABNORMAL HIGH (ref 0–44)
AST: 61 U/L — ABNORMAL HIGH (ref 15–41)
Albumin: 3.6 g/dL (ref 3.5–5.0)
Alkaline Phosphatase: 67 U/L (ref 38–126)
Anion gap: 9 (ref 5–15)
BUN: 5 mg/dL — ABNORMAL LOW (ref 6–20)
CO2: 20 mmol/L — ABNORMAL LOW (ref 22–32)
Calcium: 9.2 mg/dL (ref 8.9–10.3)
Chloride: 112 mmol/L — ABNORMAL HIGH (ref 98–111)
Creatinine, Ser: 0.5 mg/dL (ref 0.44–1.00)
GFR, Estimated: >60 mL/min (ref >60)
Glucose, Bld: 118 mg/dL — ABNORMAL HIGH (ref 70–99)
Potassium: 3.8 mmol/L (ref 3.5–5.1)
Sodium: 141 mmol/L (ref 135–145)
Total Bilirubin: 1.6 mg/dL — ABNORMAL HIGH (ref 0.3–1.2)
Total Protein: 6.7 g/dL (ref 6.5–8.1)

## 2023-04-07 LAB — CBC
HCT: 37.8 % (ref 36.0–46.0)
Hemoglobin: 12.5 g/dL (ref 12.0–15.0)
MCH: 31.2 pg (ref 26.0–34.0)
MCHC: 33.1 g/dL (ref 30.0–36.0)
MCV: 94.3 fL (ref 80.0–100.0)
Platelets: 58 10*3/uL — ABNORMAL LOW (ref 150–400)
RBC: 4.01 MIL/uL (ref 3.87–5.11)
RDW: 13.7 % (ref 11.5–15.5)
WBC: 3.7 10*3/uL — ABNORMAL LOW (ref 4.0–10.5)
nRBC: 0 % (ref 0.0–0.2)

## 2023-04-07 LAB — CULTURE, BLOOD (ROUTINE X 2)

## 2023-04-07 LAB — MAGNESIUM: Magnesium: 2.3 mg/dL (ref 1.7–2.4)

## 2023-04-07 LAB — GLUCOSE, CAPILLARY: Glucose-Capillary: 126 mg/dL — ABNORMAL HIGH (ref 70–99)

## 2023-04-07 MED ORDER — HYDRALAZINE HCL 20 MG/ML IJ SOLN
10.0000 mg | Freq: Once | INTRAMUSCULAR | Status: AC
  Administered 2023-04-07: 10 mg via INTRAVENOUS
  Filled 2023-04-07: qty 1

## 2023-04-07 MED ORDER — HYDRALAZINE HCL 20 MG/ML IJ SOLN
10.0000 mg | Freq: Four times a day (QID) | INTRAMUSCULAR | Status: DC | PRN
  Administered 2023-04-07 – 2023-04-09 (×4): 10 mg via INTRAVENOUS
  Filled 2023-04-07 (×4): qty 1

## 2023-04-07 MED ORDER — AMLODIPINE BESYLATE 5 MG PO TABS
10.0000 mg | ORAL_TABLET | Freq: Every day | ORAL | Status: DC
  Administered 2023-04-07 – 2023-04-14 (×8): 10 mg via ORAL
  Filled 2023-04-07 (×8): qty 2

## 2023-04-07 NOTE — Progress Notes (Addendum)
PROGRESS NOTE   Heidi [name]  WUJ:[id number] DOB: 1971-05-12 DOA: 04/05/2023 PCP: Tylene Fantasia., PA-C   Chief Complaint  Patient presents with   Seizures   Level of care: ICU  Brief Admission History:  52 year old female alcoholic apparently had a questionable seizure at a friend's house on 04/05/2023.  Patient was confused and combative with EMS and apparently had bit her tongue.  She had been out of alcohol for nonspecified time.  She apparently was feeling shaky when she walked over to the friend's house.  She was feeling very tremulous as well.  She has a hit heavy history of fall consumption.  She also has a history of alcohol withdrawal seizure in the past.  No other injuries associated with current presentation.  Trauma and metabolic workup was unremarkable and she was admitted to the hospital for complications of acute alcohol withdrawal.   Assessment and Plan:  Acute Alcohol withdrawal Alcohol withdrawal seizure - Patient reports that she normally drinks a sixpack beer per day - Last drink was approximately 36 hours prior to arrival - Witnessed seizure activity and loss of consciousness reported by friend and EMS reports -Patient reports prior history of alcohol withdrawal seizure -Patient is in full-blown DTs -episodes  of agitation alternating with episodes of lethargy depending of level of sedation with medications  -Did not do well with attempt to wean down Precedex drip -Continue IV Precedex and benzos per CIWA protocol in place with current high CIWA score - Tobacco use disorder - Nicotine patch ordered   GERD (gastroesophageal reflux disease) - Continue PPI   Transaminitis -More consistent with alcoholic hepatitis - Right upper quadrant ultrasound with fatty liver otherwise no acute findings - Trend CMP  Acute urinary retention--required in and out catheterization   DVT prophylaxis: [place] heparin Code Status: Full  Family Communication:  Disposition:  Status is: Inpatient Remains inpatient appropriate because: intensity      Subjective: - episodes  of agitation alternating with episodes of lethargy depending of level of sedation with medications  -Did not do well with attempt to wean down Precedex drip  Objective: Vitals:   04/07/23 1407 04/07/23 1437 04/07/23 1502 04/07/23 1530  BP: (!) 174/94  111/62   Pulse: (!) 52 66 66 87  Resp: 20  19   Temp:      TempSrc:      SpO2: 100%  100%   Weight:      Height:        Intake/Output Summary (Last 24 hours) at 04/07/2023 1552 Last data filed at 04/07/2023 1526 Gross per 24 hour  Intake 3018.92 ml  Output 2750 ml  Net 268.92 ml   Filed Weights   04/05/23 1657 04/05/23 1659 04/05/23 2300  Weight: 62.2 kg 59 kg 60.9 kg   Physical Exam  Gen:- episodes  of agitation alternating with episodes of lethargy depending of level of sedation with medications  HEENT:- [place].AT, No sclera icterus Neck-Supple Neck,No JVD,.  Lungs-  CTAB , fair air movement bilaterally  CV- S1, S2 normal, RRR Abd-  +ve B.Sounds, Abd Soft, No tenderness,    Extremity/Skin:- No  edema,   good pedal pulses  Psych-confused, disoriented, agitated from time to time  neuro-thrashing around at times, no new focal deficits, +ve tremors   Data Reviewed: I have personally reviewed following labs and imaging studies  CBC: Recent Labs  Lab 04/05/23 1833 04/06/23 0408 04/07/23 0509  WBC 10.3 5.7 3.7*  NEUTROABS 9.4* 4.4  --  HGB 13.0 11.2* 12.5  HCT 38.8 33.0* 37.8  MCV 92.6 92.4 94.3  PLT 63* 53* 58*    Basic Metabolic Panel: Recent Labs  Lab 04/05/23 1833 04/06/23 0408 04/07/23 0509  NA 137 132* 141  K 3.6 3.1* 3.8  CL 105 103 112*  CO2 16* 19* 20*  GLUCOSE 151* 66* 118*  BUN 8 7 5*  CREATININE 0.74 0.55 0.50  CALCIUM 10.0 8.8* 9.2  MG 1.8 1.9 2.3    CBG: Recent Labs  Lab 04/06/23 0646  GLUCAP 79    Recent Results (from the past 240 hour(s))  MRSA Next Gen by PCR, Nasal     Status:  None   Collection Time: 04/05/23 10:46 PM   Specimen: Nasal Mucosa; Nasal Swab  Result Value Ref Range Status   MRSA by PCR Next Gen NOT DETECTED NOT DETECTED Final    Comment: (NOTE) The GeneXpert MRSA Assay (FDA approved for NASAL specimens only), is one component of a comprehensive MRSA colonization surveillance program. It is not intended to diagnose MRSA infection nor to guide or monitor treatment for MRSA infections. Test performance is not FDA approved in patients less than 26 years old. Performed at [facility], [address] [place], [place] [address]      Radiology Studies: US Abdomen Limited RUQ (LIVER/GB)  Result Date: 04/06/2023 CLINICAL DATA:  Elevated LFTs EXAM: ULTRASOUND ABDOMEN LIMITED RIGHT UPPER QUADRANT COMPARISON:  None Available. FINDINGS: Gallbladder: No gallstones or wall thickening visualized. No sonographic Murphy sign noted by sonographer. Common bile duct: Diameter: 4 mm Liver: Loosely increased in echogenicity without focal mass. Portal vein is patent on color Doppler imaging with normal direction of blood flow towards the liver. Other: None. IMPRESSION: Fatty liver. No acute abnormality noted. Electronically Signed   By: Alcide Clever M.D.   On: 04/06/2023 09:19   CT Head Wo Contrast  Result Date: 04/05/2023 CLINICAL DATA:  Seizure EXAM: CT HEAD WITHOUT CONTRAST TECHNIQUE: Contiguous axial images were obtained from the base of the skull through the vertex without intravenous contrast. RADIATION DOSE REDUCTION: This exam was performed according to the departmental dose-optimization program which includes automated exposure control, adjustment of the mA and/or kV according to patient size and/or use of iterative reconstruction technique. COMPARISON:  07/03/2018 FINDINGS: Motion degraded images. Brain: No evidence of acute infarction, hemorrhage, hydrocephalus, extra-axial collection or mass lesion/mass effect. Global cortical atrophy, out of proportion for age.  Mild subcortical white matter and periventricular small vessel ischemic changes. Vascular: Intracranial atherosclerosis. Skull: Normal. Negative for fracture or focal lesion. Sinuses/Orbits: The visualized paranasal sinuses are essentially clear. The mastoid air cells are unopacified. Other: None. IMPRESSION: Motion degraded images. No acute intracranial abnormality. Atrophy with small vessel ischemic changes. Electronically Signed   By: Charline Bills M.D.   On: 04/05/2023 20:32    Scheduled Meds:  amLODipine  10 mg Oral Daily   chlordiazePOXIDE  10 mg Oral TID   Chlorhexidine Gluconate Cloth  6 each Topical Q0600   folic acid  1 mg Oral Daily   heparin  5,000 Units Subcutaneous Q8H   multivitamin with minerals  1 tablet Oral Daily   nicotine  14 mg Transdermal Daily   pantoprazole  40 mg Oral Daily   thiamine  100 mg Oral Daily   Or   thiamine  100 mg Intravenous Daily   Continuous Infusions:  0.9 % NaCl with KCl 20 mEq / L 100 mL/hr at 04/07/23 1342   dexmedetomidine (PRECEDEX) IV infusion 0.9  mcg/kg/hr (04/07/23 1354)    LOS: 2 days   Shon Hale, MD How to contact the [facility] Attending or Consulting provider 7A - 7P or covering provider during after hours 7P -7A, for this patient?  Check the care team in [facility] and look for a) attending/consulting TRH provider listed and b) the [facility] team listed Log into [email or web address] and use [place]'s universal password to access. If you do not have the password, please contact the hospital operator. Locate the [facility] provider you are looking for under [facility] and page to a number that you can be directly reached. If you still have difficulty reaching the provider, please page the [facility] (Director on Call) for the Hospitalists listed on amion for assistance.  04/07/2023, 3:52 PM

## 2023-04-07 NOTE — Progress Notes (Signed)
1920 Pt combative disorientated, uncooperative, CIWAA 23 protocol followed and notified Dr. Thomes Dinning, order to continue to monitor. 1610 Pt resting comfortably, VSS, Precedex gtt @ 1.66mcg/hr.

## 2023-04-07 NOTE — Progress Notes (Signed)
Patient rectal temp noted to be 92.8. Patient agitated, fighting staff and alert at this time. Dr Marisa Severin made aware of temp. Warming blanket ordered and now applied.

## 2023-04-07 NOTE — Plan of Care (Signed)

## 2023-04-07 NOTE — Progress Notes (Signed)
Patient more appropriate at this time. Still only oriented to person but no longer aggressive or pulling at lines. Patient following commands. Mouth care given, water given, patient repositioned. Restraints removed.

## 2023-04-07 NOTE — Progress Notes (Signed)
Patient out of soft wrist restraints this morning and precedex titrated down. Patient became more alert and able to state who she was and where she was at. She was able to communicate the year but unsure of month. Patient tolerated PO meds and diet well. Appetite fair. Patient attempted to feed self but was having a little difficulty so writer helped feed patient breakfast, lunch and dinner. Writer reassured patient she was in safe place and showed her her two cell phones and purse per patient request. Patient was cooperative with care this morning but became more combative and resistant to care as the afternoon progressed. After lunch patient was repeatedly attempting to climb out the bed to go find her mother and/or go to the store. Writer was able to verbally calm and redirect at first but then had to give PRN Ativan and increase Precedex as patient progressed to getting more confused and agitated and more difficult to reorient and verbally calm. IV ativan given at 1216 and 1339 per CIWA scoring. Dr Marisa Severin made aware. Patient diastolic blood pressures were above 100. Dr Marisa Severin made aware. Patient denied any chest pain and or discomfort. PRN Hydralazine given with good results. Bladder scan at 1506 due to patient has not voided since 7am with result of . Patient encouraged to try to urinate but unsuccessful. Dr Marisa Severin made aware. In and out cath done per Dr Marisa Severin with output noted. Dr Marisa Severin made aware.  Afterwards patient became increasingly agitated stating we stole her watch, writer showed patient her watch that was in her purse and then patient would yell out we stole something else and she needed to get up, patient started swinging and kicking at staff and swearing. Unable to verbally calm patient and  IV Ativan given per CIWA score. Dr Marisa Severin made aware and soft wrist restraints applied due to unable to currently calm patient and patient also trying to pull lines and IV's out. Explained  discontinue of restraint criteria to patient. Will continue to monitor.

## 2023-04-08 ENCOUNTER — Inpatient Hospital Stay (HOSPITAL_COMMUNITY): Payer: Medicaid Other

## 2023-04-08 DIAGNOSIS — K219 Gastro-esophageal reflux disease without esophagitis: Secondary | ICD-10-CM | POA: Diagnosis not present

## 2023-04-08 DIAGNOSIS — F10931 Alcohol use, unspecified with withdrawal delirium: Secondary | ICD-10-CM | POA: Diagnosis not present

## 2023-04-08 DIAGNOSIS — R7401 Elevation of levels of liver transaminase levels: Secondary | ICD-10-CM | POA: Diagnosis not present

## 2023-04-08 LAB — COMPREHENSIVE METABOLIC PANEL
ALT: 45 U/L — ABNORMAL HIGH (ref 0–44)
AST: 63 U/L — ABNORMAL HIGH (ref 15–41)
Albumin: 3.4 g/dL — ABNORMAL LOW (ref 3.5–5.0)
Alkaline Phosphatase: 66 U/L (ref 38–126)
Anion gap: 8 (ref 5–15)
BUN: 5 mg/dL — ABNORMAL LOW (ref 6–20)
CO2: 19 mmol/L — ABNORMAL LOW (ref 22–32)
Calcium: 9.7 mg/dL (ref 8.9–10.3)
Chloride: 115 mmol/L — ABNORMAL HIGH (ref 98–111)
Creatinine, Ser: 0.48 mg/dL (ref 0.44–1.00)
GFR, Estimated: >60 mL/min (ref >60)
Glucose, Bld: 107 mg/dL — ABNORMAL HIGH (ref 70–99)
Potassium: 3.6 mmol/L (ref 3.5–5.1)
Sodium: 142 mmol/L (ref 135–145)
Total Bilirubin: 0.9 mg/dL (ref 0.3–1.2)
Total Protein: 6.4 g/dL — ABNORMAL LOW (ref 6.5–8.1)

## 2023-04-08 LAB — URINALYSIS, W/ REFLEX TO CULTURE (INFECTION SUSPECTED)
Bilirubin Urine: NEGATIVE
Glucose, UA: NEGATIVE mg/dL
Ketones, ur: 5 mg/dL — AB
Leukocytes,Ua: NEGATIVE
Nitrite: NEGATIVE
Protein, ur: NEGATIVE mg/dL
Specific Gravity, Urine: 1.012 (ref 1.005–1.030)
pH: 6 (ref 5.0–8.0)

## 2023-04-08 LAB — CULTURE, BLOOD (ROUTINE X 2)

## 2023-04-08 MED ORDER — TAMSULOSIN HCL 0.4 MG PO CAPS: 0.4000 mg | ORAL_CAPSULE | Freq: Every day | ORAL | Status: DC

## 2023-04-08 MED ORDER — TAMSULOSIN HCL 0.4 MG PO CAPS
0.4000 mg | ORAL_CAPSULE | Freq: Every day | ORAL | Status: DC
  Administered 2023-04-09 – 2023-04-15 (×7): 0.4 mg via ORAL
  Filled 2023-04-08 (×8): qty 1

## 2023-04-08 MED ORDER — LORAZEPAM 2 MG/ML IJ SOLN
1.0000 mg | INTRAMUSCULAR | Status: AC | PRN
  Administered 2023-04-08: 2 mg via INTRAVENOUS
  Administered 2023-04-09: 1 mg via INTRAVENOUS
  Administered 2023-04-09 (×2): 2 mg via INTRAVENOUS
  Administered 2023-04-09: 4 mg via INTRAVENOUS
  Administered 2023-04-10 – 2023-04-11 (×3): 2 mg via INTRAVENOUS
  Filled 2023-04-08: qty 1
  Filled 2023-04-08: qty 2
  Filled 2023-04-08 (×6): qty 1

## 2023-04-08 MED ORDER — LORAZEPAM 1 MG PO TABS: 1.0000 mg | ORAL_TABLET | ORAL | Status: AC | PRN

## 2023-04-08 NOTE — Progress Notes (Addendum)
Patient continues on CIWA protocol and was cooperative this morning enough to take morning pills and was fed breakfast, lunch and dinner. Appetite not great and patient only ate about 15% of meal but did drink fluids well. Soft wrist restraints continue due to patient agitation and attempting to pulling lines and trying to hit staff when being redirected while she is trying to climb out the bed stating she is going to the store and to get her kids. Verbal calming ineffective. Dr Marisa Severin made aware and ativan given per ciwa and precedex adjusted as needed. Patient family (mother and friend) in to visit late this morning/early afternoon and patient became increasingly agitated wanting to leave and yelling at her mom while she was leaving. Difficult to verbally calm patient. Ativan given with good effect. Range of motion and drinks (water, soda, tea) offered and provided to patient every 1-2 hours. Patient tolerated well with supervision but was not able to steadily hold drinks so Clinical research associate assisted. No difficulty swallowing noted. Patient incontinent of urine this morning but did not void all afternoon, bladder scan done at 1815 and in and out cath done with output noted. Dr Marisa Severin made aware. Patient would awaken when name called all day but remains confused as to where she is and situation. Patient reoriented by Clinical research associate each time.

## 2023-04-08 NOTE — Progress Notes (Addendum)
PROGRESS NOTE   Heidi [name]  ZOX:[id number] DOB: 04/04/1971 DOA: 04/05/2023 PCP: Tylene Fantasia., PA-C   Chief Complaint  Patient presents with   Seizures   Level of care: ICU  Brief Admission History:  52 year old female alcoholic apparently had a questionable seizure at a friend's house on 04/05/2023.  Patient was confused and combative with EMS and apparently had bit her tongue.  She had been out of alcohol for nonspecified time.  She apparently was feeling shaky when she walked over to the friend's house.  She was feeling very tremulous as well.  She has a hit heavy history of fall consumption.  She also has a history of alcohol withdrawal seizure in the past.  No other injuries associated with current presentation.  Trauma and metabolic workup was unremarkable and she was admitted to the hospital for complications of acute alcohol withdrawal.   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 -episodes  of agitation alternating with episodes of lethargy depending of level of sedation with medications  --Continues to require high doses of continuous IV Precedex drip along with as needed benzos per CIWA protocol in place with current high CIWA score - Tobacco use disorder - Nicotine patch ordered   GERD (gastroesophageal reflux disease) - Continue PPI  Thrombocytopenia- due to a combination of 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 and etoh hepatitis- No Bleeding concerns  Monitor closely   Transaminitis -More consistent with alcoholic hepatitis - Right upper quadrant ultrasound with fatty liver otherwise no acute findings     Latest Ref Rng & Units 04/08/2023    4:31 AM 04/07/2023    5:09 AM 04/06/2023    4:08 AM  Hepatic Function  Total Protein 6.5 - 8.1 g/dL 6.4  6.7  7.1   Albumin 3.5 - 5.0 g/dL 3.4  3.6  3.9   AST 15 - 41 U/L 63  61  87   ALT 0 - 44 U/L 45  46  61   Alk Phosphatase 38 - 126 U/L 66  67  74   Total Bilirubin 0.3 - 1.2 mg/dL 0.9  1.6  1.4    Acute urinary retention--required in and out catheterization   DVT prophylaxis: [place] heparin Code Status: Full  Family Communication:  Disposition: Status is: Inpatient Remains inpatient appropriate because: intensity      Subjective: - 04/08/23 episodes  of agitation alternating with episodes of lethargy depending of level of sedation with medications  Called her mother [phone] for update----left voicemail  Objective: Vitals:   04/08/23 1145 04/08/23 1200 04/08/23 1215 04/08/23 1400  BP: 119/87 (!) 139/97 (!) 163/98 (!) 165/95  Pulse: (!) 106 91 74 66  Resp: 20 (!) 22 (!) 22 15  Temp: 97.8 F (36.6 C)     TempSrc: Rectal     SpO2: 100% 100% 98% 98%  Weight:      Height:        Intake/Output Summary (Last 24 hours) at 04/08/2023 1459 Last data filed at 04/08/2023 1230 Gross per 24 hour  Intake 2570.14 ml  Output 2475 ml  Net 95.14 ml   Filed Weights   04/05/23 1657 04/05/23 1659 04/05/23 2300  Weight: 62.2 kg 59 kg 60.9 kg   Physical Exam  Gen:- episodes  of agitation alternating with episodes of lethargy depending of level of sedation with medications  HEENT:- [place].AT, No sclera icterus Neck-Supple Neck,No JVD,.  Lungs-  CTAB , fair air movement bilaterally  CV- S1, S2 normal, RRR Abd-  +ve B.Sounds, Abd Soft, No tenderness,    Extremity/Skin:- No  edema,   good pedal pulses  Psych-confused, disoriented, agitated from time to time  neuro-thrashing around at times, no new focal deficits, +ve tremors   Data Reviewed: I have personally reviewed following labs and imaging studies  CBC: Recent Labs  Lab 04/05/23 1833  04/06/23 0408 04/07/23 0509  WBC 10.3 5.7 3.7*  NEUTROABS 9.4* 4.4  --   HGB 13.0 11.2* 12.5  HCT 38.8 33.0* 37.8  MCV 92.6 92.4 94.3  PLT 63* 53* 58*    Basic Metabolic Panel: Recent Labs  Lab 04/05/23 1833 04/06/23 0408 04/07/23 0509 04/08/23 0431  NA 137 132* 141 142  K 3.6 3.1* 3.8 3.6  CL 105 103 112* 115*  CO2 16* 19* 20* 19*  GLUCOSE 151* 66* 118* 107*  BUN 8 7 5* 5*  CREATININE 0.74 0.55 0.50 0.48  CALCIUM 10.0 8.8* 9.2 9.7  MG 1.8 1.9 2.3  --     CBG: Recent Labs  Lab 04/06/23 0646 04/07/23 1857  GLUCAP 79 126*    Recent Results (from the past 240 hour(s))  MRSA Next Gen by PCR, Nasal     Status: None   Collection Time: 04/05/23 10:46 PM   Specimen: Nasal Mucosa; Nasal Swab  Result Value Ref Range Status   MRSA by PCR Next Gen NOT DETECTED NOT DETECTED Final    Comment: (NOTE) The GeneXpert MRSA Assay (FDA approved for NASAL specimens only), is one component of a comprehensive MRSA colonization surveillance program. It is not intended to diagnose MRSA infection nor to guide or monitor treatment for MRSA infections. Test performance is not FDA approved in patients less than 37 years old. Performed at [facility], [address], [place], [place] [address]   Culture, blood (Routine X 2) w Reflex to ID Panel     Status: None (Preliminary result)   Collection Time: 04/07/23  7:00 PM   Specimen: BLOOD  Result Value Ref Range Status   Specimen Description BLOOD BLOOD RIGHT ARM  Final   Special Requests   Final    BOTTLES DRAWN AEROBIC AND ANAEROBIC Blood Culture adequate volume   Culture   Final    NO GROWTH < 12 HOURS Performed at [facility], [address], [place], [place] [address]    Report Status PENDING  Incomplete  Culture, blood (Routine X 2) w Reflex to ID Panel     Status: None (Preliminary result)   Collection Time: 04/07/23  7:06 PM   Specimen: BLOOD  Result Value Ref Range Status   Specimen Description BLOOD BLOOD RIGHT ARM  Final    Special Requests   Final    BOTTLES DRAWN AEROBIC AND ANAEROBIC Blood Culture results may not be optimal due to an excessive volume of blood received in culture bottles   Culture   Final    NO GROWTH < 12 HOURS Performed at [facility], [address], [place], [place] [address]    Report Status PENDING  Incomplete     Radiology Studies: DG Chest Port 1 View  Result Date: 04/08/2023 CLINICAL DATA:  [id number] with dyspnea and respiratory abnormality. EXAM:  PORTABLE CHEST 1 VIEW COMPARISON:  PA Lat 12/29/2008 FINDINGS: The heart size and mediastinal contours are within normal limits. Both lungs are clear. The visualized skeletal structures are unremarkable. There is a tangle of overlying monitor wiring. IMPRESSION: No acute radiographic chest findings or changes. Electronically Signed   By: Almira Bar M.D.   On: 04/08/2023 04:43    Scheduled Meds:  amLODipine  10 mg Oral Daily   chlordiazePOXIDE  10 mg Oral TID   Chlorhexidine Gluconate Cloth  6 each Topical Q0600   folic acid  1 mg Oral Daily   multivitamin with minerals  1 tablet Oral Daily   nicotine  14 mg Transdermal Daily   pantoprazole  40 mg Oral Daily   thiamine  100 mg Oral Daily   Or   thiamine  100 mg Intravenous Daily   Continuous Infusions:  0.9 % NaCl with KCl 20 mEq / L 100 mL/hr at 04/08/23 1443   dexmedetomidine (PRECEDEX) IV infusion 1.4 mcg/kg/hr (04/08/23 1230)    LOS: 3 days   Shon Hale, MD How to contact the [facility] Attending or Consulting provider 7A - 7P or covering provider during after hours 7P -7A, for this patient?  Check the care team in [facility] and look for a) attending/consulting TRH provider listed and b) the [facility] team listed Log into [email or web address] and use [place]'s universal password to access. If you do not have the password, please contact the hospital operator. Locate the [facility] provider you are looking for under [facility] and page to a number that you can be directly reached. If you  still have difficulty reaching the provider, please page the [facility] (Director on Call) for the Hospitalists listed on amion for assistance.  04/08/2023, 2:59 PM

## 2023-04-09 DIAGNOSIS — F10931 Alcohol use, unspecified with withdrawal delirium: Secondary | ICD-10-CM | POA: Diagnosis not present

## 2023-04-09 DIAGNOSIS — K219 Gastro-esophageal reflux disease without esophagitis: Secondary | ICD-10-CM | POA: Diagnosis not present

## 2023-04-09 DIAGNOSIS — F17200 Nicotine dependence, unspecified, uncomplicated: Secondary | ICD-10-CM | POA: Diagnosis not present

## 2023-04-09 LAB — COMPREHENSIVE METABOLIC PANEL
ALT: 41 U/L (ref 0–44)
AST: 57 U/L — ABNORMAL HIGH (ref 15–41)
Albumin: 3.5 g/dL (ref 3.5–5.0)
Alkaline Phosphatase: 68 U/L (ref 38–126)
Anion gap: 10 (ref 5–15)
BUN: <5 mg/dL — ABNORMAL LOW (ref 6–20)
CO2: 19 mmol/L — ABNORMAL LOW (ref 22–32)
Calcium: 9.8 mg/dL (ref 8.9–10.3)
Chloride: 112 mmol/L — ABNORMAL HIGH (ref 98–111)
Creatinine, Ser: 0.43 mg/dL — ABNORMAL LOW (ref 0.44–1.00)
GFR, Estimated: >60 mL/min (ref >60)
Glucose, Bld: 90 mg/dL (ref 70–99)
Potassium: 3.1 mmol/L — ABNORMAL LOW (ref 3.5–5.1)
Sodium: 141 mmol/L (ref 135–145)
Total Bilirubin: 0.7 mg/dL (ref 0.3–1.2)
Total Protein: 6.6 g/dL (ref 6.5–8.1)

## 2023-04-09 LAB — CULTURE, BLOOD (ROUTINE X 2)

## 2023-04-09 LAB — GLUCOSE, CAPILLARY: Glucose-Capillary: 101 mg/dL — ABNORMAL HIGH (ref 70–99)

## 2023-04-09 MED ORDER — POTASSIUM CHLORIDE CRYS ER 20 MEQ PO TBCR
40.0000 meq | EXTENDED_RELEASE_TABLET | Freq: Once | ORAL | Status: AC
  Administered 2023-04-09: 40 meq via ORAL
  Filled 2023-04-09: qty 2

## 2023-04-09 MED ORDER — ORAL CARE MOUTH RINSE
15.0000 mL | OROMUCOSAL | Status: DC
  Administered 2023-04-09 – 2023-04-14 (×16): 15 mL via OROMUCOSAL

## 2023-04-09 MED ORDER — ORAL CARE MOUTH RINSE: 15.0000 mL | OROMUCOSAL | Status: DC | PRN

## 2023-04-09 NOTE — Progress Notes (Signed)
PROGRESS NOTE   Heidi [name]  ZOX:[id number] DOB: [birth year] DOA: 04/05/2023 PCP: Tylene Fantasia., PA-C   Chief Complaint  Patient presents with   Seizures   Level of care: ICU  Brief Admission History:  52 year old female alcoholic apparently had a questionable seizure at a friend's house on 04/05/2023.  Patient was confused and combative with EMS and apparently had bit her tongue.  She had been out of alcohol for nonspecified time.  She apparently was feeling shaky when she walked over to the friend's house.  She was feeling very tremulous as well.  She has a hit heavy history of fall consumption.  She also has a history of alcohol withdrawal seizure in the past.  No other injuries associated with current presentation.  Trauma and metabolic workup was unremarkable and she was admitted to the hospital for complications of acute alcohol withdrawal.   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 -episodes  of agitation alternating with episodes of lethargy depending of level of sedation with medications  --Continues to require high doses of continuous IV Precedex drip along with as needed benzos per CIWA protocol in place with current high CIWA score - Tobacco use disorder - Nicotine patch ordered   GERD (gastroesophageal reflux disease) - Continue PPI  Thrombocytopenia- due to a combination of 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 and etoh hepatitis- No Bleeding concerns  Monitor closely   Transaminitis -More consistent with alcoholic hepatitis - Right upper quadrant ultrasound with fatty liver otherwise no acute findings -LFTs  trending down    Latest Ref Rng & Units 04/09/2023    4:11 AM 04/08/2023    4:31 AM 04/07/2023    5:09 AM  Hepatic Function  Total Protein 6.5 - 8.1 g/dL 6.6  6.4  6.7   Albumin 3.5 - 5.0 g/dL 3.5  3.4  3.6   AST 15 - 41 U/L 57  63  61   ALT 0 - 44 U/L 41  45  46   Alk Phosphatase 38 - 126 U/L 68  66  67   Total Bilirubin 0.3 - 1.2 mg/dL 0.7  0.9  1.6    Acute urinary retention--required in and out catheterization, patent to void spontaneously    DVT prophylaxis: scd Code Status: Full  Family Communication: left voicemail for her mother Disposition: Status is: Inpatient Remains inpatient appropriate because: intensity      Subjective: - 04/09/23 -Continues to be in full-blown DTs--with restlessness agitation and tremors -Starting to void spontaneously  Objective: Vitals:   04/09/23 1832 04/09/23 1900 04/09/23 1915 04/09/23 1945  BP: (!) 139/94 (!) 141/109 (!) 163/98   Pulse: 99 93 89   Resp: Temp:    98.5 F (36.9 C)  TempSrc:    Oral  SpO2: 99% 99% 100%   Weight:      Height:        Intake/Output Summary (Last 24 hours) at 04/09/2023 2017 Last data filed at 04/09/2023 1859 Gross per 24 hour  Intake 3311.88 ml  Output 2000 ml  Net 1311.88 ml   Filed Weights   04/05/23 1657 04/05/23 1659 04/05/23 2300  Weight: 62.2  kg 59 kg 60.9 kg   Physical Exam  Gen:- episodes  of agitation alternating with episodes of lethargy depending of level of sedation with medications  HEENT:- [place].AT, No sclera icterus Neck-Supple Neck,No JVD,.  Lungs-  CTAB , fair air movement bilaterally  CV- S1, S2 normal, RRR Abd-  +ve B.Sounds, Abd Soft, No tenderness,    Extremity/Skin:- No  edema,   good pedal pulses  Psych-confused, disoriented, agitated from time to time  neuro-thrashing around at times, no new focal deficits, +ve tremors   Data Reviewed: I have personally reviewed following labs and imaging studies  CBC: Recent Labs  Lab 04/05/23 1833 04/06/23 0408  04/07/23 0509  WBC 10.3 5.7 3.7*  NEUTROABS 9.4* 4.4  --   HGB 13.0 11.2* 12.5  HCT 38.8 33.0* 37.8  MCV 92.6 92.4 94.3  PLT 63* 53* 58*    Basic Metabolic Panel: Recent Labs  Lab 04/05/23 1833 04/06/23 0408 04/07/23 0509 04/08/23 0431 04/09/23 0411  NA 137 132* 141 142 141  K 3.6 3.1* 3.8 3.6 3.1*  CL 105 103 112* 115* 112*  CO2 16* 19* 20* 19* 19*  GLUCOSE 151* 66* 118* 107* 90  BUN 8 7 5* 5* <5*  CREATININE 0.74 0.55 0.50 0.48 0.43*  CALCIUM 10.0 8.8* 9.2 9.7 9.8  MG 1.8 1.9 2.3  --   --     CBG: Recent Labs  Lab 04/06/23 0646 04/07/23 1857 04/09/23 1051  GLUCAP 79 126* 101*    Recent Results (from the past 240 hour(s))  MRSA Next Gen by PCR, Nasal     Status: None   Collection Time: 04/05/23 10:46 PM   Specimen: Nasal Mucosa; Nasal Swab  Result Value Ref Range Status   MRSA by PCR Next Gen NOT DETECTED NOT DETECTED Final    Comment: (NOTE) The GeneXpert MRSA Assay (FDA approved for NASAL specimens only), is one component of a comprehensive MRSA colonization surveillance program. It is not intended to diagnose MRSA infection nor to guide or monitor treatment for MRSA infections. Test performance is not FDA approved in patients less than 10 years old. Performed at [facility], [address], [place], [place] [address]   Culture, blood (Routine X 2) w Reflex to ID Panel     Status: None (Preliminary result)   Collection Time: 04/07/23  7:00 PM   Specimen: BLOOD  Result Value Ref Range Status   Specimen Description BLOOD BLOOD RIGHT ARM  Final   Special Requests   Final    BOTTLES DRAWN AEROBIC AND ANAEROBIC Blood Culture adequate volume   Culture   Final    NO GROWTH 2 DAYS Performed at [facility], [address], [place], [place] [address]    Report Status PENDING  Incomplete  Culture, blood (Routine X 2) w Reflex to ID Panel     Status: None (Preliminary result)   Collection Time: 04/07/23  7:06 PM   Specimen: BLOOD  Result Value Ref Range  Status   Specimen Description BLOOD BLOOD RIGHT ARM  Final   Special Requests   Final    BOTTLES DRAWN AEROBIC AND ANAEROBIC Blood Culture results may not be optimal due to an excessive volume of blood received in culture bottles   Culture   Final    NO GROWTH 2 DAYS Performed at [facility], [address], [place], [place] [address]    Report Status PENDING  Incomplete    Radiology Studies: DG Chest Port 1 View  Result Date:  04/08/2023 CLINICAL DATA:  [id number] with dyspnea and respiratory abnormality. EXAM: PORTABLE CHEST 1 VIEW COMPARISON:  PA Lat 12/29/2008 FINDINGS: The heart size and mediastinal contours are within normal limits. Both lungs are clear. The visualized skeletal structures are unremarkable. There is a tangle of overlying monitor wiring. IMPRESSION: No acute radiographic chest findings or changes. Electronically Signed   By: Almira Bar M.D.   On: 04/08/2023 04:43    Scheduled Meds:  amLODipine  10 mg Oral Daily   chlordiazePOXIDE  10 mg Oral TID   Chlorhexidine Gluconate Cloth  6 each Topical Q0600   folic acid  1 mg Oral Daily   multivitamin with minerals  1 tablet Oral Daily   nicotine  14 mg Transdermal Daily   mouth rinse  15 mL Mouth Rinse 4 times per day   pantoprazole  40 mg Oral Daily   tamsulosin  0.4 mg Oral Daily   thiamine  100 mg Oral Daily   Or   thiamine  100 mg Intravenous Daily   Continuous Infusions:  0.9 % NaCl with KCl 20 mEq / L 100 mL/hr at 04/09/23 1859   dexmedetomidine (PRECEDEX) IV infusion 1.2 mcg/kg/hr (04/09/23 1859)    LOS: 4 days   Shon Hale, MD How to contact the [facility] Attending or Consulting provider 7A - 7P or covering provider during after hours 7P -7A, for this patient?  Check the care team in [facility] and look for a) attending/consulting TRH provider listed and b) the [facility] team listed Log into [email or web address] and use [place]'s universal password to access. If you do not have the password, please contact the hospital  operator. Locate the [facility] provider you are looking for under [facility] and page to a number that you can be directly reached. If you still have difficulty reaching the provider, please page the [facility] (Director on Call) for the Hospitalists listed on amion for assistance.  04/09/2023, 8:17 PM

## 2023-04-09 NOTE — Progress Notes (Signed)
Attempted to place foley as pt continues to retain and need in/out cath, pt became very aggressive and agitated, foley not placed, ativan given, notified Dr. Thomes Dinning.

## 2023-04-10 DIAGNOSIS — K219 Gastro-esophageal reflux disease without esophagitis: Secondary | ICD-10-CM | POA: Diagnosis not present

## 2023-04-10 DIAGNOSIS — R7401 Elevation of levels of liver transaminase levels: Secondary | ICD-10-CM | POA: Diagnosis not present

## 2023-04-10 DIAGNOSIS — F10931 Alcohol use, unspecified with withdrawal delirium: Secondary | ICD-10-CM | POA: Diagnosis not present

## 2023-04-10 DIAGNOSIS — F17200 Nicotine dependence, unspecified, uncomplicated: Secondary | ICD-10-CM | POA: Diagnosis not present

## 2023-04-10 LAB — COMPREHENSIVE METABOLIC PANEL
ALT: 36 U/L (ref 0–44)
AST: 48 U/L — ABNORMAL HIGH (ref 15–41)
Albumin: 3.5 g/dL (ref 3.5–5.0)
Alkaline Phosphatase: 75 U/L (ref 38–126)
Anion gap: 13 (ref 5–15)
BUN: 6 mg/dL (ref 6–20)
CO2: 17 mmol/L — ABNORMAL LOW (ref 22–32)
Calcium: 9.4 mg/dL (ref 8.9–10.3)
Chloride: 107 mmol/L (ref 98–111)
Creatinine, Ser: 0.57 mg/dL (ref 0.44–1.00)
GFR, Estimated: >60 mL/min (ref >60)
Glucose, Bld: 95 mg/dL (ref 70–99)
Potassium: 3.5 mmol/L (ref 3.5–5.1)
Sodium: 137 mmol/L (ref 135–145)
Total Bilirubin: 0.9 mg/dL (ref 0.3–1.2)
Total Protein: 7 g/dL (ref 6.5–8.1)

## 2023-04-10 LAB — CBC
HCT: 36.6 % (ref 36.0–46.0)
Hemoglobin: 12.3 g/dL (ref 12.0–15.0)
MCH: 31.5 pg (ref 26.0–34.0)
MCHC: 33.6 g/dL (ref 30.0–36.0)
MCV: 93.6 fL (ref 80.0–100.0)
Platelets: 96 10*3/uL — ABNORMAL LOW (ref 150–400)
RBC: 3.91 MIL/uL (ref 3.87–5.11)
RDW: 13.5 % (ref 11.5–15.5)
WBC: 6.3 10*3/uL (ref 4.0–10.5)
nRBC: 0 % (ref 0.0–0.2)

## 2023-04-10 NOTE — Progress Notes (Signed)
PROGRESS NOTE   Heidi [name]  ZOX:[id number] DOB: December 19, 1971 DOA: 04/05/2023 PCP: Tylene Fantasia., PA-C   Chief Complaint  Patient presents with   Seizures   Level of care: ICU  Brief Admission History:  52 year old female alcoholic apparently had a questionable seizure at a friend's house on 04/05/2023.  Patient was confused and combative with EMS and apparently had bit her tongue.  She had been out of alcohol for nonspecified time.  She apparently was feeling shaky when she walked over to the friend's house.  She was feeling very tremulous as well.  She has a hit heavy history of fall consumption.  She also has a history of alcohol withdrawal seizure in the past.  No other injuries associated with current presentation.  Trauma and metabolic workup was unremarkable and she was admitted to the hospital for complications of acute alcohol withdrawal.   Assessment and Plan:  Acute Alcohol withdrawal Alcohol withdrawal seizure - Patient reports that she normally drinks a sixpack beer per day - Last drink was approximately 36 hours prior to arrival - Witnessed seizure activity and loss of consciousness reported by friend and EMS reports -Patient reports prior history of alcohol withdrawal seizure 04/10/23 -Patient remains in full-blown DTs -episodes  of agitation alternating with episodes of lethargy depending of level of sedation with medications  --Continues to require high doses of continuous IV Precedex drip along with as needed benzos per CIWA protocol in place with current high CIWA score - Tobacco use disorder - Nicotine patch ordered   GERD (gastroesophageal reflux disease) - Continue PPI  Thrombocytopenia- due to a combination of 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 and etoh hepatitis- No Bleeding concerns  Platelets are improving Monitor closely   Transaminitis -More consistent with alcoholic hepatitis - Right upper quadrant ultrasound with fatty liver otherwise  no acute findings -LFTs trending down    Latest Ref Rng & Units 04/10/2023    8:27 AM 04/09/2023    4:11 AM 04/08/2023    4:31 AM  Hepatic Function  Total Protein 6.5 - 8.1 g/dL 7.0  6.6  6.4   Albumin 3.5 - 5.0 g/dL 3.5  3.5  3.4   AST 15 - 41 U/L 48  57  63   ALT 0 - 44 U/L 36  41  45   Alk Phosphatase 38 - 126 U/L 75  68  66   Total Bilirubin 0.3 - 1.2 mg/dL 0.9  0.7  0.9    Acute urinary retention--required in and out catheterization, patent to void spontaneously    DVT prophylaxis: scd/low platelets Code Status: Full  Family Communication:  I called and updated her mother on 04/10/23 Disposition: Status is: Inpatient Remains inpatient appropriate because: intensity      Subjective: - 04/10/23 -Continues to be in full-blown DTs--with restlessness agitation and tremors -continues to require Benzos and iv Precedex No fevers No Nausea, Vomiting or Diarrhea -I called and updated her mother on 04/10/23  Objective: Vitals:   04/10/23 1300 04/10/23 1600 04/10/23 1621 04/10/23 1700  BP: (!) 150/102 (!) 143/97  130/88  Pulse: 95 (!) 29  (!) 135  Resp: 20 (!) Temp:   (!) 100.5 F (38.1 C)   TempSrc:   Axillary   SpO2: 98% (!) 86%  100%  Weight:      Height:        Intake/Output Summary (Last 24 hours) at 04/10/2023 1817 Last data filed at 04/10/2023 1621 Gross  per 24 hour  Intake 2695.41 ml  Output 1300 ml  Net 1395.41 ml   Filed Weights   04/05/23 1659 04/05/23 2300 04/10/23 0438  Weight: 59 kg 60.9 kg 64.9 kg   Physical Exam  Gen:- episodes  of agitation alternating with episodes of lethargy depending of level of sedation with medications  HEENT:- [place].AT, No sclera icterus Neck-Supple Neck,No JVD,.  Lungs-  CTAB , fair air movement bilaterally  CV- S1, S2 normal, RRR Abd-  +ve B.Sounds, Abd Soft, No tenderness,    Extremity/Skin:- No  edema,   good pedal pulses  Psych-confused, disoriented, agitated from time to time  neuro-thrashing around at times,  no new focal deficits, +ve tremors   Data Reviewed: I have personally reviewed following labs and imaging studies  CBC: Recent Labs  Lab 04/05/23 1833 04/06/23 0408 04/07/23 0509 04/10/23 0503  WBC 10.3 5.7 3.7* 6.3  NEUTROABS 9.4* 4.4  --   --   HGB 13.0 11.2* 12.5 12.3  HCT 38.8 33.0* 37.8 36.6  MCV 92.6 92.4 94.3 93.6  PLT 63* 53* 58* 96*    Basic Metabolic Panel: Recent Labs  Lab 04/05/23 1833 04/06/23 0408 04/07/23 0509 04/08/23 0431 04/09/23 0411 04/10/23 0827  NA 137 132* 141 142 141 137  K 3.6 3.1* 3.8 3.6 3.1* 3.5  CL 105 103 112* 115* 112* 107  CO2 16* 19* 20* 19* 19* 17*  GLUCOSE 151* 66* 118* 107* 90 95  BUN 8 7 5* 5* <5* 6  CREATININE 0.74 0.55 0.50 0.48 0.43* 0.57  CALCIUM 10.0 8.8* 9.2 9.7 9.8 9.4  MG 1.8 1.9 2.3  --   --   --     CBG: Recent Labs  Lab 04/06/23 0646 04/07/23 1857 04/09/23 1051  GLUCAP 79 126* 101*    Recent Results (from the past 240 hour(s))  MRSA Next Gen by PCR, Nasal     Status: None   Collection Time: 04/05/23 10:46 PM   Specimen: Nasal Mucosa; Nasal Swab  Result Value Ref Range Status   MRSA by PCR Next Gen NOT DETECTED NOT DETECTED Final    Comment: (NOTE) The GeneXpert MRSA Assay (FDA approved for NASAL specimens only), is one component of a comprehensive MRSA colonization surveillance program. It is not intended to diagnose MRSA infection nor to guide or monitor treatment for MRSA infections. Test performance is not FDA approved in patients less than 48 years old. Performed at [facility], [address] [place], [place] [address]   Culture, blood (Routine X 2) w Reflex to ID Panel     Status: None (Preliminary result)   Collection Time: 04/07/23  7:00 PM   Specimen: BLOOD  Result Value Ref Range Status   Specimen Description BLOOD BLOOD RIGHT ARM  Final   Special Requests   Final    BOTTLES DRAWN AEROBIC AND ANAEROBIC Blood Culture adequate volume   Culture   Final    NO GROWTH 2 DAYS Performed at [facility], [address], [place], [place] [address]    Report Status PENDING  Incomplete  Culture, blood (Routine X 2) w Reflex to ID Panel     Status: None (Preliminary result)   Collection Time: 04/07/23  7:06 PM   Specimen: BLOOD  Result Value Ref Range Status   Specimen Description BLOOD BLOOD RIGHT ARM  Final   Special Requests   Final    BOTTLES DRAWN AEROBIC AND ANAEROBIC Blood Culture results may not be optimal due to an  excessive volume of blood received in culture bottles   Culture   Final    NO GROWTH 2 DAYS Performed at [facility], [address] [place], [place] [address]    Report Status PENDING  Incomplete    Radiology Studies: No results found.  Scheduled Meds:  amLODipine  10 mg Oral Daily   chlordiazePOXIDE  10 mg Oral TID   Chlorhexidine Gluconate Cloth  6 each Topical Q0600   folic acid  1 mg Oral Daily   multivitamin with minerals  1 tablet Oral Daily   nicotine  14 mg Transdermal Daily   mouth rinse  15 mL Mouth Rinse 4 times per day   pantoprazole  40 mg Oral Daily   tamsulosin  0.4 mg Oral Daily   thiamine  100 mg Oral Daily   Or   thiamine  100 mg Intravenous Daily   Continuous Infusions:  0.9 % NaCl with KCl 20 mEq / L 100 mL/hr at 04/10/23 0757   dexmedetomidine (PRECEDEX) IV infusion 1 mcg/kg/hr (04/10/23 1233)    LOS: 5 days   Shon Hale, MD How to contact the [facility] Attending or Consulting provider 7A - 7P or covering provider during after hours 7P -7A, for this patient?  Check the care team in [facility] and look for a) attending/consulting TRH provider listed and b) the [facility] team listed Log into [email or web address] and use [place]'s universal password to access. If you do not have the password, please contact the hospital operator. Locate the [facility] provider you are looking for under [facility] and page to a number that you can be directly reached. If you still have difficulty reaching the provider, please page the [facility] (Director on Call) for  the Hospitalists listed on amion for assistance.  04/10/2023, 6:17 PM

## 2023-04-11 DIAGNOSIS — K219 Gastro-esophageal reflux disease without esophagitis: Secondary | ICD-10-CM | POA: Diagnosis not present

## 2023-04-11 DIAGNOSIS — F10931 Alcohol use, unspecified with withdrawal delirium: Secondary | ICD-10-CM | POA: Diagnosis not present

## 2023-04-11 DIAGNOSIS — F17200 Nicotine dependence, unspecified, uncomplicated: Secondary | ICD-10-CM | POA: Diagnosis not present

## 2023-04-11 LAB — CULTURE, BLOOD (ROUTINE X 2): Culture: NO GROWTH

## 2023-04-11 NOTE — Progress Notes (Signed)
PROGRESS NOTE   Heidi [name]  WUJ:[id number] DOB: 1971/08/16 DOA: 04/05/2023 PCP: Tylene Fantasia., PA-C   Chief Complaint  Patient presents with   Seizures   Level of care: ICU  Brief Admission History:  52 year old female alcoholic apparently had a questionable seizure at a friend's house on 04/05/2023.  Patient was confused and combative with EMS and apparently had bit her tongue.  She had been out of alcohol for nonspecified time.  She apparently was feeling shaky when she walked over to the friend's house.  She was feeling very tremulous as well.  She has a hit heavy history of fall consumption.  She also has a history of alcohol withdrawal seizure in the past.  No other injuries associated with current presentation.  Trauma and metabolic workup was unremarkable and she was admitted to the hospital for complications of acute alcohol withdrawal.   Assessment and Plan:  Acute Alcohol withdrawal Alcohol withdrawal seizure - Patient reports that she normally drinks a sixpack beer per day - Last drink was approximately 36 hours prior to arrival - Witnessed seizure activity and loss of consciousness reported by friend and EMS reports -Patient reports prior history of alcohol withdrawal seizure 04/11/23 -Patient remains in full-blown DTs, however DT symptoms appear to be improving somewhat- -patient is now able to answer simple questions -IV Precedex has been weaned down to 0.6 from 1.1 -Continue needed benzos per CIWA protocol  - Tobacco use disorder - Nicotine patch ordered   GERD (gastroesophageal reflux disease) - Continue PPI  Thrombocytopenia- due to a combination of 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 and etoh hepatitis- No Bleeding concerns  Platelets are improving Monitor closely   Transaminitis -More consistent with alcoholic hepatitis - Right upper quadrant ultrasound with fatty liver otherwise no acute findings -LFTs trending down  Acute urinary  retention--required in and out catheterization, voiding spontaneously    DVT prophylaxis: scd/low platelets Code Status: Full  Family Communication:  I called and updated her mother   Disposition: Home after resolution of DTs  Status is: Inpatient Remains inpatient appropriate because: intensity      Subjective: - 04/11/23 -Patient remains in full-blown DTs, however DT symptoms appear to be improving somewhat- -patient is now able to answer simple questions -Tmax 100.5--- denies urinary symptoms -Voiding okay  Objective: Vitals:   04/11/23 0500 04/11/23 0600 04/11/23 0729 04/11/23 1143  BP: (!) 145/92 (!) 178/108    Pulse: 77 70 87 91  Resp: (!) 22  Temp:   (!) 97.5 F (36.4 C) 97.9 F (36.6 C)  TempSrc:   Oral Axillary  SpO2: 99% 100% 99% 99%  Weight:      Height:        Intake/Output Summary (Last 24 hours) at 04/11/2023 1145 Last data filed at 04/11/2023 0912 Gross per 24 hour  Intake 2993.49 ml  Output 3025 ml  Net -31.51 ml   Filed Weights   04/05/23 2300 04/10/23 0438 04/11/23 0417  Weight: 60.9 kg 64.9 kg 63.1 kg   Physical Exam  Gen:-More alert, more coherent,  more cooperative   HEENT:- [place].AT, No sclera icterus Neck-Supple Neck,No JVD,.  Lungs-  CTAB , fair air movement bilaterally  CV- S1, S2 normal, RRR Abd-  +ve B.Sounds, Abd Soft, No tenderness,    Extremity/Skin:- No  edema,   good pedal pulses  Psych-less confused confused, much less agitated from time to time  neuro-- no new focal deficits, improving +ve tremors  Data Reviewed: I have personally reviewed following labs and imaging studies  CBC: Recent Labs  Lab 04/05/23 1833 04/06/23 0408 04/07/23 0509 04/10/23 0503  WBC 10.3 5.7 3.7* 6.3  NEUTROABS 9.4* 4.4  --   --   HGB 13.0 11.2* 12.5 12.3  HCT 38.8 33.0* 37.8 36.6  MCV 92.6 92.4 94.3 93.6  PLT 63* 53* 58* 96*    Basic Metabolic Panel: Recent Labs  Lab 04/05/23 1833 04/06/23 0408 04/07/23 0509 04/08/23 0431  04/09/23 0411 04/10/23 0827  NA 137 132* 141 142 141 137  K 3.6 3.1* 3.8 3.6 3.1* 3.5  CL 105 103 112* 115* 112* 107  CO2 16* 19* 20* 19* 19* 17*  GLUCOSE 151* 66* 118* 107* 90 95  BUN 8 7 5* 5* <5* 6  CREATININE 0.74 0.55 0.50 0.48 0.43* 0.57  CALCIUM 10.0 8.8* 9.2 9.7 9.8 9.4  MG 1.8 1.9 2.3  --   --   --     CBG: Recent Labs  Lab 04/06/23 0646 04/07/23 1857 04/09/23 1051  GLUCAP 79 126* 101*    Recent Results (from the past 240 hour(s))  MRSA Next Gen by PCR, Nasal     Status: None   Collection Time: 04/05/23 10:46 PM   Specimen: Nasal Mucosa; Nasal Swab  Result Value Ref Range Status   MRSA by PCR Next Gen NOT DETECTED NOT DETECTED Final    Comment: (NOTE) The GeneXpert MRSA Assay (FDA approved for NASAL specimens only), is one component of a comprehensive MRSA colonization surveillance program. It is not intended to diagnose MRSA infection nor to guide or monitor treatment for MRSA infections. Test performance is not FDA approved in patients less than 31 years old. Performed at [facility], [address] [place], [place] [address]   Culture, blood (Routine X 2) w Reflex to ID Panel     Status: None (Preliminary result)   Collection Time: 04/07/23  7:00 PM   Specimen: BLOOD  Result Value Ref Range Status   Specimen Description BLOOD BLOOD RIGHT ARM  Final   Special Requests   Final    BOTTLES DRAWN AEROBIC AND ANAEROBIC Blood Culture adequate volume   Culture   Final    NO GROWTH 4 DAYS Performed at [facility], [address] [place], [place] [address]    Report Status PENDING  Incomplete  Culture, blood (Routine X 2) w Reflex to ID Panel     Status: None (Preliminary result)   Collection Time: 04/07/23  7:06 PM   Specimen: BLOOD  Result Value Ref Range Status   Specimen Description BLOOD BLOOD RIGHT ARM  Final   Special Requests   Final    BOTTLES DRAWN AEROBIC AND ANAEROBIC Blood Culture results may not be optimal due to an excessive volume of blood  received in culture bottles   Culture   Final    NO GROWTH 4 DAYS Performed at [facility], [address] [place], [place] [address]    Report Status PENDING  Incomplete    Radiology Studies: No results found.  Scheduled Meds:  amLODipine  10 mg Oral Daily   chlordiazePOXIDE  10 mg Oral TID   Chlorhexidine Gluconate Cloth  6 each Topical Q0600   folic acid  1 mg Oral Daily   multivitamin with minerals  1 tablet Oral Daily   nicotine  14 mg Transdermal Daily   mouth rinse  15 mL Mouth Rinse 4 times per day   pantoprazole  40 mg  Oral Daily   tamsulosin  0.4 mg Oral Daily   thiamine  100 mg Oral Daily   Or   thiamine  100 mg Intravenous Daily   Continuous Infusions:  0.9 % NaCl with KCl 20 mEq / L 100 mL/hr at 04/11/23 0458   dexmedetomidine (PRECEDEX) IV infusion 0.4 mcg/kg/hr (04/11/23 1021)    LOS: 6 days   Shon Hale, MD How to contact the [facility] Attending or Consulting provider 7A - 7P or covering provider during after hours 7P -7A, for this patient?  Check the care team in [facility] and look for a) attending/consulting TRH provider listed and b) the [facility] team listed Log into [email or web address] and use [place]'s universal password to access. If you do not have the password, please contact the hospital operator. Locate the [facility] provider you are looking for under [facility] and page to a number that you can be directly reached. If you still have difficulty reaching the provider, please page the [facility] (Director on Call) for the Hospitalists listed on amion for assistance.  04/11/2023, 11:45 AM

## 2023-04-12 DIAGNOSIS — R7401 Elevation of levels of liver transaminase levels: Secondary | ICD-10-CM | POA: Diagnosis not present

## 2023-04-12 DIAGNOSIS — F10931 Alcohol use, unspecified with withdrawal delirium: Secondary | ICD-10-CM | POA: Diagnosis not present

## 2023-04-12 DIAGNOSIS — K219 Gastro-esophageal reflux disease without esophagitis: Secondary | ICD-10-CM | POA: Diagnosis not present

## 2023-04-12 DIAGNOSIS — F17200 Nicotine dependence, unspecified, uncomplicated: Secondary | ICD-10-CM | POA: Diagnosis not present

## 2023-04-12 LAB — COMPREHENSIVE METABOLIC PANEL
ALT: 26 U/L (ref 0–44)
AST: 34 U/L (ref 15–41)
Albumin: 3.3 g/dL — ABNORMAL LOW (ref 3.5–5.0)
Alkaline Phosphatase: 62 U/L (ref 38–126)
Anion gap: 7 (ref 5–15)
BUN: 5 mg/dL — ABNORMAL LOW (ref 6–20)
CO2: 21 mmol/L — ABNORMAL LOW (ref 22–32)
Calcium: 9.2 mg/dL (ref 8.9–10.3)
Chloride: 109 mmol/L (ref 98–111)
Creatinine, Ser: 0.6 mg/dL (ref 0.44–1.00)
GFR, Estimated: >60 mL/min (ref >60)
Glucose, Bld: 114 mg/dL — ABNORMAL HIGH (ref 70–99)
Potassium: 3.4 mmol/L — ABNORMAL LOW (ref 3.5–5.1)
Sodium: 137 mmol/L (ref 135–145)
Total Bilirubin: 0.5 mg/dL (ref 0.3–1.2)
Total Protein: 6.5 g/dL (ref 6.5–8.1)

## 2023-04-12 LAB — CBC
HCT: 34.3 % — ABNORMAL LOW (ref 36.0–46.0)
Hemoglobin: 11.3 g/dL — ABNORMAL LOW (ref 12.0–15.0)
MCH: 31 pg (ref 26.0–34.0)
MCHC: 32.9 g/dL (ref 30.0–36.0)
MCV: 94 fL (ref 80.0–100.0)
Platelets: 141 10*3/uL — ABNORMAL LOW (ref 150–400)
RBC: 3.65 MIL/uL — ABNORMAL LOW (ref 3.87–5.11)
RDW: 13.5 % (ref 11.5–15.5)
WBC: 5.5 10*3/uL (ref 4.0–10.5)
nRBC: 0 % (ref 0.0–0.2)

## 2023-04-12 LAB — CULTURE, BLOOD (ROUTINE X 2)
Culture: NO GROWTH
Special Requests: ADEQUATE

## 2023-04-12 MED ORDER — THIAMINE HCL 100 MG/ML IJ SOLN: 100.0000 mg | Freq: Every day | INTRAMUSCULAR | Status: DC

## 2023-04-12 MED ORDER — LORAZEPAM 1 MG PO TABS
1.0000 mg | ORAL_TABLET | ORAL | Status: DC | PRN
  Administered 2023-04-14 (×2): 1 mg via ORAL
  Filled 2023-04-12 (×2): qty 1

## 2023-04-12 MED ORDER — LORAZEPAM 2 MG/ML IJ SOLN
1.0000 mg | INTRAMUSCULAR | Status: DC | PRN
  Administered 2023-04-12: 4 mg via INTRAVENOUS
  Administered 2023-04-12: 2 mg via INTRAVENOUS
  Filled 2023-04-12: qty 2
  Filled 2023-04-12 (×2): qty 1

## 2023-04-12 MED ORDER — THIAMINE MONONITRATE 100 MG PO TABS: 100.0000 mg | ORAL_TABLET | Freq: Every day | ORAL | Status: DC

## 2023-04-12 MED ORDER — POTASSIUM CHLORIDE CRYS ER 20 MEQ PO TBCR
40.0000 meq | EXTENDED_RELEASE_TABLET | ORAL | Status: AC
  Administered 2023-04-12 (×2): 40 meq via ORAL
  Filled 2023-04-12 (×2): qty 2

## 2023-04-12 MED ORDER — FOLIC ACID 1 MG PO TABS: 1.0000 mg | ORAL_TABLET | Freq: Every day | ORAL | Status: DC

## 2023-04-12 MED ORDER — ADULT MULTIVITAMIN W/MINERALS CH: 1.0000 | ORAL_TABLET | Freq: Every day | ORAL | Status: DC

## 2023-04-12 NOTE — Progress Notes (Signed)
Patient was very tremulous this morning at shift change. Patient alert and wanting to go home. Dr Marisa Severin in to assess patient. CIWA score of 28. Ativan ordered per ciwa scoring.  Writer removed foley catheter and assisted patient to bedside commode with contact assist due to patient being a little unsteady on feet. Precedex restarted due to patient requiring PRN Ativan, tremors, agitation and anxiety. Dr Marisa Severin made aware.

## 2023-04-12 NOTE — Evaluation (Signed)
Physical Therapy Evaluation Patient Details Name: Heidi [name] Today's Date: 04/12/2023  History of Present Illness  Heidi [name] is a 52 y.o. female with medical history significant of hypertension, alcohol use disorder, tobacco use disorder, and more presents the ED chief complaint of seizure.  Patient reports that she members getting up and walking around the house, then she fell.  She is very poor historian, constantly falling asleep outlined the history, mumbling, and quite honestly I do not think she remembers much of it.  She reports she did lose consciousness.  She is not sure if she hit her head.  She reports somebody told her she is having a seizure.  She reports she quit drinking in the past for 8 months and did not have seizures at that time.  She was not able to answer if she noticed herself being tremulous before that.  She has been nauseous, having palpitation, having chest pain etc.  Patient just kept falling asleep before answering his questions.  Eventually she started falling asleep before the question was fully repeated.  This is likely due to her Ativan, which is necessary for her withdrawal symptoms.  Unfortunately, it means I did not get much of a history.   Clinical Impression  Patient very unsteady on feet with frequent near loss of balance when attempting transfers, walking without AD, required use of RW for safety and able to ambulate in room/hallway without loss of balance.  Patient put back to bed after therapy.  Patient will benefit from continued skilled physical therapy in hospital and recommended venue below to increase strength, balance, endurance for safe ADLs and gait.        Recommendations for follow up therapy are one component of a multi-disciplinary discharge planning process, led by the attending physician.  Recommendations may be updated based on patient status, additional functional criteria and insurance  authorization.  Follow Up Recommendations       Assistance Recommended at Discharge Intermittent Supervision/Assistance  Patient can return home with the following  A little help with walking and/or transfers;A little help with bathing/dressing/bathroom;Help with stairs or ramp for entrance;Assistance with cooking/housework    Equipment Recommendations Rolling walker (2 wheels);BSC/3in1  Recommendations for Other Services       Functional Status Assessment Patient has had a recent decline in their functional status and demonstrates the ability to make significant improvements in function in a reasonable and predictable amount of time.     Precautions / Restrictions Precautions Precautions: Fall Restrictions Weight Bearing Restrictions: No      Mobility  Bed Mobility Overal bed mobility: Needs Assistance Bed Mobility: Supine to Sit, Sit to Supine     Supine to sit: Supervision Sit to supine: Supervision   General bed mobility comments: labored movement    Transfers Overall transfer level: Needs assistance Equipment used: Rolling walker (2 wheels), 1 person hand held assist Transfers: Sit to/from Stand, Bed to chair/wheelchair/BSC Sit to Stand: Min guard, Min assist   Step pivot transfers: Min guard, Min assist       General transfer comment: very unsteady on feet with near fall without AD, required use of RW for safety    Ambulation/Gait Ambulation/Gait assistance: Min guard, Min assist Gait Distance (Feet): 75 Feet Assistive device: Rolling walker (2 wheels) Gait Pattern/deviations: Decreased step length - left, Decreased stance time - right, Decreased stride length Gait velocity: decreased     General Gait Details: unsteady labored cadence without loss of balance  using RW, limited due to fatigue  Stairs            Wheelchair Mobility    Modified Rankin (Stroke Patients Only)       Balance Overall balance assessment: Needs  assistance Sitting-balance support: Feet supported, No upper extremity supported Sitting balance-Leahy Scale: Fair Sitting balance - Comments: fair/good seated at EOB   Standing balance support: During functional activity, No upper extremity supported Standing balance-Leahy Scale: Poor Standing balance comment: fair/good using RW                             Pertinent Vitals/Pain Pain Assessment Pain Assessment: No/denies pain    Home Living Family/patient expects to be discharged to:: Private residence Living Arrangements: Parent Available Help at Discharge: Family;Available PRN/intermittently Type of Home: House Home Access: Ramped entrance       Home Layout: One level Home Equipment: None      Prior Function               Mobility Comments: [place], does not drive ADLs Comments: Independent     Hand Dominance        Extremity/Trunk Assessment   Upper Extremity Assessment Upper Extremity Assessment: Overall WFL for tasks assessed    Lower Extremity Assessment Lower Extremity Assessment: Generalized weakness    Cervical / Trunk Assessment Cervical / Trunk Assessment: Normal  Communication   Communication: No difficulties  Cognition Arousal/Alertness: Awake/alert Behavior During Therapy: WFL for tasks assessed/performed, Impulsive Overall Cognitive Status: Within Functional Limits for tasks assessed                                          General Comments      Exercises     Assessment/Plan    PT Assessment Patient needs continued PT services  PT Problem List Decreased strength;Decreased activity tolerance;Decreased balance;Decreased mobility       PT Treatment Interventions DME instruction;Gait training;Stair training;Functional mobility training;Therapeutic activities;Therapeutic exercise;Patient/family education;Balance training    PT Goals (Current goals can be found in the Care Plan section)  Acute  Rehab PT Goals Patient Stated Goal: return home with family, friends to assist PT Goal Formulation: With patient Time For Goal Achievement: 04/19/23 Potential to Achieve Goals: Good    Frequency Min 3X/week     Co-evaluation               AM-PAC PT "6 Clicks" Mobility  Outcome Measure Help needed turning from your back to your side while in a flat bed without using bedrails?: None Help needed moving from lying on your back to sitting on the side of a flat bed without using bedrails?: A Little Help needed moving to and from a bed to a chair (including a wheelchair)?: A Little Help needed standing up from a chair using your arms (e.g., wheelchair or bedside chair)?: A Little Help needed to walk in hospital room?: A Little Help needed climbing 3-5 steps with a railing? : A Lot 6 Click Score: 18    End of Session   Activity Tolerance: Patient tolerated treatment well;Patient limited by fatigue Patient left: in bed;with call bell/phone within reach Nurse Communication: Mobility status PT Visit Diagnosis: Unsteadiness on feet (R26.81);Other abnormalities of gait and mobility (R26.89);Muscle weakness (generalized) (M62.81)    Time: 1610-9604 PT Time Calculation (min) (ACUTE ONLY): 30 min  Charges:   PT Evaluation $PT Eval Moderate Complexity: 1 Mod PT Treatments $Therapeutic Activity: 23-37 mins        3:52 PM, 04/12/23 [name], MPT Physical Therapist with [facility] 336 [phone] office [phone] mobile phone

## 2023-04-12 NOTE — Discharge Instructions (Addendum)
Crisis Mobile: [facility]rapeutic Alternatives: [phone] (for crisis response 24 hours a day) [facility] [phone] Outpatient Substance Use Treatment Services  [facility] [address] [place], [place] [address] [phone] Private insurance, Medicare A&B, and [facility]  [facility] (Alcohol and Drug Services) [address], [place], [place] [address] [phone] Medicaid, Self Pay  [facility] [address] Leonard Schwartz [place], [place] [phone] Medicaid and [facility], Self Pay  [facility] [address] [place], [place] [phone] [facility], and Self Pay  [facility] [address] [place], [place] [address] [phone] or [phone] Private Insurance Only  [facility] [address] Ginette Otto, [place] [address] [phone] Medicaid, Medicare, Private Insurance  [facility] Counseling Services at the [facility] [address] [place], [place] [address] [phone] Services are free or reduced  [facility] [address] [phone] Self Pay only, sliding scale [facility] [address] [place], [place] [address] [phone] ([facility]) Self Pay, Medicaid Only  [facility] [address], [place] [address] [phone] Medicaid, Medicare, Private Insurance Crisis Mobile: [facility]: [phone] (for crisis response 24 hours a day)  [facility] ([facility] [address] [place], [place] [address] [phone] or [phone] Detox (Medicare, Medicaid, private insurance, and self pay) Residential Rehab 14 days (Medicare, Medicaid, private insurance, and self pay)  [facility] (Residential Treatment Services) [address] [place], [place] [phone] Female and Female Detox  (Self Pay and Medicaid limited availability) Rehab only Female (Medicaid and self pay only)  [facility] [address] [place], [place] [address] [phone] or [phone] Detox and Residential Treatment Private Insurance Only  [facility] [address] [place], [place] [address] [phone] Treatment Only, must make assessment appointment, and must be sober for assessment appointment. Self Pay Only, Medicare A&B, [facility] [place] ID only! *Transportation assistance offered from [place] [facility] [employer] [address] [place], [place] [address] Walk in interviews M-Sat 8-4p No pending legal charges [phone]  [facility]: [facility] Referral [address] [place], [place] [phone] (Self Pay, [facility])  [facility] [address] [place], [place] [address] [phone] Detox and Residential Treatment Medicare and Private Insurance  [facility] [address] [place], [place] [address] 28 Day Women's Facility: [phone] 28 Day Men's Facility: [phone]  Long-term Residential Program:  [phone] Males 25 and Over (No Insurance, upfront fee) [facility] [address] [place], [place] [address] [phone] Private Insurance with [place], Private Pay  [facility] [address] [place], [place] [address] Local [phone] Private Insurance Only  [facility] [address] [place], [place] [address] [phone] (Males, upfront fee)  [facility] [address] [place], [address] [phone] Private Insurance  [facility] Locations [facility] [address] [place], [place] [phone] [facility] Program for individuals experiencing homelessness Self Pay, No insurance  [facility] Men's program: [facility] [address] [place], [place] [address] [phone]  [facility] Women's program:  [facility] [address] [place], [place] [address] [phone] Christian Based Program for individuals experiencing homelessness Self Pay, No insurance  [facility] [address] [place], [place] [address] [phone] [facility] Program for individuals experiencing homelessness Self Pay, No insurance  [facility] Women's Division [address] [place], [place] [address] [phone]  [facility] Based Program for individuals experiencing homelessness Self Pay, No insurance  [facility] [address] [place], [place] [phone] Christian Based Program for males experiencing homelessness Self Pay, No insurance 

## 2023-04-12 NOTE — Progress Notes (Addendum)
PROGRESS NOTSHANINA [name] T [name]  ZOX:[id number] DOB: 1971/05/17 DOA: 04/05/2023 PCP: Tylene Fantasia., PA-C   Chief Complaint  Patient presents with   Seizures   Level of care: ICU  Brief Admission History:  52 year old female alcoholic apparently had a questionable seizure at a friend's house on 04/05/2023.  Patient was confused and combative with EMS and apparently had bit her tongue.  She had been out of alcohol for nonspecified time.  She apparently was feeling shaky when she walked over to the friend's house.  She was feeling very tremulous as well.  She has a hit heavy history of fall consumption.  She also has a history of alcohol withdrawal seizure in the past.  No other injuries associated with current presentation.  Trauma and metabolic workup was unremarkable and she was admitted to the hospital for complications of acute alcohol withdrawal.   Assessment and Plan:  Acute Alcohol withdrawal Alcohol withdrawal seizure - Patient reports that she normally drinks a sixpack beer per day - Last drink was approximately 36 hours prior to arrival - Witnessed seizure activity and loss of consciousness reported by friend and EMS reports -Patient reports prior history of alcohol withdrawal seizure 04/12/23 -DT symptoms were improving so Precedex was DC'd -Patient started to have confusion and hallucinations again -She is back on IV Precedex -patient is now able to answer simple questions -Continue needed benzos per CIWA protocol  - Tobacco use disorder - Nicotine patch ordered   GERD (gastroesophageal reflux disease) - Continue PPI  Thrombocytopenia- due to a combination of 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 and etoh hepatitis- No Bleeding concerns  Platelets are improving Monitor closely   Transaminitis -More consistent with alcoholic hepatitis - Right upper quadrant ultrasound with fatty liver otherwise no acute findings -LFTs trending down    Latest Ref Rng &  Units 04/12/2023    5:29 AM 04/10/2023    8:27 AM 04/09/2023    4:11 AM  Hepatic Function  Total Protein 6.5 - 8.1 g/dL 6.5  7.0  6.6   Albumin 3.5 - 5.0 g/dL 3.3  3.5  3.5   AST 15 - 41 U/L 34  48  57   ALT 0 - 44 U/L 26  36  41   Alk Phosphatase 38 - 126 U/L 62  75  68   Total Bilirubin 0.3 - 1.2 mg/dL 0.5  0.9  0.7      Acute urinary retention--required in and out catheterization, with subsequent placement of indwelling Foley due to persistent urinary retention -Foley removed on 04/12/2023--- voiding spontaneously after Foley removal  Generalized weakness and deconditioning--- physical therapy eval appreciated recommends home health PT and equipment  CRITICAL CARE Performed by: Shon Hale  Total critical care time: 49 minutes  Critical care time was exclusive of separately billable procedures and treating other patients.  Confusional episodes, disorientation and agitation from time to time -Requiring IV Precedex drip and IV benzos  Critical care was necessary to treat or prevent imminent or life-threatening deterioration.  Critical care was time spent personally by me on the following activities: development of treatment plan with patient and/or surrogate as well as nursing, discussions with consultants, evaluation of patient's response to treatment, examination of patient, obtaining history from patient or surrogate, ordering and performing treatments and interventions, ordering and review of laboratory studies, ordering and review of radiographic studies, pulse oximetry and re-evaluation of patient's condition.    DVT prophylaxis: scd/low platelets Code Status: Full  Family Communication:  I called and updated her mother   Disposition: Home with home health services after resolution of DTs  Status is: Inpatient Remains inpatient appropriate because: intensity      Subjective: - 04/12/23 -No fevers -DT symptoms were improving so Precedex was DC'd -Patient started  to have confusion and hallucinations again -She is back on IV Precedex -patient is now able to answer simple questions  Objective: Vitals:   04/12/23 1600 04/12/23 1608 04/12/23 1630 04/12/23 1700  BP: 121/85  132/88 121/84  Pulse: (!) 101 91 88   Resp: 19   13  Temp:  98.8 F (37.1 C)    TempSrc:  Oral    SpO2: 100%  100% 100%  Weight:      Height:        Intake/Output Summary (Last 24 hours) at 04/12/2023 1745 Last data filed at 04/12/2023 1733 Gross per 24 hour  Intake 3310.06 ml  Output 2200 ml  Net 1110.06 ml   Filed Weights   04/10/23 0438 04/11/23 0417 04/12/23 0500  Weight: 64.9 kg 63.1 kg 63.1 kg   Physical Exam  Gen:-Confusional episodes, disorientation and agitation from time to time HEENT:- [place].AT, No sclera icterus Neck-Supple Neck,No JVD,.  Lungs-  CTAB , fair air movement bilaterally  CV- S1, S2 normal, RRR Abd-  +ve B.Sounds, Abd Soft, No tenderness,    Extremity/Skin:- No  edema,   good pedal pulses  Psych-Confusional episodes, disorientation and agitation from time to time neuro-- no new focal deficits, improving +ve tremors  Data Reviewed: I have personally reviewed following labs and imaging studies  CBC: Recent Labs  Lab 04/05/23 1833 04/06/23 0408 04/07/23 0509 04/10/23 0503 04/12/23 0529  WBC 10.3 5.7 3.7* 6.3 5.5  NEUTROABS 9.4* 4.4  --   --   --   HGB 13.0 11.2* 12.5 12.3 11.3*  HCT 38.8 33.0* 37.8 36.6 34.3*  MCV 92.6 92.4 94.3 93.6 94.0  PLT 63* 53* 58* 96* 141*    Basic Metabolic Panel: Recent Labs  Lab 04/05/23 1833 04/06/23 0408 04/07/23 0509 04/08/23 0431 04/09/23 0411 04/10/23 0827 04/12/23 0529  NA 137 132* 141 142 141 137 137  K 3.6 3.1* 3.8 3.6 3.1* 3.5 3.4*  CL 105 103 112* 115* 112* 107 109  CO2 16* 19* 20* 19* 19* 17* 21*  GLUCOSE 151* 66* 118* 107* 90 95 114*  BUN 8 7 5* 5* <5* 6 5*  CREATININE 0.74 0.55 0.50 0.48 0.43* 0.57 0.60  CALCIUM 10.0 8.8* 9.2 9.7 9.8 9.4 9.2  MG 1.8 1.9 2.3  --   --   --   --      CBG: Recent Labs  Lab 04/06/23 0646 04/07/23 1857 04/09/23 1051  GLUCAP 79 126* 101*    Recent Results (from the past 240 hour(s))  MRSA Next Gen by PCR, Nasal     Status: None   Collection Time: 04/05/23 10:46 PM   Specimen: Nasal Mucosa; Nasal Swab  Result Value Ref Range Status   MRSA by PCR Next Gen NOT DETECTED NOT DETECTED Final    Comment: (NOTE) The GeneXpert MRSA Assay (FDA approved for NASAL specimens only), is one component of a comprehensive MRSA colonization surveillance program. It is not intended to diagnose MRSA infection nor to guide or monitor treatment for MRSA infections. Test performance is not FDA approved in patients less than 47 years old. Performed at [facility], [address], [place], [place] [address]   Culture, blood (Routine X  2) w Reflex to ID Panel     Status: None   Collection Time: 04/07/23  7:00 PM   Specimen: BLOOD  Result Value Ref Range Status   Specimen Description BLOOD BLOOD RIGHT ARM  Final   Special Requests   Final    BOTTLES DRAWN AEROBIC AND ANAEROBIC Blood Culture adequate volume   Culture   Final    NO GROWTH 5 DAYS Performed at [facility], [address], [place], [place] [address]    Report Status 04/12/2023 FINAL  Final  Culture, blood (Routine X 2) w Reflex to ID Panel     Status: None   Collection Time: 04/07/23  7:06 PM   Specimen: BLOOD  Result Value Ref Range Status   Specimen Description BLOOD BLOOD RIGHT ARM  Final   Special Requests   Final    BOTTLES DRAWN AEROBIC AND ANAEROBIC Blood Culture results may not be optimal due to an excessive volume of blood received in culture bottles   Culture   Final    NO GROWTH 5 DAYS Performed at [facility], [address], [place], [place] [address]    Report Status 04/12/2023 FINAL  Final    Radiology Studies: No results found.  Scheduled Meds:  amLODipine  10 mg Oral Daily   chlordiazePOXIDE  10 mg Oral TID   Chlorhexidine Gluconate Cloth  6 each Topical  Q0600   folic acid  1 mg Oral Daily   multivitamin with minerals  1 tablet Oral Daily   nicotine  14 mg Transdermal Daily   mouth rinse  15 mL Mouth Rinse 4 times per day   pantoprazole  40 mg Oral Daily   tamsulosin  0.4 mg Oral Daily   thiamine  100 mg Oral Daily   Or   thiamine  100 mg Intravenous Daily   Continuous Infusions:  0.9 % NaCl with KCl 20 mEq / L 100 mL/hr at 04/12/23 1733   dexmedetomidine (PRECEDEX) IV infusion 0.6 mcg/kg/hr (04/12/23 1733)    LOS: 7 days   Shon Hale, MD How to contact the [facility] Attending or Consulting provider 7A - 7P or covering provider during after hours 7P -7A, for this patient?  Check the care team in [facility] and look for a) attending/consulting TRH provider listed and b) the [facility] team listed Log into [email or web address] and use [place]'s universal password to access. If you do not have the password, please contact the hospital operator. Locate the [facility] provider you are looking for under [facility] and page to a number that you can be directly reached. If you still have difficulty reaching the provider, please page the [facility] (Director on Call) for the Hospitalists listed on amion for assistance.  04/12/2023, 5:45 PM

## 2023-04-12 NOTE — Plan of Care (Signed)
  Problem: Education: Goal: Knowledge of General Education information will improve Description: Including pain rating scale, medication(s)/side effects and non-pharmacologic comfort measures 04/12/2023 2030 by Brooke Bonito, RN Outcome: Progressing 04/12/2023 2030 by Brooke Bonito, RN Outcome: Progressing   Problem: Health Behavior/Discharge Planning: Goal: Ability to manage health-related needs will improve 04/12/2023 2030 by Brooke Bonito, RN Outcome: Progressing 04/12/2023 2030 by Brooke Bonito, RN Outcome: Progressing   Problem: Clinical Measurements: Goal: Ability to maintain clinical measurements within normal limits will improve 04/12/2023 2030 by Brooke Bonito, RN Outcome: Progressing 04/12/2023 2030 by Brooke Bonito, RN Outcome: Progressing Goal: Will remain free from infection 04/12/2023 2030 by Brooke Bonito, RN Outcome: Progressing 04/12/2023 2030 by Brooke Bonito, RN Outcome: Progressing Goal: Diagnostic test results will improve Outcome: Progressing Goal: Respiratory complications will improve Outcome: Progressing Goal: Cardiovascular complication will be avoided Outcome: Progressing   Problem: Activity: Goal: Risk for activity intolerance will decrease Outcome: Progressing   Problem: Nutrition: Goal: Adequate nutrition will be maintained Outcome: Progressing   Problem: Coping: Goal: Level of anxiety will decrease Outcome: Progressing   Problem: Elimination: Goal: Will not experience complications related to bowel motility Outcome: Progressing Goal: Will not experience complications related to urinary retention Outcome: Progressing   Problem: Pain Managment: Goal: General experience of comfort will improve Outcome: Progressing   Problem: Safety: Goal: Ability to remain free from injury will improve Outcome: Progressing   Problem: Skin Integrity: Goal: Risk for impaired skin integrity will decrease Outcome: Progressing    Problem: Safety: Goal: Non-violent Restraint(s) Outcome: Progressing

## 2023-04-12 NOTE — TOC Initial Note (Addendum)
Transition of Care [facility]) - Initial/Assessment Note    Patient Details  Name: Heidi [name] MRN: [id number] Date of Birth: March 24, 1971  Transition of Care [facility]) CM/SW Contact:    Annice Needy, LCSW Phone Number: 04/12/2023, 1:20 PM  Clinical Narrative:                 [facility] consulted for SA resources. SA resources added to patient's discharge instructions. [place] remains confused. TOC will discuss SA resources once she is more clear.  Additionally, PT recommends HHPT. Patient's desire for services will need to be discussed when she is more clear. 3n1 and RW ordered via Barbara Cower at [employer].   Expected Discharge Plan: Home/Self Care Barriers to Discharge: Continued Medical Work up   Patient Goals and CMS Choice Patient states their goals for this hospitalization and ongoing recovery are:: go home          Expected Discharge Plan and Services In-house Referral: Clinical Social Work     Living arrangements for the past 2 months: Single Family Home                                      Prior Living Arrangements/Services Living arrangements for the past 2 months: Single Family Home Lives with:: Self Patient language and need for interpreter reviewed:: Yes Do you feel safe going back to the place where you live?: Yes      Need for Family Participation in Patient Care: No (Comment)     Criminal Activity/Legal Involvement Pertinent to Current Situation/Hospitalization: No - Comment as needed  Activities of Daily Living Home Assistive Devices/Equipment: None ADL Screening (condition at time of admission) Patient's cognitive ability adequate to safely complete daily activities?: Yes Is the patient deaf or have difficulty hearing?: No Does the patient have difficulty seeing, even when wearing glasses/contacts?: No Does the patient have difficulty concentrating, remembering, or making decisions?: No Patient able to express need for assistance with ADLs?: Yes Does the  patient have difficulty dressing or bathing?: No Independently performs ADLs?: Yes (appropriate for developmental age) Does the patient have difficulty walking or climbing stairs?: No Weakness of Legs: None Weakness of Arms/Hands: None  Permission Sought/Granted                  Emotional Assessment       Orientation: : Oriented to Self, Oriented to Place, Oriented to  Time, Oriented to Situation Alcohol / Substance Use: Alcohol Use Psych Involvement: No (comment)  Admission diagnosis:  Alcohol withdrawal [F10.939] Patient Active Problem List   Diagnosis Date Noted   Transaminitis 04/06/2023   GERD (gastroesophageal reflux disease) 04/06/2023   Tobacco use disorder 04/06/2023   Alcohol withdrawal 10/19/2022   HTN (hypertension) 10/19/2022   Vomiting 10/19/2022   Hypomagnesemia 10/19/2022   CERVICALGIA 01/28/2011   SHOULDER PAIN 10/29/2010   CERVICAL RADICULOPATHY 10/29/2010   PCP:  Tylene Fantasia., PA-C Pharmacy:   [facility] [place], [place] - [address] [place] [address] Phone: [phone] Fax: [phone]     Social Determinants of Health (SDOH) Social History: SDOH Screenings   Food Insecurity: Food Insecurity Present (10/19/2022)  Housing: Medium Risk (10/19/2022)  Transportation Needs: No Transportation Needs (10/19/2022)  Utilities: At Risk (10/19/2022)  Tobacco Use: High Risk (04/06/2023)   SDOH Interventions: Food Insecurity Interventions: Inpatient TOC Utilities Interventions: Inpatient TOC   Readmission Risk Interventions  No data to display

## 2023-04-12 NOTE — Plan of Care (Signed)
  Problem: Acute Rehab PT Goals(only PT should resolve) Goal: Pt Will Go Supine/Side To Sit Outcome: Progressing Flowsheets (Taken 04/12/2023 1553) Pt will go Supine/Side to Sit:  with modified independence  Independently Goal: Patient Will Transfer Sit To/From Stand Outcome: Progressing Flowsheets (Taken 04/12/2023 1553) Patient will transfer sit to/from stand: with modified independence Goal: Pt Will Transfer Bed To Chair/Chair To Bed Outcome: Progressing Flowsheets (Taken 04/12/2023 1553) Pt will Transfer Bed to Chair/Chair to Bed: with modified independence Goal: Pt Will Ambulate Outcome: Progressing Flowsheets (Taken 04/12/2023 1553) Pt will Ambulate:  > 125 feet  with modified independence  with least restrictive assistive device   3:54 PM, 04/12/23 [name], MPT Physical Therapist with [facility] [phone] office [phone] mobile phone

## 2023-04-12 NOTE — Progress Notes (Signed)
The beneficiary is confined to a single room and in need of 3n1 bedside commode.

## 2023-04-13 DIAGNOSIS — F10931 Alcohol use, unspecified with withdrawal delirium: Secondary | ICD-10-CM | POA: Diagnosis not present

## 2023-04-13 DIAGNOSIS — R7401 Elevation of levels of liver transaminase levels: Secondary | ICD-10-CM | POA: Diagnosis not present

## 2023-04-13 MED ORDER — QUETIAPINE FUMARATE 25 MG PO TABS
50.0000 mg | ORAL_TABLET | Freq: Every day | ORAL | Status: DC
  Administered 2023-04-13 – 2023-04-14 (×2): 50 mg via ORAL
  Filled 2023-04-13 (×2): qty 2

## 2023-04-13 MED ORDER — QUETIAPINE FUMARATE 25 MG PO TABS
25.0000 mg | ORAL_TABLET | Freq: Every morning | ORAL | Status: DC
  Administered 2023-04-13 – 2023-04-15 (×3): 25 mg via ORAL
  Filled 2023-04-13 (×3): qty 1

## 2023-04-13 NOTE — Progress Notes (Addendum)
PROGRESS NOTE   Heidi [name]  ZOX:[id number] DOB: 1971/08/05 DOA: 04/05/2023 PCP: Tylene Fantasia., PA-C   Chief Complaint  Patient presents with   Seizures   Level of care: ICU  Brief Admission History:  52 year old female alcoholic apparently had a questionable seizure at a friend's house on 04/05/2023.  Patient was confused and combative with EMS and apparently had bit her tongue.  She had been out of alcohol for nonspecified time.  She apparently was feeling shaky when she walked over to the friend's house.  She was feeling very tremulous as well.  She has a hit heavy history of fall consumption.  She also has a history of alcohol withdrawal seizure in the past.  No other injuries associated with current presentation.  Trauma and metabolic workup was unremarkable and she was admitted to the hospital for complications of acute alcohol withdrawal.   Assessment and Plan:  Acute Alcohol withdrawal/Alcohol withdrawal seizure - Patient drinks 6-12 beers per day  - Last drink was approximately 36 hours prior to arrival - Prior to admission patient had witnessed seizure activity and loss of consciousness reported by friend and EMS reports -Patient reports prior history of alcohol withdrawal seizure 04/13/23 -DT symptoms were improving so Precedex was DC'd -Patient started to have confusion and hallucinations and was getting agitated again -She is back on IV Precedex -patient is now able to answer simple questions -Continue needed benzos per CIWA protocol  - Tobacco use disorder - Nicotine patch ordered   GERD (gastroesophageal reflux disease) - Continue PPI  Thrombocytopenia- due to a combination of 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 and etoh hepatitis- No Bleeding concerns  Platelets are improving Monitor closely   Transaminitis -More consistent with alcoholic hepatitis - Right upper quadrant ultrasound with fatty liver otherwise no acute findings -LFTs trending  down    Latest Ref Rng & Units 04/12/2023    5:29 AM 04/10/2023    8:27 AM 04/09/2023    4:11 AM  Hepatic Function  Total Protein 6.5 - 8.1 g/dL 6.5  7.0  6.6   Albumin 3.5 - 5.0 g/dL 3.3  3.5  3.5   AST 15 - 41 U/L 34  48  57   ALT 0 - 44 U/L 26  36  41   Alk Phosphatase 38 - 126 U/L 62  75  68   Total Bilirubin 0.3 - 1.2 mg/dL 0.5  0.9  0.7      Acute urinary retention--required in and out catheterization, with subsequent placement of indwelling Foley due to persistent urinary retention -Foley removed on 04/12/2023--- voiding spontaneously after Foley removal  Generalized weakness and deconditioning--- physical therapy eval appreciated recommends home health PT and equipment  CRITICAL CARE Performed by: Shon Hale  Total critical care time: 51 minutes  Critical care time was exclusive of separately billable procedures and treating other patients.  Confusional episodes, disorientation and agitation from time to time -Requiring IV Precedex drip and IV benzos  Critical care was necessary to treat or prevent imminent or life-threatening deterioration.  Critical care was time spent personally by me on the following activities: development of treatment plan with patient and/or surrogate as well as nursing, discussions with consultants, evaluation of patient's response to treatment, examination of patient, obtaining history from patient or surrogate, ordering and performing treatments and interventions, ordering and review of laboratory studies, ordering and review of radiographic studies, pulse oximetry and re-evaluation of patient's condition.    DVT prophylaxis: scd/low platelets  Code Status: Full  Family Communication:  I called and updated her mother   Disposition: Home with home health services after resolution of DTs  Status is: Inpatient Remains inpatient appropriate because: intensity      Subjective: - 04/13/23 -DT symptoms were improving so Precedex was  DC'd -Patient started to have confusion and hallucinations and was getting agitated again -She is back on IV Precedex -patient is now able to answer simple questions -Continue needed benzos per CIWA protocol   Objective: Vitals:   04/13/23 0900 04/13/23 1000 04/13/23 1100 04/13/23 1200  BP: 108/82  132/88 (!) 136/114  Pulse: 80 72 80 96  Resp: 18 18 20 20   Temp:    98 F (36.7 C)  TempSrc:    Oral  SpO2: 100% 100% 100% 98%  Weight:      Height:        Intake/Output Summary (Last 24 hours) at 04/13/2023 1407 Last data filed at 04/13/2023 1235 Gross per 24 hour  Intake 3442.85 ml  Output 4825 ml  Net -1382.15 ml   Filed Weights   04/10/23 0438 04/11/23 0417 04/12/23 0500  Weight: 64.9 kg 63.1 kg 63.1 kg   Physical Exam  Gen:-Confusional episodes, disorientation and agitation from time to time HEENT:- [place].AT, No sclera icterus Neck-Supple Neck,No JVD,.  Lungs-  CTAB , fair air movement bilaterally  CV- S1, S2 normal, RRR Abd-  +ve B.Sounds, Abd Soft, No tenderness,    Extremity/Skin:- No  edema,   good pedal pulses  Psych-Confusional episodes, disorientation and agitation from time to time neuro-- no new focal deficits, improving +ve tremors  Data Reviewed: I have personally reviewed following labs and imaging studies  CBC: Recent Labs  Lab 04/07/23 0509 04/10/23 0503 04/12/23 0529  WBC 3.7* 6.3 5.5  HGB 12.5 12.3 11.3*  HCT 37.8 36.6 34.3*  MCV 94.3 93.6 94.0  PLT 58* 96* 141*    Basic Metabolic Panel: Recent Labs  Lab 04/07/23 0509 04/08/23 0431 04/09/23 0411 04/10/23 0827 04/12/23 0529  NA 141 142 141 137 137  K 3.8 3.6 3.1* 3.5 3.4*  CL 112* 115* 112* 107 109  CO2 20* 19* 19* 17* 21*  GLUCOSE 118* 107* 90 95 114*  BUN 5* 5* <5* 6 5*  CREATININE 0.50 0.48 0.43* 0.57 0.60  CALCIUM 9.2 9.7 9.8 9.4 9.2  MG 2.3  --   --   --   --     CBG: Recent Labs  Lab 04/07/23 1857 04/09/23 1051  GLUCAP 126* 101*    Recent Results (from the past 240  hour(s))  MRSA Next Gen by PCR, Nasal     Status: None   Collection Time: 04/05/23 10:46 PM   Specimen: Nasal Mucosa; Nasal Swab  Result Value Ref Range Status   MRSA by PCR Next Gen NOT DETECTED NOT DETECTED Final    Comment: (NOTE) The GeneXpert MRSA Assay (FDA approved for NASAL specimens only), is one component of a comprehensive MRSA colonization surveillance program. It is not intended to diagnose MRSA infection nor to guide or monitor treatment for MRSA infections. Test performance is not FDA approved in patients less than 52 years old. Performed at [facility], [address] [place], [place] [address]   Culture, blood (Routine X 2) w Reflex to ID Panel     Status: None   Collection Time: 04/07/23  7:00 PM   Specimen: BLOOD  Result Value Ref Range Status   Specimen Description BLOOD BLOOD RIGHT ARM  Final   Special Requests   Final    BOTTLES DRAWN AEROBIC AND ANAEROBIC Blood Culture adequate volume   Culture   Final    NO GROWTH 5 DAYS Performed at [facility], [address], [place], [place] [address]    Report Status 04/12/2023 FINAL  Final  Culture, blood (Routine X 2) w Reflex to ID Panel     Status: None   Collection Time: 04/07/23  7:06 PM   Specimen: BLOOD  Result Value Ref Range Status   Specimen Description BLOOD BLOOD RIGHT ARM  Final   Special Requests   Final    BOTTLES DRAWN AEROBIC AND ANAEROBIC Blood Culture results may not be optimal due to an excessive volume of blood received in culture bottles   Culture   Final    NO GROWTH 5 DAYS Performed at [facility], [address] [place], [place] [address]    Report Status 04/12/2023 FINAL  Final    Radiology Studies: No results found.  Scheduled Meds:  amLODipine  10 mg Oral Daily   chlordiazePOXIDE  10 mg Oral TID   Chlorhexidine Gluconate Cloth  6 each Topical Q0600   folic acid  1 mg Oral Daily   multivitamin with minerals  1 tablet Oral Daily   nicotine  14 mg Transdermal Daily   mouth rinse   15 mL Mouth Rinse 4 times per day   pantoprazole  40 mg Oral Daily   tamsulosin  0.4 mg Oral Daily   thiamine  100 mg Oral Daily   Or   thiamine  100 mg Intravenous Daily   Continuous Infusions:  0.9 % NaCl with KCl 20 mEq / L Stopped (04/13/23 0300)   dexmedetomidine (PRECEDEX) IV infusion 0.4 mcg/kg/hr (04/13/23 1349)    LOS: 8 days   Shon Hale, MD How to contact the [facility] Attending or Consulting provider 7A - 7P or covering provider during after hours 7P -7A, for this patient?  Check the care team in [facility] and look for a) attending/consulting TRH provider listed and b) the [facility] team listed Log into [email or web address] and use [place]'s universal password to access. If you do not have the password, please contact the hospital operator. Locate the [facility] provider you are looking for under [facility] and page to a number that you can be directly reached. If you still have difficulty reaching the provider, please page the [facility] (Director on Call) for the Hospitalists listed on amion for assistance.  04/13/2023, 2:07 PM

## 2023-04-13 NOTE — Plan of Care (Signed)

## 2023-04-14 DIAGNOSIS — K219 Gastro-esophageal reflux disease without esophagitis: Secondary | ICD-10-CM | POA: Diagnosis not present

## 2023-04-14 DIAGNOSIS — R7401 Elevation of levels of liver transaminase levels: Secondary | ICD-10-CM | POA: Diagnosis not present

## 2023-04-14 DIAGNOSIS — F17200 Nicotine dependence, unspecified, uncomplicated: Secondary | ICD-10-CM | POA: Diagnosis not present

## 2023-04-14 DIAGNOSIS — F10939 Alcohol use, unspecified with withdrawal, unspecified: Secondary | ICD-10-CM | POA: Diagnosis not present

## 2023-04-14 LAB — RENAL FUNCTION PANEL
Albumin: 3.2 g/dL — ABNORMAL LOW (ref 3.5–5.0)
Anion gap: 7 (ref 5–15)
BUN: 7 mg/dL (ref 6–20)
CO2: 24 mmol/L (ref 22–32)
Calcium: 9.7 mg/dL (ref 8.9–10.3)
Chloride: 106 mmol/L (ref 98–111)
Creatinine, Ser: 0.57 mg/dL (ref 0.44–1.00)
GFR, Estimated: >60 mL/min (ref >60)
Glucose, Bld: 103 mg/dL — ABNORMAL HIGH (ref 70–99)
Phosphorus: 3.7 mg/dL (ref 2.5–4.6)
Potassium: 3.2 mmol/L — ABNORMAL LOW (ref 3.5–5.1)
Sodium: 137 mmol/L (ref 135–145)

## 2023-04-14 LAB — CBC
HCT: 32.4 % — ABNORMAL LOW (ref 36.0–46.0)
Hemoglobin: 11 g/dL — ABNORMAL LOW (ref 12.0–15.0)
MCH: 31.3 pg (ref 26.0–34.0)
MCHC: 34 g/dL (ref 30.0–36.0)
MCV: 92 fL (ref 80.0–100.0)
Platelets: 202 10*3/uL (ref 150–400)
RBC: 3.52 MIL/uL — ABNORMAL LOW (ref 3.87–5.11)
RDW: 13.8 % (ref 11.5–15.5)
WBC: 5.3 10*3/uL (ref 4.0–10.5)
nRBC: 0 % (ref 0.0–0.2)

## 2023-04-14 LAB — VITAMIN B12: Vitamin B-12: 565 pg/mL (ref 180–914)

## 2023-04-14 MED ORDER — POTASSIUM CHLORIDE CRYS ER 20 MEQ PO TBCR
40.0000 meq | EXTENDED_RELEASE_TABLET | ORAL | Status: AC
  Administered 2023-04-14 (×2): 40 meq via ORAL
  Filled 2023-04-14 (×2): qty 2

## 2023-04-14 MED ORDER — DILTIAZEM HCL 30 MG PO TABS
30.0000 mg | ORAL_TABLET | Freq: Four times a day (QID) | ORAL | Status: DC
  Administered 2023-04-14 – 2023-04-15 (×4): 30 mg via ORAL
  Filled 2023-04-14 (×4): qty 1

## 2023-04-14 MED ORDER — MAGNESIUM SULFATE IN D5W 1-5 GM/100ML-% IV SOLN
1.0000 g | Freq: Once | INTRAVENOUS | Status: AC
  Administered 2023-04-14: 1 g via INTRAVENOUS
  Filled 2023-04-14: qty 100

## 2023-04-14 NOTE — TOC Progression Note (Signed)
Transition of Care [facility]) - Progression Note    Patient Details  Name: Heidi [name] MRN: [id number] Date of Birth: 12-Apr-1971  Transition of Care [facility]) CM/SW Contact  Annice Needy, LCSW Phone Number: 04/14/2023, 11:58 AM  Clinical Narrative:    Patient lives with her mother who works out of town and her brother who is "in and out." At baseline she is independent. Patient is agreeable to HHPT or OPPT if [facility] companies cannot accept due to insurance. Patient states that her address is [address] [place] and an alternate phone is [phone]. She is agreeable to SA resources and advised that they are on her d/c instructions. Patient agreeable to DME. Barbara Cower with [employer] advised of patient's correct address.   Expected Discharge Plan: Home/Self Care Barriers to Discharge: Continued Medical Work up  Expected Discharge Plan and Services In-house Referral: Clinical Social Work     Living arrangements for the past 2 months: Single Family Home                                       Social Determinants of Health (SDOH) Interventions SDOH Screenings   Food Insecurity: No Food Insecurity (04/13/2023)  Housing: Low Risk  (04/13/2023)  Transportation Needs: No Transportation Needs (04/13/2023)  Utilities: Not At Risk (04/13/2023)  Tobacco Use: High Risk (04/06/2023)    Readmission Risk Interventions     No data to display

## 2023-04-14 NOTE — Progress Notes (Signed)
Transferred to room from ICU.  Alert and oriented.  Vitals stable except becomes tachycardic with activity.  Walked in hallway with therapy.  Asking for food and drink.  No signs of alcohol withdrawal.

## 2023-04-14 NOTE — Progress Notes (Signed)
PROGRESS NOTE   Heidi [name]  XBJ:[id number] DOB: 07-03-1971 DOA: 04/05/2023 PCP: Tylene Fantasia., PA-C   Chief Complaint  Patient presents with   Seizures   Level of care: Telemetry  Brief Admission History:  52 year old female alcoholic apparently had a questionable seizure at a friend's house on 04/05/2023.  Patient was confused and combative with EMS and apparently had bit her tongue.  She had been out of alcohol for nonspecified time.  She apparently was feeling shaky when she walked over to the friend's house.  She was feeling very tremulous as well.  She has a hit heavy history of fall consumption.  She also has a history of alcohol withdrawal seizure in the past.  No other injuries associated with current presentation.  Trauma and metabolic workup was unremarkable and she was admitted to the hospital for complications of acute alcohol withdrawal.   Assessment and Plan:  Acute Alcohol withdrawal/Alcohol withdrawal seizure - Patient drinks 6-12 beers per day  - Last drink was approximately 36 hours prior to arrival - No seizure events appreciated -Continue CIWA protocol and the use of Librium; patient off Precedex and so far demonstrating no significant withdrawal symptoms. -Cessation counseling and complete abstinence has been encouraged. -Continue thiamine, folic acid and check B12.  Tobacco use disorder - Cessation counseling provided -Continue nicotine patch.   GERD (gastroesophageal reflux disease) - Continue PPI  Thrombocytopenia- due to a combination of 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 and etoh hepatitis- No Bleeding concerns  Platelets are improving antistaphylolysin; no overt bleeding appreciated.   Transaminitis -More consistent with alcoholic hepatitis - Right upper quadrant ultrasound with fatty liver otherwise no acute findings -LFTs trending down; most likely associated with the use of alcohol. -Cessation counseling provided.    Latest Ref  Rng & Units 04/14/2023    5:07 AM 04/12/2023    5:29 AM 04/10/2023    8:27 AM  Hepatic Function  Total Protein 6.5 - 8.1 g/dL  6.5  7.0   Albumin 3.5 - 5.0 g/dL 3.2  3.3  3.5   AST 15 - 41 U/L  34  48   ALT 0 - 44 U/L  26  36   Alk Phosphatase 38 - 126 U/L  62  75   Total Bilirubin 0.3 - 1.2 mg/dL  0.5  0.9     Acute urinary retention--required in and out catheterization, with subsequent placement of indwelling Foley due to persistent urinary retention -Foley removed on 04/12/2023--patient has continued voiding spontaneously without any problem. -Continue to maintain adequate hydration.  Generalized weakness and deconditioning--- physical therapy eval appreciated recommends home health PT at time of discharge. -Continue supportive care, electrolyte repletion and adequate hydration.    DVT prophylaxis: scd/low platelets Code Status: Full  Family Communication:  I called and updated her mother   Disposition: Home with home health services after resolution of DTs  Status is: Inpatient Remains inpatient appropriate because: intensity      Subjective: No further seizure activity appreciated; denying chest pain, nausea, vomiting and fever.  Still having mild tremors in her hands but feeling significantly better and tolerating diet without problem.  Patient reporting some tingling sensation in her feet.  Objective: Vitals:   04/14/23 1315 04/14/23 1337 04/14/23 1352 04/14/23 1503  BP:  108/69  120/76  Pulse: (!) 123 (!) 117 (!) 109 (!) 110  Resp: Temp:    98.5 F (36.9 C)  TempSrc:    Oral  SpO2: 100% 98% 99% 100%  Weight:      Height:        Intake/Output Summary (Last 24 hours) at 04/14/2023 1827 Last data filed at 04/14/2023 1700 Gross per 24 hour  Intake 260 ml  Output --  Net 260 ml   Filed Weights   04/11/23 0417 04/12/23 0500 04/14/23 0509  Weight: 63.1 kg 63.1 kg 61.9 kg   Physical Exam General exam: Alert, awake, oriented x 3; following commands  appropriately and reporting no nausea vomiting.  Slight shakiness/tremors appreciated without significant withdrawal symptoms. Respiratory system: Clear to auscultation. Respiratory effort normal.  Good saturation on room air. Cardiovascular system: Sinus tachycardia, no rubs, no gallops, no JVD. Gastrointestinal system: Abdomen is nondistended, soft and nontender. No organomegaly or masses felt. Normal bowel sounds heard. Central nervous system: Alert and oriented. No focal neurological deficits. Extremities: No cyanosis, clubbing or edema. Skin: No petechiae. Psychiatry: Judgement and insight appear normal. Mood & affect appropriate.   Data Reviewed: I have personally reviewed following labs and imaging studies  CBC: Recent Labs  Lab 04/10/23 0503 04/12/23 0529 04/14/23 0507  WBC 6.3 5.5 5.3  HGB 12.3 11.3* 11.0*  HCT 36.6 34.3* 32.4*  MCV 93.6 94.0 92.0  PLT 96* 141* 202    Basic Metabolic Panel: Recent Labs  Lab 04/08/23 0431 04/09/23 0411 04/10/23 0827 04/12/23 0529 04/14/23 0507  NA 142 141 137 137 137  K 3.6 3.1* 3.5 3.4* 3.2*  CL 115* 112* 107 109 106  CO2 19* 19* 17* 21* 24  GLUCOSE 107* 90 95 114* 103*  BUN 5* <5* 6 5* 7  CREATININE 0.48 0.43* 0.57 0.60 0.57  CALCIUM 9.7 9.8 9.4 9.2 9.7  PHOS  --   --   --   --  3.7    CBG: Recent Labs  Lab 04/07/23 1857 04/09/23 1051  GLUCAP 126* 101*    Recent Results (from the past 240 hour(s))  MRSA Next Gen by PCR, Nasal     Status: None   Collection Time: 04/05/23 10:46 PM   Specimen: Nasal Mucosa; Nasal Swab  Result Value Ref Range Status   MRSA by PCR Next Gen NOT DETECTED NOT DETECTED Final    Comment: (NOTE) The GeneXpert MRSA Assay (FDA approved for NASAL specimens only), is one component of a comprehensive MRSA colonization surveillance program. It is not intended to diagnose MRSA infection nor to guide or monitor treatment for MRSA infections. Test performance is not FDA approved in patients less  than 32 years old. Performed at [facility], [address], [place], [place] [address]   Culture, blood (Routine X 2) w Reflex to ID Panel     Status: None   Collection Time: 04/07/23  7:00 PM   Specimen: BLOOD  Result Value Ref Range Status   Specimen Description BLOOD BLOOD RIGHT ARM  Final   Special Requests   Final    BOTTLES DRAWN AEROBIC AND ANAEROBIC Blood Culture adequate volume   Culture   Final    NO GROWTH 5 DAYS Performed at [facility], [address], [place], [place] [address]    Report Status 04/12/2023 FINAL  Final  Culture, blood (Routine X 2) w Reflex to ID Panel     Status: None   Collection Time: 04/07/23  7:06 PM   Specimen: BLOOD  Result Value Ref Range Status   Specimen Description BLOOD BLOOD RIGHT ARM  Final   Special Requests   Final    BOTTLES  DRAWN AEROBIC AND ANAEROBIC Blood Culture results may not be optimal due to an excessive volume of blood received in culture bottles   Culture   Final    NO GROWTH 5 DAYS Performed at [facility], [address], [place], [place] [address]    Report Status 04/12/2023 FINAL  Final    Radiology Studies: No results found.  Scheduled Meds:  chlordiazePOXIDE  10 mg Oral TID   Chlorhexidine Gluconate Cloth  6 each Topical Q0600   diltiazem  30 mg Oral Q6H   folic acid  1 mg Oral Daily   multivitamin with minerals  1 tablet Oral Daily   nicotine  14 mg Transdermal Daily   pantoprazole  40 mg Oral Daily   QUEtiapine  25 mg Oral q AM   QUEtiapine  50 mg Oral QHS   tamsulosin  0.4 mg Oral Daily   thiamine  100 mg Oral Daily   Or   thiamine  100 mg Intravenous Daily   Continuous Infusions:    LOS: 9 days   Vassie Loll, MD How to contact the [facility] Attending or Consulting provider 7A - 7P or covering provider during after hours 7P -7A, for this patient?  Check the care team in [facility] and look for a) attending/consulting TRH provider listed and b) the [facility] team listed Log into [email or web address] and use [place]'s  universal password to access. If you do not have the password, please contact the hospital operator. Locate the [facility] provider you are looking for under [facility] and page to a number that you can be directly reached. If you still have difficulty reaching the provider, please page the [facility] (Director on Call) for the Hospitalists listed on amion for assistance.  04/14/2023,

## 2023-04-14 NOTE — Progress Notes (Signed)
Physical Therapy Treatment Patient Details Name: Heidi [name] MRN: [id number] DOB: 05-24-1971 Today's Date: 04/14/2023   History of Present Illness Heidi [name] is a 52 y.o. female with medical history significant of hypertension, alcohol use disorder, tobacco use disorder, and more presents the ED chief complaint of seizure.  Patient reports that she members getting up and walking around the house, then she fell.  She is very poor historian, constantly falling asleep outlined the history, mumbling, and quite honestly I do not think she remembers much of it.  She reports she did lose consciousness.  She is not sure if she hit her head.  She reports somebody told her she is having a seizure.  She reports she quit drinking in the past for 8 months and did not have seizures at that time.  She was not able to answer if she noticed herself being tremulous before that.  She has been nauseous, having palpitation, having chest pain etc.  Patient just kept falling asleep before answering his questions.  Eventually she started falling asleep before the question was fully repeated.  This is likely due to her Ativan, which is necessary for her withdrawal symptoms.  Unfortunately, it means I did not get much of a history.    PT Comments    Pt sitting edge of bed, eager for assistance to bathroom.  Pt initially impulsive due to urge to urinate, however after completing task was less impulsive with decreased coordination noted.  Pt with near fall when getting to toilet, however able to self correct self and safely descend to toilet.  PT independent with self hygiene.  Ambulated further at 175 feet with minimal fatigue noted, however unsteady without RW and noted reduced coordination in movements, taking increased time and thought.  Pt returned to bed per request and able to position interdependently.    Recommendations for follow up therapy are one component of a multi-disciplinary discharge planning process, led  by the attending physician.  Recommendations may be updated based on patient status, additional functional criteria and insurance authorization.     Assistance Recommended at Discharge Intermittent Supervision/Assistance  Patient can return home with the following A little help with walking and/or transfers;A little help with bathing/dressing/bathroom;Help with stairs or ramp for entrance;Assistance with cooking/housework   Equipment Recommendations  Rolling walker (2 wheels);BSC/3in1       Precautions / Restrictions Precautions Precautions: Fall     Mobility  Bed Mobility Overal bed mobility: Needs Assistance Bed Mobility: Supine to Sit, Sit to Supine     Supine to sit: Supervision Sit to supine: Supervision   General bed mobility comments: slow, poor coordination    Transfers Overall transfer level: Needs assistance Equipment used: Rolling walker (2 wheels), 1 person hand held assist Transfers: Sit to/from Stand, Bed to chair/wheelchair/BSC Sit to Stand: Min guard, Min assist   Step pivot transfers: Min guard, Min assist       General transfer comment: poor coordination.  impulsive; unsteady on feet with near fall without AD, required use of RW for safety    Ambulation/Gait Ambulation/Gait assistance: Min guard, Min assist Gait Distance (Feet): 175 Feet Assistive device: Rolling walker (2 wheels) Gait Pattern/deviations: Decreased step length - left, Decreased stance time - right, Decreased stride length Gait velocity: decreased Gait velocity interpretation: <1.8 ft/sec, indicate of risk for recurrent falls   General Gait Details: unsteady labored cadence without loss of balance using RW, limited due to fatigue  Cognition Arousal/Alertness: Awake/alert Behavior During Therapy: Restless Overall Cognitive Status: Within Functional Limits for tasks assessed                                  General Comments: pt was eager to get out of bed and use restroom               Pertinent Vitals/Pain Pain Assessment Pain Assessment: No/denies pain Facial Expression: Tense    Home Living                           PT Goals (current goals can now be found in the care plan section) Acute Rehab PT Goals Patient Stated Goal: return home with family, friends to assist PT Goal Formulation: With patient Time For Goal Achievement: 04/19/23 Potential to Achieve Goals: Good    Frequency    Min 3X/week       AM-PAC PT "6 Clicks" Mobility   Outcome Measure  Help needed turning from your back to your side while in a flat bed without using bedrails?: None Help needed moving from lying on your back to sitting on the side of a flat bed without using bedrails?: A Little Help needed moving to and from a bed to a chair (including a wheelchair)?: A Little Help needed standing up from a chair using your arms (e.g., wheelchair or bedside chair)?: A Little Help needed to walk in hospital room?: A Little Help needed climbing 3-5 steps with a railing? : A Lot 6 Click Score: 18    End of Session   Activity Tolerance: Patient tolerated treatment well;Patient limited by fatigue Patient left: in bed;with call bell/phone within reach Nurse Communication: Mobility status PT Visit Diagnosis: Unsteadiness on feet (R26.81);Other abnormalities of gait and mobility (R26.89);Muscle weakness (generalized) (M62.81)     Time: 1610-9604 PT Time Calculation (min) (ACUTE ONLY): 25 min  Charges:  $Gait Training: 8-22 mins $Therapeutic Activity: 8-22 mins                    Lurena Nida, PTA/CLT [facility] Ph: [phone]    Lurena Nida 04/14/2023, 4:23 PM

## 2023-04-14 NOTE — TOC Progression Note (Signed)
Transition of Care [facility]) - Progression Note    Patient Details  Name: Heidi [name] MRN: [id number] Date of Birth: [birth year]  Transition of Care [facility] CM/SW Contact  Annice Needy, LCSW Phone Number: 04/14/2023, 4:04 PM  Clinical Narrative:    Heidi [name] with [facility] confirms acceptance of PT/OT. Advised of updated address in chart note. DME is in patient's room.    Expected Discharge Plan: Home/Self Care Barriers to Discharge: Continued Medical Work up  Expected Discharge Plan and Services In-house Referral: Clinical Social Work     Living arrangements for the past 2 months: Single Family Home                           HH Arranged: PT, OT HH Agency: [facility] Date HH Agency Contacted: 04/14/23 Time HH Agency Contacted: 1604 Representative spoke with at [facility] Agency: Heidi [name]   Social Determinants of Health (SDOH) Interventions SDOH Screenings   Food Insecurity: No Food Insecurity (04/13/2023)  Housing: Low Risk  (04/13/2023)  Transportation Needs: No Transportation Needs (04/13/2023)  Utilities: Not At Risk (04/13/2023)  Tobacco Use: High Risk (04/06/2023)    Readmission Risk Interventions     No data to display

## 2023-04-15 DIAGNOSIS — R7401 Elevation of levels of liver transaminase levels: Secondary | ICD-10-CM | POA: Diagnosis not present

## 2023-04-15 DIAGNOSIS — F17200 Nicotine dependence, unspecified, uncomplicated: Secondary | ICD-10-CM | POA: Diagnosis not present

## 2023-04-15 DIAGNOSIS — F10931 Alcohol use, unspecified with withdrawal delirium: Secondary | ICD-10-CM | POA: Diagnosis not present

## 2023-04-15 DIAGNOSIS — K219 Gastro-esophageal reflux disease without esophagitis: Secondary | ICD-10-CM | POA: Diagnosis not present

## 2023-04-15 LAB — BASIC METABOLIC PANEL WITH GFR
Anion gap: 7 (ref 5–15)
BUN: 5 mg/dL — ABNORMAL LOW (ref 6–20)
CO2: 21 mmol/L — ABNORMAL LOW (ref 22–32)
Calcium: 9.8 mg/dL (ref 8.9–10.3)
Chloride: 109 mmol/L (ref 98–111)
Creatinine, Ser: 0.52 mg/dL (ref 0.44–1.00)
GFR, Estimated: >60 mL/min
Glucose, Bld: 109 mg/dL — ABNORMAL HIGH (ref 70–99)
Potassium: 3.6 mmol/L (ref 3.5–5.1)
Sodium: 137 mmol/L (ref 135–145)

## 2023-04-15 LAB — MAGNESIUM: Magnesium: 1.7 mg/dL (ref 1.7–2.4)

## 2023-04-15 MED ORDER — OXYCODONE HCL 5 MG PO TABS: 5.0000 mg | ORAL_TABLET | Freq: Four times a day (QID) | ORAL | Status: DC | PRN

## 2023-04-15 MED ORDER — DILTIAZEM HCL ER COATED BEADS 120 MG PO CP24
120.0000 mg | ORAL_CAPSULE | Freq: Every day | ORAL | Status: DC
  Administered 2023-04-15: 120 mg via ORAL
  Filled 2023-04-15: qty 1

## 2023-04-15 MED ORDER — DILTIAZEM HCL ER COATED BEADS 120 MG PO CP24: 120.0000 mg | ORAL_CAPSULE | Freq: Every day | ORAL | 1 refills | Status: DC

## 2023-04-15 MED ORDER — QUETIAPINE FUMARATE 25 MG PO TABS: 25.0000 mg | ORAL_TABLET | Freq: Every day | ORAL | 1 refills | Status: DC

## 2023-04-15 MED ORDER — NICOTINE 14 MG/24HR TD PT24: 14.0000 mg | MEDICATED_PATCH | Freq: Every day | TRANSDERMAL | 0 refills | Status: DC

## 2023-04-15 MED ORDER — VITAMIN B-12 1000 MCG PO TABS: 1000.0000 ug | ORAL_TABLET | Freq: Every day | ORAL | 1 refills | Status: DC

## 2023-04-15 MED ORDER — PANTOPRAZOLE SODIUM 40 MG PO TBEC: 40.0000 mg | DELAYED_RELEASE_TABLET | Freq: Every day | ORAL | 1 refills | Status: DC

## 2023-04-15 MED ORDER — CHLORDIAZEPOXIDE HCL 5 MG PO CAPS: ORAL_CAPSULE | ORAL | 0 refills | Status: DC

## 2023-04-15 NOTE — Discharge Summary (Signed)
Physician Discharge Summary   Patient: Heidi [name] MRN: [id number] DOB: Aug 09, 1971  Admit date:     04/05/2023  Discharge date: 04/15/23  Discharge Physician: Vassie Loll   PCP: Kizzie Furnish D., PA-C   Recommendations at discharge:  Repeat complete metabolic panel to follow electrolytes, LFTs and renal function. Reassess blood pressure and adjust antihypertensive regimen as needed Continue assisting patient with alcohol and tobacco cessation.   Discharge Diagnoses: Principal Problem:   Alcohol withdrawal Active Problems:   Transaminitis   GERD (gastroesophageal reflux disease)   Tobacco use disorder  Hospital Course: 52 year old female alcoholic apparently had a questionable seizure at a friend's house on 04/05/2023.  Patient was confused and combative with EMS and apparently had bit her tongue.  She had been out of alcohol for nonspecified time.  She apparently was feeling shaky when she walked over to the friend's house.  She was feeling very tremulous as well.  She has a hit heavy history of fall consumption.  She also has a history of alcohol withdrawal seizure in the past.  No other injuries associated with current presentation.  Trauma and metabolic workup was unremarkable and she was admitted to the hospital for complications of acute alcohol withdrawal.  Assessment and Plan: Acute Alcohol withdrawal/Alcohol withdrawal seizure - Patient drinks 6-12 beers per day  - Last drink was approximately 36 hours prior to arrival - No further seizure events appreciated -Patient required the use of Precedex and CIWA protocol while hospitalized; successfully weaned off Precedex and discharged home on Librium tapering. -Cessation counseling provided -Thiamine, folic acid and B12 supplementation prescribed at discharge. -Continue assisting patient to maintain for alcohol abstinence.   Tobacco use disorder - Cessation counseling provided -Continue nicotine patch.    Hypokalemia -Electrolytes repleted and stable at discharge -Most likely associated with decreased oral intake and the use of alcohol -Cessation counseling provided. -Repeat complete metabolic panel follow-up visit to assess electrolytes and further replete as needed.    GERD (gastroesophageal reflux disease) - Continue PPI   Thrombocytopenia- due to a combination of 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 and etoh hepatitis- No Bleeding concerns  Platelets are improving antistaphylolysin; no overt bleeding appreciated.   Transaminitis -More consistent with alcoholic hepatitis - Right upper quadrant ultrasound with fatty liver otherwise no acute findings. -LFTs trending down; most likely associated with the use of alcohol. -Repeat LFTs later, follow BC. -Cessation counseling and complete abstinence advised provided.  Acute urinary retention--required in and out catheterization, with subsequent placement of indwelling Foley due to persistent urinary retention. -Patient was placed transiently on Flomax and eventually able to void on her own without catheter replaced. -No dysuria, increased frequency or hematuria reported. -Patient advised to maintain adequate hydration.   Generalized weakness and deconditioning--- physical therapy eval appreciated recommends home health PT/OT at time of discharge.  Hypertension/tachycardia -Improving/resolved after initiation of Cardizem -Continue to reassess blood pressure and further adjust antihypertensive regimen as needed Patient advised to maintain adequate hydration, follow low-sodium diet and stop alcohol consumption.  Depression/insomnia -Patient has been started on low-dose Seroquel nightly -Outpatient follow-up with psychiatry service recommended -Continue current medications. -No suicide ideation hallucinations at discharge appreciated.  Consultants: None Procedures performed: See below for x-ray reports. Disposition: Home with  home health services. Diet recommendation: Heart healthy diet.  DISCHARGE MEDICATION: Allergies as of 04/15/2023   No Known Allergies      Medication List     STOP taking these medications    omeprazole 20 MG  tablet Commonly known as: PriLOSEC OTC   ondansetron 4 MG tablet Commonly known as: Zofran       TAKE these medications    chlordiazePOXIDE 5 MG capsule Commonly known as: LIBRIUM Take 2 tablet by mouth 3 times a day x 2 days; then 1 tablet by mouth 3 times a day x 2 days; then 1 tablet by mouth twice a day x 2 days; then 1 tablet by mouth daily x 3 days and stop Librium.   cyanocobalamin 1000 MCG tablet Commonly known as: VITAMIN B12 Take 1 tablet (1,000 mcg total) by mouth daily.   diltiazem 120 MG 24 hr capsule Commonly known as: CARDIZEM CD Take 1 capsule (120 mg total) by mouth daily. Start taking on: April 16, 2023   folic acid 1 MG tablet Commonly known as: FOLVITE Take 1 tablet (1 mg total) by mouth daily.   multivitamin with minerals Tabs tablet Take 1 tablet by mouth daily.   nicotine 14 mg/24hr patch Commonly known as: NICODERM CQ - dosed in mg/24 hours Place 1 patch (14 mg total) onto the skin daily. Start taking on: April 16, 2023   pantoprazole 40 MG tablet Commonly known as: Protonix Take 1 tablet (40 mg total) by mouth daily.   QUEtiapine 25 MG tablet Commonly known as: SEROQUEL Take 1 tablet (25 mg total) by mouth at bedtime.   thiamine 100 MG tablet Commonly known as: Vitamin B-1 Take 1 tablet (100 mg total) by mouth daily.               Durable Medical Equipment  (From admission, onward)           Start     Ordered   04/12/23 1559  For home use only DME Bedside commode  Once       Question:  Patient needs a bedside commode to treat with the following condition  Answer:  Gait difficulty   04/12/23 1559   04/12/23 1558  For home use only DME Walker rolling  Once       Comments: Patient at high risk for falls when  taking steps without AD, safer using RW with good return for use demonstrated.  Question Answer Comment  Walker: With 5 Inch Wheels   Patient needs a walker to treat with the following condition Gait difficulty      04/12/23 1558            Follow-up Information     Muse, Lorayne Marek D., PA-C. Schedule an appointment as soon as possible for a visit in 10 day(s).   Contact information: [address] [place] [address] [phone]                Discharge Exam: Filed Weights   04/11/23 0417 04/12/23 0500 04/14/23 0509  Weight: 63.1 kg 63.1 kg 61.9 kg   General exam: Alert, awake, oriented x 3; no further seizure activity appreciated.  Following commands appropriately and denying chest pain, shortness of breath, nausea or vomiting.  Feeling ready to go home. Respiratory system: Clear to auscultation. Respiratory effort normal.  Good saturation on room air. Cardiovascular system: Rate controlled; no rubs, no gallops, no JVD. Gastrointestinal system: Abdomen is nondistended, soft and nontender. No organomegaly or masses felt. Normal bowel sounds heard. Central nervous system: Alert and oriented. No focal neurological deficits. Extremities: No cyanosis or clubbing; no edema. Skin: No petechiae. Psychiatry: Judgement and insight appear normal. Mood & affect appropriate.    Condition at  discharge: Stable and improved.  The results of significant diagnostics from this hospitalization (including imaging, microbiology, ancillary and laboratory) are listed below for reference.   Imaging Studies: DG Chest Port 1 View  Result Date: 04/08/2023 CLINICAL DATA:  [id number] with dyspnea and respiratory abnormality. EXAM: PORTABLE CHEST 1 VIEW COMPARISON:  PA Lat 12/29/2008 FINDINGS: The heart size and mediastinal contours are within normal limits. Both lungs are clear. The visualized skeletal structures are unremarkable. There is a tangle of overlying monitor wiring. IMPRESSION:  No acute radiographic chest findings or changes. Electronically Signed   By: Almira Bar M.D.   On: 04/08/2023 04:43   US Abdomen Limited RUQ (LIVER/GB)  Result Date: 04/06/2023 CLINICAL DATA:  Elevated LFTs EXAM: ULTRASOUND ABDOMEN LIMITED RIGHT UPPER QUADRANT COMPARISON:  None Available. FINDINGS: Gallbladder: No gallstones or wall thickening visualized. No sonographic Murphy sign noted by sonographer. Common bile duct: Diameter: 4 mm Liver: Loosely increased in echogenicity without focal mass. Portal vein is patent on color Doppler imaging with normal direction of blood flow towards the liver. Other: None. IMPRESSION: Fatty liver. No acute abnormality noted. Electronically Signed   By: Alcide Clever M.D.   On: 04/06/2023 09:19   CT Head Wo Contrast  Result Date: 04/05/2023 CLINICAL DATA:  Seizure EXAM: CT HEAD WITHOUT CONTRAST TECHNIQUE: Contiguous axial images were obtained from the base of the skull through the vertex without intravenous contrast. RADIATION DOSE REDUCTION: This exam was performed according to the departmental dose-optimization program which includes automated exposure control, adjustment of the mA and/or kV according to patient size and/or use of iterative reconstruction technique. COMPARISON:  07/03/2018 FINDINGS: Motion degraded images. Brain: No evidence of acute infarction, hemorrhage, hydrocephalus, extra-axial collection or mass lesion/mass effect. Global cortical atrophy, out of proportion for age. Mild subcortical white matter and periventricular small vessel ischemic changes. Vascular: Intracranial atherosclerosis. Skull: Normal. Negative for fracture or focal lesion. Sinuses/Orbits: The visualized paranasal sinuses are essentially clear. The mastoid air cells are unopacified. Other: None. IMPRESSION: Motion degraded images. No acute intracranial abnormality. Atrophy with small vessel ischemic changes. Electronically Signed   By: Charline Bills M.D.   On: 04/05/2023 20:32     Microbiology: Results for orders placed or performed during the hospital encounter of 04/05/23  MRSA Next Gen by PCR, Nasal     Status: None   Collection Time: 04/05/23 10:46 PM   Specimen: Nasal Mucosa; Nasal Swab  Result Value Ref Range Status   MRSA by PCR Next Gen NOT DETECTED NOT DETECTED Final    Comment: (NOTE) The GeneXpert MRSA Assay (FDA approved for NASAL specimens only), is one component of a comprehensive MRSA colonization surveillance program. It is not intended to diagnose MRSA infection nor to guide or monitor treatment for MRSA infections. Test performance is not FDA approved in patients less than 81 years old. Performed at [facility], [address] [place], [place] [address]   Culture, blood (Routine X 2) w Reflex to ID Panel     Status: None   Collection Time: 04/07/23  7:00 PM   Specimen: BLOOD  Result Value Ref Range Status   Specimen Description BLOOD BLOOD RIGHT ARM  Final   Special Requests   Final    BOTTLES DRAWN AEROBIC AND ANAEROBIC Blood Culture adequate volume   Culture   Final    NO GROWTH 5 DAYS Performed at [facility], [address] [place], [place] [address]    Report Status 04/12/2023 FINAL  Final  Culture, blood (Routine X  2) w Reflex to ID Panel     Status: None   Collection Time: 04/07/23  7:06 PM   Specimen: BLOOD  Result Value Ref Range Status   Specimen Description BLOOD BLOOD RIGHT ARM  Final   Special Requests   Final    BOTTLES DRAWN AEROBIC AND ANAEROBIC Blood Culture results may not be optimal due to an excessive volume of blood received in culture bottles   Culture   Final    NO GROWTH 5 DAYS Performed at [facility], [address] [place], [place] [address]    Report Status 04/12/2023 FINAL  Final    Labs: CBC: Recent Labs  Lab 04/10/23 0503 04/12/23 0529 04/14/23 0507  WBC 6.3 5.5 5.3  HGB 12.3 11.3* 11.0*  HCT 36.6 34.3* 32.4*  MCV 93.6 94.0 92.0  PLT 96* 141* 202   Basic Metabolic Panel: Recent Labs   Lab 04/09/23 0411 04/10/23 0827 04/12/23 0529 04/14/23 0507 04/15/23 0452  NA 141 137 137 137 137  K 3.1* 3.5 3.4* 3.2* 3.6  CL 112* 107 109 106 109  CO2 19* 17* 21* 24 21*  GLUCOSE 90 95 114* 103* 109*  BUN <5* 6 5* 7 5*  CREATININE 0.43* 0.57 0.60 0.57 0.52  CALCIUM 9.8 9.4 9.2 9.7 9.8  MG  --   --   --   --  1.7  PHOS  --   --   --  3.7  --    Liver Function Tests: Recent Labs  Lab 04/09/23 0411 04/10/23 0827 04/12/23 0529 04/14/23 0507  AST 57* 48* 34  --   ALT 41 36 26  --   ALKPHOS 68 75 62  --   BILITOT 0.7 0.9 0.5  --   PROT 6.6 7.0 6.5  --   ALBUMIN 3.5 3.5 3.3* 3.2*   CBG: Recent Labs  Lab 04/09/23 1051  GLUCAP 101*    Discharge time spent: greater than 30 minutes.  Signed: Vassie Loll, MD [facility] 04/15/2023

## 2023-04-15 NOTE — Progress Notes (Signed)
Physical Therapy Treatment Patient Details Name: Heidi [name] MRN: [id number] DOB: 30-Jun-1971 Today's Date: 04/15/2023   History of Present Illness Heidi [name] is a 52 y.o. female with medical history significant of hypertension, alcohol use disorder, tobacco use disorder, and more presents the ED chief complaint of seizure.  Patient reports that she members getting up and walking around the house, then she fell.  She is very poor historian, constantly falling asleep outlined the history, mumbling, and quite honestly I do not think she remembers much of it.  She reports she did lose consciousness.  She is not sure if she hit her head.  She reports somebody told her she is having a seizure.  She reports she quit drinking in the past for 8 months and did not have seizures at that time.  She was not able to answer if she noticed herself being tremulous before that.  She has been nauseous, having palpitation, having chest pain etc.  Patient just kept falling asleep before answering his questions.  Eventually she started falling asleep before the question was fully repeated.  This is likely due to her Ativan, which is necessary for her withdrawal symptoms.  Unfortunately, it means I did not get much of a history.    PT Comments    Pt sitting on EOB with family present in room.  Friendly and A&Ox3, willing to participate with therapy today.  Pt I bed mobility.  Presents with slight instability upon standing required min A to assist with pulling up her pants for safety.  Pt able to complete 150ft gait training with RW for stabilty, no LOB but uncoordinated with step through pattern.      Recommendations for follow up therapy are one component of a multi-disciplinary discharge planning process, led by the attending physician.  Recommendations may be updated based on patient status, additional functional criteria and insurance authorization.  Follow Up Recommendations       Assistance Recommended at  Discharge    Patient can return home with the following     Equipment Recommendations       Recommendations for Other Services       Precautions / Restrictions Precautions Precautions: Fall Restrictions Weight Bearing Restrictions: No     Mobility  Bed Mobility               General bed mobility comments: pt sitting on EOB at therapist's entrance    Transfers Overall transfer level: Needs assistance Equipment used: Rolling walker (2 wheels), 1 person hand held assist Transfers: Sit to/from Stand Sit to Stand: Min guard           General transfer comment: pt attempted to pull up pants upon standing, unsteady on feet min guard/A for safety.  RW for stability with standing and gait    Ambulation/Gait Ambulation/Gait assistance: Min guard, Min assist Gait Distance (Feet): 175 Feet Assistive device: Rolling walker (2 wheels) Gait Pattern/deviations: Decreased step length - left, Decreased stance time - right, Decreased stride length Gait velocity: decreased     General Gait Details: unsteady labored cadence without loss of balance using RW, limited due to fatigue   Stairs             Wheelchair Mobility    Modified Rankin (Stroke Patients Only)       Balance  Cognition Arousal/Alertness: Awake/alert Behavior During Therapy: WFL for tasks assessed/performed Overall Cognitive Status: Within Functional Limits for tasks assessed                                          Exercises      General Comments        Pertinent Vitals/Pain Pain Assessment Pain Assessment: No/denies pain    Home Living                          Prior Function            PT Goals (current goals can now be found in the care plan section)      Frequency           PT Plan      Co-evaluation              AM-PAC PT "6 Clicks" Mobility   Outcome Measure   Help needed turning from your back to your side while in a flat bed without using bedrails?: None Help needed moving from lying on your back to sitting on the side of a flat bed without using bedrails?: None Help needed moving to and from a bed to a chair (including a wheelchair)?: A Little Help needed standing up from a chair using your arms (e.g., wheelchair or bedside chair)?: A Little Help needed to walk in hospital room?: A Little Help needed climbing 3-5 steps with a railing? : A Lot 6 Click Score: 19    End of Session Equipment Utilized During Treatment: Gait belt Activity Tolerance: Patient tolerated treatment well;Patient limited by fatigue Patient left: in bed;with call bell/phone within reach (sitting on EOB) Nurse Communication: Mobility status PT Visit Diagnosis: Unsteadiness on feet (R26.81);Other abnormalities of gait and mobility (R26.89);Muscle weakness (generalized) (M62.81)     Time: 8295-6213 PT Time Calculation (min) (ACUTE ONLY): 25 min  Charges:  $Gait Training: 8-22 mins $Therapeutic Activity: 8-22 mins                     Becky Sax, LPTA/CLT; CBIS [phone]  Juel Burrow 04/15/2023, 12:52 PM

## 2023-04-15 NOTE — Progress Notes (Signed)
Patient had urgency to urinate this shift. Her heart rate would go from 103 to 150s during movement. Ativan given once this shift for anxiety. Continued to monitor.

## 2023-04-15 NOTE — Progress Notes (Signed)
PT Cancellation Note  Patient Details Name: Heidi [name] MRN: [id number] DOB: June 05, 1971   Cancelled Treatment:    Reason Eval/Treat Not Completed: Fatigue/lethargy limiting ability to participate Attempted PT session, unable to arouse.  Will attempted later today if available.  Becky Sax, LPTA/CLT; CBIS [phone]  Juel Burrow 04/15/2023, 8:48 AM

## 2023-04-19 ENCOUNTER — Encounter: Payer: Self-pay | Admitting: Internal Medicine

## 2023-04-19 ENCOUNTER — Ambulatory Visit (INDEPENDENT_AMBULATORY_CARE_PROVIDER_SITE_OTHER): Payer: Medicaid Other | Admitting: Internal Medicine

## 2023-04-19 ENCOUNTER — Telehealth: Payer: Self-pay | Admitting: Family Medicine

## 2023-04-19 VITALS — BP 138/85 | HR 129 | Resp 16 | Ht 64.0 in | Wt 135.0 lb

## 2023-04-19 DIAGNOSIS — R000 Tachycardia, unspecified: Secondary | ICD-10-CM

## 2023-04-19 DIAGNOSIS — G5601 Carpal tunnel syndrome, right upper limb: Secondary | ICD-10-CM | POA: Diagnosis not present

## 2023-04-19 DIAGNOSIS — I10 Essential (primary) hypertension: Secondary | ICD-10-CM

## 2023-04-19 DIAGNOSIS — F1090 Alcohol use, unspecified, uncomplicated: Secondary | ICD-10-CM

## 2023-04-19 MED ORDER — NALTREXONE HCL 50 MG PO TABS
50.0000 mg | ORAL_TABLET | Freq: Every day | ORAL | 1 refills | Status: DC
Start: 2023-04-19 — End: 2023-05-18

## 2023-04-19 NOTE — Patient Instructions (Addendum)
Thank you, Ms.[name] for allowing [place] to provide your care today.   I have ordered the following labs for you:   Lab Orders         CBC with Differential/Platelet         CMP14+EGFR          Reminders: You have carpal tunnel syndrome of your right wrist.  Start wearing the wrist splint daily if you can.  If unable to wear during the day, wear at night only.  Start the medication called naltrexone.  I sent this to your pharmacy.  This will help with the cravings to drink alcohol.  Take Librium as prescribed this will help you from going into withdrawal from alcohol.  Follow-up in 1 week for evaluation.    Thurmon Fair, M.D.

## 2023-04-19 NOTE — Progress Notes (Unsigned)
   HPI:Ms.MARAL LAMPE is a 52 y.o. female who presents for evaluation of Establish Care and Hospitalization Follow-up (D/c 4/25 for alcohol withdrawal and possible seizure) . For the details of today's visit, please refer to the assessment and plan.   Physical Exam: Vitals:   04/19/23 1526  BP: 138/85  Pulse: (!) 129  Resp: 16  SpO2: 95%  Weight: 135 lb (61.2 kg)  Height: 5\' 4"  (1.626 m)     Physical Exam   Assessment & Plan:   There are no diagnoses linked to this encounter.    Milus Banister, MD

## 2023-04-19 NOTE — Telephone Encounter (Signed)
Proof of medical exam   Noted   Copied  Sleeved   Original In pcp box Copy front desk

## 2023-04-20 ENCOUNTER — Encounter: Payer: Self-pay | Admitting: Internal Medicine

## 2023-04-20 DIAGNOSIS — F1090 Alcohol use, unspecified, uncomplicated: Secondary | ICD-10-CM | POA: Insufficient documentation

## 2023-04-20 DIAGNOSIS — R000 Tachycardia, unspecified: Secondary | ICD-10-CM | POA: Insufficient documentation

## 2023-04-20 DIAGNOSIS — G5601 Carpal tunnel syndrome, right upper limb: Secondary | ICD-10-CM | POA: Insufficient documentation

## 2023-04-20 LAB — CBC WITH DIFFERENTIAL/PLATELET
Basophils Absolute: 0.1 10*3/uL (ref 0.0–0.2)
Basos: 2 %
EOS (ABSOLUTE): 0.1 10*3/uL (ref 0.0–0.4)
Eos: 1 %
Hematocrit: 32.7 % — ABNORMAL LOW (ref 34.0–46.6)
Hemoglobin: 11.2 g/dL (ref 11.1–15.9)
Immature Grans (Abs): 0.1 10*3/uL (ref 0.0–0.1)
Immature Granulocytes: 2 %
Lymphocytes Absolute: 1.8 10*3/uL (ref 0.7–3.1)
Lymphs: 22 %
MCH: 30.7 pg (ref 26.6–33.0)
MCHC: 34.3 g/dL (ref 31.5–35.7)
MCV: 90 fL (ref 79–97)
Monocytes Absolute: 0.8 10*3/uL (ref 0.1–0.9)
Monocytes: 10 %
Neutrophils Absolute: 5.4 10*3/uL (ref 1.4–7.0)
Neutrophils: 63 %
Platelets: 362 10*3/uL (ref 150–450)
RBC: 3.65 x10E6/uL — ABNORMAL LOW (ref 3.77–5.28)
RDW: 13.6 % (ref 11.7–15.4)
WBC: 8.4 10*3/uL (ref 3.4–10.8)

## 2023-04-20 LAB — CMP14+EGFR
ALT: 22 IU/L (ref 0–32)
AST: 28 IU/L (ref 0–40)
Albumin/Globulin Ratio: 1.6 (ref 1.2–2.2)
Albumin: 4.7 g/dL (ref 3.9–4.9)
Alkaline Phosphatase: 82 IU/L (ref 44–121)
BUN/Creatinine Ratio: 10 (ref 9–23)
BUN: 6 mg/dL (ref 6–24)
Bilirubin Total: 0.3 mg/dL (ref 0.0–1.2)
CO2: 20 mmol/L (ref 20–29)
Calcium: 10.6 mg/dL — ABNORMAL HIGH (ref 8.7–10.2)
Chloride: 107 mmol/L — ABNORMAL HIGH (ref 96–106)
Creatinine, Ser: 0.62 mg/dL (ref 0.57–1.00)
Globulin, Total: 2.9 g/dL (ref 1.5–4.5)
Glucose: 81 mg/dL (ref 70–99)
Potassium: 4 mmol/L (ref 3.5–5.2)
Sodium: 143 mmol/L (ref 134–144)
Total Protein: 7.6 g/dL (ref 6.0–8.5)
eGFR: 109 mL/min/{1.73_m2} (ref >59)

## 2023-04-20 NOTE — Assessment & Plan Note (Addendum)
Patient started on Cardizem in hospital for HTN and sinus tachycardia in setting of alcohol withdrawal. She is tachycardic today. Her BP is 138/85. Patient has continued to drink alcohol.  Continue Cardizem 120 mg for now.

## 2023-04-20 NOTE — Assessment & Plan Note (Signed)
Asymptomatic .Patient was in sinus tachycardia on review of 4/16 EKG. Suspect this is in setting of decrease intake and alcohol use.  Encourage increase PO intake and avoid alcohol use.

## 2023-04-20 NOTE — Assessment & Plan Note (Signed)
Patient has pain in her right wrist she has been experiencing for a few weeks. Pain reproduced with tinel's and Phalen test. No thenar muscle atrophy. No repetitive activities identified. She is using a walker to ambulate.  Recommend wrist splint Can consider steroid injection if not improving

## 2023-04-20 NOTE — Assessment & Plan Note (Addendum)
Patient has resumed drinking alcohol after recent hospitalization, drinking a 40oz beer per day. She has been drinking today. She did not start librium taper and says she did not understand what medication was for. She lives with her mom and brother. Her mother drinks alcohol regularly and her brother does not.   Patient will start naltrexone to help with cravings for alcohol use Start librium taper Avoid purchasing alcohol, and remove alcohol from home Follow up in one week to check progress

## 2023-04-26 ENCOUNTER — Ambulatory Visit (INDEPENDENT_AMBULATORY_CARE_PROVIDER_SITE_OTHER): Payer: Medicaid Other | Admitting: Family Medicine

## 2023-04-26 ENCOUNTER — Encounter: Payer: Self-pay | Admitting: Family Medicine

## 2023-04-26 VITALS — BP 120/75 | HR 89 | Ht 63.0 in | Wt 136.0 lb

## 2023-04-26 DIAGNOSIS — F339 Major depressive disorder, recurrent, unspecified: Secondary | ICD-10-CM

## 2023-04-26 DIAGNOSIS — M25561 Pain in right knee: Secondary | ICD-10-CM | POA: Diagnosis not present

## 2023-04-26 DIAGNOSIS — I10 Essential (primary) hypertension: Secondary | ICD-10-CM | POA: Diagnosis not present

## 2023-04-26 DIAGNOSIS — F1090 Alcohol use, unspecified, uncomplicated: Secondary | ICD-10-CM

## 2023-04-26 DIAGNOSIS — M25562 Pain in left knee: Secondary | ICD-10-CM | POA: Insufficient documentation

## 2023-04-26 DIAGNOSIS — G8929 Other chronic pain: Secondary | ICD-10-CM

## 2023-04-26 MED ORDER — CYCLOBENZAPRINE HCL 5 MG PO TABS
5.0000 mg | ORAL_TABLET | Freq: Three times a day (TID) | ORAL | 0 refills | Status: DC | PRN
Start: 2023-04-26 — End: 2023-05-18

## 2023-04-26 NOTE — Assessment & Plan Note (Signed)
Vitals:   04/26/23 1000 04/26/23 1051  BP: 119/82 120/75   Blood pressure controlled in today's visit Continue Cardizem 120 mg for now.  Continued discussion on DASH diet, low sodium diet and maintain a exercise routine for 150 minutes per week.

## 2023-04-26 NOTE — Assessment & Plan Note (Signed)
Flowsheet Row Office Visit from 04/26/2023 in [facility]  PHQ-9 Total Score 20      Currently on Seroquel 25 mg daily Discussed about cognitive behavioral therapy focusing on thoughts, belief, and attitudes that affects feelings and behavior, learning about coping skills to deal with certain problems. Maintaining a consistent routine and schedule, Practice stress management and self calming techniques, excersise regularly and spend time outdoors, Do not eat food that are high in fat, added sugar, or salt. Follow up in 4 weeks. Referral to behavior health    Patient  verbally consented to [facility] services about presenting concerns and psychiatric consultation as appropriate. The services will be billed as appropriate for the patient.

## 2023-04-26 NOTE — Assessment & Plan Note (Signed)
Flexeril 5 mg PRN. Xray ordered- awaiting results will follow up. Explained to patient Non pharmacological interventions include the use of ice or heat, rest, recommend range of motion exercises, gentle stretching. Follow up for worsening or persistent symptoms. Patient verbalizes understanding regarding plan of care and all questions answered.

## 2023-04-26 NOTE — Patient Instructions (Signed)
        Great to see you today.   - Please take medications as prescribed. - Follow up with your primary health provider if any health concerns arises. - If symptoms worsen please contact your primary care provider and/or visit the emergency department.  

## 2023-04-26 NOTE — Assessment & Plan Note (Signed)
Patient reported she trying to cut back on drinking alcohol, Advise patient to start not to drink alcohol for one week and increase weekly. Throw away any alcohol from home.  Start naltrexone once you stop drinking alcohol And being librium taper Follow up in 4 weeks

## 2023-04-26 NOTE — Progress Notes (Signed)
Patient Office Visit   Subjective   Patient ID: Heidi [name], female    DOB: May 24, 1971  Age: 52 y.o. MRN: [id number]  CC:  Chief Complaint  Patient presents with   Hypertension    Patient was here to establish care a week ago, saw Dr. Barbaraann Faster. Needs to do f/u for HTN, ETOH, hx of seizures.     HPI Heidi [name] 52 year old female, presents to the clinic for follow up. She  has a past medical history of Alcohol withdrawal (HCC) and Cervical radiculopathy.  Knee Pain  There was no injury mechanism. The pain is present in the left knee and right knee. The quality of the pain is described as aching and cramping. The pain is at a severity of 10/10. The pain has been Constant since onset. Associated symptoms include muscle weakness and numbness. Pertinent negatives include no loss of motion, loss of sensation or tingling. She reports no foreign bodies present. The symptoms are aggravated by movement and weight bearing. She has tried NSAIDs and acetaminophen for the symptoms. The treatment provided no relief.      Outpatient Encounter Medications as of 04/26/2023  Medication Sig   chlordiazePOXIDE (LIBRIUM) 5 MG capsule Take 2 tablet by mouth 3 times a day x 2 days; then 1 tablet by mouth 3 times a day x 2 days; then 1 tablet by mouth twice a day x 2 days; then 1 tablet by mouth daily x 3 days and stop Librium.   cyanocobalamin (VITAMIN B12) 1000 MCG tablet Take 1 tablet (1,000 mcg total) by mouth daily.   cyclobenzaprine (FLEXERIL) 5 MG tablet Take 1 tablet (5 mg total) by mouth 3 (three) times daily as needed for muscle spasms.   diltiazem (CARDIZEM CD) 120 MG 24 hr capsule Take 1 capsule (120 mg total) by mouth daily.   folic acid (FOLVITE) 1 MG tablet Take 1 tablet (1 mg total) by mouth daily.   Multiple Vitamin (MULTIVITAMIN WITH MINERALS) TABS tablet Take 1 tablet by mouth daily.   naltrexone (DEPADE) 50 MG tablet Take 1 tablet (50 mg total) by mouth daily.   nicotine (NICODERM CQ -  DOSED IN MG/24 HOURS) 14 mg/24hr patch Place 1 patch (14 mg total) onto the skin daily.   pantoprazole (PROTONIX) 40 MG tablet Take 1 tablet (40 mg total) by mouth daily.   QUEtiapine (SEROQUEL) 25 MG tablet Take 1 tablet (25 mg total) by mouth at bedtime.   thiamine (VITAMIN B-1) 100 MG tablet Take 1 tablet (100 mg total) by mouth daily.   No facility-administered encounter medications on file as of 04/26/2023.    No past surgical history on file.  Review of Systems  Constitutional:  Negative for chills and fever.  HENT:  Negative for hearing loss.   Respiratory:  Negative for cough.   Cardiovascular:  Negative for chest pain.  Gastrointestinal:  Negative for abdominal pain.  Genitourinary:  Negative for dysuria.  Musculoskeletal:  Positive for joint pain and myalgias.  Neurological:  Positive for numbness. Negative for tingling.  Psychiatric/Behavioral:  Positive for depression.       Objective    BP 120/75   Pulse 89   Ht 5\' 3"  (1.6 m)   Wt 136 lb (61.7 kg)   SpO2 97%   BMI 24.09 kg/m   Physical Exam Vitals reviewed.  Constitutional:      General: She is not in acute distress.    Appearance: Normal appearance. She is not ill-appearing,  toxic-appearing or diaphoretic.  HENT:     Head: Normocephalic.  Eyes:     General:        Right eye: No discharge.        Left eye: No discharge.     Conjunctiva/sclera: Conjunctivae normal.  Cardiovascular:     Rate and Rhythm: Normal rate.     Pulses: Normal pulses.     Heart sounds: Normal heart sounds.  Pulmonary:     Effort: Pulmonary effort is normal. No respiratory distress.     Breath sounds: Normal breath sounds.  Abdominal:     General: Bowel sounds are normal.     Palpations: Abdomen is soft.     Tenderness: There is no abdominal tenderness.  Musculoskeletal:        General: Normal range of motion.     Cervical back: Normal range of motion.  Skin:    General: Skin is warm and dry.     Capillary Refill: Capillary  refill takes less than 2 seconds.  Neurological:     General: No focal deficit present.     Mental Status: She is alert and oriented to person, place, and time.     Coordination: Coordination abnormal.     Gait: Gait abnormal.  Psychiatric:        Mood and Affect: Mood normal.        Behavior: Behavior normal.       Assessment & Plan:  Chronic pain of both knees Assessment & Plan: Flexeril 5 mg PRN. Xray ordered- awaiting results will follow up. Explained to patient Non pharmacological interventions include the use of ice or heat, rest, recommend range of motion exercises, gentle stretching. Follow up for worsening or persistent symptoms. Patient verbalizes understanding regarding plan of care and all questions answered.   Orders: -     Cyclobenzaprine HCl; Take 1 tablet (5 mg total) by mouth 3 (three) times daily as needed for muscle spasms.  Dispense: 30 tablet; Refill: 0 -     DG Knee 1-2 Views Right; Future -     DG Knee 1-2 Views Left; Future  Depression, recurrent [facility]) Assessment & Plan: Flowsheet Row Office Visit from 04/26/2023 in [facility]  PHQ-9 Total Score 20      Currently on Seroquel 25 mg daily Discussed about cognitive behavioral therapy focusing on thoughts, belief, and attitudes that affects feelings and behavior, learning about coping skills to deal with certain problems. Maintaining a consistent routine and schedule, Practice stress management and self calming techniques, excersise regularly and spend time outdoors, Do not eat food that are high in fat, added sugar, or salt. Follow up in 4 weeks. Referral to behavior health    Patient  verbally consented to [facility] services about presenting concerns and psychiatric consultation as appropriate. The services will be billed as appropriate for the patient.    Orders: -     Ambulatory referral to Behavioral Health  Primary hypertension Assessment &  Plan: Vitals:   04/26/23 1000 04/26/23 1051  BP: 119/82 120/75   Blood pressure controlled in today's visit Continue Cardizem 120 mg for now.  Continued discussion on DASH diet, low sodium diet and maintain a exercise routine for 150 minutes per week.    Alcohol use disorder Assessment & Plan: Patient reported she trying to cut back on drinking alcohol, Advise patient to start not to drink alcohol for one week and increase weekly. Throw away any alcohol from home.  Start naltrexone once you stop drinking alcohol And being librium taper Follow up in 4 weeks       Return in about 1 month (around 05/27/2023) for chronic follow-up.   Cruzita Lederer Newman Nip, FNP

## 2023-04-28 ENCOUNTER — Other Ambulatory Visit: Payer: Self-pay | Admitting: Family Medicine

## 2023-04-29 ENCOUNTER — Ambulatory Visit (HOSPITAL_COMMUNITY)
Admission: RE | Admit: 2023-04-29 | Discharge: 2023-04-29 | Disposition: A | Discharge status: Home / Self Care | Payer: Medicaid Other | Source: Ambulatory Visit | Attending: Family Medicine | Admitting: Family Medicine

## 2023-04-29 DIAGNOSIS — M25561 Pain in right knee: Secondary | ICD-10-CM | POA: Diagnosis present

## 2023-04-29 DIAGNOSIS — G8929 Other chronic pain: Secondary | ICD-10-CM | POA: Insufficient documentation

## 2023-04-29 DIAGNOSIS — M25562 Pain in left knee: Secondary | ICD-10-CM | POA: Diagnosis present

## 2023-05-06 ENCOUNTER — Institutional Professional Consult (permissible substitution): Payer: Medicaid Other | Admitting: Professional Counselor

## 2023-05-06 ENCOUNTER — Ambulatory Visit: Payer: Medicaid Other | Admitting: Professional Counselor

## 2023-05-06 DIAGNOSIS — F1090 Alcohol use, unspecified, uncomplicated: Secondary | ICD-10-CM

## 2023-05-06 NOTE — BH Specialist Note (Cosign Needed Addendum)
05/06/2023 Heidi [name] [id number]  Number of Integrated Behavioral Health Clinician visits: 1- Initial Visit  Session Start time: 1015   Session End time: 1100  Total time in minutes: 45   Referring Provider: [place] Records Patient/Family location: Home [facility] Provider location: [place] [facility] Types of Service: Collaborative care  I connected with Heidi [name] via Telephone or Video Enabled Telemedicine Application  (Video is Caregility application) and verified that I am speaking with the correct person using two identifiers. Discussed confidentiality: Yes   I discussed the limitations of telemedicine and the availability of in person appointments.  Discussed there is a possibility of technology failure and discussed alternative modes of communication if that failure occurs.  I discussed that engaging in this telemedicine visit, they consent to the provision of behavioral healthcare and the services will be billed under their insurance.  Patient and/or legal guardian expressed understanding and consented to Telemedicine visit: Yes   I discussed the assessment and treatment plan with the patient and/or parent/guardian. They were provided an opportunity to ask questions and all were answered. They agreed with the plan and demonstrated an understanding of the instructions.   They were advised to call back or seek an in-person evaluation if the symptoms worsen or if the condition fails to improve as anticipated.  Collaborative Care Initial Assessment   Summary  Patient is a 52 y/o female presenting to collaborative being referred by her PCP for Alcohol Use Disorder Severe. Patient missed her in person appointment and explained she had no transportation. [facility] reached out via telephone to conduct initial visit. Patient was open to this option.  Diagnosis: 1. Alcohol use disorder   Goals: Patient is seeking a help to manage her alcohol use  disorder   Interventions: Motivational Interviewing Patient is in the contemplation stage of change as she presents as ambivalent. She admits the severity of her alcohol use but is unwilling to seek inpatient treatment. Patient reports "I dont want to be locked away I can't miss my sons graduation". [facility] explains that detox and inpatient treatment is voluntary. She declined the recommendation to go to inpatient substance abuse treatment.   Follow-up Plan: Refer to substance abuse inpatient treatment due to the risk of seizure from alcohol withdraw. Patient needs medical detoxification and ongoing substance abuse services.  Type of Contact:  Phone Call Patient consent obtained:  Yes or No Types of Service: Collaborative care   Reason for referral in patient/family's own words:  "I had a seizure"  Patient's goal for today's visit: "I dont like feeling like this I want a better life"   History of Present illness:   Patient is a 52 y/o female with a history of alcohol use disorder and cervical radiculopathy who presents to collaborative care after recently establishing care with her PCP. Patient was seen for follow-up after an ED visit for alcohol withdraw andd potential alcohol induced seizure. Patient resumed alcohol use and returned to PCP stating she is trying to "cut back". PCP prescribed librium taper and recommended weaning off alcohol slowly then starting naltrexone. Patient presented today reporting she is still drinking and she is unsure about if medication is working. Patient reports drinking a few times a week having 2-3 drinking during those times. It was unclear if she was taking the liberum as prescribed as evidenced by patient stating "I can't drink as much with my medication". Patient is in the contemplation stage of change she presents as ambivalent. She admits the severity  of her alcohol use but is unwilling to seek inpatient treatment. Patient reports "I dont want to be locked  away I can't miss my sons graduation". [facility] explains that detox and inpatient treatment is voluntary and that it is the best option for safe detoxification and treatment for alcohol use disorder. She declined the recommendation to go to inpatient substance abuse treatment. Patient reports wanting a better life explaining that she doesn't like feeling like she does. Patient lost her husband 3 years ago and had to move into her mothers house. She has not been able to gain employment for several years. Reports having a supportive family but lacks knowledge and skills to make necessary changes to improve functioning. Patient denies SI/Hi.    Social History:  Household: "I have a safe and supportive household" Lives with Mother Marital status: Husband deceased Number of Children: 4 Employment: No haven't worked in 3 years Education: NA  Psychiatric Review of systems: Insomnia: Endorses sleep disturbances. Patient explains she "wakes up in the night and watches tv to help fall back asleep". Changes in appetite: "Not Good". Patient takes iron pills. Patient explains she needs to eat more has very little appetite.  Decreased need for sleep: Denies Family history of bipolar disorder: Denies Hallucinations: Denies  Paranoia: Denies   Alcohol and/or Substance Use History   Tobacco Alcohol Other substances  Current use  Drinks 3-4 times a week. Has 3-4 drinks on these occasions Denies  Past use  Daily alcohol use to the point of severe dependence. Had seizure when she tried to stop drinking.  Denies  Past treatment  ED    Flowsheet Row Integrated Behavioral Health from 05/06/2023 in [facility]  AUDIT-C Score 8     Patient needs inpatient treatment and medical detoxication based on the severity of her alcohol use. Psychiatric History: Past psychiatry diagnosis: Reports she had a diagnosis when she was young but does not remember. Patient currently being seen by  therapist/psychiatrist: Denies Prior Suicide Attempts: Denies Past psychiatry Hospitalization(s): Denies Past history of violence: Denies  Psychotropic medications: Current medications: Librium Taper Patient taking medications as prescribed:  Yes Reports it impacts her drinking. "I can't drink as much with the medicine" Current medications (medication list) Current Outpatient Medications on File Prior to Visit  Medication Sig Dispense Refill   chlordiazePOXIDE (LIBRIUM) 5 MG capsule Take 2 tablet by mouth 3 times a day x 2 days; then 1 tablet by mouth 3 times a day x 2 days; then 1 tablet by mouth twice a day x 2 days; then 1 tablet by mouth daily x 3 days and stop Librium. 25 capsule 0   cyanocobalamin (VITAMIN B12) 1000 MCG tablet Take 1 tablet (1,000 mcg total) by mouth daily. 30 tablet 1   cyclobenzaprine (FLEXERIL) 5 MG tablet Take 1 tablet (5 mg total) by mouth 3 (three) times daily as needed for muscle spasms. 30 tablet 0   diltiazem (CARDIZEM CD) 120 MG 24 hr capsule Take 1 capsule (120 mg total) by mouth daily. 30 capsule 1   folic acid (FOLVITE) 1 MG tablet Take 1 tablet (1 mg total) by mouth daily. 30 tablet 2   Multiple Vitamin (MULTIVITAMIN WITH MINERALS) TABS tablet Take 1 tablet by mouth daily.     naltrexone (DEPADE) 50 MG tablet Take 1 tablet (50 mg total) by mouth daily. 30 tablet 1   nicotine (NICODERM CQ - DOSED IN MG/24 HOURS) 14 mg/24hr patch Place 1 patch (14 mg total)  onto the skin daily. 28 patch 0   pantoprazole (PROTONIX) 40 MG tablet Take 1 tablet (40 mg total) by mouth daily. 30 tablet 1   QUEtiapine (SEROQUEL) 25 MG tablet Take 1 tablet (25 mg total) by mouth at bedtime. 30 tablet 1   thiamine (VITAMIN B-1) 100 MG tablet Take 1 tablet (100 mg total) by mouth daily. 30 tablet 2   No current facility-administered medications on file prior to visit.       Clinical Assessment (Review screens that patient completed):  PHQ-9 Assessments:    04/26/2023   10:02 AM  04/19/2023    3:35 PM  Depression screen PHQ 2/9  Decreased Interest 3 1  Down, Depressed, Hopeless 2 2  PHQ - 2 Score 5 3  Altered sleeping 3 1  Tired, decreased energy 3 1  Change in appetite 3 1  Feeling bad or failure about yourself  1 1  Trouble concentrating 3 2  Moving slowly or fidgety/restless 2 0  Suicidal thoughts 0 0  PHQ-9 Score 20 9  Difficult doing work/chores Extremely dIfficult Somewhat difficult    GAD-7 Assessments:    04/26/2023   10:02 AM  GAD 7 : Generalized Anxiety Score  Nervous, Anxious, on Edge 2  Control/stop worrying 3  Worry too much - different things 3  Trouble relaxing 3  Restless 1  Easily annoyed or irritable 3  Afraid - awful might happen 1  Total GAD 7 Score 16  Anxiety Difficulty Extremely difficult     Self-harm Behaviors Risk Assessment Self-harm risk factors:  Financial stress, grief, alcohol use   Patient endorses recent thoughts of harming self:  Denies current suicidal thoughts, intent and plan. [place] Suicide Severity Rating Scale:   Guns in the home:  denies   Protective factors: Patient reports her son is graduating high school in a few weeks and she would never miss that. She also reports her other son is having a grandson and she wants to be a part of their life.   Danger to Others Risk Assessment Danger to others risk factors:  denies Patient endorses recent thoughts of harming others:  denies Dynamic Appraisal of Situational Aggression (DASA):    BH Counselor discussed emergency crisis plan with client and provided local emergency services resources. [facility] recommended patient go to nearest emergency room if symptoms of withdraw occour due to severity of risk for alcohol induced seizure.  Clinician Name & Credentials Reuel Boom

## 2023-05-08 ENCOUNTER — Emergency Department (HOSPITAL_COMMUNITY)
Admission: EM | Admit: 2023-05-08 | Discharge: 2023-05-08 | Disposition: A | Discharge status: Home / Self Care | Payer: Medicaid Other | Attending: Emergency Medicine | Admitting: Emergency Medicine

## 2023-05-08 ENCOUNTER — Other Ambulatory Visit: Payer: Self-pay

## 2023-05-08 DIAGNOSIS — F10230 Alcohol dependence with withdrawal, uncomplicated: Secondary | ICD-10-CM | POA: Diagnosis not present

## 2023-05-08 DIAGNOSIS — R112 Nausea with vomiting, unspecified: Secondary | ICD-10-CM | POA: Diagnosis present

## 2023-05-08 DIAGNOSIS — F10930 Alcohol use, unspecified with withdrawal, uncomplicated: Secondary | ICD-10-CM

## 2023-05-08 DIAGNOSIS — Y905 Blood alcohol level of 100-119 mg/100 ml: Secondary | ICD-10-CM | POA: Diagnosis not present

## 2023-05-08 LAB — CBC WITH DIFFERENTIAL/PLATELET
Abs Immature Granulocytes: 0.02 10*3/uL (ref 0.00–0.07)
Basophils Absolute: 0 10*3/uL (ref 0.0–0.1)
Basophils Relative: 1 %
Eosinophils Absolute: 0 10*3/uL (ref 0.0–0.5)
Eosinophils Relative: 0 %
HCT: 38.2 % (ref 36.0–46.0)
Hemoglobin: 13.1 g/dL (ref 12.0–15.0)
Immature Granulocytes: 0 %
Lymphocytes Relative: 13 %
Lymphs Abs: 0.9 10*3/uL (ref 0.7–4.0)
MCH: 31.2 pg (ref 26.0–34.0)
MCHC: 34.3 g/dL (ref 30.0–36.0)
MCV: 91 fL (ref 80.0–100.0)
Monocytes Absolute: 0.3 10*3/uL (ref 0.1–1.0)
Monocytes Relative: 5 %
Neutro Abs: 5.2 10*3/uL (ref 1.7–7.7)
Neutrophils Relative %: 81 %
Platelets: 100 10*3/uL — ABNORMAL LOW (ref 150–400)
RBC: 4.2 MIL/uL (ref 3.87–5.11)
RDW: 13.7 % (ref 11.5–15.5)
WBC: 6.4 10*3/uL (ref 4.0–10.5)
nRBC: 0 % (ref 0.0–0.2)

## 2023-05-08 LAB — COMPREHENSIVE METABOLIC PANEL
ALT: 22 U/L (ref 0–44)
AST: 38 U/L (ref 15–41)
Albumin: 4.6 g/dL (ref 3.5–5.0)
Alkaline Phosphatase: 84 U/L (ref 38–126)
Anion gap: 12 (ref 5–15)
BUN: 12 mg/dL (ref 6–20)
CO2: 22 mmol/L (ref 22–32)
Calcium: 10 mg/dL (ref 8.9–10.3)
Chloride: 108 mmol/L (ref 98–111)
Creatinine, Ser: 0.46 mg/dL (ref 0.44–1.00)
GFR, Estimated: >60 mL/min (ref >60)
Glucose, Bld: 97 mg/dL (ref 70–99)
Potassium: 3.7 mmol/L (ref 3.5–5.1)
Sodium: 142 mmol/L (ref 135–145)
Total Bilirubin: 0.9 mg/dL (ref 0.3–1.2)
Total Protein: 8.4 g/dL — ABNORMAL HIGH (ref 6.5–8.1)

## 2023-05-08 LAB — LIPASE, BLOOD: Lipase: 48 U/L (ref 11–51)

## 2023-05-08 LAB — ETHANOL: Alcohol, Ethyl (B): 106 mg/dL — ABNORMAL HIGH (ref <10)

## 2023-05-08 MED ORDER — FOLIC ACID 1 MG PO TABS
1.0000 mg | ORAL_TABLET | Freq: Every day | ORAL | Status: DC
  Administered 2023-05-08: 1 mg via ORAL
  Filled 2023-05-08: qty 1

## 2023-05-08 MED ORDER — LORAZEPAM 2 MG/ML IJ SOLN
2.0000 mg | Freq: Once | INTRAMUSCULAR | Status: AC
  Administered 2023-05-08: 2 mg via INTRAVENOUS

## 2023-05-08 MED ORDER — LORAZEPAM 1 MG PO TABS: 1.0000 mg | ORAL_TABLET | ORAL | Status: DC | PRN

## 2023-05-08 MED ORDER — ADULT MULTIVITAMIN W/MINERALS CH
1.0000 | ORAL_TABLET | Freq: Every day | ORAL | Status: DC
  Administered 2023-05-08: 1 via ORAL
  Filled 2023-05-08: qty 1

## 2023-05-08 MED ORDER — ONDANSETRON HCL 4 MG/2ML IJ SOLN
4.0000 mg | Freq: Once | INTRAMUSCULAR | Status: AC
  Administered 2023-05-08: 4 mg via INTRAVENOUS
  Filled 2023-05-08: qty 2

## 2023-05-08 MED ORDER — SODIUM CHLORIDE 0.9 % IV BOLUS
1000.0000 mL | Freq: Once | INTRAVENOUS | Status: AC
  Administered 2023-05-08: 1000 mL via INTRAVENOUS

## 2023-05-08 MED ORDER — PROCHLORPERAZINE EDISYLATE 10 MG/2ML IJ SOLN
10.0000 mg | Freq: Once | INTRAMUSCULAR | Status: AC
  Administered 2023-05-08: 10 mg via INTRAVENOUS
  Filled 2023-05-08: qty 2

## 2023-05-08 MED ORDER — THIAMINE HCL 100 MG/ML IJ SOLN
100.0000 mg | Freq: Every day | INTRAMUSCULAR | Status: DC
  Filled 2023-05-08: qty 2

## 2023-05-08 MED ORDER — LORAZEPAM 2 MG/ML IJ SOLN
1.0000 mg | INTRAMUSCULAR | Status: DC | PRN
  Filled 2023-05-08: qty 1

## 2023-05-08 MED ORDER — DIPHENHYDRAMINE HCL 50 MG/ML IJ SOLN
12.5000 mg | Freq: Once | INTRAMUSCULAR | Status: AC
  Administered 2023-05-08: 12.5 mg via INTRAVENOUS
  Filled 2023-05-08: qty 1

## 2023-05-08 MED ORDER — SODIUM CHLORIDE 0.9 % IV BOLUS: 500.0000 mL | Freq: Once | INTRAVENOUS | Status: DC

## 2023-05-08 MED ORDER — THIAMINE MONONITRATE 100 MG PO TABS
100.0000 mg | ORAL_TABLET | Freq: Every day | ORAL | Status: DC
  Administered 2023-05-08: 100 mg via ORAL
  Filled 2023-05-08: qty 1

## 2023-05-08 MED ORDER — ONDANSETRON HCL 4 MG PO TABS: 4.0000 mg | ORAL_TABLET | Freq: Four times a day (QID) | ORAL | 0 refills | Status: DC

## 2023-05-08 NOTE — ED Notes (Incomplete)
Pt walked to bathroom 

## 2023-05-08 NOTE — Discharge Instructions (Signed)
Evaluation today revealed that he likely had nausea vomiting caused by possible adverse reaction from being on naltrexone and drinking alcohol at the same time. Advised that you do not drink alcohol especially if you are on a drink some as it will make you vomit. Also during encounter, you likely went into withdrawal, but after treatment feel that you are safe to go home. If you have a seizure, lose consciousness, uncontrolled sweating, fever or any other concerning symptom please return emerged part for further evaluation.

## 2023-05-08 NOTE — ED Triage Notes (Signed)
BIB [facility] for generalized body pains. Pt states she has history of alcohol abuse and has been taking a medication that is supposed to help stay away from alcohol. Pt states she did take her regular meds this am, then had a beer this am. Also has about 3 last night

## 2023-05-08 NOTE — ED Notes (Signed)
Pt went to the bathroom to urinate and pt was not able to pee. Pt stated she will try again later on.

## 2023-05-08 NOTE — ED Notes (Signed)
Pt unable to provide urine sample at this time. Pt assisted back to bed.

## 2023-05-08 NOTE — ED Provider Notes (Signed)
[place] EMERGENCY DEPARTMENT AT [facility] Provider Note   CSN: [id number] Arrival date & time: 05/08/23  1112     History  No chief complaint on file.  HPI Heidi [name] is a 52 y.o. female with history of alcohol abuse presenting for nausea vomiting diarrhea.  Started this morning.  States she drank a soda and had 1 beer and then started to vomit followed by diarrhea.  Output is nonbloody.  Denies urinary changes.  Denies fever or chills.  Denies abdominal pain.  Denies sick contacts.  States she did drink 3 beers last night as well.  Patient states she is trying to quit her drinking.  Was started on naltrexone about 2-1/2 weeks ago with plans to taper off Librium.  Denies marijuana use.  HPI     Home Medications Prior to Admission medications   Medication Sig Start Date End Date Taking? Authorizing Provider  ondansetron (ZOFRAN) 4 MG tablet Take 1 tablet (4 mg total) by mouth every 6 (six) hours. 05/08/23  Yes Gareth Eagle, PA-C  QUEtiapine (SEROQUEL) 25 MG tablet Take 1 tablet (25 mg total) by mouth at bedtime. 04/15/23  Yes Vassie Loll, MD  chlordiazePOXIDE (LIBRIUM) 5 MG capsule Take 2 tablet by mouth 3 times a day x 2 days; then 1 tablet by mouth 3 times a day x 2 days; then 1 tablet by mouth twice a day x 2 days; then 1 tablet by mouth daily x 3 days and stop Librium. Patient not taking: Reported on 05/08/2023 04/15/23   Vassie Loll, MD  cyanocobalamin (VITAMIN B12) 1000 MCG tablet Take 1 tablet (1,000 mcg total) by mouth daily. 04/15/23   Vassie Loll, MD  cyclobenzaprine (FLEXERIL) 5 MG tablet Take 1 tablet (5 mg total) by mouth 3 (three) times daily as needed for muscle spasms. 04/26/23   Del Nigel Berthold, FNP  diltiazem (CARDIZEM CD) 120 MG 24 hr capsule Take 1 capsule (120 mg total) by mouth daily. 04/16/23   Vassie Loll, MD  folic acid (FOLVITE) 1 MG tablet Take 1 tablet (1 mg total) by mouth daily. 10/21/22   Cleora Fleet, MD  Multiple  Vitamin (MULTIVITAMIN WITH MINERALS) TABS tablet Take 1 tablet by mouth daily. 10/21/22   Johnson, Clanford L, MD  naltrexone (DEPADE) 50 MG tablet Take 1 tablet (50 mg total) by mouth daily. Patient not taking: Reported on 05/08/2023 04/19/23   Gardenia Phlegm, MD  nicotine (NICODERM CQ - DOSED IN MG/24 HOURS) 14 mg/24hr patch Place 1 patch (14 mg total) onto the skin daily. 04/16/23   Vassie Loll, MD  pantoprazole (PROTONIX) 40 MG tablet Take 1 tablet (40 mg total) by mouth daily. 04/15/23 04/14/24  Vassie Loll, MD  thiamine (VITAMIN B-1) 100 MG tablet Take 1 tablet (100 mg total) by mouth daily. 10/21/22   Cleora Fleet, MD      Allergies    Patient has no known allergies.    Review of Systems   See HPI for pertinent positives   Physical Exam   Vitals:   05/08/23 1621 05/08/23 1630  BP: (!) 157/108 (!) 152/105  Pulse: (!) 114 (!) 102  Resp:  (!) 25  Temp:    SpO2:  98%    CONSTITUTIONAL:  well-appearing, NAD NEURO:  Alert and oriented x 3, CN 3-12 grossly intact EYES:  eyes equal and reactive ENT/NECK:  Supple, no stridor  CARDIO:  Regular rate and rhythm, appears well-perfused  PULM:  No respiratory  distress, CTAB GI/GU:  non-distended, soft, non tender MSK/SPINE:  No gross deformities, no edema, moves all extremities  SKIN:  no rash, atraumatic  *Additional and/or pertinent findings included in MDM below   ED Results / Procedures / Treatments   Labs (all labs ordered are listed, but only abnormal results are displayed) Labs Reviewed  CBC WITH DIFFERENTIAL/PLATELET - Abnormal; Notable for the following components:      Result Value   Platelets 100 (*)    All other components within normal limits  COMPREHENSIVE METABOLIC PANEL - Abnormal; Notable for the following components:   Total Protein 8.4 (*)    All other components within normal limits  ETHANOL - Abnormal; Notable for the following components:   Alcohol, Ethyl (B) 106 (*)    All other components  within normal limits  LIPASE, BLOOD  URINALYSIS, ROUTINE W REFLEX MICROSCOPIC  PREGNANCY, URINE    EKG EKG Interpretation  Date/Time:  Saturday May 08 2023 11:22:18 EDT Ventricular Rate:  93 PR Interval:  154 QRS Duration: 79 QT Interval:  384 QTC Calculation: 478 R Axis:   85 Text Interpretation: Sinus rhythm slower rate and improved t waves from prior 4/24 Confirmed by Meridee Score [phone]) on 05/08/2023 11:26:14 AM  Radiology No results found.  Procedures Procedures    Medications Ordered in ED Medications  sodium chloride 0.9 % bolus 500 mL (has no administration in time range)  LORazepam (ATIVAN) tablet 1-4 mg (has no administration in time range)    Or  LORazepam (ATIVAN) injection 1-4 mg (has no administration in time range)  thiamine (VITAMIN B1) tablet 100 mg (100 mg Oral Given 05/08/23 1557)    Or  thiamine (VITAMIN B1) injection 100 mg ( Intravenous See Alternative 05/08/23 1557)  folic acid (FOLVITE) tablet 1 mg (1 mg Oral Given 05/08/23 1556)  multivitamin with minerals tablet 1 tablet (1 tablet Oral Given 05/08/23 1556)  sodium chloride 0.9 % bolus 1,000 mL (1,000 mLs Intravenous New Bag/Given 05/08/23 1506)  ondansetron (ZOFRAN) injection 4 mg (4 mg Intravenous Given 05/08/23 1311)  ondansetron (ZOFRAN) injection 4 mg (4 mg Intravenous Given 05/08/23 1515)  sodium chloride 0.9 % bolus 1,000 mL (1,000 mLs Intravenous New Bag/Given 05/08/23 1702)  LORazepam (ATIVAN) injection 2 mg (2 mg Intravenous Given 05/08/23 1704)  prochlorperazine (COMPAZINE) injection 10 mg (10 mg Intravenous Given 05/08/23 1709)  diphenhydrAMINE (BENADRYL) injection 12.5 mg (12.5 mg Intravenous Given 05/08/23 1707)    ED Course/ Medical Decision Making/ A&P Clinical Course as of 05/08/23 1836  Sat May 08, 2023  6459 52 year old female with significant alcohol history here with intractable nausea and vomiting.  Lab work showing low platelets and elevated alcohol level, normal LFTs.  Continue  to give fluids and other symptomatic treatment.  Disposition per response to treatment. [MB]  1730 After Compazine, Benadryl and Ativan and a second liter of normal saline, patient states that nausea is improving, heart rate is also improving, remains hypertensive.  Hands also remain tremulous but improved. [JR]    Clinical Course User Index [JR] Gareth Eagle, PA-C [MB] Terrilee Files, MD                             Medical Decision Making Amount and/or Complexity of Data Reviewed Labs: ordered.  Risk OTC drugs. Prescription drug management.   Initial Impression and Ddx 52 year old well-appearing female presenting for nausea, vomiting diarrhea. Patient PMH that increases complexity of ED encounter:  alcohol abuse  Interpretation of Diagnostics - I independent reviewed and interpreted the labs as followed: Thrombocytopenia, elevated ethanol  -I personally reviewed and interpreted EKG which revealed sinus rhythm  Patient Reassessment and Ultimate Disposition/Management After treatment, patient stated she was improved overall but noted worsening tachycardia and hypertension, also hands tremulous with tongue fasciculations raising concern for possible alcohol withdrawal. Placed on CIWA protocol and treated with Ativan and, another dose of Zofran, Benadryl and Compazine and volume resuscitated with normal saline.  Initial CIWA > 20. After treatment, patient states she felt much better. Walked patient around the unit without complication. Fluid challenged without complication. Heart rate and blood pressure improved. Symptoms initially concerning for emesis secondary to naltrexone use with alcohol consumption versus viral GI illness but after couple hours of observation likely started to withdrawal. Reassessed a third time and patient states she felt much better and was ready to go home.  Vital stable discharge. Advise follow-up PCP.  Discussed return precautions.  Sent home with Zofran.   Discharged home.  Patient management required discussion with the following services or consulting groups:  None  Complexity of Problems Addressed Acute complicated illness or Injury  Additional Data Reviewed and Analyzed Further history obtained from: Further history from spouse/family member, Past medical history and medications listed in the EMR, and Prior ED visit notes  Patient Encounter Risk Assessment Prescriptions         Final Clinical Impression(s) / ED Diagnoses Final diagnoses:  Nausea and vomiting, unspecified vomiting type  Alcohol withdrawal syndrome without complication (HCC)    Rx / DC Orders ED Discharge Orders          Ordered    ondansetron (ZOFRAN) 4 MG tablet  Every 6 hours        05/08/23 1836              Gareth Eagle, PA-C 05/08/23 1836    Terrilee Files, MD 05/09/23 [phone]

## 2023-05-11 ENCOUNTER — Telehealth: Payer: Self-pay

## 2023-05-11 NOTE — Transitions of Care (Post Inpatient/ED Visit) (Signed)
   05/11/2023  Name: Heidi [name] MRN: [id number] DOB: Mar 16, 1971  Today's TOC FU Call Status: Today's TOC FU Call Status:: Unsuccessul Call (1st Attempt) Unsuccessful Call (1st Attempt) Date: 05/11/23  Attempted to reach the patient regarding the most recent Inpatient/ED visit.  Follow Up Plan: Additional outreach attempts will be made to reach the patient to complete the Transitions of Care (Post Inpatient/ED visit) call.   Signature Agnes Lawrence, CMA (AAMA)  [facility] [phone]

## 2023-05-12 ENCOUNTER — Telehealth (INDEPENDENT_AMBULATORY_CARE_PROVIDER_SITE_OTHER): Payer: Medicaid Other | Admitting: Professional Counselor

## 2023-05-12 DIAGNOSIS — F1090 Alcohol use, unspecified, uncomplicated: Secondary | ICD-10-CM

## 2023-05-12 NOTE — BH Specialist Note (Addendum)
Behavioral Health Treatment Plan Team Note  MRN: [id number] NAME: Heidi [name]  DATE: 05/12/23  Start time: 3:05  End time:  3;25 Total time:  25 min Documentation Time: 30 min Total collaborative care time: 55 min Total number of Virtual BH Treatment Team Plan encounters: 1/4  Treatment Team Attendees: Dr. Vanetta Shawl and Esmond Harps  Diagnoses:    ICD-10-CM   1. Alcohol use disorder  F10.90      Goals, Interventions and Follow-up Plan Goals: Decrease Alcohol Use Interventions: Motivational Interviewing Medication Management Recommendations: NA Follow-up Plan: Refer to [facility] Recommendation Referral to [facility] and/or psychiatry for alcohol use disorder Consider starting thiamine repletion Monitor thrombocytopenia  History of the present illness Presenting Problem/Current Symptoms:  Patient is a 52 y/o female with a history of alcohol use disorder and cervical radiculopathy who presents to collaborative care after recently establishing care with her PCP. Patient was seen for follow-up after an ED visit for alcohol withdraw andd potential alcohol induced seizure. Patient resumed alcohol use and returned to PCP stating she is trying to "cut back". PCP prescribed librium taper and recommended weaning off alcohol slowly then starting naltrexone. Patient presented today reporting she is still drinking and she is unsure about if medication is working. Patient reports drinking a few times a week having 2-3 drinking during those times. It was unclear if she was taking the liberum as prescribed as evidenced by patient stating "I can't drink as much with my medication". Patient is in the contemplation stage of change she presents as ambivalent. She admits the severity of her alcohol use but is unwilling to seek inpatient treatment. Patient reports "I dont want to be locked away I can't miss my sons graduation". [facility] explains that detox and inpatient treatment is  voluntary and that it is the best option for safe detoxification and treatment for alcohol use disorder. She declined the recommendation to go to inpatient substance abuse treatment. Patient reports wanting a better life explaining that she doesn't like feeling like she does. Patient lost her husband 3 years ago and had to move into her mothers house. She has not been able to gain employment for several years. Reports having a supportive family but lacks knowledge and skills to make necessary changes to improve functioning. Patient denies SI/Hi.     Screenings PHQ-9 Assessments:     04/26/2023   10:02 AM 04/19/2023    3:35 PM  Depression screen PHQ 2/9  Decreased Interest 3 1  Down, Depressed, Hopeless 2 2  PHQ - 2 Score 5 3  Altered sleeping 3 1  Tired, decreased energy 3 1  Change in appetite 3 1  Feeling bad or failure about yourself  1 1  Trouble concentrating 3 2  Moving slowly or fidgety/restless 2 0  Suicidal thoughts 0 0  PHQ-9 Score 20 9  Difficult doing work/chores Extremely dIfficult Somewhat difficult   GAD-7 Assessments:     04/26/2023   10:02 AM  GAD 7 : Generalized Anxiety Score  Nervous, Anxious, on Edge 2  Control/stop worrying 3  Worry too much - different things 3  Trouble relaxing 3  Restless 1  Easily annoyed or irritable 3  Afraid - awful might happen 1  Total GAD 7 Score 16  Anxiety Difficulty Extremely difficult    Past Medical History Past Medical History:  Diagnosis Date   Alcohol withdrawal (HCC)    Cervical radiculopathy     Vital signs: There were no vitals  filed for this visit.  Allergies:  Allergies as of 05/12/2023   (No Known Allergies)    Medication History Current medications:  Outpatient Encounter Medications as of 05/12/2023  Medication Sig   chlordiazePOXIDE (LIBRIUM) 5 MG capsule Take 2 tablet by mouth 3 times a day x 2 days; then 1 tablet by mouth 3 times a day x 2 days; then 1 tablet by mouth twice a day x 2 days; then 1  tablet by mouth daily x 3 days and stop Librium. (Patient not taking: Reported on 05/08/2023)   cyanocobalamin (VITAMIN B12) 1000 MCG tablet Take 1 tablet (1,000 mcg total) by mouth daily.   cyclobenzaprine (FLEXERIL) 5 MG tablet Take 1 tablet (5 mg total) by mouth 3 (three) times daily as needed for muscle spasms.   diltiazem (CARDIZEM CD) 120 MG 24 hr capsule Take 1 capsule (120 mg total) by mouth daily.   folic acid (FOLVITE) 1 MG tablet Take 1 tablet (1 mg total) by mouth daily.   Multiple Vitamin (MULTIVITAMIN WITH MINERALS) TABS tablet Take 1 tablet by mouth daily.   naltrexone (DEPADE) 50 MG tablet Take 1 tablet (50 mg total) by mouth daily. (Patient not taking: Reported on 05/08/2023)   nicotine (NICODERM CQ - DOSED IN MG/24 HOURS) 14 mg/24hr patch Place 1 patch (14 mg total) onto the skin daily.   ondansetron (ZOFRAN) 4 MG tablet Take 1 tablet (4 mg total) by mouth every 6 (six) hours.   pantoprazole (PROTONIX) 40 MG tablet Take 1 tablet (40 mg total) by mouth daily.   QUEtiapine (SEROQUEL) 25 MG tablet Take 1 tablet (25 mg total) by mouth at bedtime.   thiamine (VITAMIN B-1) 100 MG tablet Take 1 tablet (100 mg total) by mouth daily.   No facility-administered encounter medications on file as of 05/12/2023.     Scribe for Treatment Team: Reuel Boom

## 2023-05-18 ENCOUNTER — Encounter: Payer: Self-pay | Admitting: Family Medicine

## 2023-05-18 ENCOUNTER — Other Ambulatory Visit: Payer: Self-pay | Admitting: Family Medicine

## 2023-05-18 ENCOUNTER — Ambulatory Visit (INDEPENDENT_AMBULATORY_CARE_PROVIDER_SITE_OTHER): Payer: Medicaid Other | Admitting: Family Medicine

## 2023-05-18 VITALS — BP 142/94 | HR 87 | Ht 63.0 in | Wt 140.0 lb

## 2023-05-18 DIAGNOSIS — G8929 Other chronic pain: Secondary | ICD-10-CM

## 2023-05-18 DIAGNOSIS — F1090 Alcohol use, unspecified, uncomplicated: Secondary | ICD-10-CM

## 2023-05-18 DIAGNOSIS — M25562 Pain in left knee: Secondary | ICD-10-CM

## 2023-05-18 DIAGNOSIS — E519 Thiamine deficiency, unspecified: Secondary | ICD-10-CM

## 2023-05-18 DIAGNOSIS — M5441 Lumbago with sciatica, right side: Secondary | ICD-10-CM

## 2023-05-18 DIAGNOSIS — M25561 Pain in right knee: Secondary | ICD-10-CM | POA: Diagnosis not present

## 2023-05-18 DIAGNOSIS — M5442 Lumbago with sciatica, left side: Secondary | ICD-10-CM | POA: Diagnosis not present

## 2023-05-18 MED ORDER — NALTREXONE HCL 50 MG PO TABS
50.0000 mg | ORAL_TABLET | Freq: Every day | ORAL | 1 refills | Status: DC
Start: 2023-05-18 — End: 2023-07-24

## 2023-05-18 MED ORDER — CYCLOBENZAPRINE HCL 5 MG PO TABS
5.0000 mg | ORAL_TABLET | Freq: Three times a day (TID) | ORAL | 0 refills | Status: DC | PRN
Start: 2023-05-18 — End: 2023-06-27

## 2023-05-18 MED ORDER — ONDANSETRON HCL 4 MG PO TABS: 4.0000 mg | ORAL_TABLET | Freq: Four times a day (QID) | ORAL | 0 refills | Status: DC

## 2023-05-18 MED ORDER — VITAMIN B-1 100 MG PO TABS: 100.0000 mg | ORAL_TABLET | Freq: Every day | ORAL | 2 refills | Status: DC

## 2023-05-18 NOTE — Patient Instructions (Signed)

## 2023-05-18 NOTE — Progress Notes (Signed)
Patient Office Visit   Subjective   Patient ID: Heidi [name], female    DOB: Nov 11, 1971  Age: 52 y.o. MRN: [id number]  CC:  Chief Complaint  Patient presents with   Follow-up    Patient is here for ED f/u. Was in on 5/18 for nausea, vomiting and ETOH withdrawal.     HPI Heidi [name] 52 year old female presents to clinic for back pain and ER follow up for ETOH withdrawal. She  has a past medical history of Alcohol withdrawal (HCC) and Cervical radiculopathy.  Back Pain This is a chronic problem. The problem occurs constantly. The problem has been gradually worsening since onset. The pain is present in the gluteal and lumbar spine. The quality of the pain is described as aching, burning and cramping. The pain is at a severity of 10/10. The pain is severe. The pain is The same all the time. The symptoms are aggravated by bending, lying down, position and sitting. Stiffness is present All day. Associated symptoms include leg pain, numbness and tingling. Pertinent negatives include no abdominal pain, bladder incontinence, bowel incontinence, chest pain, dysuria, fever, headaches, paresis or paresthesias. Risk factors include poor posture, sedentary lifestyle and lack of exercise. She has tried heat, ice and analgesics for the symptoms. The treatment provided no relief.      Outpatient Encounter Medications as of 05/18/2023  Medication Sig   chlordiazePOXIDE (LIBRIUM) 5 MG capsule Take 2 tablet by mouth 3 times a day x 2 days; then 1 tablet by mouth 3 times a day x 2 days; then 1 tablet by mouth twice a day x 2 days; then 1 tablet by mouth daily x 3 days and stop Librium.   cyanocobalamin (VITAMIN B12) 1000 MCG tablet Take 1 tablet (1,000 mcg total) by mouth daily.   diltiazem (CARDIZEM CD) 120 MG 24 hr capsule Take 1 capsule (120 mg total) by mouth daily.   folic acid (FOLVITE) 1 MG tablet Take 1 tablet (1 mg total) by mouth daily.   Multiple Vitamin (MULTIVITAMIN WITH MINERALS) TABS  tablet Take 1 tablet by mouth daily.   nicotine (NICODERM CQ - DOSED IN MG/24 HOURS) 14 mg/24hr patch Place 1 patch (14 mg total) onto the skin daily.   pantoprazole (PROTONIX) 40 MG tablet Take 1 tablet (40 mg total) by mouth daily.   QUEtiapine (SEROQUEL) 25 MG tablet Take 1 tablet (25 mg total) by mouth at bedtime.   [DISCONTINUED] cyclobenzaprine (FLEXERIL) 5 MG tablet Take 1 tablet (5 mg total) by mouth 3 (three) times daily as needed for muscle spasms.   [DISCONTINUED] naltrexone (DEPADE) 50 MG tablet Take 1 tablet (50 mg total) by mouth daily.   [DISCONTINUED] ondansetron (ZOFRAN) 4 MG tablet Take 1 tablet (4 mg total) by mouth every 6 (six) hours.   [DISCONTINUED] thiamine (VITAMIN B-1) 100 MG tablet Take 1 tablet (100 mg total) by mouth daily.   cyclobenzaprine (FLEXERIL) 5 MG tablet Take 1 tablet (5 mg total) by mouth 3 (three) times daily as needed for muscle spasms.   naltrexone (DEPADE) 50 MG tablet Take 1 tablet (50 mg total) by mouth daily.   ondansetron (ZOFRAN) 4 MG tablet Take 1 tablet (4 mg total) by mouth every 6 (six) hours.   thiamine (VITAMIN B-1) 100 MG tablet Take 1 tablet (100 mg total) by mouth daily.   No facility-administered encounter medications on file as of 05/18/2023.    No past surgical history on file.  Review of Systems  Constitutional:  Negative for chills and fever.  Eyes:  Negative for blurred vision.  Respiratory:  Negative for shortness of breath.   Cardiovascular:  Negative for chest pain.  Gastrointestinal:  Negative for abdominal pain and bowel incontinence.  Genitourinary:  Negative for bladder incontinence and dysuria.  Musculoskeletal:  Positive for back pain.  Neurological:  Positive for tingling and numbness. Negative for dizziness, headaches and paresthesias.      Objective    BP (!) 142/94   Pulse 87   Ht 5\' 3"  (1.6 m)   Wt 140 lb (63.5 kg)   SpO2 98%   BMI 24.80 kg/m   Physical Exam Vitals reviewed.  Constitutional:       General: She is not in acute distress.    Appearance: Normal appearance. She is not ill-appearing, toxic-appearing or diaphoretic.  HENT:     Head: Normocephalic.  Eyes:     General:        Right eye: No discharge.        Left eye: No discharge.     Conjunctiva/sclera: Conjunctivae normal.  Cardiovascular:     Rate and Rhythm: Normal rate.     Pulses: Normal pulses.     Heart sounds: Normal heart sounds.  Pulmonary:     Effort: Pulmonary effort is normal. No respiratory distress.     Breath sounds: Normal breath sounds.  Abdominal:     General: Bowel sounds are normal.     Palpations: Abdomen is soft.     Tenderness: There is no abdominal tenderness. There is no right CVA tenderness, left CVA tenderness or guarding.  Musculoskeletal:     Cervical back: Normal range of motion.     Lumbar back: Tenderness present. Decreased range of motion. Positive right straight leg raise test and positive left straight leg raise test.  Skin:    General: Skin is warm and dry.     Capillary Refill: Capillary refill takes less than 2 seconds.  Neurological:     General: No focal deficit present.     Mental Status: She is alert and oriented to person, place, and time.     Coordination: Coordination normal.     Gait: Gait normal.  Psychiatric:        Mood and Affect: Mood normal.        Behavior: Behavior normal.       Assessment & Plan:  Alcohol use disorder Assessment & Plan: BMP, CBC, Vitamin B1 labs ordered. Counseled on the importance of drinking cessation Information provided for Substance Abuse Facility treatment locator Website Naltrexone 50 mg daily to help with cravings for alcohol use Advise avoid purchasing alcohol, and remove alcohol from home.   Orders: -     Basic metabolic panel -     Vitamin B1 -     CBC -     Naltrexone HCl; Take 1 tablet (50 mg total) by mouth daily.  Dispense: 30 tablet; Refill: 1  Chronic midline low back pain with bilateral sciatica Assessment &  Plan: XRAY Ordered- awaiting results will follow up Flexeril 5 mg PRN Discussed medication desired effects, potential side effects. Non pharmacological interventions include rest, avoid twisting, improper bending, straining lower back. Demonstration of proper body mechanics. Alternate ice and heat. Recommend stretching back and legs. Follow up for worsening or persistent symptoms. Patient verbalizes understanding regarding plan of care and all questions answered.   Orders: -     DG Lumbar Spine 2-3 Views; Future  Chronic pain of  both knees -     Cyclobenzaprine HCl; Take 1 tablet (5 mg total) by mouth 3 (three) times daily as needed for muscle spasms.  Dispense: 30 tablet; Refill: 0  Other orders -     Ondansetron HCl; Take 1 tablet (4 mg total) by mouth every 6 (six) hours.  Dispense: 12 tablet; Refill: 0 -     Vitamin B-1; Take 1 tablet (100 mg total) by mouth daily.  Dispense: 30 tablet; Refill: 2    Return in about 3 months (around 08/18/2023) for chronic follow-up.   Cruzita Lederer Newman Nip, FNP

## 2023-05-18 NOTE — Assessment & Plan Note (Signed)
XRAY Ordered- awaiting results will follow up Flexeril 5 mg PRN Discussed medication desired effects, potential side effects. Non pharmacological interventions include rest, avoid twisting, improper bending, straining lower back. Demonstration of proper body mechanics. Alternate ice and heat. Recommend stretching back and legs. Follow up for worsening or persistent symptoms. Patient verbalizes understanding regarding plan of care and all questions answered.

## 2023-05-18 NOTE — Assessment & Plan Note (Addendum)
BMP, CBC, Vitamin B1 labs ordered. Counseled on the importance of drinking cessation Information provided for Substance Abuse Facility treatment locator Website Naltrexone 50 mg daily to help with cravings for alcohol use Advise avoid purchasing alcohol, and remove alcohol from home.

## 2023-05-19 LAB — BASIC METABOLIC PANEL
BUN: 8 mg/dL (ref 6–24)
CO2: 20 mmol/L (ref 20–29)
Calcium: 10.2 mg/dL (ref 8.7–10.2)
Creatinine, Ser: 0.67 mg/dL (ref 0.57–1.00)
Glucose: 80 mg/dL (ref 70–99)

## 2023-05-19 LAB — CBC: RDW: 14.1 % (ref 11.7–15.4)

## 2023-05-20 ENCOUNTER — Other Ambulatory Visit: Payer: Self-pay | Admitting: Family Medicine

## 2023-05-20 ENCOUNTER — Ambulatory Visit (HOSPITAL_COMMUNITY)
Admission: RE | Admit: 2023-05-20 | Discharge: 2023-05-20 | Disposition: A | Discharge status: Home / Self Care | Payer: Medicaid Other | Source: Ambulatory Visit | Attending: Family Medicine | Admitting: Family Medicine

## 2023-05-20 DIAGNOSIS — M5441 Lumbago with sciatica, right side: Secondary | ICD-10-CM | POA: Diagnosis not present

## 2023-05-20 DIAGNOSIS — G8929 Other chronic pain: Secondary | ICD-10-CM | POA: Diagnosis present

## 2023-05-20 DIAGNOSIS — M5442 Lumbago with sciatica, left side: Secondary | ICD-10-CM | POA: Insufficient documentation

## 2023-05-20 NOTE — Transitions of Care (Post Inpatient/ED Visit) (Signed)
   05/20/2023  Name: Heidi [name] MRN: [id number] DOB: 20-Nov-1971  Today's TOC FU Call Status: Today's TOC FU Call Status:: Unsuccessul Call (1st Attempt) Unsuccessful Call (1st Attempt) Date: 05/11/23  Attempted to reach the patient regarding the most recent Inpatient/ED visit.  Follow Up Plan: No further outreach attempts will be made at this time. We have been unable to contact the patient. Patient already seen by PCP on 05/18/23.  Signature Agnes Lawrence, CMA (AAMA)  [facility] [phone]

## 2023-05-21 ENCOUNTER — Other Ambulatory Visit: Payer: Self-pay | Admitting: Family Medicine

## 2023-05-21 DIAGNOSIS — M545 Low back pain, unspecified: Secondary | ICD-10-CM

## 2023-05-22 LAB — CBC
Hematocrit: 36.6 % (ref 34.0–46.6)
Hemoglobin: 12.7 g/dL (ref 11.1–15.9)
MCH: 31.1 pg (ref 26.6–33.0)
MCHC: 34.7 g/dL (ref 31.5–35.7)
MCV: 90 fL (ref 79–97)
Platelets: 233 10*3/uL (ref 150–450)
RBC: 4.09 x10E6/uL (ref 3.77–5.28)
WBC: 6.8 10*3/uL (ref 3.4–10.8)

## 2023-05-22 LAB — VITAMIN B1: Thiamine: 128.3 nmol/L (ref 66.5–200.0)

## 2023-05-22 LAB — BASIC METABOLIC PANEL
BUN/Creatinine Ratio: 12 (ref 9–23)
Chloride: 101 mmol/L (ref 96–106)
Potassium: 4.5 mmol/L (ref 3.5–5.2)
Sodium: 137 mmol/L (ref 134–144)
eGFR: 107 mL/min/{1.73_m2} (ref >59)

## 2023-05-27 ENCOUNTER — Ambulatory Visit (INDEPENDENT_AMBULATORY_CARE_PROVIDER_SITE_OTHER): Payer: Medicaid Other | Admitting: Family Medicine

## 2023-05-27 ENCOUNTER — Encounter: Payer: Self-pay | Admitting: Family Medicine

## 2023-05-27 VITALS — BP 158/98 | HR 86 | Ht 63.0 in | Wt 143.0 lb

## 2023-05-27 DIAGNOSIS — I10 Essential (primary) hypertension: Secondary | ICD-10-CM | POA: Diagnosis not present

## 2023-05-27 MED ORDER — OLMESARTAN MEDOXOMIL 20 MG PO TABS: 10.0000 mg | ORAL_TABLET | Freq: Every day | ORAL | 2 refills | Status: DC

## 2023-05-27 NOTE — Assessment & Plan Note (Signed)
Vitals:   05/27/23 1001 05/27/23 1002  BP: (!) 159/93 (!) 158/98   Blood pressure not controlled Patient reported taking Cardizem 120 mg daily Started olmesartan 10 mg daily Explained non pharmacological interventions such as low salt, DASH diet discussed. Educated on stress reduction and physical activity minimum 150 minutes per week. Discussed signs and symptoms of major cardiovascular event and need to present to the ED. Follow up in 6 weeks or sooner if needed. Patient verbalizes understanding regarding plan of care and all questions answered.

## 2023-05-27 NOTE — Progress Notes (Signed)
Patient Office Visit   Subjective   Patient ID: Heidi [name], female    DOB: 07-01-1971  Age: 52 y.o. MRN: [id number]  CC:  Chief Complaint  Patient presents with   Follow-up    Patient states she is here for f/u for her back.     HPI Heidi [name] 52 year old female, presents to clinic for HTN follow up. She  has a past medical history of Alcohol withdrawal (HCC) and Cervical radiculopathy.  Hypertension This is a recurrent problem. Blood pressure has been gradually worsening since onset. Blood pressure is uncontrolled. Pertinent negatives include no blurred vision, chest pain or palpitations. Risk factors for coronary artery disease include smoking/tobacco exposure, stress and sedentary lifestyle. Past treatments include lifestyle changes. The current treatment provides no improvement. Compliance problems include diet and exercise.         Outpatient Encounter Medications as of 05/27/2023  Medication Sig   chlordiazePOXIDE (LIBRIUM) 5 MG capsule Take 2 tablet by mouth 3 times a day x 2 days; then 1 tablet by mouth 3 times a day x 2 days; then 1 tablet by mouth twice a day x 2 days; then 1 tablet by mouth daily x 3 days and stop Librium.   cyanocobalamin (VITAMIN B12) 1000 MCG tablet Take 1 tablet (1,000 mcg total) by mouth daily.   cyclobenzaprine (FLEXERIL) 5 MG tablet Take 1 tablet (5 mg total) by mouth 3 (three) times daily as needed for muscle spasms.   diltiazem (CARDIZEM CD) 120 MG 24 hr capsule Take 1 capsule (120 mg total) by mouth daily.   folic acid (FOLVITE) 1 MG tablet Take 1 tablet (1 mg total) by mouth daily.   Multiple Vitamin (MULTIVITAMIN WITH MINERALS) TABS tablet Take 1 tablet by mouth daily.   naltrexone (DEPADE) 50 MG tablet Take 1 tablet (50 mg total) by mouth daily.   nicotine (NICODERM CQ - DOSED IN MG/24 HOURS) 14 mg/24hr patch Place 1 patch (14 mg total) onto the skin daily.   olmesartan (BENICAR) 20 MG tablet Take 0.5 tablets (10 mg total) by mouth  daily.   ondansetron (ZOFRAN) 4 MG tablet Take 1 tablet (4 mg total) by mouth every 6 (six) hours.   pantoprazole (PROTONIX) 40 MG tablet Take 1 tablet (40 mg total) by mouth daily.   QUEtiapine (SEROQUEL) 25 MG tablet Take 1 tablet (25 mg total) by mouth at bedtime.   thiamine (VITAMIN B-1) 100 MG tablet Take 1 tablet (100 mg total) by mouth daily.   No facility-administered encounter medications on file as of 05/27/2023.    No past surgical history on file.  Review of Systems  Constitutional:  Negative for chills and fever.  Eyes:  Negative for blurred vision.  Respiratory:  Negative for shortness of breath.   Cardiovascular:  Negative for chest pain.  Gastrointestinal:  Negative for abdominal pain.  Genitourinary:  Negative for dysuria.  Neurological:  Negative for dizziness and headaches.      Objective    BP (!) 158/98   Pulse 86   Ht 5\' 3"  (1.6 m)   Wt 143 lb (64.9 kg)   SpO2 97%   BMI 25.33 kg/m   Physical Exam Vitals reviewed.  Constitutional:      General: She is not in acute distress.    Appearance: Normal appearance. She is not ill-appearing, toxic-appearing or diaphoretic.  HENT:     Head: Normocephalic.  Eyes:     General:  Right eye: No discharge.        Left eye: No discharge.     Conjunctiva/sclera: Conjunctivae normal.  Cardiovascular:     Rate and Rhythm: Normal rate.     Pulses: Normal pulses.     Heart sounds: Normal heart sounds.  Pulmonary:     Effort: Pulmonary effort is normal. No respiratory distress.     Breath sounds: Normal breath sounds.  Musculoskeletal:        General: Normal range of motion.     Cervical back: Normal range of motion.  Skin:    General: Skin is warm and dry.     Capillary Refill: Capillary refill takes less than 2 seconds.  Neurological:     General: No focal deficit present.     Mental Status: She is alert and oriented to person, place, and time.     Coordination: Coordination normal.     Gait: Gait  normal.  Psychiatric:        Mood and Affect: Mood normal.        Behavior: Behavior normal.       Assessment & Plan:  Primary hypertension Assessment & Plan: Vitals:   05/27/23 1001 05/27/23 1002  BP: (!) 159/93 (!) 158/98   Blood pressure not controlled Patient reported taking Cardizem 120 mg daily Started olmesartan 10 mg daily Explained non pharmacological interventions such as low salt, DASH diet discussed. Educated on stress reduction and physical activity minimum 150 minutes per week. Discussed signs and symptoms of major cardiovascular event and need to present to the ED. Follow up in 6 weeks or sooner if needed. Patient verbalizes understanding regarding plan of care and all questions answered.    Other orders -     Olmesartan Medoxomil; Take 0.5 tablets (10 mg total) by mouth daily.  Dispense: 30 tablet; Refill: 2    Return in about 6 weeks (around 07/08/2023) for hypertension, re-check blood pressure.   Cruzita Lederer Newman Nip, FNP

## 2023-05-27 NOTE — Patient Instructions (Signed)
        Great to see you today.   - Please take medications as prescribed. - Follow up with your primary health provider if any health concerns arises. - If symptoms worsen please contact your primary care provider and/or visit the emergency department.  

## 2023-06-02 ENCOUNTER — Telehealth: Payer: Self-pay | Admitting: Orthopedic Surgery

## 2023-06-02 NOTE — Telephone Encounter (Signed)
Returned the pt' call, unable to lvm, mailbox is full.  She was wanting to confirm her appt.

## 2023-06-03 ENCOUNTER — Telehealth: Payer: Self-pay | Admitting: Family Medicine

## 2023-06-03 NOTE — Telephone Encounter (Signed)
Patient called said she needs a out of note work she is not able to work. Can provider fax this letter to [phone] and call patient when ready.

## 2023-06-03 NOTE — Telephone Encounter (Signed)
Called patient to see why she wants a work note, no answer and unable to leave VM. Was seen last week and did not mention anything. Has to be seen for a note.

## 2023-06-04 ENCOUNTER — Ambulatory Visit (INDEPENDENT_AMBULATORY_CARE_PROVIDER_SITE_OTHER): Payer: Medicaid Other | Admitting: Orthopedic Surgery

## 2023-06-04 ENCOUNTER — Encounter: Payer: Self-pay | Admitting: Orthopedic Surgery

## 2023-06-04 VITALS — BP 139/98 | HR 116 | Ht 63.0 in | Wt 142.0 lb

## 2023-06-04 DIAGNOSIS — M5136 Other intervertebral disc degeneration, lumbar region: Secondary | ICD-10-CM

## 2023-06-04 DIAGNOSIS — M545 Low back pain, unspecified: Secondary | ICD-10-CM

## 2023-06-04 DIAGNOSIS — M4126 Other idiopathic scoliosis, lumbar region: Secondary | ICD-10-CM

## 2023-06-04 DIAGNOSIS — G8929 Other chronic pain: Secondary | ICD-10-CM | POA: Diagnosis not present

## 2023-06-04 MED ORDER — TIZANIDINE HCL 4 MG PO TABS
4.0000 mg | ORAL_TABLET | Freq: Four times a day (QID) | ORAL | 0 refills | Status: DC | PRN
Start: 2023-06-04 — End: 2023-06-27

## 2023-06-04 MED ORDER — NAPROXEN 500 MG PO TABS
500.0000 mg | ORAL_TABLET | Freq: Two times a day (BID) | ORAL | 2 refills | Status: DC
Start: 2023-06-04 — End: 2023-07-24

## 2023-06-04 NOTE — Patient Instructions (Addendum)
For pain  Place a heating pad on your back for 30 min Lie flat on your back with pillows under your knees   Take 1 extra strength tylenol every 6 hrs and your tizanidine and the naproxen  Physical therapy has been ordered for you at [facility]. They should call you to schedule, [phone] is the phone number to call, if you want to call to schedule.
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
## 2023-06-11 ENCOUNTER — Telehealth: Payer: Self-pay | Admitting: Family Medicine

## 2023-06-11 NOTE — Telephone Encounter (Signed)
[facility].  Noted  Copied Sleeved  Original in PCP box Copy front desk folder

## 2023-06-25 ENCOUNTER — Other Ambulatory Visit: Payer: Self-pay | Admitting: Family Medicine

## 2023-06-25 DIAGNOSIS — G8929 Other chronic pain: Secondary | ICD-10-CM

## 2023-07-01 ENCOUNTER — Other Ambulatory Visit: Payer: Self-pay | Admitting: Family Medicine

## 2023-07-01 DIAGNOSIS — G8929 Other chronic pain: Secondary | ICD-10-CM

## 2023-07-05 ENCOUNTER — Inpatient Hospital Stay (HOSPITAL_COMMUNITY)
Admission: EM | Admit: 2023-07-05 | Discharge: 2023-07-24 | DRG: 981 | Disposition: A | Discharge status: Home / Self Care | Payer: Medicaid Other | Attending: Internal Medicine | Admitting: Internal Medicine

## 2023-07-05 ENCOUNTER — Encounter (HOSPITAL_COMMUNITY): Payer: Self-pay | Admitting: Emergency Medicine

## 2023-07-05 ENCOUNTER — Other Ambulatory Visit: Payer: Self-pay

## 2023-07-05 DIAGNOSIS — R188 Other ascites: Secondary | ICD-10-CM | POA: Diagnosis present

## 2023-07-05 DIAGNOSIS — I4719 Other supraventricular tachycardia: Secondary | ICD-10-CM | POA: Diagnosis present

## 2023-07-05 DIAGNOSIS — Z781 Physical restraint status: Secondary | ICD-10-CM

## 2023-07-05 DIAGNOSIS — E876 Hypokalemia: Secondary | ICD-10-CM | POA: Diagnosis present

## 2023-07-05 DIAGNOSIS — S36039A Unspecified laceration of spleen, initial encounter: Secondary | ICD-10-CM | POA: Diagnosis present

## 2023-07-05 DIAGNOSIS — R680 Hypothermia, not associated with low environmental temperature: Secondary | ICD-10-CM | POA: Diagnosis present

## 2023-07-05 DIAGNOSIS — G9341 Metabolic encephalopathy: Secondary | ICD-10-CM | POA: Diagnosis not present

## 2023-07-05 DIAGNOSIS — R7989 Other specified abnormal findings of blood chemistry: Secondary | ICD-10-CM | POA: Diagnosis not present

## 2023-07-05 DIAGNOSIS — F10231 Alcohol dependence with withdrawal delirium: Principal | ICD-10-CM | POA: Diagnosis present

## 2023-07-05 DIAGNOSIS — D6959 Other secondary thrombocytopenia: Secondary | ICD-10-CM | POA: Diagnosis not present

## 2023-07-05 DIAGNOSIS — E871 Hypo-osmolality and hyponatremia: Secondary | ICD-10-CM | POA: Diagnosis not present

## 2023-07-05 DIAGNOSIS — K560 Paralytic ileus: Secondary | ICD-10-CM | POA: Diagnosis not present

## 2023-07-05 DIAGNOSIS — I10 Essential (primary) hypertension: Secondary | ICD-10-CM | POA: Diagnosis present

## 2023-07-05 DIAGNOSIS — I959 Hypotension, unspecified: Secondary | ICD-10-CM | POA: Diagnosis not present

## 2023-07-05 DIAGNOSIS — K567 Ileus, unspecified: Secondary | ICD-10-CM

## 2023-07-05 DIAGNOSIS — K56609 Unspecified intestinal obstruction, unspecified as to partial versus complete obstruction: Secondary | ICD-10-CM

## 2023-07-05 DIAGNOSIS — Z79899 Other long term (current) drug therapy: Secondary | ICD-10-CM

## 2023-07-05 DIAGNOSIS — R21 Rash and other nonspecific skin eruption: Secondary | ICD-10-CM | POA: Diagnosis present

## 2023-07-05 DIAGNOSIS — F10939 Alcohol use, unspecified with withdrawal, unspecified: Secondary | ICD-10-CM | POA: Diagnosis not present

## 2023-07-05 DIAGNOSIS — X58XXXA Exposure to other specified factors, initial encounter: Secondary | ICD-10-CM | POA: Diagnosis present

## 2023-07-05 DIAGNOSIS — K760 Fatty (change of) liver, not elsewhere classified: Secondary | ICD-10-CM | POA: Diagnosis present

## 2023-07-05 DIAGNOSIS — R112 Nausea with vomiting, unspecified: Secondary | ICD-10-CM | POA: Diagnosis not present

## 2023-07-05 DIAGNOSIS — F10931 Alcohol use, unspecified with withdrawal delirium: Secondary | ICD-10-CM | POA: Diagnosis not present

## 2023-07-05 DIAGNOSIS — F17210 Nicotine dependence, cigarettes, uncomplicated: Secondary | ICD-10-CM | POA: Diagnosis present

## 2023-07-05 DIAGNOSIS — Z823 Family history of stroke: Secondary | ICD-10-CM | POA: Diagnosis not present

## 2023-07-05 DIAGNOSIS — Y900 Blood alcohol level of less than 20 mg/100 ml: Secondary | ICD-10-CM | POA: Diagnosis present

## 2023-07-05 DIAGNOSIS — F17200 Nicotine dependence, unspecified, uncomplicated: Secondary | ICD-10-CM | POA: Diagnosis present

## 2023-07-05 DIAGNOSIS — K5939 Other megacolon: Secondary | ICD-10-CM | POA: Diagnosis not present

## 2023-07-05 DIAGNOSIS — E861 Hypovolemia: Secondary | ICD-10-CM | POA: Diagnosis not present

## 2023-07-05 DIAGNOSIS — F10932 Alcohol use, unspecified with withdrawal with perceptual disturbance: Secondary | ICD-10-CM | POA: Diagnosis not present

## 2023-07-05 DIAGNOSIS — D72829 Elevated white blood cell count, unspecified: Secondary | ICD-10-CM | POA: Diagnosis present

## 2023-07-05 DIAGNOSIS — R7401 Elevation of levels of liver transaminase levels: Secondary | ICD-10-CM | POA: Diagnosis present

## 2023-07-05 DIAGNOSIS — D696 Thrombocytopenia, unspecified: Secondary | ICD-10-CM | POA: Diagnosis not present

## 2023-07-05 DIAGNOSIS — T68XXXA Hypothermia, initial encounter: Secondary | ICD-10-CM | POA: Diagnosis not present

## 2023-07-05 HISTORY — DX: Malignant hyperthermia due to anesthesia, initial encounter: T88.3XXA

## 2023-07-05 LAB — RAPID URINE DRUG SCREEN, HOSP PERFORMED
Amphetamines: NOT DETECTED
Barbiturates: NOT DETECTED
Benzodiazepines: POSITIVE — AB
Cocaine: NOT DETECTED
Opiates: NOT DETECTED
Tetrahydrocannabinol: NOT DETECTED

## 2023-07-05 LAB — CBC
HCT: 35.5 % — ABNORMAL LOW (ref 36.0–46.0)
Hemoglobin: 12 g/dL (ref 12.0–15.0)
MCH: 30.5 pg (ref 26.0–34.0)
MCHC: 33.8 g/dL (ref 30.0–36.0)
MCV: 90.1 fL (ref 80.0–100.0)
Platelets: 73 10*3/uL — ABNORMAL LOW (ref 150–400)
RBC: 3.94 MIL/uL (ref 3.87–5.11)
RDW: 14.8 % (ref 11.5–15.5)
WBC: 5.3 10*3/uL (ref 4.0–10.5)
nRBC: 0 % (ref 0.0–0.2)

## 2023-07-05 LAB — URINALYSIS, ROUTINE W REFLEX MICROSCOPIC
Bilirubin Urine: NEGATIVE
Glucose, UA: NEGATIVE mg/dL
Ketones, ur: 20 mg/dL — AB
Leukocytes,Ua: NEGATIVE
Nitrite: NEGATIVE
Protein, ur: 100 mg/dL — AB
Specific Gravity, Urine: 1.027 (ref 1.005–1.030)
pH: 5 (ref 5.0–8.0)

## 2023-07-05 LAB — ETHANOL: Alcohol, Ethyl (B): <10 mg/dL (ref <10)

## 2023-07-05 LAB — HCG, SERUM, QUALITATIVE: Preg, Serum: NEGATIVE

## 2023-07-05 LAB — COMPREHENSIVE METABOLIC PANEL
ALT: 77 U/L — ABNORMAL HIGH (ref 0–44)
AST: 104 U/L — ABNORMAL HIGH (ref 15–41)
Albumin: 4.6 g/dL (ref 3.5–5.0)
Alkaline Phosphatase: 96 U/L (ref 38–126)
Anion gap: 14 (ref 5–15)
BUN: 11 mg/dL (ref 6–20)
CO2: 18 mmol/L — ABNORMAL LOW (ref 22–32)
Calcium: 10 mg/dL (ref 8.9–10.3)
Chloride: 109 mmol/L (ref 98–111)
Creatinine, Ser: 0.66 mg/dL (ref 0.44–1.00)
GFR, Estimated: >60 mL/min (ref >60)
Glucose, Bld: 103 mg/dL — ABNORMAL HIGH (ref 70–99)
Potassium: 3.4 mmol/L — ABNORMAL LOW (ref 3.5–5.1)
Sodium: 141 mmol/L (ref 135–145)
Total Bilirubin: 1.6 mg/dL — ABNORMAL HIGH (ref 0.3–1.2)
Total Protein: 8.1 g/dL (ref 6.5–8.1)

## 2023-07-05 MED ORDER — LABETALOL HCL 5 MG/ML IV SOLN
5.0000 mg | Freq: Four times a day (QID) | INTRAVENOUS | Status: DC | PRN
  Administered 2023-07-06 – 2023-07-07 (×4): 5 mg via INTRAVENOUS
  Filled 2023-07-05 (×3): qty 4

## 2023-07-05 MED ORDER — LORAZEPAM 1 MG PO TABS
1.0000 mg | ORAL_TABLET | ORAL | Status: AC | PRN
  Administered 2023-07-05 (×2): 1 mg via ORAL
  Administered 2023-07-06 – 2023-07-07 (×3): 2 mg via ORAL
  Filled 2023-07-05: qty 2
  Filled 2023-07-05: qty 1
  Filled 2023-07-05: qty 2
  Filled 2023-07-05: qty 4
  Filled 2023-07-05: qty 2

## 2023-07-05 MED ORDER — ADULT MULTIVITAMIN W/MINERALS CH
1.0000 | ORAL_TABLET | Freq: Every day | ORAL | Status: DC
  Administered 2023-07-06 – 2023-07-15 (×7): 1 via ORAL
  Filled 2023-07-05 (×9): qty 1

## 2023-07-05 MED ORDER — ONDANSETRON HCL 4 MG/2ML IJ SOLN
4.0000 mg | Freq: Once | INTRAMUSCULAR | Status: AC
  Administered 2023-07-05: 4 mg via INTRAVENOUS
  Filled 2023-07-05: qty 2

## 2023-07-05 MED ORDER — IBUPROFEN 400 MG PO TABS
400.0000 mg | ORAL_TABLET | Freq: Once | ORAL | Status: AC
  Administered 2023-07-05: 400 mg via ORAL
  Filled 2023-07-05: qty 1

## 2023-07-05 MED ORDER — SODIUM CHLORIDE 0.9 % IV BOLUS
1000.0000 mL | Freq: Once | INTRAVENOUS | Status: AC
  Administered 2023-07-05: 1000 mL via INTRAVENOUS

## 2023-07-05 MED ORDER — THIAMINE MONONITRATE 100 MG PO TABS
100.0000 mg | ORAL_TABLET | Freq: Every day | ORAL | Status: DC
  Administered 2023-07-06 – 2023-07-11 (×5): 100 mg via ORAL
  Filled 2023-07-05 (×7): qty 1

## 2023-07-05 MED ORDER — HYDRALAZINE HCL 20 MG/ML IJ SOLN
10.0000 mg | Freq: Four times a day (QID) | INTRAMUSCULAR | Status: DC | PRN
  Administered 2023-07-05: 10 mg via INTRAVENOUS
  Filled 2023-07-05: qty 1

## 2023-07-05 MED ORDER — THIAMINE HCL 100 MG/ML IJ SOLN
100.0000 mg | Freq: Every day | INTRAMUSCULAR | Status: DC
  Administered 2023-07-05: 100 mg via INTRAVENOUS
  Filled 2023-07-05: qty 2

## 2023-07-05 MED ORDER — FOLIC ACID 1 MG PO TABS
1.0000 mg | ORAL_TABLET | Freq: Every day | ORAL | Status: DC
  Administered 2023-07-06 – 2023-07-15 (×6): 1 mg via ORAL
  Filled 2023-07-05 (×9): qty 1

## 2023-07-05 MED ORDER — DROPERIDOL 2.5 MG/ML IJ SOLN
1.2500 mg | Freq: Once | INTRAMUSCULAR | Status: AC
  Administered 2023-07-05: 1.25 mg via INTRAVENOUS
  Filled 2023-07-05: qty 2

## 2023-07-05 MED ORDER — LORAZEPAM 2 MG/ML IJ SOLN
1.0000 mg | INTRAMUSCULAR | Status: AC | PRN
  Administered 2023-07-06 (×4): 2 mg via INTRAVENOUS
  Administered 2023-07-07 (×3): 4 mg via INTRAVENOUS
  Administered 2023-07-07 (×2): 2 mg via INTRAVENOUS
  Administered 2023-07-07 (×2): 4 mg via INTRAVENOUS
  Administered 2023-07-07: 2 mg via INTRAVENOUS
  Administered 2023-07-07 (×2): 4 mg via INTRAVENOUS
  Filled 2023-07-05: qty 1
  Filled 2023-07-05: qty 2
  Filled 2023-07-05 (×2): qty 1
  Filled 2023-07-05 (×2): qty 2
  Filled 2023-07-05: qty 1
  Filled 2023-07-05 (×3): qty 2
  Filled 2023-07-05 (×2): qty 1
  Filled 2023-07-05: qty 2
  Filled 2023-07-05: qty 1
  Filled 2023-07-05: qty 2

## 2023-07-05 MED ORDER — LORAZEPAM 2 MG/ML IJ SOLN
2.0000 mg | Freq: Once | INTRAMUSCULAR | Status: AC
  Administered 2023-07-05: 2 mg via INTRAVENOUS
  Filled 2023-07-05: qty 1

## 2023-07-05 NOTE — ED Provider Notes (Signed)
Heidi [name] EMERGENCY DEPARTMENT AT [facility] Provider Note   CSN: [id number] Arrival date & time: 07/05/23  1633     History {Add pertinent medical, surgical, social history, OB history to HPI:1} Chief Complaint  Patient presents with   Withdrawal    Heidi [name] is a 52 y.o. female.  She is brought in by ambulance from home with complaint of nausea vomiting and pain all over her body.  She thinks it is related to alcohol withdrawal.  She said she usually drinks 4 40 ounce beers a day.  Only had 2 beers today and symptoms acutely worsened.  Complaining of pain in her back chest arms legs head.  She said she has had some falls.  History of hypertension.  Denies chance of pregnancy.  The history is provided by the patient.  Emesis Severity:  Severe Duration:  1 day Timing:  Constant Progression:  Unchanged Chronicity:  New Relieved by:  Nothing Worsened by:  Nothing Ineffective treatments:  None tried Associated symptoms: abdominal pain, headaches and myalgias   Associated symptoms: no cough, no diarrhea and no fever   Risk factors: alcohol use        Home Medications Prior to Admission medications   Medication Sig Start Date End Date Taking? Authorizing Provider  chlordiazePOXIDE (LIBRIUM) 5 MG capsule Take 2 tablet by mouth 3 times a day x 2 days; then 1 tablet by mouth 3 times a day x 2 days; then 1 tablet by mouth twice a day x 2 days; then 1 tablet by mouth daily x 3 days and stop Librium. 04/15/23   Vassie Loll, MD  cyanocobalamin (VITAMIN B12) 1000 MCG tablet Take 1 tablet (1,000 mcg total) by mouth daily. 04/15/23   Vassie Loll, MD  cyclobenzaprine (FLEXERIL) 5 MG tablet TAKE 1 TABLET BY MOUTH (3) TIMES A DAY AS NEEDED FOR MUSCLE SPASMS. 06/27/23   Del Newman Nip, Tenna Child, FNP  diltiazem (CARDIZEM CD) 120 MG 24 hr capsule Take 1 capsule (120 mg total) by mouth daily. 04/16/23   Vassie Loll, MD  folic acid (FOLVITE) 1 MG tablet Take 1 tablet (1 mg  total) by mouth daily. 10/21/22   Cleora Fleet, MD  Multiple Vitamin (MULTIVITAMIN WITH MINERALS) TABS tablet Take 1 tablet by mouth daily. 10/21/22   Johnson, Clanford L, MD  naltrexone (DEPADE) 50 MG tablet Take 1 tablet (50 mg total) by mouth daily. 05/18/23   Del Nigel Berthold, FNP  naproxen (NAPROSYN) 500 MG tablet Take 1 tablet (500 mg total) by mouth 2 (two) times daily with a meal. 06/04/23   Vickki Hearing, MD  nicotine (NICODERM CQ - DOSED IN MG/24 HOURS) 14 mg/24hr patch Place 1 patch (14 mg total) onto the skin daily. 04/16/23   Vassie Loll, MD  olmesartan (BENICAR) 20 MG tablet Take 0.5 tablets (10 mg total) by mouth daily. 05/27/23   Del Newman Nip, Tenna Child, FNP  ondansetron (ZOFRAN) 4 MG tablet TAKE 1 TABLET BY MOUTH EVERY 6 HOURS 06/27/23   Del Newman Nip, Tenna Child, FNP  pantoprazole (PROTONIX) 40 MG tablet Take 1 tablet (40 mg total) by mouth daily. Patient not taking: Reported on 06/04/2023 04/15/23 04/14/24  Vassie Loll, MD  QUEtiapine (SEROQUEL) 25 MG tablet Take 1 tablet (25 mg total) by mouth at bedtime. 04/15/23   Vassie Loll, MD  thiamine (VITAMIN B-1) 100 MG tablet Take 1 tablet (100 mg total) by mouth daily. 05/18/23   Del Nigel Berthold, FNP  Allergies    Patient has no known allergies.    Review of Systems   Review of Systems  Constitutional:  Negative for fever.  Respiratory:  Negative for cough.   Cardiovascular:  Positive for chest pain.  Gastrointestinal:  Positive for abdominal pain and vomiting. Negative for diarrhea.  Musculoskeletal:  Positive for myalgias.  Neurological:  Positive for headaches.    Physical Exam Updated Vital Signs BP (!) 159/108 (BP Location: Right Arm)   Pulse 92   Temp 98.6 F (37 C) (Oral)   Resp (!) 24   Ht 5\' 3"  (1.6 m)   Wt 65.8 kg   SpO2 100%   BMI 25.69 kg/m  Physical Exam Vitals and nursing note reviewed.  Constitutional:      General: She is in acute distress.     Appearance: Normal  appearance. She is well-developed. She is diaphoretic.  HENT:     Head: Normocephalic and atraumatic.  Eyes:     Conjunctiva/sclera: Conjunctivae normal.  Cardiovascular:     Rate and Rhythm: Regular rhythm. Tachycardia present.     Heart sounds: No murmur heard. Pulmonary:     Effort: Pulmonary effort is normal. No respiratory distress.     Breath sounds: Normal breath sounds.  Abdominal:     Palpations: Abdomen is soft.     Tenderness: There is no abdominal tenderness. There is no guarding or rebound.  Musculoskeletal:        General: No deformity. Normal range of motion.     Cervical back: Neck supple.  Skin:    General: Skin is warm.     Capillary Refill: Capillary refill takes less than 2 seconds.  Neurological:     General: No focal deficit present.     Mental Status: She is alert and oriented to person, place, and time.     ED Results / Procedures / Treatments   Labs (all labs ordered are listed, but only abnormal results are displayed) Labs Reviewed  COMPREHENSIVE METABOLIC PANEL  ETHANOL  CBC  RAPID URINE DRUG SCREEN, HOSP PERFORMED  URINALYSIS, ROUTINE W REFLEX MICROSCOPIC  PREGNANCY, URINE  HCG, SERUM, QUALITATIVE    EKG None  Radiology No results found.  Procedures Procedures  {Document cardiac monitor, telemetry assessment procedure when appropriate:1}  Medications Ordered in ED Medications  sodium chloride 0.9 % bolus 1,000 mL (has no administration in time range)  ondansetron (ZOFRAN) injection 4 mg (has no administration in time range)  LORazepam (ATIVAN) injection 2 mg (has no administration in time range)  LORazepam (ATIVAN) tablet 1-4 mg (has no administration in time range)    Or  LORazepam (ATIVAN) injection 1-4 mg (has no administration in time range)  thiamine (VITAMIN B1) tablet 100 mg (has no administration in time range)    Or  thiamine (VITAMIN B1) injection 100 mg (has no administration in time range)  folic acid (FOLVITE) tablet  1 mg (has no administration in time range)  multivitamin with minerals tablet 1 tablet (has no administration in time range)    ED Course/ Medical Decision Making/ A&P   {   Click here for ABCD2, HEART and other calculatorsREFRESH Note before signing :1}                          Medical Decision Making Amount and/or Complexity of Data Reviewed Labs: ordered.  Risk OTC drugs. Prescription drug management.   This patient complains of ***; this involves an extensive number  of treatment Options and is a complaint that carries with it a high risk of complications and morbidity. The differential includes ***  I ordered, reviewed and interpreted labs, which included *** I ordered medication *** and reviewed PMP when indicated. I ordered imaging studies which included *** and I independently    visualized and interpreted imaging which showed *** Additional history obtained from *** Previous records obtained and reviewed *** I consulted *** and discussed lab and imaging findings and discussed disposition.  Cardiac monitoring reviewed, *** Social determinants considered, *** Critical Interventions: ***  After the interventions stated above, I reevaluated the patient and found *** Admission and further testing considered, ***   {Document critical care time when appropriate:1} {Document review of labs and clinical decision tools ie heart score, Chads2Vasc2 etc:1}  {Document your independent review of radiology images, and any outside records:1} {Document your discussion with family members, caretakers, and with consultants:1} {Document social determinants of health affecting pt's care:1} {Document your decision making why or why not admission, treatments were needed:1} Final Clinical Impression(s) / ED Diagnoses Final diagnoses:  None    Rx / DC Orders ED Discharge Orders     None

## 2023-07-05 NOTE — ED Notes (Signed)
Pt has used the bedpan x2 without any urine production, suggested ambulating to BR with assist, pt does not feel she is steady on her feet, offer bedside commode, however none is available at this time (all in use). Pt would like to try again in a little while for a urine sample.

## 2023-07-05 NOTE — ED Triage Notes (Signed)
Pt via [facility] from home c/o n/v with history of heavy ETOH use. Pt only had 2 beers today and ran out, so now she is diaphoretic, tachycardic, constantly nauseated, and is actively vomiting upon arrival.   BP 171/101 HR 124 O2 97% RA Sinus tach on EKG per EMS

## 2023-07-05 NOTE — ED Notes (Signed)
 Pt given ice water for PO challenge.

## 2023-07-05 NOTE — ED Notes (Signed)
ED TO INPATIENT HANDOFF REPORT  ED Nurse Name and Phone #: Elnita Maxwell [phone]  S Name/Age/Gender Heidi [name] 52 y.o. female Room/Bed: APA11/APA11  Code Status   Code Status: Prior  Home/SNF/Other Home Patient oriented to: self, place, time, and situation Is this baseline? Yes   Triage Complete: Triage complete  Chief Complaint Alcohol withdrawal (HCC) [F10.939]  Triage Note Pt via [facility] from home c/o n/v with history of heavy ETOH use. Pt only had 2 beers today and ran out, so now she is diaphoretic, tachycardic, constantly nauseated, and is actively vomiting upon arrival.   BP 171/101 HR 124 O2 97% RA Sinus tach on EKG per EMS   Allergies No Known Allergies  Level of Care/Admitting Diagnosis ED Disposition     ED Disposition  Admit   Condition  --   Comment  Hospital Area: [facility] [[id number]]  Level of Care: Stepdown [14]  Covid Evaluation: Asymptomatic - no recent exposure (last 10 days) testing not required  Diagnosis: Alcohol withdrawal (HCC) [291.81.ICD-9-CM]  Admitting Physician: Frankey Shown [[id number]]  Attending Physician: Frankey Shown [[id number]]  Certification:: I certify this patient will need inpatient services for at least 2 midnights  Estimated Length of Stay: 3          B Medical/Surgery History Past Medical History:  Diagnosis Date   Alcohol withdrawal (HCC)    Cervical radiculopathy    History reviewed. No pertinent surgical history.   A IV Location/Drains/Wounds Patient Lines/Drains/Airways Status     Active Line/Drains/Airways     Name Placement date Placement time Site Days   Peripheral IV 07/05/23 20 G Right;Posterior Wrist 07/05/23  1815  Wrist  less than 1            Intake/Output Last 24 hours  Intake/Output Summary (Last 24 hours) at 07/05/2023 2130 Last data filed at 07/05/2023 2001 Gross per 24 hour  Intake 1000 ml  Output --  Net 1000 ml    Labs/Imaging Results for orders placed or  performed during the hospital encounter of 07/05/23 (from the past 48 hour(s))  Comprehensive metabolic panel     Status: Abnormal   Collection Time: 07/05/23  5:25 PM  Result Value Ref Range   Sodium 141 135 - 145 mmol/L   Potassium 3.4 (L) 3.5 - 5.1 mmol/L   Chloride 109 98 - 111 mmol/L   CO2 18 (L) 22 - 32 mmol/L   Glucose, Bld 103 (H) 70 - 99 mg/dL    Comment: Glucose reference range applies only to samples taken after fasting for at least 8 hours.   BUN 11 6 - 20 mg/dL   Creatinine, Ser 4.40 0.44 - 1.00 mg/dL   Calcium 10.2 8.9 - 72.5 mg/dL   Total Protein 8.1 6.5 - 8.1 g/dL   Albumin 4.6 3.5 - 5.0 g/dL   AST 366 (H) 15 - 41 U/L   ALT 77 (H) 0 - 44 U/L   Alkaline Phosphatase 96 38 - 126 U/L   Total Bilirubin 1.6 (H) 0.3 - 1.2 mg/dL   GFR, Estimated >44 >03 mL/min    Comment: (NOTE) Calculated using the CKD-EPI Creatinine Equation (2021)    Anion gap 14 5 - 15    Comment: Performed at [facility], [address], [place], [place] [address]  Ethanol     Status: None   Collection Time: 07/05/23  5:25 PM  Result Value Ref Range   Alcohol, Ethyl (B) <10 <10 mg/dL    Comment: (NOTE) Lowest  detectable limit for serum alcohol is 10 mg/dL.  For medical purposes only. Performed at [facility], [address] [place], [place] [address]   cbc     Status: Abnormal   Collection Time: 07/05/23  5:25 PM  Result Value Ref Range   WBC 5.3 4.0 - 10.5 K/uL   RBC 3.94 3.87 - 5.11 MIL/uL   Hemoglobin 12.0 12.0 - 15.0 g/dL   HCT 66.4 (L) 40.3 - 47.4 %   MCV 90.1 80.0 - 100.0 fL   MCH 30.5 26.0 - 34.0 pg   MCHC 33.8 30.0 - 36.0 g/dL   RDW 25.9 56.3 - 87.5 %   Platelets 73 (L) 150 - 400 K/uL    Comment: Immature Platelet Fraction may be clinically indicated, consider ordering this additional test [id number] REPEATED TO VERIFY PLATELET COUNT CONFIRMED BY SMEAR    nRBC 0.0 0.0 - 0.2 %    Comment: Performed at [facility], [address], [place], [place] [address]  hCG, serum,  qualitative     Status: None   Collection Time: 07/05/23  6:34 PM  Result Value Ref Range   Preg, Serum NEGATIVE NEGATIVE    Comment:        THE SENSITIVITY OF THIS METHODOLOGY IS >10 mIU/mL. Performed at [facility], [address] [place], [place] [address]    No results found.  Pending Labs Unresulted Labs (From admission, onward)     Start     Ordered   07/05/23 1745  Urinalysis, Routine w reflex microscopic -Urine, Clean Catch  Once,   URGENT       Question:  Specimen Source  Answer:  Urine, Clean Catch   07/05/23 1744   07/05/23 1745  Pregnancy, urine  Once,   URGENT        07/05/23 1744   07/05/23 1653  Rapid urine drug screen (hospital performed)  Once,   STAT        07/05/23 1652            Vitals/Pain Today's Vitals   07/05/23 2015 07/05/23 2045 07/05/23 2100 07/05/23 2104  BP: (!) 164/100 (!) 158/110 (!) 155/103   Pulse: 88 99 79   Resp: 17  (!) 22   Temp:    98 F (36.7 C)  TempSrc:    Oral  SpO2: 97%  99%   Weight:      Height:      PainSc:        Isolation Precautions No active isolations  Medications Medications  LORazepam (ATIVAN) tablet 1-4 mg (1 mg Oral Given 07/05/23 2047)    Or  LORazepam (ATIVAN) injection 1-4 mg ( Intravenous See Alternative 07/05/23 2047)  thiamine (VITAMIN B1) tablet 100 mg ( Oral See Alternative 07/05/23 1818)    Or  thiamine (VITAMIN B1) injection 100 mg (100 mg Intravenous Given 07/05/23 1818)  folic acid (FOLVITE) tablet 1 mg (0 mg Oral Hold 07/05/23 1826)  multivitamin with minerals tablet 1 tablet (0 tablets Oral Hold 07/05/23 1826)  sodium chloride 0.9 % bolus 1,000 mL (0 mLs Intravenous Stopped 07/05/23 2001)  ondansetron (ZOFRAN) injection 4 mg (4 mg Intravenous Given 07/05/23 1817)  LORazepam (ATIVAN) injection 2 mg (2 mg Intravenous Given 07/05/23 1818)  droperidol (INAPSINE) 2.5 MG/ML injection 1.25 mg (1.25 mg Intravenous Given 07/05/23 1902)  sodium chloride 0.9 % bolus 1,000 mL (1,000 mLs Intravenous New  Bag/Given 07/05/23 2045)    Mobility walks   R Recommendations: See Admitting Provider Note  Report  given to:   Additional Notes: A&O x4, has raspy voice, CIWA has improved since arrival from CIWA 26 to a 6. Pt is a every day beer drinker, but "ran out". Pt is handling PO challenge at this time with ice water. HR has been NSR on the monitor, when she moves around in bed cardiac monitor will show ST. Pt reports she feels "much better" since when she first arrived, no family at bedside.
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
## 2023-07-05 NOTE — ED Notes (Signed)
Pt given 2 additional cups of ice water, pt handling sips of water well, reports no n/v sensations.

## 2023-07-06 ENCOUNTER — Inpatient Hospital Stay (HOSPITAL_COMMUNITY): Payer: Medicaid Other

## 2023-07-06 DIAGNOSIS — D696 Thrombocytopenia, unspecified: Secondary | ICD-10-CM | POA: Insufficient documentation

## 2023-07-06 DIAGNOSIS — E876 Hypokalemia: Secondary | ICD-10-CM | POA: Insufficient documentation

## 2023-07-06 DIAGNOSIS — F10931 Alcohol use, unspecified with withdrawal delirium: Secondary | ICD-10-CM | POA: Diagnosis not present

## 2023-07-06 LAB — COMPREHENSIVE METABOLIC PANEL
ALT: 52 U/L — ABNORMAL HIGH (ref 0–44)
AST: 64 U/L — ABNORMAL HIGH (ref 15–41)
Albumin: 3.6 g/dL (ref 3.5–5.0)
Alkaline Phosphatase: 72 U/L (ref 38–126)
Anion gap: 9 (ref 5–15)
BUN: 12 mg/dL (ref 6–20)
CO2: 20 mmol/L — ABNORMAL LOW (ref 22–32)
Calcium: 9 mg/dL (ref 8.9–10.3)
Chloride: 108 mmol/L (ref 98–111)
Creatinine, Ser: 0.72 mg/dL (ref 0.44–1.00)
GFR, Estimated: >60 mL/min (ref >60)
Glucose, Bld: 101 mg/dL — ABNORMAL HIGH (ref 70–99)
Potassium: 3.5 mmol/L (ref 3.5–5.1)
Sodium: 137 mmol/L (ref 135–145)
Total Bilirubin: 1.3 mg/dL — ABNORMAL HIGH (ref 0.3–1.2)
Total Protein: 6.6 g/dL (ref 6.5–8.1)

## 2023-07-06 LAB — MRSA NEXT GEN BY PCR, NASAL: MRSA by PCR Next Gen: NOT DETECTED

## 2023-07-06 LAB — CBC
HCT: 30.4 % — ABNORMAL LOW (ref 36.0–46.0)
Hemoglobin: 10.4 g/dL — ABNORMAL LOW (ref 12.0–15.0)
MCH: 30.8 pg (ref 26.0–34.0)
MCHC: 34.2 g/dL (ref 30.0–36.0)
MCV: 89.9 fL (ref 80.0–100.0)
Platelets: 64 10*3/uL — ABNORMAL LOW (ref 150–400)
RBC: 3.38 MIL/uL — ABNORMAL LOW (ref 3.87–5.11)
RDW: 14.8 % (ref 11.5–15.5)
WBC: 3.9 10*3/uL — ABNORMAL LOW (ref 4.0–10.5)
nRBC: 0 % (ref 0.0–0.2)

## 2023-07-06 LAB — PHOSPHORUS: Phosphorus: 2.8 mg/dL (ref 2.5–4.6)

## 2023-07-06 LAB — MAGNESIUM: Magnesium: 1.8 mg/dL (ref 1.7–2.4)

## 2023-07-06 MED ORDER — TRAMADOL HCL 50 MG PO TABS
50.0000 mg | ORAL_TABLET | Freq: Four times a day (QID) | ORAL | Status: DC | PRN
  Administered 2023-07-06 – 2023-07-09 (×2): 50 mg via ORAL
  Filled 2023-07-06 (×2): qty 1

## 2023-07-06 MED ORDER — POTASSIUM CHLORIDE 10 MEQ/100ML IV SOLN
10.0000 meq | INTRAVENOUS | Status: AC
  Administered 2023-07-06 (×3): 10 meq via INTRAVENOUS
  Filled 2023-07-06 (×3): qty 100

## 2023-07-06 MED ORDER — ACETAMINOPHEN 325 MG PO TABS
650.0000 mg | ORAL_TABLET | Freq: Four times a day (QID) | ORAL | Status: DC | PRN
  Administered 2023-07-06 – 2023-07-15 (×2): 650 mg via ORAL
  Filled 2023-07-06 (×2): qty 2

## 2023-07-06 MED ORDER — ACETAMINOPHEN 650 MG RE SUPP
650.0000 mg | Freq: Four times a day (QID) | RECTAL | Status: DC | PRN
  Filled 2023-07-06: qty 1

## 2023-07-06 MED ORDER — ENOXAPARIN SODIUM 40 MG/0.4ML IJ SOSY: 40.0000 mg | PREFILLED_SYRINGE | Freq: Every day | INTRAMUSCULAR | Status: DC

## 2023-07-06 MED ORDER — CHLORDIAZEPOXIDE HCL 5 MG PO CAPS
10.0000 mg | ORAL_CAPSULE | Freq: Three times a day (TID) | ORAL | Status: DC
  Administered 2023-07-06 – 2023-07-08 (×7): 10 mg via ORAL
  Filled 2023-07-06 (×9): qty 2

## 2023-07-06 MED ORDER — DILTIAZEM HCL ER COATED BEADS 120 MG PO CP24
120.0000 mg | ORAL_CAPSULE | Freq: Every day | ORAL | Status: DC
  Administered 2023-07-06 – 2023-07-15 (×5): 120 mg via ORAL
  Filled 2023-07-06 (×8): qty 1

## 2023-07-06 MED ORDER — ONDANSETRON HCL 4 MG/2ML IJ SOLN
4.0000 mg | Freq: Four times a day (QID) | INTRAMUSCULAR | Status: DC | PRN
  Administered 2023-07-06 – 2023-07-12 (×3): 4 mg via INTRAVENOUS
  Filled 2023-07-06 (×3): qty 2

## 2023-07-06 MED ORDER — OXYCODONE HCL 5 MG PO TABS
5.0000 mg | ORAL_TABLET | Freq: Four times a day (QID) | ORAL | Status: DC | PRN
  Administered 2023-07-06 – 2023-07-11 (×6): 5 mg via ORAL
  Filled 2023-07-06 (×7): qty 1

## 2023-07-06 MED ORDER — ONDANSETRON HCL 4 MG PO TABS: 4.0000 mg | ORAL_TABLET | Freq: Four times a day (QID) | ORAL | Status: DC | PRN

## 2023-07-06 MED ORDER — NICOTINE 14 MG/24HR TD PT24
14.0000 mg | MEDICATED_PATCH | Freq: Every day | TRANSDERMAL | Status: DC
  Administered 2023-07-06 – 2023-07-23 (×18): 14 mg via TRANSDERMAL
  Filled 2023-07-06 (×19): qty 1

## 2023-07-06 MED ORDER — IRBESARTAN 150 MG PO TABS
150.0000 mg | ORAL_TABLET | Freq: Every day | ORAL | Status: DC
  Administered 2023-07-06 – 2023-07-10 (×3): 150 mg via ORAL
  Filled 2023-07-06 (×5): qty 1

## 2023-07-06 MED ORDER — CHLORHEXIDINE GLUCONATE CLOTH 2 % EX PADS
6.0000 | MEDICATED_PAD | Freq: Every day | CUTANEOUS | Status: DC
  Administered 2023-07-06 – 2023-07-24 (×23): 6 via TOPICAL

## 2023-07-06 NOTE — Plan of Care (Signed)
  Problem: Education: Goal: Knowledge of General Education information will improve Description Including pain rating scale, medication(s)/side effects and non-pharmacologic comfort measures Outcome: Progressing   

## 2023-07-06 NOTE — Plan of Care (Signed)

## 2023-07-06 NOTE — Progress Notes (Signed)
Patient ambulated to bathroom with stand-by assist, returned to bed and self positioned for comfort.

## 2023-07-06 NOTE — Progress Notes (Signed)
   07/06/23 1112  TOC Brief Assessment  Insurance and Status Reviewed  Patient has primary care physician Yes  Home environment has been reviewed Home with parents  Prior level of function: independent  Prior/Current Home Services No current home services  Social Determinants of Health Reivew SDOH reviewed no interventions necessary  Readmission risk has been reviewed Yes  Transition of care needs no transition of care needs at this time    Transition of Care Department [facility] has reviewed patient and no TOC needs have been identified at this time. We will continue to monitor patient advancement through interdisciplinary progression rounds. If new patient transition needs arise, please place a TOC consult.

## 2023-07-06 NOTE — Progress Notes (Signed)
PROGRESS NOTE    Heidi [name]  ZOX:[id number] DOB: [birth year] DOA: 07/05/2023 PCP: Rica Records, FNP   Brief Narrative:    Heidi [name] is a 52 y.o. female with medical history significant of hypertension, alcohol abuse, tobacco use disorder who presents to the emergency department from home via EMS due to complaints of generalized bodyaches, nausea, vomiting which she thinks was related to alcohol withdrawal.  She continues to have signs of alcohol withdrawal and tremors this morning.  She remains on CIWA protocol and requires continuous monitoring in stepdown unit.  Assessment & Plan:   Principal Problem:   Alcohol withdrawal (HCC) Active Problems:   Nausea & vomiting   Essential hypertension   Transaminitis   Tobacco use disorder   Hypokalemia   Thrombocytopenia (HCC)  Assessment and Plan:   Alcohol withdrawal Alcohol level was < 10 Continue Librium taper , CIWA protocol, thiamine and multivitamin Continue fall precaution Patient will be counseled on alcohol withdrawal cessation when more stable   Transaminitis AST 104, ALT 77, this is possibly related to patient's history of alcohol use disorder Continue to monitor liver enzymes Plan to obtain right upper quadrant ultrasound to evaluate for liver cirrhosis   Nausea and vomiting Continue Zofran as needed   Thrombocytopenia Likely related to alcohol use Change heparin to SCDs Continue to monitor CBC   Essential hypertension Continue irbesartan and Cardizem   Tobacco use disorder Continue nicotine patch Patient will be counseled on tobacco use disorder cessation when more stable    DVT prophylaxis: SCDs Code Status: Full Family Communication: None at bedside Disposition Plan:  Status is: Inpatient Remains inpatient appropriate because: Need for close monitoring.   Consultants:  None  Procedures:  None  Antimicrobials:  None   Subjective: Patient seen and evaluated today with no  new acute complaints or concerns.  She continues to have some ongoing tremors.  No acute concerns or events noted overnight.  Objective: Vitals:   07/06/23 0536 07/06/23 0745 07/06/23 0800 07/06/23 0900  BP:   (!) 166/98 (!) 144/93  Pulse:    (!) 107  Resp:   (!) 28 (!) 27  Temp: 99.2 F (37.3 C) 99 F (37.2 C)    TempSrc: Oral Oral    SpO2:      Weight: 64.8 kg     Height:        Intake/Output Summary (Last 24 hours) at 07/06/2023 0915 Last data filed at 07/05/2023 2143 Gross per 24 hour  Intake 1000 ml  Output 350 ml  Net 650 ml   Filed Weights   07/05/23 1651 07/05/23 2200 07/06/23 0536  Weight: 65.8 kg 64.1 kg 64.8 kg    Examination:  General exam: Appears calm and comfortable  Respiratory system: Clear to auscultation. Respiratory effort normal. Cardiovascular system: S1 & S2 heard, RRR.  Gastrointestinal system: Abdomen is soft Central nervous system: Alert and awake Extremities: No edema Skin: No significant lesions noted Psychiatry: Flat affect.    Data Reviewed: I have personally reviewed following labs and imaging studies  CBC: Recent Labs  Lab 07/05/23 1725 07/06/23 0449  WBC 5.3 3.9*  HGB 12.0 10.4*  HCT 35.5* 30.4*  MCV 90.1 89.9  PLT 73* 64*   Basic Metabolic Panel: Recent Labs  Lab 07/05/23 1725 07/06/23 0449  NA 141 137  K 3.4* 3.5  CL 109 108  CO2 18* 20*  GLUCOSE 103* 101*  BUN 11 12  CREATININE 0.66 0.72  CALCIUM 10.0 9.0  MG  --  1.8  PHOS  --  2.8   GFR: Estimated Creatinine Clearance: 77.1 mL/min (by C-G formula based on SCr of 0.72 mg/dL). Liver Function Tests: Recent Labs  Lab 07/05/23 1725 07/06/23 0449  AST 104* 64*  ALT 77* 52*  ALKPHOS 96 72  BILITOT 1.6* 1.3*  PROT 8.1 6.6  ALBUMIN 4.6 3.6   No results for input(s): "LIPASE", "AMYLASE" in the last 168 hours. No results for input(s): "AMMONIA" in the last 168 hours. Coagulation Profile: No results for input(s): "INR", "PROTIME" in the last 168  hours. Cardiac Enzymes: No results for input(s): "CKTOTAL", "CKMB", "CKMBINDEX", "TROPONINI" in the last 168 hours. BNP (last 3 results) No results for input(s): "PROBNP" in the last 8760 hours. HbA1C: No results for input(s): "HGBA1C" in the last 72 hours. CBG: No results for input(s): "GLUCAP" in the last 168 hours. Lipid Profile: No results for input(s): "CHOL", "HDL", "LDLCALC", "TRIG", "CHOLHDL", "LDLDIRECT" in the last 72 hours. Thyroid Function Tests: No results for input(s): "TSH", "T4TOTAL", "FREET4", "T3FREE", "THYROIDAB" in the last 72 hours. Anemia Panel: No results for input(s): "VITAMINB12", "FOLATE", "FERRITIN", "TIBC", "IRON", "RETICCTPCT" in the last 72 hours. Sepsis Labs: No results for input(s): "PROCALCITON", "LATICACIDVEN" in the last 168 hours.  Recent Results (from the past 240 hour(s))  MRSA Next Gen by PCR, Nasal     Status: None   Collection Time: 07/05/23 10:10 PM   Specimen: Nasal Mucosa; Nasal Swab  Result Value Ref Range Status   MRSA by PCR Next Gen NOT DETECTED NOT DETECTED Final    Comment: (NOTE) The GeneXpert MRSA Assay (FDA approved for NASAL specimens only), is one component of a comprehensive MRSA colonization surveillance program. It is not intended to diagnose MRSA infection nor to guide or monitor treatment for MRSA infections. Test performance is not FDA approved in patients less than 10 years old. Performed at [facility], [address], [place], [place] [address]   Culture, blood (Routine X 2) w Reflex to ID Panel     Status: None (Preliminary result)   Collection Time: 07/05/23 11:45 PM   Specimen: BLOOD LEFT ARM  Result Value Ref Range Status   Specimen Description BLOOD LEFT ARM  Final   Special Requests   Final    BOTTLES DRAWN AEROBIC AND ANAEROBIC Blood Culture adequate volume   Culture   Final    NO GROWTH < 12 HOURS Performed at [facility], [address], [place], [place] [address]    Report Status PENDING  Incomplete   Culture, blood (Routine X 2) w Reflex to ID Panel     Status: None (Preliminary result)   Collection Time: 07/05/23 11:48 PM   Specimen: BLOOD LEFT HAND  Result Value Ref Range Status   Specimen Description BLOOD LEFT HAND  Final   Special Requests   Final    BOTTLES DRAWN AEROBIC AND ANAEROBIC Blood Culture adequate volume   Culture   Final    NO GROWTH < 12 HOURS Performed at [facility], [address], [place], [place] [address]    Report Status PENDING  Incomplete         Radiology Studies: No results found.      Scheduled Meds:  chlordiazePOXIDE  10 mg Oral TID   Chlorhexidine Gluconate Cloth  6 each Topical Daily   diltiazem  120 mg Oral Daily   folic acid  1 mg Oral Daily   irbesartan  150 mg Oral Daily   multivitamin with  minerals  1 tablet Oral Daily   nicotine  14 mg Transdermal Daily   thiamine  100 mg Oral Daily   Or   thiamine  100 mg Intravenous Daily     LOS: 1 day    Time spent: 35 minutes    Brent Taillon Hoover Brunette, DO [facility]  If 7PM-7AM, please contact night-coverage [email or web address] 07/06/2023, 9:15 AM

## 2023-07-07 DIAGNOSIS — F10932 Alcohol use, unspecified with withdrawal with perceptual disturbance: Secondary | ICD-10-CM

## 2023-07-07 LAB — CBC
HCT: 31.5 % — ABNORMAL LOW (ref 36.0–46.0)
Hemoglobin: 10.9 g/dL — ABNORMAL LOW (ref 12.0–15.0)
MCH: 30.8 pg (ref 26.0–34.0)
MCHC: 34.6 g/dL (ref 30.0–36.0)
MCV: 89 fL (ref 80.0–100.0)
Platelets: 68 10*3/uL — ABNORMAL LOW (ref 150–400)
RBC: 3.54 MIL/uL — ABNORMAL LOW (ref 3.87–5.11)
RDW: 14.4 % (ref 11.5–15.5)
WBC: 5.3 10*3/uL (ref 4.0–10.5)
nRBC: 0 % (ref 0.0–0.2)

## 2023-07-07 LAB — COMPREHENSIVE METABOLIC PANEL
ALT: 44 U/L (ref 0–44)
AST: 47 U/L — ABNORMAL HIGH (ref 15–41)
Albumin: 3.9 g/dL (ref 3.5–5.0)
Alkaline Phosphatase: 75 U/L (ref 38–126)
Anion gap: 8 (ref 5–15)
BUN: 6 mg/dL (ref 6–20)
CO2: 23 mmol/L (ref 22–32)
Calcium: 9.5 mg/dL (ref 8.9–10.3)
Chloride: 102 mmol/L (ref 98–111)
Creatinine, Ser: 0.53 mg/dL (ref 0.44–1.00)
GFR, Estimated: >60 mL/min (ref >60)
Glucose, Bld: 129 mg/dL — ABNORMAL HIGH (ref 70–99)
Potassium: 2.9 mmol/L — ABNORMAL LOW (ref 3.5–5.1)
Sodium: 133 mmol/L — ABNORMAL LOW (ref 135–145)
Total Bilirubin: 1.3 mg/dL — ABNORMAL HIGH (ref 0.3–1.2)
Total Protein: 7.1 g/dL (ref 6.5–8.1)

## 2023-07-07 LAB — MAGNESIUM: Magnesium: 1.9 mg/dL (ref 1.7–2.4)

## 2023-07-07 MED ORDER — DEXMEDETOMIDINE HCL IN NACL 400 MCG/100ML IV SOLN
INTRAVENOUS | Status: AC
  Administered 2023-07-07: 0.2 ug/kg/h via INTRAVENOUS
  Filled 2023-07-07: qty 100

## 2023-07-07 MED ORDER — HYDRALAZINE HCL 25 MG PO TABS
50.0000 mg | ORAL_TABLET | Freq: Three times a day (TID) | ORAL | Status: DC
  Administered 2023-07-07 – 2023-07-09 (×6): 50 mg via ORAL
  Filled 2023-07-07 (×6): qty 2

## 2023-07-07 MED ORDER — MAGNESIUM SULFATE 2 GM/50ML IV SOLN
2.0000 g | Freq: Once | INTRAVENOUS | Status: AC
  Administered 2023-07-07: 2 g via INTRAVENOUS
  Filled 2023-07-07: qty 50

## 2023-07-07 MED ORDER — POTASSIUM CHLORIDE CRYS ER 20 MEQ PO TBCR
40.0000 meq | EXTENDED_RELEASE_TABLET | Freq: Once | ORAL | Status: AC
  Administered 2023-07-07: 40 meq via ORAL
  Filled 2023-07-07: qty 2

## 2023-07-07 MED ORDER — DEXMEDETOMIDINE HCL IN NACL 400 MCG/100ML IV SOLN
0.0000 ug/kg/h | INTRAVENOUS | Status: DC
  Administered 2023-07-08: 1 ug/kg/h via INTRAVENOUS
  Administered 2023-07-09: 0.7 ug/kg/h via INTRAVENOUS
  Administered 2023-07-09: 1 ug/kg/h via INTRAVENOUS
  Administered 2023-07-09: 1.1 ug/kg/h via INTRAVENOUS
  Administered 2023-07-10: 1.2 ug/kg/h via INTRAVENOUS
  Administered 2023-07-10 (×3): 1 ug/kg/h via INTRAVENOUS
  Administered 2023-07-11: 1.002 ug/kg/h via INTRAVENOUS
  Administered 2023-07-11: 0.9 ug/kg/h via INTRAVENOUS
  Administered 2023-07-11: 0.8 ug/kg/h via INTRAVENOUS
  Administered 2023-07-12: 0.4 ug/kg/h via INTRAVENOUS
  Filled 2023-07-07 (×14): qty 100

## 2023-07-07 MED ORDER — HALOPERIDOL LACTATE 5 MG/ML IJ SOLN
2.0000 mg | Freq: Once | INTRAMUSCULAR | Status: AC
  Administered 2023-07-07: 2 mg via INTRAMUSCULAR
  Filled 2023-07-07: qty 1

## 2023-07-07 NOTE — Progress Notes (Signed)
Patient OOB left room, gait unsteady, Redirected to bed by staff, PRN given ineffective for agitation.

## 2023-07-07 NOTE — Progress Notes (Signed)
PROGRESS NOTE    Heidi [name]  BJY:[id number] DOB: 24-Sep-1971 DOA: 07/05/2023 PCP: Rica Records, FNP   Brief Narrative:    Heidi [name] is a 52 y.o. female with medical history significant of hypertension, alcohol abuse, tobacco use disorder who presents to the emergency department from home via EMS due to complaints of generalized bodyaches, nausea, vomiting which she thinks was related to alcohol withdrawal.  She continues to have signs of alcohol withdrawal and tremors this morning.  She remains on CIWA protocol and requires continuous monitoring in stepdown unit.   Subjective: Examined this morning, awake, agitated, confused, Medically stable  Assessment & Plan:   Principal Problem:   Alcohol withdrawal (HCC) Active Problems:   Nausea & vomiting   Essential hypertension   Transaminitis   Tobacco use disorder   Hypokalemia   Thrombocytopenia (HCC)  Assessment and Plan:   Alcohol withdrawal Alcohol level was < 10 -Remain agitated, confused this morning Continue Librium taper , CIWA protocol, thiamine and multivitamin Continue fall precaution Patient will be counseled on alcohol withdrawal cessation when more stable   Transaminitis    Latest Ref Rng & Units 07/07/2023    5:12 AM 07/06/2023    4:49 AM 07/05/2023    5:25 PM  Hepatic Function  Total Protein 6.5 - 8.1 g/dL 7.1  6.6  8.1   Albumin 3.5 - 5.0 g/dL 3.9  3.6  4.6   AST 15 - 41 U/L 47  64  104   ALT 0 - 44 U/L 44  52  77   Alk Phosphatase 38 - 126 U/L 75  72  96   Total Bilirubin 0.3 - 1.2 mg/dL 1.3  1.3  1.6    --Improving Possibly related to patient's history of alcohol use disorder Continue to monitor liver enzymes Plan to obtain right upper quadrant ultrasound to evaluate for liver cirrhosis   Nausea and vomiting Continue Zofran as needed   Thrombocytopenia Likely related to alcohol use Change heparin to SCDs Continue to monitor CBC   Essential hypertension Continue  irbesartan and Cardizem   Tobacco use disorder Continue nicotine patch Patient will be counseled on tobacco use disorder cessation when more stable    DVT prophylaxis: SCDs Code Status: Full Family Communication: None at bedside Disposition Plan:  Status is: Inpatient Remains inpatient appropriate because: Need for close monitoring.   Consultants:  None  Procedures:  None  Antimicrobials:  None    Objective: Vitals:   07/07/23 0600 07/07/23 0700 07/07/23 0902 07/07/23 0932  BP: (!) 181/104 (!) 166/99 (!) 147/94   Pulse: (!) 43     Resp: (!) 23 19 (!) 21   Temp:  97.6 F (36.4 C)    TempSrc:  Axillary    SpO2:    100%  Weight:      Height:        Intake/Output Summary (Last 24 hours) at 07/07/2023 1118 Last data filed at 07/07/2023 0631 Gross per 24 hour  Intake 240 ml  Output --  Net 240 ml   Filed Weights   07/05/23 2200 07/06/23 0536 07/07/23 0522  Weight: 64.1 kg 64.8 kg 65.1 kg    Examination:       General:  Awake, agitated, confused  HEENT:  Normocephalic, PERRL, otherwise with in Normal limits   Neuro:  CNII-XII intact. , normal motor and sensation, reflexes intact   Lungs:   Clear to auscultation BL, Respirations unlabored,  No wheezes / crackles  Cardio:  S1/S2, RRR, No murmure, No Rubs or Gallops   Abdomen:  Soft, non-tender, bowel sounds active all four quadrants, no guarding or peritoneal signs.  Muscular  skeletal:  Limited exam -global generalized weaknesses - in bed, able to move all 4 extremities,   2+ pulses,  symmetric, No pitting edema  Skin:  Dry, warm to touch, negative for any Rashes,  Wounds: Please see nursing documentation            Data Reviewed: I have personally reviewed following labs and imaging studies  CBC: Recent Labs  Lab 07/05/23 1725 07/06/23 0449 07/07/23 0512  WBC 5.3 3.9* 5.3  HGB 12.0 10.4* 10.9*  HCT 35.5* 30.4* 31.5*  MCV 90.1 89.9 89.0  PLT 73* 64* 68*   Basic Metabolic  Panel: Recent Labs  Lab 07/05/23 1725 07/06/23 0449 07/07/23 0512  NA 141 137 133*  K 3.4* 3.5 2.9*  CL 109 108 102  CO2 18* 20* 23  GLUCOSE 103* 101* 129*  BUN 11 12 6   CREATININE 0.66 0.72 0.53  CALCIUM 10.0 9.0 9.5  MG  --  1.8 1.9  PHOS  --  2.8  --    GFR: Estimated Creatinine Clearance: 77.2 mL/min (by C-G formula based on SCr of 0.53 mg/dL). Liver Function Tests: Recent Labs  Lab 07/05/23 1725 07/06/23 0449 07/07/23 0512  AST 104* 64* 47*  ALT 77* 52* 44  ALKPHOS 96 72 75  BILITOT 1.6* 1.3* 1.3*  PROT 8.1 6.6 7.1  ALBUMIN 4.6 3.6 3.9     Recent Results (from the past 240 hour(s))  MRSA Next Gen by PCR, Nasal     Status: None   Collection Time: 07/05/23 10:10 PM   Specimen: Nasal Mucosa; Nasal Swab  Result Value Ref Range Status   MRSA by PCR Next Gen NOT DETECTED NOT DETECTED Final    Comment: (NOTE) The GeneXpert MRSA Assay (FDA approved for NASAL specimens only), is one component of a comprehensive MRSA colonization surveillance program. It is not intended to diagnose MRSA infection nor to guide or monitor treatment for MRSA infections. Test performance is not FDA approved in patients less than 65 years old. Performed at [facility], [address] [place], [place] [address]   Culture, blood (Routine X 2) w Reflex to ID Panel     Status: None (Preliminary result)   Collection Time: 07/05/23 11:45 PM   Specimen: BLOOD LEFT ARM  Result Value Ref Range Status   Specimen Description BLOOD LEFT ARM  Final   Special Requests   Final    BOTTLES DRAWN AEROBIC AND ANAEROBIC Blood Culture adequate volume   Culture   Final    NO GROWTH 1 DAY Performed at [facility], [address], [place], [place] [address]    Report Status PENDING  Incomplete  Culture, blood (Routine X 2) w Reflex to ID Panel     Status: None (Preliminary result)   Collection Time: 07/05/23 11:48 PM   Specimen: BLOOD LEFT HAND  Result Value Ref Range Status   Specimen Description  BLOOD LEFT HAND  Final   Special Requests   Final    BOTTLES DRAWN AEROBIC AND ANAEROBIC Blood Culture adequate volume   Culture   Final    NO GROWTH 1 DAY Performed at [facility], [address] [place], [place] [address]    Report Status PENDING  Incomplete         Radiology Studies: US Abdomen Limited RUQ (LIVER/GB)  Result Date: 07/06/2023  CLINICAL DATA:  Transaminitis EXAM: ULTRASOUND ABDOMEN LIMITED RIGHT UPPER QUADRANT COMPARISON:  None Available. FINDINGS: Gallbladder: No gallstones or wall thickening visualized. No sonographic Murphy sign noted by sonographer. Common bile duct: Diameter: 0.3 cm Liver: No focal lesion identified. Increased parenchymal echogenicity. Portal vein is patent on color Doppler imaging with normal direction of blood flow towards the liver. Other: None. IMPRESSION: 1. Hepatic steatosis. 2. Otherwise unremarkable right upper quadrant ultrasound. Electronically Signed   By: Jearld Lesch M.D.   On: 07/06/2023 14:47        Scheduled Meds:  chlordiazePOXIDE  10 mg Oral TID   Chlorhexidine Gluconate Cloth  6 each Topical Daily   diltiazem  120 mg Oral Daily   folic acid  1 mg Oral Daily   hydrALAZINE  50 mg Oral Q8H   irbesartan  150 mg Oral Daily   multivitamin with minerals  1 tablet Oral Daily   nicotine  14 mg Transdermal Daily   thiamine  100 mg Oral Daily   Or   thiamine  100 mg Intravenous Daily     LOS: 2 days    Kendell Bane, MD [facility] Greater than 55 minutes of critical time was spent evaluating, reviewing labs, medications, drawn plan of care.  If 7PM-7AM, please contact night-coverage [email or web address]

## 2023-07-07 NOTE — Progress Notes (Signed)
Patient up OOB disoriented and unsteady, patient is visibly agitated and states she has tooth pain 8/10 and has to leave for her appointment, Patient redirected to bed and given PRN for pain and agitation CIWA = 12.

## 2023-07-07 NOTE — Plan of Care (Signed)

## 2023-07-08 ENCOUNTER — Ambulatory Visit (HOSPITAL_COMMUNITY): Payer: Medicaid Other | Admitting: Physical Therapy

## 2023-07-08 ENCOUNTER — Ambulatory Visit: Payer: Medicaid Other | Admitting: Family Medicine

## 2023-07-08 DIAGNOSIS — F10931 Alcohol use, unspecified with withdrawal delirium: Secondary | ICD-10-CM | POA: Diagnosis not present

## 2023-07-08 DIAGNOSIS — E876 Hypokalemia: Secondary | ICD-10-CM | POA: Diagnosis not present

## 2023-07-08 LAB — GLUCOSE, CAPILLARY: Glucose-Capillary: 170 mg/dL — ABNORMAL HIGH (ref 70–99)

## 2023-07-08 MED ORDER — LORAZEPAM 1 MG PO TABS: 1.0000 mg | ORAL_TABLET | ORAL | Status: AC | PRN

## 2023-07-08 MED ORDER — HYDRALAZINE HCL 20 MG/ML IJ SOLN
10.0000 mg | INTRAMUSCULAR | Status: DC | PRN
  Administered 2023-07-08 – 2023-07-15 (×6): 10 mg via INTRAVENOUS
  Filled 2023-07-08 (×7): qty 1

## 2023-07-08 MED ORDER — POTASSIUM CHLORIDE CRYS ER 20 MEQ PO TBCR
40.0000 meq | EXTENDED_RELEASE_TABLET | Freq: Once | ORAL | Status: AC
  Administered 2023-07-08: 40 meq via ORAL
  Filled 2023-07-08: qty 2

## 2023-07-08 MED ORDER — KCL IN DEXTROSE-NACL 10-5-0.45 MEQ/L-%-% IV SOLN
INTRAVENOUS | Status: DC
  Filled 2023-07-08 (×3): qty 1000

## 2023-07-08 MED ORDER — LORAZEPAM 2 MG/ML IJ SOLN
1.0000 mg | INTRAMUSCULAR | Status: AC | PRN
  Administered 2023-07-08 – 2023-07-09 (×4): 2 mg via INTRAVENOUS
  Administered 2023-07-09: 4 mg via INTRAVENOUS
  Administered 2023-07-09 – 2023-07-10 (×4): 2 mg via INTRAVENOUS
  Administered 2023-07-10: 1 mg via INTRAVENOUS
  Administered 2023-07-11 (×5): 2 mg via INTRAVENOUS
  Filled 2023-07-08 (×10): qty 1
  Filled 2023-07-08: qty 2
  Filled 2023-07-08 (×4): qty 1

## 2023-07-08 NOTE — Plan of Care (Signed)
  Problem: Education: Goal: Knowledge of General Education information will improve Description: Including pain rating scale, medication(s)/side effects and non-pharmacologic comfort measures Outcome: Not Progressing   Problem: Health Behavior/Discharge Planning: Goal: Ability to manage health-related needs will improve Outcome: Not Progressing   Problem: Clinical Measurements: Goal: Ability to maintain clinical measurements within normal limits will improve Outcome: Not Progressing Goal: Will remain free from infection Outcome: Progressing Goal: Diagnostic test results will improve Outcome: Not Progressing Goal: Respiratory complications will improve Outcome: Not Progressing Goal: Cardiovascular complication will be avoided Outcome: Not Progressing   Problem: Activity: Goal: Risk for activity intolerance will decrease Outcome: Not Progressing   Problem: Nutrition: Goal: Adequate nutrition will be maintained Outcome: Not Progressing   Problem: Coping: Goal: Level of anxiety will decrease Outcome: Not Progressing   Problem: Elimination: Goal: Will not experience complications related to bowel motility Outcome: Not Progressing Goal: Will not experience complications related to urinary retention Outcome: Not Progressing   Problem: Pain Managment: Goal: General experience of comfort will improve Outcome: Not Progressing   Problem: Safety: Goal: Ability to remain free from injury will improve Outcome: Not Progressing   Problem: Skin Integrity: Goal: Risk for impaired skin integrity will decrease Outcome: Not Progressing   Problem: Safety: Goal: Non-violent Restraint(s) Outcome: Not Progressing

## 2023-07-08 NOTE — Progress Notes (Addendum)
Critical care note:  Date of note: 07/08/2023  Subjective: The patient has been very agitated despite IV Ativan doses given with CIWA protocol.  She had restraints placed and was still agitated.  Objective: Physical examination: Generally: Acutely ill middle-aged African-American female.  She was initially agitated and later sedated. Vital signs, BP was 142/123 with heart rate 139 respiratory rate of 23 and temperature 99.2 with pulse oximetry 98% on room air. Head - atraumatic, normocephalic.  Pupils - equal, round and reactive to light and accommodation. Extraocular movements are intact. No scleral icterus.  Oropharynx - moist mucous membranes and tongue. No pharyngeal erythema or exudate.  Neck - supple. No JVD. Carotid pulses 2+ bilaterally. No carotid bruits. No palpable thyromegaly or lymphadenopathy. Cardiovascular - regular rate and rhythm. Normal S1 and S2. No murmurs, gallops or rubs.  Lungs - clear to auscultation bilaterally.  Abdomen - soft and nontender. Positive bowel sounds. No palpable organomegaly or masses.  Extremities - no pitting edema, clubbing or cyanosis.  Neuro - grossly non-focal. Skin - no rashes. Breast, pelvic and rectal - deferred.  Labs and notes were reviewed.  Assessment/plan 1.  Delirium tremens with severe alcohol withdrawal. - The patient was started on IV Precedex drip with significant improvement of her agitation. The latest BP was 116/90 with heart rate of 81 and respiratory to of 20 and pulse currently 99% on room air. - We will continue CIWA protocol. - We will continue to closely monitor her.  2.  Recent hypokalemia. - Potassium will be aggressively replaced.  Authorized and performed by: Valente David, MD Total critical care time:     30   minutes. Due to a high probability of clinically significant, life-threatening deterioration, the patient required my highest level of preparedness to intervene emergently and I personally spent this  critical care time directly and personally managing the patient.  This critical care time included obtaining a history, examining the patient, pulse oximetry, ordering and review of studies, arranging urgent treatment with development of management plan, evaluation of patient's response to treatment, frequent reassessment, and discussions with other providers. This critical care time was performed to assess and manage the high probability of imminent, life-threatening deterioration that could result in multiorgan failure.  It was exclusive of separately billable procedures and treating other patients and teaching time.

## 2023-07-08 NOTE — Progress Notes (Signed)
PROGRESS NOTE    [name]  WGN:[id number] DOB: 01-28-1971 DOA: 07/05/2023 PCP: [facility] Records, FNP   Brief Narrative:    Heidi [name] is a 52 y.o. female with medical history significant of hypertension, alcohol abuse, tobacco use disorder who presents to the emergency department from home via EMS due to complaints of generalized bodyaches, nausea, vomiting which she thinks was related to alcohol withdrawal.  She continues to have signs of alcohol withdrawal and tremors this morning.  She remains on CIWA protocol and requires continuous monitoring in stepdown unit.   Subjective: Comfortable laying in bed this morning, hemodynamically stable. Due to agitation delirium, IV Precedex was initiated overnight.  Assessment & Plan:   Principal Problem:   Alcohol withdrawal (HCC) Active Problems:   Nausea & vomiting   Essential hypertension   Transaminitis   Tobacco use disorder   Hypokalemia   Thrombocytopenia (HCC)  Assessment and Plan:   Alcohol withdrawal POA: Alcohol level was < 10 -Due to excessive delirium, agitation IV Precedex was started overnight 07/08/2023 -Was on Librium which has been discontinued -Continue CIWA protocol, with thiamine and multivitamin Medically stable  Continue fall precaution    Transaminitis    Latest Ref Rng & Units 07/07/2023    5:12 AM 07/06/2023    4:49 AM 07/05/2023    5:25 PM  Hepatic Function  Total Protein 6.5 - 8.1 g/dL 7.1  6.6  8.1   Albumin 3.5 - 5.0 g/dL 3.9  3.6  4.6   AST 15 - 41 U/L 47  64  104   ALT 0 - 44 U/L 44  52  77   Alk Phosphatase 38 - 126 U/L 75  72  96   Total Bilirubin 0.3 - 1.2 mg/dL 1.3  1.3  1.6      --Improving Possibly related to patient's history of alcohol use disorder Continue to monitor liver enzymes Plan to obtain right upper quadrant ultrasound to evaluate for liver cirrhosis   Nausea and vomiting Continue Zofran as needed   Persistent hypokalemia  -Initiate IV fluids with  supplement potassium, along with oral potassium if she is awake enough -Will check magnesium level and replete   thrombocytopenia Likely related to alcohol use Change heparin to SCDs Continue to monitor CBC   Essential hypertension Continue irbesartan and Cardizem   Tobacco use disorder Continue nicotine patch Patient will be counseled on tobacco use disorder cessation when more stable    DVT prophylaxis: SCDs Code Status: Full Family Communication: None at bedside Disposition Plan:  Status is: Inpatient Remains inpatient appropriate because: Need for close monitoring.   Consultants:  None  Procedures:  None  Antimicrobials:  None    Objective: Vitals:   07/08/23 0645 07/08/23 0812 07/08/23 0826 07/08/23 1100  BP: (!) 152/103 (!) 162/105    Pulse:  (!) 59    Resp:  (!) 23    Temp:   98.2 F (36.8 C) 97.8 F (36.6 C)  TempSrc:   Axillary Axillary  SpO2:  98%    Weight:      Height:       No intake or output data in the 24 hours ending 07/08/23 1305  Filed Weights   07/06/23 0536 07/07/23 0522 07/08/23 0500  Weight: 64.8 kg 65.1 kg 65.1 kg    Examination:   General:  Drowsy/sleepy this morning no distress;   HEENT:  Normocephalic, PERRL, otherwise with in Normal limits   Neuro:  CNII-XII intact. , normal  motor and sensation, reflexes intact   Lungs:   Clear to auscultation BL, Respirations unlabored,  No wheezes / crackles  Cardio:    S1/S2, RRR, No murmure, No Rubs or Gallops   Abdomen:  Soft, non-tender, bowel sounds active all four quadrants, no guarding or peritoneal signs.  Muscular  skeletal:  Limited exam -global generalized weaknesses - in bed, able to move all 4 extremities,   2+ pulses,  symmetric, No pitting edema  Skin:  Dry, warm to touch, negative for any Rashes,  Wounds: Please see nursing documentation           Data Reviewed: I have personally reviewed following labs and imaging studies  CBC: Recent Labs  Lab  07/05/23 1725 07/06/23 0449 07/07/23 0512  WBC 5.3 3.9* 5.3  HGB 12.0 10.4* 10.9*  HCT 35.5* 30.4* 31.5*  MCV 90.1 89.9 89.0  PLT 73* 64* 68*   Basic Metabolic Panel: Recent Labs  Lab 07/05/23 1725 07/06/23 0449 07/07/23 0512  NA 141 137 133*  K 3.4* 3.5 2.9*  CL 109 108 102  CO2 18* 20* 23  GLUCOSE 103* 101* 129*  BUN 11 12 6   CREATININE 0.66 0.72 0.53  CALCIUM 10.0 9.0 9.5  MG  --  1.8 1.9  PHOS  --  2.8  --    GFR: Estimated Creatinine Clearance: 77.2 mL/min (by C-G formula based on SCr of 0.53 mg/dL). Liver Function Tests: Recent Labs  Lab 07/05/23 1725 07/06/23 0449 07/07/23 0512  AST 104* 64* 47*  ALT 77* 52* 44  ALKPHOS 96 72 75  BILITOT 1.6* 1.3* 1.3*  PROT 8.1 6.6 7.1  ALBUMIN 4.6 3.6 3.9     Recent Results (from the past 240 hour(s))  MRSA Next Gen by PCR, Nasal     Status: None   Collection Time: 07/05/23 10:10 PM   Specimen: Nasal Mucosa; Nasal Swab  Result Value Ref Range Status   MRSA by PCR Next Gen NOT DETECTED NOT DETECTED Final    Comment: (NOTE) The GeneXpert MRSA Assay (FDA approved for NASAL specimens only), is one component of a comprehensive MRSA colonization surveillance program. It is not intended to diagnose MRSA infection nor to guide or monitor treatment for MRSA infections. Test performance is not FDA approved in patients less than 38 years old. Performed at [facility], [address] [place], [place] [address]   Culture, blood (Routine X 2) w Reflex to ID Panel     Status: None (Preliminary result)   Collection Time: 07/05/23 11:45 PM   Specimen: BLOOD LEFT ARM  Result Value Ref Range Status   Specimen Description BLOOD LEFT ARM  Final   Special Requests   Final    BOTTLES DRAWN AEROBIC AND ANAEROBIC Blood Culture adequate volume   Culture   Final    NO GROWTH 2 DAYS Performed at [facility], [address], [place], [place] [address]    Report Status PENDING  Incomplete  Culture, blood (Routine X 2) w Reflex to ID  Panel     Status: None (Preliminary result)   Collection Time: 07/05/23 11:48 PM   Specimen: BLOOD LEFT HAND  Result Value Ref Range Status   Specimen Description BLOOD LEFT HAND  Final   Special Requests   Final    BOTTLES DRAWN AEROBIC AND ANAEROBIC Blood Culture adequate volume   Culture   Final    NO GROWTH 2 DAYS Performed at [facility], [address] [place], [place] [address]  Report Status PENDING  Incomplete      Scheduled Meds:  chlordiazePOXIDE  10 mg Oral TID   Chlorhexidine Gluconate Cloth  6 each Topical Daily   diltiazem  120 mg Oral Daily   folic acid  1 mg Oral Daily   hydrALAZINE  50 mg Oral Q8H   irbesartan  150 mg Oral Daily   multivitamin with minerals  1 tablet Oral Daily   nicotine  14 mg Transdermal Daily   thiamine  100 mg Oral Daily   Or   thiamine  100 mg Intravenous Daily     LOS: 3 days    Kendell Bane, MD [facility] Greater than 55 minutes of critical time was spent evaluating, reviewing labs, medications, drawn plan of care.  If 7PM-7AM, please contact night-coverage [email or web address]

## 2023-07-08 NOTE — Progress Notes (Signed)
Patient having the urge to void, but was unsuccessful. Bladder scan completed showing almost 600 mls. Dr. Flossie Dibble notified.

## 2023-07-08 NOTE — Plan of Care (Signed)
  Problem: Education: Goal: Knowledge of General Education information will improve Description: Including pain rating scale, medication(s)/side effects and non-pharmacologic comfort measures Outcome: Not Progressing   Problem: Clinical Measurements: Goal: Ability to maintain clinical measurements within normal limits will improve Outcome: Not Progressing   Problem: Activity: Goal: Risk for activity intolerance will decrease Outcome: Not Progressing   Problem: Nutrition: Goal: Adequate nutrition will be maintained Outcome: Not Progressing   Problem: Coping: Goal: Level of anxiety will decrease Outcome: Progressing

## 2023-07-09 ENCOUNTER — Inpatient Hospital Stay (HOSPITAL_COMMUNITY): Payer: Medicaid Other

## 2023-07-09 DIAGNOSIS — E861 Hypovolemia: Secondary | ICD-10-CM | POA: Diagnosis not present

## 2023-07-09 DIAGNOSIS — T68XXXA Hypothermia, initial encounter: Secondary | ICD-10-CM | POA: Diagnosis not present

## 2023-07-09 DIAGNOSIS — F10931 Alcohol use, unspecified with withdrawal delirium: Secondary | ICD-10-CM | POA: Diagnosis not present

## 2023-07-09 LAB — CULTURE, BLOOD (ROUTINE X 2)

## 2023-07-09 LAB — COMPREHENSIVE METABOLIC PANEL
ALT: 40 U/L (ref 0–44)
AST: 42 U/L — ABNORMAL HIGH (ref 15–41)
Albumin: 3.7 g/dL (ref 3.5–5.0)
Alkaline Phosphatase: 67 U/L (ref 38–126)
Anion gap: 8 (ref 5–15)
BUN: 5 mg/dL — ABNORMAL LOW (ref 6–20)
CO2: 20 mmol/L — ABNORMAL LOW (ref 22–32)
Calcium: 9.9 mg/dL (ref 8.9–10.3)
Chloride: 113 mmol/L — ABNORMAL HIGH (ref 98–111)
Creatinine, Ser: 0.32 mg/dL — ABNORMAL LOW (ref 0.44–1.00)
GFR, Estimated: >60 mL/min (ref >60)
Glucose, Bld: 119 mg/dL — ABNORMAL HIGH (ref 70–99)
Potassium: 3.7 mmol/L (ref 3.5–5.1)
Sodium: 141 mmol/L (ref 135–145)
Total Bilirubin: 1.2 mg/dL (ref 0.3–1.2)
Total Protein: 7.1 g/dL (ref 6.5–8.1)

## 2023-07-09 LAB — CBC
HCT: 36 % (ref 36.0–46.0)
Hemoglobin: 12 g/dL (ref 12.0–15.0)
MCH: 30.9 pg (ref 26.0–34.0)
MCHC: 33.3 g/dL (ref 30.0–36.0)
MCV: 92.8 fL (ref 80.0–100.0)
Platelets: 68 10*3/uL — ABNORMAL LOW (ref 150–400)
RBC: 3.88 MIL/uL (ref 3.87–5.11)
RDW: 14.9 % (ref 11.5–15.5)
WBC: 6.3 10*3/uL (ref 4.0–10.5)
nRBC: 0 % (ref 0.0–0.2)

## 2023-07-09 LAB — PROCALCITONIN: Procalcitonin: 0.94 ng/mL

## 2023-07-09 LAB — LACTIC ACID, PLASMA: Lactic Acid, Venous: 0.8 mmol/L (ref 0.5–1.9)

## 2023-07-09 MED ORDER — POTASSIUM CHLORIDE CRYS ER 20 MEQ PO TBCR
EXTENDED_RELEASE_TABLET | ORAL | Status: AC
  Filled 2023-07-09: qty 2

## 2023-07-09 MED ORDER — POLYETHYLENE GLYCOL 3350 17 G PO PACK
17.0000 g | PACK | Freq: Every day | ORAL | Status: DC
  Administered 2023-07-09 – 2023-07-15 (×4): 17 g via ORAL
  Filled 2023-07-09 (×4): qty 1

## 2023-07-09 MED ORDER — POTASSIUM CHLORIDE CRYS ER 20 MEQ PO TBCR: 40.0000 meq | EXTENDED_RELEASE_TABLET | Freq: Once | ORAL | Status: DC

## 2023-07-09 MED ORDER — MAGNESIUM SULFATE 2 GM/50ML IV SOLN
2.0000 g | Freq: Once | INTRAVENOUS | Status: AC
  Administered 2023-07-09: 2 g via INTRAVENOUS

## 2023-07-09 MED ORDER — SENNOSIDES-DOCUSATE SODIUM 8.6-50 MG PO TABS
1.0000 | ORAL_TABLET | Freq: Two times a day (BID) | ORAL | Status: DC
  Administered 2023-07-09 – 2023-07-15 (×6): 1 via ORAL
  Filled 2023-07-09 (×6): qty 1

## 2023-07-09 MED ORDER — KCL IN DEXTROSE-NACL 20-5-0.45 MEQ/L-%-% IV SOLN: INTRAVENOUS | Status: DC

## 2023-07-09 MED ORDER — POLYETHYLENE GLYCOL 3350 17 G PO PACK
PACK | ORAL | Status: AC
  Filled 2023-07-09: qty 1

## 2023-07-09 MED ORDER — MAGNESIUM SULFATE 2 GM/50ML IV SOLN
INTRAVENOUS | Status: AC
  Filled 2023-07-09: qty 50

## 2023-07-09 NOTE — Plan of Care (Signed)
  Problem: Nutrition: Goal: Adequate nutrition will be maintained Outcome: Progressing   Problem: Pain Managment: Goal: General experience of comfort will improve Outcome: Progressing   Problem: Safety: Goal: Ability to remain free from injury will improve Outcome: Progressing   Problem: Education: Goal: Knowledge of General Education information will improve Description: Including pain rating scale, medication(s)/side effects and non-pharmacologic comfort measures Outcome: Not Progressing   Problem: Health Behavior/Discharge Planning: Goal: Ability to manage health-related needs will improve Outcome: Not Progressing   Problem: Clinical Measurements: Goal: Ability to maintain clinical measurements within normal limits will improve Outcome: Not Progressing   Problem: Activity: Goal: Risk for activity intolerance will decrease Outcome: Not Progressing   Problem: Coping: Goal: Level of anxiety will decrease Outcome: Not Progressing   Problem: Elimination: Goal: Will not experience complications related to urinary retention Outcome: Not Progressing   Problem: Safety: Goal: Non-violent Restraint(s) Outcome: Not Progressing

## 2023-07-09 NOTE — Progress Notes (Signed)
@  1800 rectal temp with hypothermic warming blanket on was 98.8 rectally. Turned warming blanket machine to monitor temp only and now at 1847, rectal temp is 97.5. Turned hypothermic warming blanket machine back on. Will continue to monitor.

## 2023-07-09 NOTE — Progress Notes (Signed)
PROGRESS NOTE    Heidi [name]  NGE:[id number] DOB: 01-03-1971 DOA: 07/05/2023 PCP: [facility] Records, FNP   Brief Narrative:    Heidi [name] is a 52 y.o. female with medical history significant of hypertension, alcohol abuse, tobacco use disorder who presents to the emergency department from home via EMS due to complaints of generalized bodyaches, nausea, vomiting which she thinks was related to alcohol withdrawal.  She continues to have signs of alcohol withdrawal and tremors this morning.  She remains on CIWA protocol and requires continuous monitoring in stepdown unit.   Subjective: Patient was seen and examined this AM- Comfortable laying in bed this morning, low BP 88/71  Improved agitation delirium, On PRN Ativan and IV Precedex   Assessment & Plan:   Principal Problem:   Alcohol withdrawal (HCC) Active Problems:   Nausea & vomiting   Essential hypertension   Transaminitis   Tobacco use disorder   Hypokalemia   Thrombocytopenia (HCC)  Assessment and Plan:   Alcohol withdrawal  POA: Alcohol level was < 10 -Improved agitation Delirium, On PRN Ativan and IV Precedex  - Precedex was started overnight 07/08/2023 - Librium has been discontinued -Continue CIWA protocol, with thiamine and multivitamin Medically stable  Continue fall precaution    Transaminitis    Latest Ref Rng & Units 07/09/2023    7:57 AM 07/07/2023    5:12 AM 07/06/2023    4:49 AM  Hepatic Function  Total Protein 6.5 - 8.1 g/dL 7.1  7.1  6.6   Albumin 3.5 - 5.0 g/dL 3.7  3.9  3.6   AST 15 - 41 U/L 42  47  64   ALT 0 - 44 U/L 40  44  52   Alk Phosphatase 38 - 126 U/L 67  75  72   Total Bilirubin 0.3 - 1.2 mg/dL 1.2  1.3  1.3      --Improving Possibly related to patient's history of alcohol use disorder Continue to monitor liver enzymes Plan to obtain right upper quadrant ultrasound to evaluate for liver cirrhosis   Nausea and vomiting Continue Zofran as needed   Persistent  hypokalemia  -Initiate IV fluids with supplement potassium, along with oral potassium if she is awake enough -Will check magnesium level and replete   Thrombocytopenia Likely related to alcohol use Change heparin to SCDs Continue to monitor CBC   Hypothermia  WBC: 6.3, LA 0.8, Procal: 0.94, CXR - WNL  No Abx for now  Will  Check UA     Essential hypertension - Hypotensive  Holding  irbesartan and Cardizem   Tobacco use disorder Continue nicotine patch Patient will be counseled on tobacco use disorder cessation when more stable    DVT prophylaxis: SCDs Code Status: Full Family Communication: None at bedside Disposition Plan:  Status is: Inpatient Remains inpatient appropriate because: Need for close monitoring.   Consultants:  None  Procedures:  None  Antimicrobials:  None    Objective: Vitals:   07/09/23 1100 07/09/23 1211 07/09/23 1259 07/09/23 1300  BP: (!) 88/71   (!) 107/57  Pulse: 73  80   Resp: 20  20 20   Temp: (!) 92.3 F (33.5 C) (!) 92.9 F (33.8 C)    TempSrc: Rectal Rectal    SpO2: 99% 99% 100%   Weight:      Height:        Intake/Output Summary (Last 24 hours) at 07/09/2023 1309 Last data filed at 07/09/2023 1307 Gross per 24 hour  Intake 1140.88 ml  Output 2050 ml  Net -909.12 ml    Filed Weights   07/06/23 0536 07/07/23 0522 07/08/23 0500  Weight: 64.8 kg 65.1 kg 65.1 kg        General:  Improved agitation delirium, Sleepy   HEENT:  Normocephalic, PERRL, otherwise with in Normal limits   Neuro:  CNII-XII intact. , normal motor and sensation, reflexes intact   Lungs:   Clear to auscultation BL, Respirations unlabored,  No wheezes / crackles  Cardio:    S1/S2, RRR, No murmure, No Rubs or Gallops   Abdomen:  Soft, non-tender, bowel sounds active all four quadrants, no guarding or peritoneal signs.  Muscular  skeletal:  Limited exam -global generalized weaknesses - in bed, able to move all 4 extremities,   2+ pulses,   symmetric, No pitting edema  Skin:  Dry, warm to touch, negative for any Rashes,  Wounds: Please see nursing documentation           Data Reviewed: I have personally reviewed following labs and imaging studies  CBC: Recent Labs  Lab 07/05/23 1725 07/06/23 0449 07/07/23 0512 07/09/23 0757  WBC 5.3 3.9* 5.3 6.3  HGB 12.0 10.4* 10.9* 12.0  HCT 35.5* 30.4* 31.5* 36.0  MCV 90.1 89.9 89.0 92.8  PLT 73* 64* 68* 68*   Basic Metabolic Panel: Recent Labs  Lab 07/05/23 1725 07/06/23 0449 07/07/23 0512 07/09/23 0757  NA 141 137 133* 141  K 3.4* 3.5 2.9* 3.7  CL 109 108 102 113*  CO2 18* 20* 23 20*  GLUCOSE 103* 101* 129* 119*  BUN 11 12 6  5*  CREATININE 0.66 0.72 0.53 0.32*  CALCIUM 10.0 9.0 9.5 9.9  MG  --  1.8 1.9  --   PHOS  --  2.8  --   --    GFR: Estimated Creatinine Clearance: 77.2 mL/min (A) (by C-G formula based on SCr of 0.32 mg/dL (L)). Liver Function Tests: Recent Labs  Lab 07/05/23 1725 07/06/23 0449 07/07/23 0512 07/09/23 0757  AST 104* 64* 47* 42*  ALT 77* 52* 44 40  ALKPHOS 96 72 75 67  BILITOT 1.6* 1.3* 1.3* 1.2  PROT 8.1 6.6 7.1 7.1  ALBUMIN 4.6 3.6 3.9 3.7     Recent Results (from the past 240 hour(s))  MRSA Next Gen by PCR, Nasal     Status: None   Collection Time: 07/05/23 10:10 PM   Specimen: Nasal Mucosa; Nasal Swab  Result Value Ref Range Status   MRSA by PCR Next Gen NOT DETECTED NOT DETECTED Final    Comment: (NOTE) The GeneXpert MRSA Assay (FDA approved for NASAL specimens only), is one component of a comprehensive MRSA colonization surveillance program. It is not intended to diagnose MRSA infection nor to guide or monitor treatment for MRSA infections. Test performance is not FDA approved in patients less than 84 years old. Performed at [facility], [address] [place], [place] [address]   Culture, blood (Routine X 2) w Reflex to ID Panel     Status: None (Preliminary result)   Collection Time: 07/05/23 11:45 PM    Specimen: BLOOD LEFT ARM  Result Value Ref Range Status   Specimen Description BLOOD LEFT ARM  Final   Special Requests   Final    BOTTLES DRAWN AEROBIC AND ANAEROBIC Blood Culture adequate volume   Culture   Final    NO GROWTH 3 DAYS Performed at [facility], [address] [place], [place] [address]  Report Status PENDING  Incomplete  Culture, blood (Routine X 2) w Reflex to ID Panel     Status: None (Preliminary result)   Collection Time: 07/05/23 11:48 PM   Specimen: BLOOD LEFT HAND  Result Value Ref Range Status   Specimen Description BLOOD LEFT HAND  Final   Special Requests   Final    BOTTLES DRAWN AEROBIC AND ANAEROBIC Blood Culture adequate volume   Culture   Final    NO GROWTH 3 DAYS Performed at [facility], [address] [place], [place] [address]    Report Status PENDING  Incomplete      Scheduled Meds:  Chlorhexidine Gluconate Cloth  6 each Topical Daily   diltiazem  120 mg Oral Daily   folic acid  1 mg Oral Daily   irbesartan  150 mg Oral Daily   multivitamin with minerals  1 tablet Oral Daily   nicotine  14 mg Transdermal Daily   polyethylene glycol  17 g Oral Daily   senna-docusate  1 tablet Oral BID   thiamine  100 mg Oral Daily   Or   thiamine  100 mg Intravenous Daily     LOS: 4 days    Kendell Bane, MD [facility] Greater than 55 minutes of critical time was spent evaluating, reviewing labs, medications, drawn plan of care.  If 7PM-7AM, please contact night-coverage [email or web address]

## 2023-07-09 NOTE — Plan of Care (Signed)

## 2023-07-09 NOTE — Progress Notes (Signed)
Patient felt cold to touch, rectal temp done with result of 89.7. Dr Flossie Dibble made aware. Patient alert with eyes open, mumbling and alert to self only.

## 2023-07-10 DIAGNOSIS — F10931 Alcohol use, unspecified with withdrawal delirium: Secondary | ICD-10-CM | POA: Diagnosis not present

## 2023-07-10 LAB — COMPREHENSIVE METABOLIC PANEL
ALT: 32 U/L (ref 0–44)
AST: 34 U/L (ref 15–41)
Albumin: 3.4 g/dL — ABNORMAL LOW (ref 3.5–5.0)
Alkaline Phosphatase: 65 U/L (ref 38–126)
Anion gap: 6 (ref 5–15)
BUN: 7 mg/dL (ref 6–20)
CO2: 20 mmol/L — ABNORMAL LOW (ref 22–32)
Calcium: 9.6 mg/dL (ref 8.9–10.3)
Chloride: 116 mmol/L — ABNORMAL HIGH (ref 98–111)
Creatinine, Ser: 0.52 mg/dL (ref 0.44–1.00)
GFR, Estimated: >60 mL/min (ref >60)
Glucose, Bld: 109 mg/dL — ABNORMAL HIGH (ref 70–99)
Potassium: 4 mmol/L (ref 3.5–5.1)
Sodium: 142 mmol/L (ref 135–145)
Total Bilirubin: 0.9 mg/dL (ref 0.3–1.2)
Total Protein: 6.8 g/dL (ref 6.5–8.1)

## 2023-07-10 LAB — CBC
HCT: 33.4 % — ABNORMAL LOW (ref 36.0–46.0)
Hemoglobin: 10.7 g/dL — ABNORMAL LOW (ref 12.0–15.0)
MCH: 30.1 pg (ref 26.0–34.0)
MCHC: 32 g/dL (ref 30.0–36.0)
MCV: 94.1 fL (ref 80.0–100.0)
Platelets: 56 10*3/uL — ABNORMAL LOW (ref 150–400)
RBC: 3.55 MIL/uL — ABNORMAL LOW (ref 3.87–5.11)
RDW: 15 % (ref 11.5–15.5)
WBC: 4.4 10*3/uL (ref 4.0–10.5)
nRBC: 0 % (ref 0.0–0.2)

## 2023-07-10 LAB — CULTURE, BLOOD (ROUTINE X 2)
Culture: NO GROWTH
Culture: NO GROWTH

## 2023-07-10 LAB — GLUCOSE, CAPILLARY: Glucose-Capillary: 92 mg/dL (ref 70–99)

## 2023-07-10 MED ORDER — DEXTROSE-SODIUM CHLORIDE 5-0.45 % IV SOLN: INTRAVENOUS | Status: DC

## 2023-07-10 NOTE — Progress Notes (Signed)
PROGRESS NOTE    BRADEN [name]  GUY:[id number] DOB: [birth year] DOA: 07/05/2023 PCP: Heidi Records, FNP   Brief Narrative:    Heidi [name] is a 52 y.o. female with medical history significant of hypertension, alcohol abuse, tobacco use disorder who presents to the emergency department from home via EMS due to complaints of generalized bodyaches, nausea, vomiting which she thinks was related to alcohol withdrawal.  She continues to have signs of alcohol withdrawal and tremors this morning.  She remains on CIWA protocol and requires continuous monitoring in stepdown unit.   Subjective: The patient was seen and examined this morning, stable no acute distress, somnolent but arousable, following commands   Assessment & Plan:   Principal Problem:   Alcohol withdrawal (HCC) Active Problems:   Nausea & vomiting   Essential hypertension   Transaminitis   Tobacco use disorder   Hypokalemia   Thrombocytopenia (HCC)   Hypothermia  Assessment and Plan:   Alcohol withdrawal -Improved agitation, delirium, on CIWA protocol Ativan and IV Precedex Which has been started on 07/08/2023 -Librium discontinued -Tapering off Precedex  POA: Alcohol level was < 10   -Continuing thiamine and multivitamin   Continue fall precaution    Transaminitis    Latest Ref Rng & Units 07/10/2023    4:47 AM 07/09/2023    7:57 AM 07/07/2023    5:12 AM  Hepatic Function  Total Protein 6.5 - 8.1 g/dL 6.8  7.1  7.1   Albumin 3.5 - 5.0 g/dL 3.4  3.7  3.9   AST 15 - 41 U/L 34  42  47   ALT 0 - 44 U/L 32  40  44   Alk Phosphatase 38 - 126 U/L 65  67  75   Total Bilirubin 0.3 - 1.2 mg/dL 0.9  1.2  1.3      --Improving Possibly related to patient's history of alcohol use disorder Continue to monitor liver enzymes Plan to obtain right upper quadrant ultrasound to evaluate for liver cirrhosis   Nausea and vomiting Poor p.o. intake due to somnolence Continue Zofran as needed Encouraging  p.o. intake   Persistent hypokalemia  -Resolved potassium was repleted with IV and p.o.    Thrombocytopenia Likely related to alcohol use -Platelets 68, 56 today Change heparin to SCDs Continue to monitor CBC   Hypothermia  WBC: 6.3, LA 0.8, Procal: 0.94, CXR - WNL  No Abx for now  Will  Check UA -normal limits    Essential hypertension - Hypotensive  Holding  irbesartan and Cardizem   Tobacco use disorder Continue nicotine patch Patient will be counseled on tobacco use disorder cessation when more stable    DVT prophylaxis: SCDs Code Status: Full Family Communication: None at bedside Disposition Plan:  Status is: Inpatient Remains inpatient appropriate because: Need for close monitoring.   Consultants:  None  Procedures:  None  Antimicrobials:  None    Objective: Vitals:   07/10/23 1000 07/10/23 1030 07/10/23 1100 07/10/23 1121  BP: 122/87 (!) 134/97 (!) 124/90   Pulse: 91 (!) 105 91   Resp: (!) 23 (!) 29 19   Temp:    (!) (P) 97.4 F (36.3 C)  TempSrc:    (P) Oral  SpO2: 99% 100% 96%   Weight:      Height:        Intake/Output Summary (Last 24 hours) at 07/10/2023 1159 Last data filed at 07/10/2023 0955 Gross per 24 hour  Intake 1380.63 ml  Output  1900 ml  Net -519.37 ml    Filed Weights   07/07/23 0522 07/08/23 0500 07/10/23 0502  Weight: 65.1 kg 65.1 kg 66.6 kg        General:  Somnolent but easily arousable, following commands  HEENT:  Normocephalic, PERRL, otherwise with in Normal limits   Neuro:  CNII-XII intact. , normal motor and sensation, reflexes intact   Lungs:   Clear to auscultation BL, Respirations unlabored,  No wheezes / crackles  Cardio:    S1/S2, RRR, No murmure, No Rubs or Gallops   Abdomen:  Soft, non-tender, bowel sounds active all four quadrants, no guarding or peritoneal signs.  Muscular  skeletal:  Limited exam -global generalized weaknesses - in bed, able to move all 4 extremities,   2+ pulses,   symmetric, No pitting edema  Skin:  Dry, warm to touch, negative for any Rashes,  Wounds: Please see nursing documentation           Data Reviewed: I have personally reviewed following labs and imaging studies  CBC: Recent Labs  Lab 07/05/23 1725 07/06/23 0449 07/07/23 0512 07/09/23 0757 07/10/23 0447  WBC 5.3 3.9* 5.3 6.3 4.4  HGB 12.0 10.4* 10.9* 12.0 10.7*  HCT 35.5* 30.4* 31.5* 36.0 33.4*  MCV 90.1 89.9 89.0 92.8 94.1  PLT 73* 64* 68* 68* 56*   Basic Metabolic Panel: Recent Labs  Lab 07/05/23 1725 07/06/23 0449 07/07/23 0512 07/09/23 0757 07/10/23 0447  NA 141 137 133* 141 142  K 3.4* 3.5 2.9* 3.7 4.0  CL 109 108 102 113* 116*  CO2 18* 20* 23 20* 20*  GLUCOSE 103* 101* 129* 119* 109*  BUN 11 12 6  5* 7  CREATININE 0.66 0.72 0.53 0.32* 0.52  CALCIUM 10.0 9.0 9.5 9.9 9.6  MG  --  1.8 1.9  --   --   PHOS  --  2.8  --   --   --    GFR: Estimated Creatinine Clearance: 78 mL/min (by C-G formula based on SCr of 0.52 mg/dL). Liver Function Tests: Recent Labs  Lab 07/05/23 1725 07/06/23 0449 07/07/23 0512 07/09/23 0757 07/10/23 0447  AST 104* 64* 47* 42* 34  ALT 77* 52* 44 40 32  ALKPHOS 96 72 75 67 65  BILITOT 1.6* 1.3* 1.3* 1.2 0.9  PROT 8.1 6.6 7.1 7.1 6.8  ALBUMIN 4.6 3.6 3.9 3.7 3.4*     Recent Results (from the past 240 hour(s))  MRSA Next Gen by PCR, Nasal     Status: None   Collection Time: 07/05/23 10:10 PM   Specimen: Nasal Mucosa; Nasal Swab  Result Value Ref Range Status   MRSA by PCR Next Gen NOT DETECTED NOT DETECTED Final    Comment: (NOTE) The GeneXpert MRSA Assay (FDA approved for NASAL specimens only), is one component of a comprehensive MRSA colonization surveillance program. It is not intended to diagnose MRSA infection nor to guide or monitor treatment for MRSA infections. Test performance is not FDA approved in patients less than 33 years old. Performed at [facility], [address], [place], [place] [address]   Culture,  blood (Routine X 2) w Reflex to ID Panel     Status: None (Preliminary result)   Collection Time: 07/05/23 11:45 PM   Specimen: BLOOD LEFT ARM  Result Value Ref Range Status   Specimen Description BLOOD LEFT ARM  Final   Special Requests   Final    BOTTLES DRAWN AEROBIC AND ANAEROBIC Blood Culture adequate volume  Culture   Final    NO GROWTH 4 DAYS Performed at [facility], [address] [place], [place] [address]    Report Status PENDING  Incomplete  Culture, blood (Routine X 2) w Reflex to ID Panel     Status: None (Preliminary result)   Collection Time: 07/05/23 11:48 PM   Specimen: BLOOD LEFT HAND  Result Value Ref Range Status   Specimen Description BLOOD LEFT HAND  Final   Special Requests   Final    BOTTLES DRAWN AEROBIC AND ANAEROBIC Blood Culture adequate volume   Culture   Final    NO GROWTH 4 DAYS Performed at [facility], [address] [place], [place] [address]    Report Status PENDING  Incomplete  Culture, blood (Routine X 2) w Reflex to ID Panel     Status: None (Preliminary result)   Collection Time: 07/09/23  9:09 AM   Specimen: BLOOD  Result Value Ref Range Status   Specimen Description BLOOD LEFT ASSIST CONTROL  Final   Special Requests   Final    BOTTLES DRAWN AEROBIC AND ANAEROBIC Blood Culture adequate volume   Culture   Final    NO GROWTH < 24 HOURS Performed at [facility], [address] [place], [place] [address]    Report Status PENDING  Incomplete  Culture, blood (Routine X 2) w Reflex to ID Panel     Status: None (Preliminary result)   Collection Time: 07/09/23  9:09 AM   Specimen: BLOOD  Result Value Ref Range Status   Specimen Description BLOOD LEFT HAND  Final   Special Requests   Final    BOTTLES DRAWN AEROBIC AND ANAEROBIC Blood Culture adequate volume   Culture   Final    NO GROWTH < 24 HOURS Performed at [facility], [address], [place], [place] [address]    Report Status PENDING  Incomplete      Scheduled Meds:   Chlorhexidine Gluconate Cloth  6 each Topical Daily   diltiazem  120 mg Oral Daily   folic acid  1 mg Oral Daily   multivitamin with minerals  1 tablet Oral Daily   nicotine  14 mg Transdermal Daily   polyethylene glycol  17 g Oral Daily   senna-docusate  1 tablet Oral BID   thiamine  100 mg Oral Daily   Or   thiamine  100 mg Intravenous Daily     LOS: 5 days    Kendell Bane, MD [facility] Greater than 55 minutes of critical time was spent evaluating, reviewing labs, medications, drawn plan of care.  If 7PM-7AM, please contact night-coverage [email or web address]

## 2023-07-10 NOTE — Plan of Care (Signed)
  Problem: Education: Goal: Knowledge of General Education information will improve Description: Including pain rating scale, medication(s)/side effects and non-pharmacologic comfort measures Outcome: Not Progressing   Problem: Health Behavior/Discharge Planning: Goal: Ability to manage health-related needs will improve Outcome: Not Progressing   Problem: Activity: Goal: Risk for activity intolerance will decrease Outcome: Not Progressing   Problem: Elimination: Goal: Will not experience complications related to urinary retention Outcome: Not Progressing   Problem: Safety: Goal: Ability to remain free from injury will improve Outcome: Not Progressing   Problem: Skin Integrity: Goal: Risk for impaired skin integrity will decrease Outcome: Not Progressing   Problem: Safety: Goal: Non-violent Restraint(s) Outcome: Not Progressing

## 2023-07-10 NOTE — Plan of Care (Signed)
  Problem: Clinical Measurements: Goal: Ability to maintain clinical measurements within normal limits will improve Outcome: Progressing   Problem: Clinical Measurements: Goal: Will remain free from infection Outcome: Progressing   Problem: Clinical Measurements: Goal: Diagnostic test results will improve Outcome: Progressing   Problem: Clinical Measurements: Goal: Respiratory complications will improve Outcome: Progressing   Problem: Clinical Measurements: Goal: Cardiovascular complication will be avoided Outcome: Progressing   Problem: Activity: Goal: Risk for activity intolerance will decrease Outcome: Progressing   Problem: Nutrition: Goal: Adequate nutrition will be maintained Outcome: Progressing   Problem: Coping: Goal: Level of anxiety will decrease Outcome: Progressing   Problem: Pain Managment: Goal: General experience of comfort will improve Outcome: Progressing   Problem: Safety: Goal: Ability to remain free from injury will improve Outcome: Progressing   Problem: Safety: Goal: Non-violent Restraint(s) Outcome: Progressing   Problem: Education: Goal: Knowledge of General Education information will improve Description: Including pain rating scale, medication(s)/side effects and non-pharmacologic comfort measures Outcome: Not Progressing   Problem: Health Behavior/Discharge Planning: Goal: Ability to manage health-related needs will improve Outcome: Not Progressing

## 2023-07-11 DIAGNOSIS — F10931 Alcohol use, unspecified with withdrawal delirium: Secondary | ICD-10-CM | POA: Diagnosis not present

## 2023-07-11 LAB — COMPREHENSIVE METABOLIC PANEL
ALT: 27 U/L (ref 0–44)
AST: 22 U/L (ref 15–41)
Albumin: 3.2 g/dL — ABNORMAL LOW (ref 3.5–5.0)
Alkaline Phosphatase: 63 U/L (ref 38–126)
Anion gap: 6 (ref 5–15)
BUN: <5 mg/dL — ABNORMAL LOW (ref 6–20)
CO2: 19 mmol/L — ABNORMAL LOW (ref 22–32)
Calcium: 9.2 mg/dL (ref 8.9–10.3)
Chloride: 111 mmol/L (ref 98–111)
Creatinine, Ser: 0.45 mg/dL (ref 0.44–1.00)
GFR, Estimated: >60 mL/min (ref >60)
Glucose, Bld: 115 mg/dL — ABNORMAL HIGH (ref 70–99)
Potassium: 3.1 mmol/L — ABNORMAL LOW (ref 3.5–5.1)
Sodium: 136 mmol/L (ref 135–145)
Total Bilirubin: 0.9 mg/dL (ref 0.3–1.2)
Total Protein: 6.7 g/dL (ref 6.5–8.1)

## 2023-07-11 LAB — CULTURE, BLOOD (ROUTINE X 2)
Culture: NO GROWTH
Special Requests: ADEQUATE
Special Requests: ADEQUATE

## 2023-07-11 LAB — CBC
HCT: 33.4 % — ABNORMAL LOW (ref 36.0–46.0)
Hemoglobin: 11 g/dL — ABNORMAL LOW (ref 12.0–15.0)
MCH: 30.4 pg (ref 26.0–34.0)
MCHC: 32.9 g/dL (ref 30.0–36.0)
MCV: 92.3 fL (ref 80.0–100.0)
Platelets: 105 10*3/uL — ABNORMAL LOW (ref 150–400)
RBC: 3.62 MIL/uL — ABNORMAL LOW (ref 3.87–5.11)
RDW: 14.6 % (ref 11.5–15.5)
WBC: 4.3 10*3/uL (ref 4.0–10.5)
nRBC: 0 % (ref 0.0–0.2)

## 2023-07-11 LAB — GLUCOSE, CAPILLARY: Glucose-Capillary: 126 mg/dL — ABNORMAL HIGH (ref 70–99)

## 2023-07-11 LAB — MAGNESIUM: Magnesium: 1.7 mg/dL (ref 1.7–2.4)

## 2023-07-11 MED ORDER — POTASSIUM CHLORIDE CRYS ER 20 MEQ PO TBCR
40.0000 meq | EXTENDED_RELEASE_TABLET | Freq: Once | ORAL | Status: AC
  Administered 2023-07-11: 40 meq via ORAL
  Filled 2023-07-11: qty 2

## 2023-07-11 NOTE — Plan of Care (Signed)
  Problem: Health Behavior/Discharge Planning: Goal: Ability to manage health-related needs will improve Outcome: Progressing   Problem: Activity: Goal: Risk for activity intolerance will decrease Outcome: Progressing   Problem: Nutrition: Goal: Adequate nutrition will be maintained Outcome: Progressing   Problem: Safety: Goal: Ability to remain free from injury will improve Outcome: Progressing   Problem: Skin Integrity: Goal: Risk for impaired skin integrity will decrease Outcome: Progressing   Problem: Safety: Goal: Non-violent Restraint(s) Outcome: Progressing

## 2023-07-11 NOTE — Progress Notes (Signed)
PROGRESS NOTE    Heidi [name]  NGE:[id number] DOB: 1971/11/06 DOA: 07/05/2023 PCP: Rica Records, FNP   Brief Narrative:    Heidi [name] is a 52 y.o. female with medical history significant of hypertension, alcohol abuse, tobacco use disorder who presents to the emergency department from home via EMS due to complaints of generalized bodyaches, nausea, vomiting which she thinks was related to alcohol withdrawal.  She continues to have signs of alcohol withdrawal and tremors this morning.  She remains on CIWA protocol and requires continuous monitoring in stepdown unit.   Subjective: The patient was seen and examined this morning, hemodynamically stable, sleepy but arousable, following command, Per nursing staff less agitated today.  Still hallucinating  Assessment & Plan:   Principal Problem:   Alcohol withdrawal (HCC) Active Problems:   Nausea & vomiting   Essential hypertension   Transaminitis   Tobacco use disorder   Hypokalemia   Thrombocytopenia (HCC)   Hypothermia  Assessment and Plan:   Alcohol withdrawal -Improved agitation, delirium,--- still hallucinating -Continue on CIWA protocol Ativan and  -Continue IV Precedex -Which has been started on 07/08/2023 -Librium discontinued -Tapering off Precedex  POA: Alcohol level was < 10   -Continuing thiamine and multivitamin   Continue fall precaution    Transaminitis    Latest Ref Rng & Units 07/11/2023    4:26 AM 07/10/2023    4:47 AM 07/09/2023    7:57 AM  Hepatic Function  Total Protein 6.5 - 8.1 g/dL 6.7  6.8  7.1   Albumin 3.5 - 5.0 g/dL 3.2  3.4  3.7   AST 15 - 41 U/L 22  34  42   ALT 0 - 44 U/L 27  32  40   Alk Phosphatase 38 - 126 U/L 63  65  67   Total Bilirubin 0.3 - 1.2 mg/dL 0.9  0.9  1.2      --Improving Possibly related to patient's history of alcohol use disorder Continue to monitor liver enzymes Plan to obtain right upper quadrant ultrasound to evaluate for liver  cirrhosis   Nausea and vomiting Improving, encouraging p.o. intake   Persistent hypokalemia  -Resolved potassium was repleted with IV and p.o.    Thrombocytopenia Likely related to alcohol use -Platelets 68, 56... 105  Change heparin to SCDs Continue to monitor CBC   Hypothermia  WBC: 6.3, LA 0.8, Procal: 0.94, CXR - WNL  No Abx for now  Will  Check UA -normal limits    Essential hypertension - Hypotensive  Holding  irbesartan and Cardizem -Blood pressure has stabilized   Tobacco use disorder Continue nicotine patch Patient will be counseled on tobacco use disorder cessation when more stable    DVT prophylaxis: SCDs Code Status: Full Family Communication: None at bedside Disposition Plan:  Status is: Inpatient Remains inpatient appropriate because: Need for close monitoring.   Consultants:  None  Procedures:  None  Antimicrobials:  None    Objective: Vitals:   07/11/23 0800 07/11/23 0900 07/11/23 0946 07/11/23 1000  BP: (!) 121/90 114/71 113/87 120/88  Pulse: 98 97  86  Resp: (!) 22 (!) 23  (!) 28  Temp:      TempSrc:      SpO2: 99% 97%  99%  Weight:      Height:        Intake/Output Summary (Last 24 hours) at 07/11/2023 1012 Last data filed at 07/11/2023 0902 Gross per 24 hour  Intake 3168.03 ml  Output  1150 ml  Net 2018.03 ml    Filed Weights   07/08/23 0500 07/10/23 0502 07/11/23 0441  Weight: 65.1 kg 66.6 kg 67 kg    General:  More awake today, sleepy arousable, following commands  HEENT:  Normocephalic, PERRL, otherwise with in Normal limits   Neuro:  CNII-XII intact. , normal motor and sensation, reflexes intact   Lungs:   Clear to auscultation BL, Respirations unlabored,  No wheezes / crackles  Cardio:    S1/S2, RRR, No murmure, No Rubs or Gallops   Abdomen:  Soft, non-tender, bowel sounds active all four quadrants, no guarding or peritoneal signs.  Muscular  skeletal:  Limited exam -global generalized weaknesses - in bed,  able to move all 4 extremities,   2+ pulses,  symmetric, No pitting edema  Skin:  Dry, warm to touch, negative for any Rashes,  Wounds: Please see nursing documentation      Data Reviewed: I have personally reviewed following labs and imaging studies  CBC: Recent Labs  Lab 07/06/23 0449 07/07/23 0512 07/09/23 0757 07/10/23 0447 07/11/23 0426  WBC 3.9* 5.3 6.3 4.4 4.3  HGB 10.4* 10.9* 12.0 10.7* 11.0*  HCT 30.4* 31.5* 36.0 33.4* 33.4*  MCV 89.9 89.0 92.8 94.1 92.3  PLT 64* 68* 68* 56* 105*   Basic Metabolic Panel: Recent Labs  Lab 07/06/23 0449 07/07/23 0512 07/09/23 0757 07/10/23 0447 07/11/23 0426  NA 137 133* 141 142 136  K 3.5 2.9* 3.7 4.0 3.1*  CL 108 102 113* 116* 111  CO2 20* 23 20* 20* 19*  GLUCOSE 101* 129* 119* 109* 115*  BUN 12 6 5* 7 <5*  CREATININE 0.72 0.53 0.32* 0.52 0.45  CALCIUM 9.0 9.5 9.9 9.6 9.2  MG 1.8 1.9  --   --  1.7  PHOS 2.8  --   --   --   --    GFR: Estimated Creatinine Clearance: 78.2 mL/min (by C-G formula based on SCr of 0.45 mg/dL). Liver Function Tests: Recent Labs  Lab 07/06/23 0449 07/07/23 0512 07/09/23 0757 07/10/23 0447 07/11/23 0426  AST 64* 47* 42* 34 22  ALT 52* 44 40 32 27  ALKPHOS 72 75 67 65 63  BILITOT 1.3* 1.3* 1.2 0.9 0.9  PROT 6.6 7.1 7.1 6.8 6.7  ALBUMIN 3.6 3.9 3.7 3.4* 3.2*     Recent Results (from the past 240 hour(s))  MRSA Next Gen by PCR, Nasal     Status: None   Collection Time: 07/05/23 10:10 PM   Specimen: Nasal Mucosa; Nasal Swab  Result Value Ref Range Status   MRSA by PCR Next Gen NOT DETECTED NOT DETECTED Final    Comment: (NOTE) The GeneXpert MRSA Assay (FDA approved for NASAL specimens only), is one component of a comprehensive MRSA colonization surveillance program. It is not intended to diagnose MRSA infection nor to guide or monitor treatment for MRSA infections. Test performance is not FDA approved in patients less than 73 years old. Performed at [facility], [address], [place], [place] [address]   Culture, blood (Routine X 2) w Reflex to ID Panel     Status: None   Collection Time: 07/05/23 11:45 PM   Specimen: BLOOD LEFT ARM  Result Value Ref Range Status   Specimen Description BLOOD LEFT ARM  Final   Special Requests   Final    BOTTLES DRAWN AEROBIC AND ANAEROBIC Blood Culture adequate volume   Culture   Final    NO GROWTH 5 DAYS Performed  at [facility], [address], [place], [place] [address]    Report Status 07/11/2023 FINAL  Final  Culture, blood (Routine X 2) w Reflex to ID Panel     Status: None   Collection Time: 07/05/23 11:48 PM   Specimen: BLOOD LEFT HAND  Result Value Ref Range Status   Specimen Description BLOOD LEFT HAND  Final   Special Requests   Final    BOTTLES DRAWN AEROBIC AND ANAEROBIC Blood Culture adequate volume   Culture   Final    NO GROWTH 5 DAYS Performed at [facility], [address] [place], [place] [address]    Report Status 07/11/2023 FINAL  Final  Culture, blood (Routine X 2) w Reflex to ID Panel     Status: None (Preliminary result)   Collection Time: 07/09/23  9:09 AM   Specimen: BLOOD  Result Value Ref Range Status   Specimen Description BLOOD LEFT ASSIST CONTROL  Final   Special Requests   Final    BOTTLES DRAWN AEROBIC AND ANAEROBIC Blood Culture adequate volume   Culture   Final    NO GROWTH 2 DAYS Performed at [facility], [address] [place], [place] [address]    Report Status PENDING  Incomplete  Culture, blood (Routine X 2) w Reflex to ID Panel     Status: None (Preliminary result)   Collection Time: 07/09/23  9:09 AM   Specimen: BLOOD  Result Value Ref Range Status   Specimen Description BLOOD LEFT HAND  Final   Special Requests   Final    BOTTLES DRAWN AEROBIC AND ANAEROBIC Blood Culture adequate volume   Culture   Final    NO GROWTH 2 DAYS Performed at [facility], [address], [place], [place] [address]    Report Status PENDING  Incomplete      Scheduled Meds:   Chlorhexidine Gluconate Cloth  6 each Topical Daily   diltiazem  120 mg Oral Daily   folic acid  1 mg Oral Daily   multivitamin with minerals  1 tablet Oral Daily   nicotine  14 mg Transdermal Daily   polyethylene glycol  17 g Oral Daily   senna-docusate  1 tablet Oral BID   thiamine  100 mg Oral Daily   Or   thiamine  100 mg Intravenous Daily     LOS: 6 days    Kendell Bane, MD [facility] Greater than 55 minutes of critical time was spent evaluating, reviewing labs, medications, drawn plan of care.  If 7PM-7AM, please contact night-coverage [email or web address]

## 2023-07-12 ENCOUNTER — Inpatient Hospital Stay (HOSPITAL_COMMUNITY): Payer: Medicaid Other

## 2023-07-12 DIAGNOSIS — R112 Nausea with vomiting, unspecified: Secondary | ICD-10-CM | POA: Diagnosis not present

## 2023-07-12 DIAGNOSIS — R7989 Other specified abnormal findings of blood chemistry: Secondary | ICD-10-CM

## 2023-07-12 DIAGNOSIS — F10931 Alcohol use, unspecified with withdrawal delirium: Secondary | ICD-10-CM | POA: Diagnosis not present

## 2023-07-12 DIAGNOSIS — K56609 Unspecified intestinal obstruction, unspecified as to partial versus complete obstruction: Secondary | ICD-10-CM

## 2023-07-12 LAB — CBC
HCT: 39 % (ref 36.0–46.0)
Hemoglobin: 12.7 g/dL (ref 12.0–15.0)
MCH: 30.2 pg (ref 26.0–34.0)
MCHC: 32.6 g/dL (ref 30.0–36.0)
MCV: 92.9 fL (ref 80.0–100.0)
Platelets: 112 10*3/uL — ABNORMAL LOW (ref 150–400)
RBC: 4.2 MIL/uL (ref 3.87–5.11)
RDW: 14.4 % (ref 11.5–15.5)
WBC: 2.2 10*3/uL — ABNORMAL LOW (ref 4.0–10.5)
nRBC: 0 % (ref 0.0–0.2)

## 2023-07-12 LAB — BASIC METABOLIC PANEL
Anion gap: 7 (ref 5–15)
BUN: 6 mg/dL (ref 6–20)
CO2: 19 mmol/L — ABNORMAL LOW (ref 22–32)
Calcium: 8.9 mg/dL (ref 8.9–10.3)
Chloride: 107 mmol/L (ref 98–111)
Creatinine, Ser: 0.51 mg/dL (ref 0.44–1.00)
GFR, Estimated: >60 mL/min (ref >60)
Glucose, Bld: 152 mg/dL — ABNORMAL HIGH (ref 70–99)
Potassium: 3.1 mmol/L — ABNORMAL LOW (ref 3.5–5.1)
Sodium: 133 mmol/L — ABNORMAL LOW (ref 135–145)

## 2023-07-12 LAB — LACTIC ACID, PLASMA: Lactic Acid, Venous: 1.6 mmol/L (ref 0.5–1.9)

## 2023-07-12 LAB — LIPASE, BLOOD: Lipase: 41 U/L (ref 11–51)

## 2023-07-12 LAB — CULTURE, BLOOD (ROUTINE X 2)

## 2023-07-12 MED ORDER — POTASSIUM CHLORIDE CRYS ER 20 MEQ PO TBCR: 40.0000 meq | EXTENDED_RELEASE_TABLET | Freq: Once | ORAL | Status: DC

## 2023-07-12 MED ORDER — POTASSIUM CHLORIDE 2 MEQ/ML IV SOLN
INTRAVENOUS | Status: DC
  Filled 2023-07-12 (×2): qty 1000

## 2023-07-12 MED ORDER — LORAZEPAM 1 MG PO TABS: 1.0000 mg | ORAL_TABLET | ORAL | Status: DC | PRN

## 2023-07-12 MED ORDER — THIAMINE HCL 100 MG/ML IJ SOLN
500.0000 mg | Freq: Three times a day (TID) | INTRAVENOUS | Status: AC
  Administered 2023-07-12 – 2023-07-13 (×5): 500 mg via INTRAVENOUS
  Filled 2023-07-12 (×6): qty 5

## 2023-07-12 MED ORDER — POTASSIUM CHLORIDE 10 MEQ/100ML IV SOLN
10.0000 meq | INTRAVENOUS | Status: AC
  Administered 2023-07-12 (×2): 10 meq via INTRAVENOUS
  Filled 2023-07-12 (×2): qty 100

## 2023-07-12 MED ORDER — LORAZEPAM 2 MG/ML IJ SOLN: 1.0000 mg | INTRAMUSCULAR | Status: DC | PRN

## 2023-07-12 MED ORDER — LORAZEPAM 2 MG/ML IJ SOLN
1.0000 mg | INTRAMUSCULAR | Status: DC | PRN
  Administered 2023-07-12 – 2023-07-15 (×11): 2 mg via INTRAVENOUS
  Filled 2023-07-12 (×11): qty 1

## 2023-07-12 MED ORDER — THIAMINE HCL 100 MG/ML IJ SOLN: 100.0000 mg | Freq: Every day | INTRAMUSCULAR | Status: DC

## 2023-07-12 MED ORDER — METOPROLOL TARTRATE 5 MG/5ML IV SOLN
5.0000 mg | Freq: Three times a day (TID) | INTRAVENOUS | Status: DC
  Administered 2023-07-12 – 2023-07-15 (×8): 5 mg via INTRAVENOUS
  Filled 2023-07-12 (×9): qty 5

## 2023-07-12 MED ORDER — THIAMINE MONONITRATE 100 MG PO TABS: 100.0000 mg | ORAL_TABLET | Freq: Every day | ORAL | Status: DC

## 2023-07-12 MED ORDER — LORAZEPAM 2 MG/ML IJ SOLN
1.0000 mg | INTRAMUSCULAR | Status: DC | PRN
  Administered 2023-07-12 (×2): 2 mg via INTRAVENOUS
  Filled 2023-07-12 (×2): qty 1

## 2023-07-12 MED ORDER — DEXMEDETOMIDINE HCL IN NACL 400 MCG/100ML IV SOLN
0.0000 ug/kg/h | INTRAVENOUS | Status: DC
  Administered 2023-07-12: 0.6 ug/kg/h via INTRAVENOUS
  Administered 2023-07-12: 0.4 ug/kg/h via INTRAVENOUS
  Administered 2023-07-13: 0.8 ug/kg/h via INTRAVENOUS
  Administered 2023-07-13 (×2): 1.2 ug/kg/h via INTRAVENOUS
  Administered 2023-07-13: 1 ug/kg/h via INTRAVENOUS
  Administered 2023-07-14 (×2): 1.2 ug/kg/h via INTRAVENOUS
  Administered 2023-07-14: 1 ug/kg/h via INTRAVENOUS
  Administered 2023-07-14: 0.9 ug/kg/h via INTRAVENOUS
  Administered 2023-07-15 (×2): 0.8 ug/kg/h via INTRAVENOUS
  Administered 2023-07-15: 0.9 ug/kg/h via INTRAVENOUS
  Administered 2023-07-15: 0.7 ug/kg/h via INTRAVENOUS
  Administered 2023-07-16: 0.6 ug/kg/h via INTRAVENOUS
  Administered 2023-07-17: 0.7 ug/kg/h via INTRAVENOUS
  Filled 2023-07-12 (×17): qty 100

## 2023-07-12 MED ORDER — KCL IN DEXTROSE-NACL 20-5-0.9 MEQ/L-%-% IV SOLN: INTRAVENOUS | Status: AC

## 2023-07-12 MED ORDER — DEXMEDETOMIDINE HCL IN NACL 400 MCG/100ML IV SOLN: 0.0000 ug/kg/h | INTRAVENOUS | Status: DC

## 2023-07-12 NOTE — Progress Notes (Signed)
PROGRESS NOTE    ABIGAELLE [name]  UYQ:[id number] DOB: 20-Jun-1971 DOA: 07/05/2023 PCP: Rica Records, FNP   Brief Narrative:    Heidi [name] is a 52 y.o. female with medical history significant of hypertension, alcohol abuse, tobacco use disorder who presents to the emergency department from home via EMS due to complaints of generalized bodyaches, nausea, vomiting which she thinks was related to alcohol withdrawal.  She continues to have signs of alcohol withdrawal and tremors this morning.  She remains on CIWA protocol and requires continuous monitoring in stepdown unit.   Subjective:  The patient was seen and examined this morning, sleepy but arousable.  Per nursing staff at the bedside had a projectile vomiting x 1 this morning nonbloody Abdomen is distended, had a bowel movement yesterday x 1 No reported of gas-she denies of having any abdominal pain  Assessment & Plan:   Principal Problem:   Alcohol withdrawal (HCC) Active Problems:   Nausea & vomiting   Essential hypertension   Transaminitis   Tobacco use disorder   Hypokalemia   Thrombocytopenia (HCC)   Hypothermia  Assessment and Plan:   Alcohol withdrawal -Improving but delayed improvement in symptoms, delirious with some hallucination  -Continue on CIWA protocol Ativan -Discontinue IV Precedex -Which has been started on 07/08/2023 -Librium discontinued   POA: Alcohol level was < 10 -Continuing thiamine and multivitamin Continue fall precaution    Abdominal distention-ileus  Associated with nausea vomiting x 1 -WilContinue with IV fluid hydration with supplement Kcl - keep the patient n.p.o. -If continued nausea vomiting will consider NG tube placement -KUB reviewed consistent with ileus versus SBO Obtaining CT of abdomen -As needed antiemetic   Transaminitis    Latest Ref Rng & Units 07/11/2023    4:26 AM 07/10/2023    4:47 AM 07/09/2023    7:57 AM  Hepatic Function  Total Protein 6.5  - 8.1 g/dL 6.7  6.8  7.1   Albumin 3.5 - 5.0 g/dL 3.2  3.4  3.7   AST 15 - 41 U/L 22  34  42   ALT 0 - 44 U/L 27  32  40   Alk Phosphatase 38 - 126 U/L 63  65  67   Total Bilirubin 0.3 - 1.2 mg/dL 0.9  0.9  1.2      --Improving Possibly related to patient's history of alcohol use disorder Continue to monitor liver enzymes Plan to obtain right upper quadrant ultrasound to evaluate for liver cirrhosis   Nausea and vomiting Worsening with abdominal distention positive ileus versus SBO -KUB reviewed, obtain a CT of abdomen -Continue as needed Zofran, Phenergan if worsen will add Reglan   Persistent hypokalemia  -Repleting with IV fluid    Thrombocytopenia Likely related to alcohol use -Platelets 68, 56... 105  Change heparin to SCDs Continue to monitor CBC   Hypothermia -1 episode during this admission WBC: 6.3, LA 0.8, Procal: 0.94, CXR - WNL  No Abx for now  Will  Check UA -normal limits    Essential hypertension - Hypotensive  Holding  irbesartan and Cardizem -Blood pressure has stabilized   Tobacco use disorder Continue nicotine patch Patient will be counseled on tobacco use disorder cessation when more stable    DVT prophylaxis: SCDs Code Status: Full Family Communication: None at bedside Disposition Plan:  Status is: Inpatient Remains inpatient appropriate because: Need for close monitoring.   Consultants:  None  Procedures:  None  Antimicrobials:  None    Objective:  Vitals:   07/12/23 0600 07/12/23 0700 07/12/23 0730 07/12/23 0800  BP: (!) 142/78 (!) 142/90  132/83  Pulse: (!) 125 (!) 130  (!) 130  Resp: (!) 32 (!) 27  (!) 30  Temp:   (!) 97.5 F (36.4 C)   TempSrc:   Oral   SpO2: 99% 98%  97%  Weight:      Height:        Intake/Output Summary (Last 24 hours) at 07/12/2023 0918 Last data filed at 07/12/2023 0736 Gross per 24 hour  Intake 1984.32 ml  Output 640 ml  Net 1344.32 ml    Filed Weights   07/10/23 0502 07/11/23 0441  07/12/23 0422  Weight: 66.6 kg 67 kg 67.6 kg        General:  Somnolent, arousable-hallucinating  HEENT:  Normocephalic, PERRL, otherwise with in Normal limits   Neuro:  CNII-XII intact. , normal motor and sensation, reflexes intact   Lungs:   Clear to auscultation BL, Respirations unlabored,  No wheezes / crackles  Cardio:    S1/S2, RRR, No murmure, No Rubs or Gallops   Abdomen:  Soft, distended, hypoactive bowel sounds  Muscular  skeletal:  Limited exam -global generalized weaknesses - in bed, able to move all 4 extremities,   2+ pulses,  symmetric, No pitting edema  Skin:  Dry, warm to touch, negative for any Rashes,  Wounds: Please see nursing documentation           Data Reviewed: I have personally reviewed following labs and imaging studies  CBC: Recent Labs  Lab 07/07/23 0512 07/09/23 0757 07/10/23 0447 07/11/23 0426 07/12/23 0327  WBC 5.3 6.3 4.4 4.3 2.2*  HGB 10.9* 12.0 10.7* 11.0* 12.7  HCT 31.5* 36.0 33.4* 33.4* 39.0  MCV 89.0 92.8 94.1 92.3 92.9  PLT 68* 68* 56* 105* 112*   Basic Metabolic Panel: Recent Labs  Lab 07/06/23 0449 07/07/23 0512 07/09/23 0757 07/10/23 0447 07/11/23 0426 07/12/23 0327  NA 137 133* 141 142 136 133*  K 3.5 2.9* 3.7 4.0 3.1* 3.1*  CL 108 102 113* 116* 111 107  CO2 20* 23 20* 20* 19* 19*  GLUCOSE 101* 129* 119* 109* 115* 152*  BUN 12 6 5* 7 <5* 6  CREATININE 0.72 0.53 0.32* 0.52 0.45 0.51  CALCIUM 9.0 9.5 9.9 9.6 9.2 8.9  MG 1.8 1.9  --   --  1.7  --   PHOS 2.8  --   --   --   --   --    GFR: Estimated Creatinine Clearance: 78.6 mL/min (by C-G formula based on SCr of 0.51 mg/dL). Liver Function Tests: Recent Labs  Lab 07/06/23 0449 07/07/23 0512 07/09/23 0757 07/10/23 0447 07/11/23 0426  AST 64* 47* 42* 34 22  ALT 52* 44 40 32 27  ALKPHOS 72 75 67 65 63  BILITOT 1.3* 1.3* 1.2 0.9 0.9  PROT 6.6 7.1 7.1 6.8 6.7  ALBUMIN 3.6 3.9 3.7 3.4* 3.2*     Recent Results (from the past 240 hour(s))  MRSA Next  Gen by PCR, Nasal     Status: None   Collection Time: 07/05/23 10:10 PM   Specimen: Nasal Mucosa; Nasal Swab  Result Value Ref Range Status   MRSA by PCR Next Gen NOT DETECTED NOT DETECTED Final    Comment: (NOTE) The GeneXpert MRSA Assay (FDA approved for NASAL specimens only), is one component of a comprehensive MRSA colonization surveillance program. It is not intended to diagnose MRSA infection nor to  guide or monitor treatment for MRSA infections. Test performance is not FDA approved in patients less than 73 years old. Performed at [facility], [address], [place], [place] [address]   Culture, blood (Routine X 2) w Reflex to ID Panel     Status: None   Collection Time: 07/05/23 11:45 PM   Specimen: BLOOD LEFT ARM  Result Value Ref Range Status   Specimen Description BLOOD LEFT ARM  Final   Special Requests   Final    BOTTLES DRAWN AEROBIC AND ANAEROBIC Blood Culture adequate volume   Culture   Final    NO GROWTH 5 DAYS Performed at [facility], [address] [place], [place] [address]    Report Status 07/11/2023 FINAL  Final  Culture, blood (Routine X 2) w Reflex to ID Panel     Status: None   Collection Time: 07/05/23 11:48 PM   Specimen: BLOOD LEFT HAND  Result Value Ref Range Status   Specimen Description BLOOD LEFT HAND  Final   Special Requests   Final    BOTTLES DRAWN AEROBIC AND ANAEROBIC Blood Culture adequate volume   Culture   Final    NO GROWTH 5 DAYS Performed at [facility], [address], [place], [place] [address]    Report Status 07/11/2023 FINAL  Final  Culture, blood (Routine X 2) w Reflex to ID Panel     Status: None (Preliminary result)   Collection Time: 07/09/23  9:09 AM   Specimen: BLOOD  Result Value Ref Range Status   Specimen Description BLOOD LEFT ASSIST CONTROL  Final   Special Requests   Final    BOTTLES DRAWN AEROBIC AND ANAEROBIC Blood Culture adequate volume   Culture   Final    NO GROWTH 3 DAYS Performed at [facility], [address], [place], [place] [address]    Report Status PENDING  Incomplete  Culture, blood (Routine X 2) w Reflex to ID Panel     Status: None (Preliminary result)   Collection Time: 07/09/23  9:09 AM   Specimen: BLOOD  Result Value Ref Range Status   Specimen Description BLOOD LEFT HAND  Final   Special Requests   Final    BOTTLES DRAWN AEROBIC AND ANAEROBIC Blood Culture adequate volume   Culture   Final    NO GROWTH 3 DAYS Performed at [facility], [address] [place], [place] [address]    Report Status PENDING  Incomplete      Scheduled Meds:  Chlorhexidine Gluconate Cloth  6 each Topical Daily   diltiazem  120 mg Oral Daily   folic acid  1 mg Oral Daily   multivitamin with minerals  1 tablet Oral Daily   nicotine  14 mg Transdermal Daily   polyethylene glycol  17 g Oral Daily   senna-docusate  1 tablet Oral BID   [START ON 07/14/2023] thiamine  100 mg Oral Daily   Or   [START ON 07/14/2023] thiamine  100 mg Intravenous Daily     LOS: 7 days    Kendell Bane, MD [facility] Greater than 55 minutes of critical time was spent evaluating, reviewing labs, medications, drawn plan of care.  If 7PM-7AM, please contact night-coverage [email or web address]

## 2023-07-12 NOTE — Plan of Care (Signed)
Patients abdomen was very distended today.  NG was placed with 500 out.  Plan to do colonoscopy tomorrow.  Vitals have remained stable.

## 2023-07-12 NOTE — Consult Note (Addendum)
@LOGO @   Referring Provider: [facility]  Primary Care Physician:  Rica Records, FNP Primary Gastroenterologist:  Dr. Marletta Lor (previously unassigned)  Date of Admission: 07/05/23 Date of Consultation: 07/12/23  Reason for Consultation:  N/V.   HPI:  Heidi [name] is a 52 y.o. year old female with history of HTN, alcohol abuse, who presented to the emergency room on 7/15 via EMS due to generalized bodyaches, nausea, vomiting which she thought was secondary to alcohol withdrawal.   In the ER, she was found to have a temperature of 100.2, mild hypokalemia, elevated LFTs and mildly elevated bilirubin.  Urinalysis unimpressive.  Blood cultures were drawn and ultimately negative.  RUQ ultrasound showed hepatic steatosis.    She was initiated on CIWA protocol for alcohol withdrawal, Zofran as needed for nausea/vomiting.  She then developed delirium tremens and started on IV Precedex on 7/18, discontinued today.   LFTs normalized  Hypothermia on 7/19 with rectal temp of 89.7.  Blood cultures were redrawn on 7/19 with no growth so far. Chest x-ray was negative.  She continued with intermittent nausea/vomiting/projectile vomiting this morning.  Abdominal x-ray revealed significantly dilated bowel loops which could represent obstruction or ileus.  CT A/P without contrast was ordered.  This has been completed, but the final read not available.  Consult:  Difficult historian. Confused and tired at this time though she just received a dose of Ativan.  Able to tell me she is at the hospital, but unable to tell me name, etc. Speech is garbled. She does report lower abdominal pain and nausea/vomiting. States she had a BM. Denies any chronic GI problems prior to admission. No prior abdominal surgeries.   Spoke with patient's nurse.  States last night, patient developed 2 episodes of dark emesis and noted that her abdomen was distended and taut.  States she has been constipated and had been  receiving quite a bit of MiraLAX and senna and did have 2 large bowel movements last night.  This morning, she had another episode of projectile emesis but also had another bowel movement as well.  Currently has Flexi-Seal in place due to large, loose bowel movements.No hematemesis, brbpr, or melena.   Patient denies prior colonoscopy.   Past Medical History:  Diagnosis Date   Alcohol withdrawal (HCC)    Cervical radiculopathy    Malignant hyperthermia     History reviewed. No pertinent surgical history.  Prior to Admission medications   Medication Sig Start Date End Date Taking? Authorizing Provider  cyclobenzaprine (FLEXERIL) 5 MG tablet TAKE 1 TABLET BY MOUTH (3) TIMES A DAY AS NEEDED FOR MUSCLE SPASMS. Patient taking differently: Take 5 mg by mouth 3 (three) times daily as needed for muscle spasms. 06/27/23  Yes Del Newman Nip, Tenna Child, FNP  naproxen (NAPROSYN) 500 MG tablet Take 1 tablet (500 mg total) by mouth 2 (two) times daily with a meal. 06/04/23  Yes Vickki Hearing, MD  olmesartan (BENICAR) 20 MG tablet Take 0.5 tablets (10 mg total) by mouth daily. 05/27/23  Yes Del Newman Nip, Tenna Child, FNP  ondansetron (ZOFRAN) 4 MG tablet TAKE 1 TABLET BY MOUTH EVERY 6 HOURS 06/27/23  Yes Del Newman Nip, [place], FNP  chlordiazePOXIDE (LIBRIUM) 5 MG capsule Take 2 tablet by mouth 3 times a day x 2 days; then 1 tablet by mouth 3 times a day x 2 days; then 1 tablet by mouth twice a day x 2 days; then 1 tablet by mouth daily x 3 days and stop  Librium. Patient not taking: Reported on 07/10/2023 04/15/23   Vassie Loll, MD  diltiazem (CARDIZEM CD) 120 MG 24 hr capsule Take 1 capsule (120 mg total) by mouth daily. Patient not taking: Reported on 07/10/2023 04/16/23   Vassie Loll, MD  naltrexone (DEPADE) 50 MG tablet Take 1 tablet (50 mg total) by mouth daily. Patient not taking: Reported on 07/10/2023 05/18/23   Del Nigel Berthold, FNP  pantoprazole (PROTONIX) 40 MG tablet Take 1 tablet (40  mg total) by mouth daily. Patient not taking: Reported on 06/04/2023 04/15/23 04/14/24  Vassie Loll, MD  QUEtiapine (SEROQUEL) 25 MG tablet Take 1 tablet (25 mg total) by mouth at bedtime. Patient not taking: Reported on 07/10/2023 04/15/23   Vassie Loll, MD    Current Facility-Administered Medications  Medication Dose Route Frequency Provider Last Rate Last Admin   acetaminophen (TYLENOL) tablet 650 mg  650 mg Oral Q6H PRN Adefeso, Oladapo, DO   650 mg at 07/06/23 1101   Or   acetaminophen (TYLENOL) suppository 650 mg  650 mg Rectal Q6H PRN Adefeso, Oladapo, DO       Chlorhexidine Gluconate Cloth 2 % PADS 6 each  6 each Topical Daily Adefeso, Oladapo, DO   6 each at 07/11/23 2245   dextrose 5 % and 0.9 % NaCl with KCl 20 mEq/L infusion   Intravenous Continuous Shahmehdi, Seyed A, MD       diltiazem (CARDIZEM CD) 24 hr capsule 120 mg  120 mg Oral Daily Shahmehdi, Seyed A, MD   120 mg at 07/11/23 0800   folic acid (FOLVITE) tablet 1 mg  1 mg Oral Daily Adefeso, Oladapo, DO   1 mg at 07/11/23 0801   hydrALAZINE (APRESOLINE) injection 10 mg  10 mg Intravenous Q4H PRN Nevin Bloodgood A, MD   10 mg at 07/11/23 2242   LORazepam (ATIVAN) tablet 1-4 mg  1-4 mg Oral Q4H PRN Shahmehdi, Seyed A, MD       Or   LORazepam (ATIVAN) injection 1-2 mg  1-2 mg Intravenous Q2H PRN Kendell Bane, MD       multivitamin with minerals tablet 1 tablet  1 tablet Oral Daily Adefeso, Oladapo, DO   1 tablet at 07/11/23 0800   nicotine (NICODERM CQ - dosed in mg/24 hours) patch 14 mg  14 mg Transdermal Daily Adefeso, Oladapo, DO   14 mg at 07/11/23 0800   ondansetron (ZOFRAN) tablet 4 mg  4 mg Oral Q6H PRN Adefeso, Oladapo, DO       Or   ondansetron (ZOFRAN) injection 4 mg  4 mg Intravenous Q6H PRN Adefeso, Oladapo, DO   4 mg at 07/12/23 4098   oxyCODONE (Oxy IR/ROXICODONE) immediate release tablet 5 mg  5 mg Oral Q6H PRN Sherryll Burger, Pratik D, DO   5 mg at 07/11/23 1218   polyethylene glycol (MIRALAX / GLYCOLAX) packet  17 g  17 g Oral Daily Shahmehdi, Seyed A, MD   17 g at 07/11/23 0800   potassium chloride 10 mEq in 100 mL IVPB  10 mEq Intravenous Q1 Hr x 2 Shahmehdi, Seyed A, MD       senna-docusate (Senokot-S) tablet 1 tablet  1 tablet Oral BID Nevin Bloodgood A, MD   1 tablet at 07/11/23 0801   thiamine (VITAMIN B1) 500 mg in sodium chloride 0.9 % 50 mL IVPB  500 mg Intravenous Q8H Shahmehdi, Seyed A, MD       [START ON 07/14/2023] thiamine (VITAMIN B1) tablet 100 mg  100  mg Oral Daily Kendell Bane, MD       Or   Melene Muller ON 07/14/2023] thiamine (VITAMIN B1) injection 100 mg  100 mg Intravenous Daily Shahmehdi, Seyed A, MD       traMADol (ULTRAM) tablet 50 mg  50 mg Oral Q6H PRN Sherryll Burger, Pratik D, DO   50 mg at 07/09/23 1956    Allergies as of 07/05/2023   (No Known Allergies)    Family History  Problem Relation Age of Onset   Stroke Father     Social History   Socioeconomic History   Marital status: Widowed    Spouse name: Not on file   Number of children: Not on file   Years of education: Not on file   Highest education level: Not on file  Occupational History   Not on file  Tobacco Use   Smoking status: Every Day    Current packs/day: 0.50    Average packs/day: 0.5 packs/day for 20.0 years (10.0 ttl pk-yrs)    Types: Cigarettes   Smokeless tobacco: Never  Vaping Use   Vaping status: Never Used  Substance and Sexual Activity   Alcohol use: Yes    Alcohol/week: 42.0 standard drinks of alcohol    Types: 42 Cans of beer per week    Comment: 6 pack of beer daily, last drank yesterday 04/04/23   Drug use: No   Sexual activity: Not on file  Other Topics Concern   Not on file  Social History Narrative   Not on file   Social Determinants of Health   Financial Resource Strain: Not on file  Food Insecurity: No Food Insecurity (07/05/2023)   Hunger Vital Sign    Worried About Running Out of Food in the Last Year: Never true    Ran Out of Food in the Last Year: Never true   Transportation Needs: No Transportation Needs (07/05/2023)   PRAPARE - Administrator, Civil Service (Medical): No    Lack of Transportation (Non-Medical): No  Physical Activity: Not on file  Stress: Not on file  Social Connections: Not on file  Intimate Partner Violence: Not At Risk (07/05/2023)   Humiliation, Afraid, Rape, and Kick questionnaire    Fear of Current or Ex-Partner: No    Emotionally Abused: No    Physically Abused: No    Sexually Abused: No    Review of Systems: Limited assessment due to altered mental status. See HPI for details.    Physical Exam: Vital signs in last 24 hours: Temp:  [97.5 F (36.4 C)-98.7 F (37.1 C)] 97.5 F (36.4 C) (07/22 0730) Pulse Rate:  [78-143] 130 (07/22 0800) Resp:  [17-33] 30 (07/22 0800) BP: (108-175)/(71-110) 132/83 (07/22 0800) SpO2:  [97 %-100 %] 97 % (07/22 0800) Weight:  [67.6 kg] 67.6 kg (07/22 0422) Last BM Date : 07/11/23 General:   Somnolent, but arouses easily.  Well-developed, well-nourished,  NAD Head:  Normocephalic and atraumatic. Eyes:  Sclera clear, no icterus.   Conjunctiva pink. Ears:  Normal auditory acuity. Lungs:  Clear throughout to auscultation.   No wheezes, crackles, or rhonchi. No acute distress. Heart: Tachycardic.  Regular rhythm.  No murmurs, clicks, rubs,  or gallops. Abdomen:  Distended and taut. Hypoactive bowel sounds.   Generalized TTP.  Rectal:  Deferred Msk:  Symmetrical without gross deformities. Normal posture. Extremities:  Without  edema. SCDs in place.  Neurologic:  Alert. Unable to assess due to altered mental status following recent ativan.  Skin:  Intact without significant lesions or rashes.  Intake/Output from previous day: 07/21 0701 - 07/22 0700 In: 2219.9 [P.O.:360; I.V.:1859.9] Out: 1300 [Urine:1300] Intake/Output this shift: Total I/O In: -  Out: 40 [Stool:40]  Lab Results: Recent Labs    07/10/23 0447 07/11/23 0426 07/12/23 0327  WBC 4.4 4.3 2.2*   HGB 10.7* 11.0* 12.7  HCT 33.4* 33.4* 39.0  PLT 56* 105* 112*   BMET Recent Labs    07/10/23 0447 07/11/23 0426 07/12/23 0327  NA 142 136 133*  K 4.0 3.1* 3.1*  CL 116* 111 107  CO2 20* 19* 19*  GLUCOSE 109* 115* 152*  BUN 7 <5* 6  CREATININE 0.52 0.45 0.51  CALCIUM 9.6 9.2 8.9   LFT Recent Labs    07/10/23 0447 07/11/23 0426  PROT 6.8 6.7  ALBUMIN 3.4* 3.2*  AST 34 22  ALT 32 27  ALKPHOS 65 63  BILITOT 0.9 0.9   Studies/Results: DG Abd 1 View  Result Date: 07/12/2023 CLINICAL DATA:  Abdominal pain EXAM: ABDOMEN - 1 VIEW COMPARISON:  None Available. FINDINGS: Significantly dilated bowel loops. No definite free air on this limited supine radiograph. No abnormal calcifications identified although bowel loops limited assessment. Lower lumbar degenerative change. IMPRESSION: Significantly dilated bowel loops which could represent obstruction or ileus. Consider CT of the abdomen/pelvis for further evaluation. Electronically Signed   By: Feliberto Harts M.D.   On: 07/12/2023 08:31    Impression: 52 year old female with history of HTN, alcohol abuse, who presented to the emergency room 7/15 via EMS due to generalized bodyaches, nausea, vomiting which she thought was secondary to alcohol withdrawal.  She is found to have temperature of 100.2, hypokalemia, mildly elevated LFTs and bilirubin.  RUQ ultrasound with hepatic steatosis.  She was admitted on CIWA protocol.  Ultimately developed severe alcohol withdrawal, DTs, and started on Precedex on 7/18 which was discontinued today, continues on Ativan.  LFTs normalized. Nausea has been treated with Zofran as needed, but she developed projectile vomiting last night and was noted to have a distended abdomen.  Abdominal x-ray this morning with significantly dilated bowel loops which could represent obstruction or ileus.  CT A/P has been completed, but final read not available.  GI now consulted for further  evaluation/management.  Abdominal pain/nausea/vomiting:  Likely secondary to bowel obstruction/ileus. Abdomen is distended, taut, and with hypoactive bowel sounds. Interestingly, she was dealing with constipation, but has had 3 large, loose BMs over the last 24 hours per nursing though she has been receiving MiraLAX and Senokot-S. No chronic GI problems and no prior abdominal surgeries. Will need to follow-up on CT results. Overall, suspect ileus in the setting of hospitalization, decreased activity in the setting of alcohol withdrawal requiring sedation. She would likely benefit from NG tube.  Will also need to keep electrolytes corrected. Currently with mild hypokalemia and hyponatremia.   Plan: NPO Follow-up on CT scan.  Likely needs NG tube.  Needs correction of hyponatremia and hypokalemia. Management per hospitalist.    LOS: 7 days    07/12/2023, 9:21 AM   Ermalinda Memos, PA-C [facility]

## 2023-07-13 ENCOUNTER — Inpatient Hospital Stay (HOSPITAL_COMMUNITY): Payer: Medicaid Other

## 2023-07-13 ENCOUNTER — Encounter (HOSPITAL_COMMUNITY): Payer: Self-pay | Admitting: Internal Medicine

## 2023-07-13 ENCOUNTER — Inpatient Hospital Stay (HOSPITAL_COMMUNITY): Payer: Medicaid Other | Admitting: Anesthesiology

## 2023-07-13 ENCOUNTER — Encounter (HOSPITAL_COMMUNITY)
Admission: EM | Disposition: A | Discharge status: Home / Self Care | Payer: Self-pay | Source: Home / Self Care | Attending: Family Medicine

## 2023-07-13 DIAGNOSIS — K567 Ileus, unspecified: Secondary | ICD-10-CM

## 2023-07-13 DIAGNOSIS — F10931 Alcohol use, unspecified with withdrawal delirium: Secondary | ICD-10-CM | POA: Diagnosis not present

## 2023-07-13 HISTORY — PX: FLEXIBLE SIGMOIDOSCOPY: SHX5431

## 2023-07-13 LAB — BASIC METABOLIC PANEL
Anion gap: 10 (ref 5–15)
BUN: 9 mg/dL (ref 6–20)
CO2: 21 mmol/L — ABNORMAL LOW (ref 22–32)
Calcium: 9.7 mg/dL (ref 8.9–10.3)
Chloride: 107 mmol/L (ref 98–111)
Creatinine, Ser: 0.59 mg/dL (ref 0.44–1.00)
GFR, Estimated: >60 mL/min (ref >60)
Glucose, Bld: 127 mg/dL — ABNORMAL HIGH (ref 70–99)
Potassium: 3.4 mmol/L — ABNORMAL LOW (ref 3.5–5.1)
Sodium: 138 mmol/L (ref 135–145)

## 2023-07-13 LAB — CBC
HCT: 37.9 % (ref 36.0–46.0)
Hemoglobin: 12.4 g/dL (ref 12.0–15.0)
MCH: 30.2 pg (ref 26.0–34.0)
MCHC: 32.7 g/dL (ref 30.0–36.0)
MCV: 92.4 fL (ref 80.0–100.0)
Platelets: 158 10*3/uL (ref 150–400)
RBC: 4.1 MIL/uL (ref 3.87–5.11)
RDW: 14.5 % (ref 11.5–15.5)
WBC: 7 10*3/uL (ref 4.0–10.5)
nRBC: 0 % (ref 0.0–0.2)

## 2023-07-13 LAB — CULTURE, BLOOD (ROUTINE X 2): Special Requests: ADEQUATE

## 2023-07-13 LAB — MAGNESIUM: Magnesium: 1.9 mg/dL (ref 1.7–2.4)

## 2023-07-13 LAB — LACTIC ACID, PLASMA: Lactic Acid, Venous: 1.6 mmol/L (ref 0.5–1.9)

## 2023-07-13 SURGERY — SIGMOIDOSCOPY, FLEXIBLE
Anesthesia: General

## 2023-07-13 MED ORDER — MAGNESIUM SULFATE 2 GM/50ML IV SOLN
2.0000 g | Freq: Once | INTRAVENOUS | Status: AC
  Administered 2023-07-13: 2 g via INTRAVENOUS
  Filled 2023-07-13: qty 50

## 2023-07-13 MED ORDER — PROPOFOL 10 MG/ML IV BOLUS
INTRAVENOUS | Status: DC | PRN
  Administered 2023-07-13: 100 mg via INTRAVENOUS

## 2023-07-13 MED ORDER — SODIUM CHLORIDE 0.9 % IV SOLN: INTRAVENOUS | Status: DC

## 2023-07-13 MED ORDER — LACTATED RINGERS IV SOLN: INTRAVENOUS | Status: DC

## 2023-07-13 NOTE — Consult Note (Signed)
NAME:  Heidi [name], MRN:  [id number], DOB:  Apr 30, 1971, LOS: 8 ADMISSION DATE:  07/05/2023, CONSULTATION DATE:  07/13/23 REFERRING MD:  TRH, CHIEF COMPLAINT:  Encephalopathy   History of Present Illness:  52 year old woman with reported history of alcohol abuse presented to the ED via EMS with bodyaches nausea vomiting felt related to alcohol withdrawal in the setting of significant decrease in alcohol consumption that day.  Started on CIWA protocol and admitted.  Had worsening agitation escalated to the ICU 7/17.  Started on Precedex drip.  Seems like this helped initially.  Unfortunate developed worsening nausea vomiting abdominal distention.  Precedex was discontinued 7/22.  It appears to be started later in the day, few hours later with worsening agitation.  Escalating doses over time some worsening agitation overnight in the morning.  Back to maximum dose afternoon 7/23.  CT abdomen pelvis 7/22 showed concern for ileus.  Worsening abdominal exam and abdominal film 7/23.  GI plan for EGD this afternoon for further evaluation.  Pertinent  Medical History    Significant Hospital Events: Including procedures, antibiotic start and stop dates in addition to other pertinent events   7/15 admitted to the hospital with concern for alcohol withdrawal 7/17 escalated care to ICU and Precedex started for worsening agitation 7/23 PCCM consult for agitation and encephalopathy, plan EGD with worsening ileus  Interim History / Subjective:    Objective   Blood pressure 108/74, pulse 86, temperature 98.6 F (37 C), temperature source Axillary, resp. rate (!) 24, height 5\' 3"  (1.6 m), weight 67.9 kg, SpO2 95%.        Intake/Output Summary (Last 24 hours) at 07/13/2023 1516 Last data filed at 07/13/2023 1513 Gross per 24 hour  Intake 2617.76 ml  Output 2250 ml  Net 367.76 ml   Filed Weights   07/11/23 0441 07/12/23 0422 07/13/23 0500  Weight: 67 kg 67.6 kg 67.9 kg    Examination: General:  Lying in hospital bed, no acute distress HENT: Atraumatic, normocephalic Lungs: Respirations even unlabored, on supplemental oxygen Cardiovascular: Sinus rhythm on the monitor, no significant edema noted via camera Abdomen: NG in place, mild distention evident on camera Neuro: Arouses with physical stimulation, poor arousable to voice, does not follow commands   Resolved Hospital Problem list     Assessment & Plan:  Encephalopathy: Present now for some days.  Admitted for about 8 days.  Initial concern for alcohol withdrawal.  However now 1 week plus out this seems less likely.  Reported bouts of agitation with decrease in Precedex.  Precedex target 7/17.  High suspicion for element of withdrawal from Precedex.  Also possible underlying behavioral psychiatric disease, delirium as well. -- Requiring frequent Ativan dosing 06/2021, only 1 dose 9:40 AM 7/23, continue CIWA protocol -- Continue Precedex for now with upcoming EGD, would favor weaning and if agitated will consider slow weaning versus addition of clonidine for , Precedex withdrawal -- On Precedex she is hard to arouse, hesitant to add additional medications at this time  Alcohol withdrawal: Likely out of the window now 8 days admitted.  Is quite possible just a bit longer and starting to turn the corner given decrease in Ativan use throughout the day 7/23. -- Continue CIWA as above  Best Practice (right click and "Reselect all SmartList Selections" daily)   Per primary  Labs   CBC: Recent Labs  Lab 07/09/23 0757 07/10/23 0447 07/11/23 0426 07/12/23 0327 07/13/23 0532  WBC 6.3 4.4 4.3 2.2* 7.0  HGB 12.0  10.7* 11.0* 12.7 12.4  HCT 36.0 33.4* 33.4* 39.0 37.9  MCV 92.8 94.1 92.3 92.9 92.4  PLT 68* 56* 105* 112* 158    Basic Metabolic Panel: Recent Labs  Lab 07/07/23 0512 07/09/23 0757 07/10/23 0447 07/11/23 0426 07/12/23 0327 07/13/23 0532  NA 133* 141 142 136 133* 138  K 2.9* 3.7 4.0 3.1* 3.1* 3.4*  CL 102 113*  116* 111 107 107  CO2 23 20* 20* 19* 19* 21*  GLUCOSE 129* 119* 109* 115* 152* 127*  BUN 6 5* 7 <5* 6 9  CREATININE 0.53 0.32* 0.52 0.45 0.51 0.59  CALCIUM 9.5 9.9 9.6 9.2 8.9 9.7  MG 1.9  --   --  1.7  --  1.9   GFR: Estimated Creatinine Clearance: 78.7 mL/min (by C-G formula based on SCr of 0.59 mg/dL). Recent Labs  Lab 07/09/23 0909 07/10/23 0447 07/11/23 0426 07/12/23 0327 07/12/23 1610 07/13/23 0532 07/13/23 1302  PROCALCITON 0.94  --   --   --   --   --   --   WBC  --  4.4 4.3 2.2*  --  7.0  --   LATICACIDVEN 0.8  --   --   --  1.6  --  1.6    Liver Function Tests: Recent Labs  Lab 07/07/23 0512 07/09/23 0757 07/10/23 0447 07/11/23 0426  AST 47* 42* 34 22  ALT 44 40 32 27  ALKPHOS 75 67 65 63  BILITOT 1.3* 1.2 0.9 0.9  PROT 7.1 7.1 6.8 6.7  ALBUMIN 3.9 3.7 3.4* 3.2*   Recent Labs  Lab 07/12/23 0327  LIPASE 41   No results for input(s): "AMMONIA" in the last 168 hours.  ABG No results found for: "PHART", "PCO2ART", "PO2ART", "HCO3", "TCO2", "ACIDBASEDEF", "O2SAT"   Coagulation Profile: No results for input(s): "INR", "PROTIME" in the last 168 hours.  Cardiac Enzymes: No results for input(s): "CKTOTAL", "CKMB", "CKMBINDEX", "TROPONINI" in the last 168 hours.  HbA1C: No results found for: "HGBA1C"  CBG: Recent Labs  Lab 07/08/23 2114 07/10/23 2116 07/11/23 0513  GLUCAP 170* 92 126*    Review of Systems:   Unable to obtain due to encephalopathy  Past Medical History:  She,  has a past medical history of Alcohol withdrawal (HCC), Cervical radiculopathy, and Malignant hyperthermia.   Surgical History:  History reviewed. No pertinent surgical history.   Social History:   reports that she has been smoking cigarettes. She has a 10 pack-year smoking history. She has never used smokeless tobacco. She reports current alcohol use of about 42.0 standard drinks of alcohol per week. She reports that she does not use drugs.   Family History:  Her  family history includes Stroke in her father.   Allergies No Known Allergies   Home Medications  Prior to Admission medications   Medication Sig Start Date End Date Taking? Authorizing Provider  cyclobenzaprine (FLEXERIL) 5 MG tablet TAKE 1 TABLET BY MOUTH (3) TIMES A DAY AS NEEDED FOR MUSCLE SPASMS. Patient taking differently: Take 5 mg by mouth 3 (three) times daily as needed for muscle spasms. 06/27/23  Yes Del Newman Nip, Tenna Child, FNP  naproxen (NAPROSYN) 500 MG tablet Take 1 tablet (500 mg total) by mouth 2 (two) times daily with a meal. 06/04/23  Yes Vickki Hearing, MD  olmesartan (BENICAR) 20 MG tablet Take 0.5 tablets (10 mg total) by mouth daily. 05/27/23  Yes Del Newman Nip, Iliana, FNP  ondansetron (ZOFRAN) 4 MG tablet TAKE 1 TABLET BY MOUTH  EVERY 6 HOURS 06/27/23  Yes Del Newman Nip, Huntley, FNP  chlordiazePOXIDE (LIBRIUM) 5 MG capsule Take 2 tablet by mouth 3 times a day x 2 days; then 1 tablet by mouth 3 times a day x 2 days; then 1 tablet by mouth twice a day x 2 days; then 1 tablet by mouth daily x 3 days and stop Librium. Patient not taking: Reported on 07/10/2023 04/15/23   Vassie Loll, MD  diltiazem (CARDIZEM CD) 120 MG 24 hr capsule Take 1 capsule (120 mg total) by mouth daily. Patient not taking: Reported on 07/10/2023 04/16/23   Vassie Loll, MD  naltrexone (DEPADE) 50 MG tablet Take 1 tablet (50 mg total) by mouth daily. Patient not taking: Reported on 07/10/2023 05/18/23   Del Nigel Berthold, FNP  pantoprazole (PROTONIX) 40 MG tablet Take 1 tablet (40 mg total) by mouth daily. Patient not taking: Reported on 06/04/2023 04/15/23 04/14/24  Vassie Loll, MD  QUEtiapine (SEROQUEL) 25 MG tablet Take 1 tablet (25 mg total) by mouth at bedtime. Patient not taking: Reported on 07/10/2023 04/15/23   Vassie Loll, MD     Critical care time:     CRITICAL CARE Performed by: Karren Burly   Total critical care time: 32 minutes  Critical care time was  exclusive of separately billable procedures and treating other patients.  Critical care was necessary to treat or prevent imminent or life-threatening deterioration.  Critical care was time spent personally by me on the following activities: development of treatment plan with patient and/or surrogate as well as nursing, discussions with consultants, evaluation of patient's response to treatment, examination of patient, obtaining history from patient or surrogate, ordering and performing treatments and interventions, ordering and review of laboratory studies, ordering and review of radiographic studies, pulse oximetry and re-evaluation of patient's condition.  Karren Burly, MD See Loretha Stapler

## 2023-07-13 NOTE — Progress Notes (Signed)
Subjective: Alert, but not oriented on Precedex. Mumbling, incoherent speech.   Per nursing. Uneventful night. Had 450 output per NG tube over night. Total of 850 yesterday. 150 ml stool output per flexiseal overnight.    Objective: Vital signs in last 24 hours: Temp:  [98 F (36.7 C)-100.9 F (38.3 C)] 98.6 F (37 C) (07/23 0700) Pulse Rate:  [93-170] 106 (07/23 0800) Resp:  [12-44] 33 (07/23 0700) BP: (108-158)/(48-134) 121/90 (07/23 0800) SpO2:  [92 %-100 %] 92 % (07/23 0800) Weight:  [67.9 kg] 67.9 kg (07/23 0500) Last BM Date : 07/12/23 General: Resting bed, arousable, but incoherent as she is sedated on precedex. NG tube and flexiseal in place.   Head:  Normocephalic and atraumatic. Abdomen:  Bowel sounds are hypoactive. Distended, taut, with generalized tenderness to palpation.  No rebound or guarding. No masses appreciated  Extremities:  Without edema.   Intake/Output from previous day: 07/22 0701 - 07/23 0700 In: 1632.6 [I.V.:1397; IV Piggyback:235.7] Out: 1878 [Urine:900; Emesis/NG output:850; Stool:128] Intake/Output this shift: Total I/O In: -  Out: 150 [Stool:150]  Lab Results: Recent Labs    07/11/23 0426 07/12/23 0327 07/13/23 0532  WBC 4.3 2.2* 7.0  HGB 11.0* 12.7 12.4  HCT 33.4* 39.0 37.9  PLT 105* 112* 158   BMET Recent Labs    07/11/23 0426 07/12/23 0327 07/13/23 0532  NA 136 133* 138  K 3.1* 3.1* 3.4*  CL 111 107 107  CO2 19* 19* 21*  GLUCOSE 115* 152* 127*  BUN <5* 6 9  CREATININE 0.45 0.51 0.59  CALCIUM 9.2 8.9 9.7   LFT Recent Labs    07/11/23 0426  PROT 6.7  ALBUMIN 3.2*  AST 22  ALT 27  ALKPHOS 63  BILITOT 0.9    Studies/Results: DG Abd 1 View  Result Date: 07/12/2023 CLINICAL DATA:  Nasogastric tube placement. EXAM: ABDOMEN - 1 VIEW COMPARISON:  Radiograph and CT earlier today FINDINGS: Tip and side port of the enteric tube below the diaphragm in the stomach. Gaseous bowel distension persists throughout the  abdomen. IMPRESSION: Tip and side port of the enteric tube below the diaphragm in the stomach. Electronically Signed   By: Narda Rutherford M.D.   On: 07/12/2023 17:06   CT ABDOMEN PELVIS WO CONTRAST  Result Date: 07/12/2023 CLINICAL DATA:  Abdominal pain, acute. EXAM: CT ABDOMEN AND PELVIS WITHOUT CONTRAST TECHNIQUE: Multidetector CT imaging of the abdomen and pelvis was performed following the standard protocol without IV contrast. RADIATION DOSE REDUCTION: This exam was performed according to the departmental dose-optimization program which includes automated exposure control, adjustment of the mA and/or kV according to patient size and/or use of iterative reconstruction technique. COMPARISON:  Abdominal radiographs 07/12/2023. Abdominal ultrasound 07/06/2023. FINDINGS: Lower chest: Trace bilateral pleural effusions with associated dependent atelectasis at both lung bases. No consolidation or significant pericardial fluid. There is moderate distal esophageal wall thickening which is nonspecific. Hepatobiliary: No suspicious hepatic findings are identified on noncontrast imaging. In the dome of the left hepatic lobe, there is a 9 mm low-density lesion which is likely a cyst. No evidence of gallstones, gallbladder wall thickening or biliary dilatation. Pancreas: Unremarkable. No pancreatic ductal dilatation or surrounding inflammatory changes. Spleen: Normal in size without focal abnormality. Adrenals/Urinary Tract: Both adrenal glands appear normal. No evidence of urinary tract calculus, suspicious renal lesion or hydronephrosis. Mild nonspecific perinephric soft tissue stranding bilaterally. The bladder appears unremarkable for its degree of distention. Stomach/Bowel: There may be a minimal amount of enteric contrast  dependently in the proximal stomach. The stomach and proximal small bowel are not significantly distended. However, as seen on earlier radiographs, there are multiple dilated and fluid-filled  loops of mid to distal small bowel and colon. The colon is dilated into the mid descending colon. The sigmoid colon and rectum are relatively decompressed, although did no focal transition point or obvious colonic mass is identified on this noncontrast study. There is no bowel wall thickening or pneumatosis. There is some high density material within the lumen of the appendix, and the appendix appears mildly distended, although demonstrates no wall thickening or focal surrounding inflammation. A rectal tube is in place. Vascular/Lymphatic: There are no enlarged abdominal or pelvic lymph nodes. Aortic and branch vessel atherosclerosis without evidence of aneurysm. Vascular patency not addressed without contrast. Reproductive: The uterus and ovaries appear normal. No adnexal mass. Other: Moderate amount of low-density ascites. There is soft tissue stranding throughout the mesenteric fat. Possible peritoneal nodularity in the right mid abdomen measuring up to 2.3 x 1.2 cm on image 44/2. No pneumoperitoneum or abdominal wall hernia identified. Musculoskeletal: No acute or significant osseous findings. Unless specific follow-up recommendations are mentioned in the findings or impression sections, no imaging follow-up of any mentioned incidental findings is recommended. IMPRESSION: 1. Multiple dilated and fluid-filled loops of mid to distal small bowel and colon with relative decompression of the sigmoid colon and rectum. No focal transition point or obvious colonic mass is identified on this study which is limited by the lack of intravenous and enteric contrast. Findings could be secondary to an ileus from underlying peritonitis, although are concerning for possible distal colonic obstruction. Recommend further evaluation with colonoscopy. 2. Moderate amount of low-density ascites with soft tissue stranding throughout the mesenteric fat and possible peritoneal nodularity in the right mid abdomen. No evidence of bowel  perforation. Cannot exclude peritoneal carcinomatosis. 3. Mildly prominent appendix without evidence of acute inflammation. 4. Trace bilateral pleural effusions with associated dependent atelectasis at both lung bases. 5. Distal esophageal wall thickening, nonspecific, but potentially related to esophagitis. 6.  Aortic Atherosclerosis (ICD10-I70.0). Electronically Signed   By: Carey Bullocks M.D.   On: 07/12/2023 13:24   DG Abd 1 View  Result Date: 07/12/2023 CLINICAL DATA:  Abdominal pain EXAM: ABDOMEN - 1 VIEW COMPARISON:  None Available. FINDINGS: Significantly dilated bowel loops. No definite free air on this limited supine radiograph. No abnormal calcifications identified although bowel loops limited assessment. Lower lumbar degenerative change. IMPRESSION: Significantly dilated bowel loops which could represent obstruction or ileus. Consider CT of the abdomen/pelvis for further evaluation. Electronically Signed   By: Feliberto Harts M.D.   On: 07/12/2023 08:31    Assessment: 52 year old female with history of HTN, alcohol abuse, who presented to the emergency room 7/15 via EMS due to generalized bodyaches, nausea, vomiting which she thought was secondary to alcohol withdrawal.  She is found to have temperature of 100.2, hypokalemia, mildly elevated LFTs and bilirubin.  RUQ ultrasound with hepatic steatosis.  She was admitted on CIWA protocol.  Ultimately developed severe alcohol withdrawal, DTs, and started on Precedex on 7/18  She developed projectile vomiting x2 evening of 7/21 and noted to have distended abdomen. Abdominal x-ray with significantly dilated bowel loops. CT A/P w/o contrast dilated small bowel and colon with sigmoid colon and rectum decompressed. No obvious mass identified though study was without contrast. Also with moderate amount of low-density ascites with soft tissue stranding throughout the mesenteric fat and possible peritoneal nodularity in the right mid  abdomen. No  evidence of bowel perforation. Cannot exclude peritoneal carcinomatosis.  Dilated small bowel and colon:  Differential includes ileus, colonic obstruction, colonic pseudo obstruction, unable to rule out underlying malignancy. She continues to be quite distended with hypoactive bowel sounds. Had 850 ml output from NG tube yesterday. She is having some stool output. 200 ml in flexiseal this morning. She needs flex sig for decompression and further evaluation. Discussed with Dr. Levon Hedger who discussed with anesthesia. Will hold off on flex sig today and plan for procedure tomorrow in light of alcohol withdrawal requiring increasing Precedex today. Will repeat KUB today. Need to keep electrolytes corrected. Has mild hypokalemia today.   Ascites:  Of unclear etiology. Some concern for peritoneal carcinomatosis on CT. May need to consider paracentesis at some point for therapeutic and diagnostic purposes, but holding off on light of bowel dilation.     Plan: Plan for flexible sigmoidoscopy with propofol tomorrow with Dr. Jena Gauss.  Keep NGT in place with low intermittent suction.  Monitor electrolytes closely and keep corrected. Management per hospitalist.     LOS: 8 days    07/13/2023, 8:10 AM   Ermalinda Memos, PA-C [facility]

## 2023-07-13 NOTE — Anesthesia Postprocedure Evaluation (Signed)
Anesthesia Post Note  Patient: Heidi [name]  Procedure(s) Performed: FLEXIBLE SIGMOIDOSCOPY  Patient location during evaluation: PACU Anesthesia Type: General Level of consciousness: awake and alert Pain management: pain level controlled Vital Signs Assessment: post-procedure vital signs reviewed and stable Respiratory status: spontaneous breathing, nonlabored ventilation, respiratory function stable and patient connected to nasal cannula oxygen Cardiovascular status: blood pressure returned to baseline and stable Postop Assessment: no apparent nausea or vomiting Anesthetic complications: no   No notable events documented.   Last Vitals:  Vitals:   07/13/23 1615 07/13/23 1630  BP: 110/78 101/78  Pulse: 87 87  Resp: (!) 24 20  Temp: 37 C   SpO2: 95% 94%    Last Pain:  Vitals:   07/13/23 1615  TempSrc:   PainSc: Asleep                 Windell Norfolk

## 2023-07-13 NOTE — Progress Notes (Addendum)
PROGRESS NOTE    Heidi [name]  BTD:[id number] DOB: 04-22-1971 DOA: 07/05/2023 PCP: Rica Records, FNP   Brief Narrative:    Heidi [name] is a 52 y.o. female with medical history significant of hypertension, alcohol abuse, tobacco use disorder who presents to the emergency department from home via EMS due to complaints of generalized bodyaches, nausea, vomiting which she thinks was related to alcohol withdrawal.  She continues to have signs of alcohol withdrawal and tremors this morning.  She remains on CIWA protocol and requires continuous monitoring in stepdown unit.   Subjective:  The patient was seen and examined this morning, per nursing staffs still was agitated earlier this morning, increasing Precedex, placed back on restraints, utilizing Ativan  Yesterday patient had 2 episodes of projectile vomiting, abdominal distention NG tube in place Having minimum liquid diarrhea-   Assessment & Plan:   Principal Problem:   Alcohol withdrawal (HCC) Active Problems:   Nausea & vomiting   Essential hypertension   Transaminitis   Tobacco use disorder   Hypokalemia   Thrombocytopenia (HCC)   Hypothermia   Elevated LFTs   Colonic obstruction (HCC)  Assessment and Plan:   Alcohol withdrawal-with agitation, confusion, -Significant delay in improvement despite IV Precedex, IV Ativan -Needing restraints  -With tapering down Precedex patient becomes symptomatic, agitated tachypneic tachycardic -Difficult to manage, medication switched to IV including with IV fluids   -Continue on CIWA protocol Ativan -Discontinue IV Precedex -Which has been started on 07/08/2023 -Librium discontinued   POA: Alcohol level was < 10 -Continuing thiamine and multivitamin Continue fall precaution    Abdominal distention-ileus -dilated small bowel and colon -Abdomen remains distended, hypoactive bowel sounds NG tube in place, yielding 850 mL/past 24 hours, Stool output 200  amount and Flexi-Seal  Per GI: Plan for flexible sigmoidoscopy with Dr. Jena Gauss in AM    Associated with nausea vomiting x 2 yesterday, no vomiting per nursing staff -N.p.o. -On IV fluids _KUB and CT scan of abdomen pelvis has been obtained reporting dilated mobile and colon, ileus versus developing early obstruction -GI consulted -NG tube in place with suction   -As needed IV antiemetic   Transaminitis    Latest Ref Rng & Units 07/11/2023    4:26 AM 07/10/2023    4:47 AM 07/09/2023    7:57 AM  Hepatic Function  Total Protein 6.5 - 8.1 g/dL 6.7  6.8  7.1   Albumin 3.5 - 5.0 g/dL 3.2  3.4  3.7   AST 15 - 41 U/L 22  34  42   ALT 0 - 44 U/L 27  32  40   Alk Phosphatase 38 - 126 U/L 63  65  67   Total Bilirubin 0.3 - 1.2 mg/dL 0.9  0.9  1.2      --Improving Possibly related to patient's history of alcohol use disorder Continue to monitor liver enzymes Plan to obtain right upper quadrant ultrasound to evaluate for liver cirrhosis   Nausea and vomiting Worsening with abdominal distention positive ileus versus SBO -KUB and CT of abdomen reviewed -Continue as needed Zofran, Phenergan if worsen will add Reglan   Persistent hypokalemia  -Repleting with IV fluid    Thrombocytopenia Likely related to alcohol use -Platelets 68, 56... 105  Change heparin to SCDs Continue to monitor CBC   Hypothermia -1 episode during this admission WBC: 6.3, LA 0.8, Procal: 0.94, CXR - WNL  No Abx for now  Will  Check UA -normal limits  Essential hypertension - Hypotensive  Holding  irbesartan and Cardizem -Blood pressure has stabilized   Tobacco use disorder Continue nicotine patch Patient will be counseled on tobacco use disorder cessation when more stable    DVT prophylaxis: SCDs Code Status: Full Family Communication: None at bedside Disposition Plan:  Status is: Inpatient Remains inpatient appropriate because: Need for close monitoring.   Consultants:   None  Procedures:  None  Antimicrobials:  None    Objective: Vitals:   07/13/23 0900 07/13/23 1000 07/13/23 1100 07/13/23 1200  BP: 139/86 124/88 117/87 129/82  Pulse: 99  91 91  Resp:    (!) 26  Temp:   98.6 F (37 C)   TempSrc:   Axillary   SpO2: 97%  93% 94%  Weight:      Height:        Intake/Output Summary (Last 24 hours) at 07/13/2023 1229 Last data filed at 07/13/2023 0953 Gross per 24 hour  Intake 1662.6 ml  Output 1950 ml  Net -287.4 ml    Filed Weights   07/11/23 0441 07/12/23 0422 07/13/23 0500  Weight: 67 kg 67.6 kg 67.9 kg        General:  Somnolent  HEENT:  Normocephalic, PERRL, otherwise with in Normal limits   Neuro:  CNII-XII intact. , normal motor and sensation, reflexes intact   Lungs:   Clear to auscultation BL, Respirations unlabored,  No wheezes / crackles  Cardio:    S1/S2, RRR, No murmure, No Rubs or Gallops   Abdomen:  Soft, distended, diffuse mild tenderness  Muscular  skeletal:  Limited exam -global generalized weaknesses - in bed, able to move all 4 extremities,   2+ pulses,  symmetric, No pitting edema  Skin:  Dry, warm to touch, negative for any Rashes,  Wounds: Please see nursing documentation       Data Reviewed: I have personally reviewed following labs and imaging studies  CBC: Recent Labs  Lab 07/09/23 0757 07/10/23 0447 07/11/23 0426 07/12/23 0327 07/13/23 0532  WBC 6.3 4.4 4.3 2.2* 7.0  HGB 12.0 10.7* 11.0* 12.7 12.4  HCT 36.0 33.4* 33.4* 39.0 37.9  MCV 92.8 94.1 92.3 92.9 92.4  PLT 68* 56* 105* 112* 158   Basic Metabolic Panel: Recent Labs  Lab 07/07/23 0512 07/09/23 0757 07/10/23 0447 07/11/23 0426 07/12/23 0327 07/13/23 0532  NA 133* 141 142 136 133* 138  K 2.9* 3.7 4.0 3.1* 3.1* 3.4*  CL 102 113* 116* 111 107 107  CO2 23 20* 20* 19* 19* 21*  GLUCOSE 129* 119* 109* 115* 152* 127*  BUN 6 5* 7 <5* 6 9  CREATININE 0.53 0.32* 0.52 0.45 0.51 0.59  CALCIUM 9.5 9.9 9.6 9.2 8.9 9.7  MG 1.9  --    --  1.7  --  1.9   GFR: Estimated Creatinine Clearance: 78.7 mL/min (by C-G formula based on SCr of 0.59 mg/dL). Liver Function Tests: Recent Labs  Lab 07/07/23 0512 07/09/23 0757 07/10/23 0447 07/11/23 0426  AST 47* 42* 34 22  ALT 44 40 32 27  ALKPHOS 75 67 65 63  BILITOT 1.3* 1.2 0.9 0.9  PROT 7.1 7.1 6.8 6.7  ALBUMIN 3.9 3.7 3.4* 3.2*     Recent Results (from the past 240 hour(s))  MRSA Next Gen by PCR, Nasal     Status: None   Collection Time: 07/05/23 10:10 PM   Specimen: Nasal Mucosa; Nasal Swab  Result Value Ref Range Status   MRSA by PCR Next Gen  NOT DETECTED NOT DETECTED Final    Comment: (NOTE) The GeneXpert MRSA Assay (FDA approved for NASAL specimens only), is one component of a comprehensive MRSA colonization surveillance program. It is not intended to diagnose MRSA infection nor to guide or monitor treatment for MRSA infections. Test performance is not FDA approved in patients less than 109 years old. Performed at [facility], [address], [place], [place] [address]   Culture, blood (Routine X 2) w Reflex to ID Panel     Status: None   Collection Time: 07/05/23 11:45 PM   Specimen: BLOOD LEFT ARM  Result Value Ref Range Status   Specimen Description BLOOD LEFT ARM  Final   Special Requests   Final    BOTTLES DRAWN AEROBIC AND ANAEROBIC Blood Culture adequate volume   Culture   Final    NO GROWTH 5 DAYS Performed at [facility], [address] [place], [place] [address]    Report Status 07/11/2023 FINAL  Final  Culture, blood (Routine X 2) w Reflex to ID Panel     Status: None   Collection Time: 07/05/23 11:48 PM   Specimen: BLOOD LEFT HAND  Result Value Ref Range Status   Specimen Description BLOOD LEFT HAND  Final   Special Requests   Final    BOTTLES DRAWN AEROBIC AND ANAEROBIC Blood Culture adequate volume   Culture   Final    NO GROWTH 5 DAYS Performed at [facility], [address] [place], [place] [address]    Report Status 07/11/2023  FINAL  Final  Culture, blood (Routine X 2) w Reflex to ID Panel     Status: None (Preliminary result)   Collection Time: 07/09/23  9:09 AM   Specimen: BLOOD  Result Value Ref Range Status   Specimen Description BLOOD LEFT ASSIST CONTROL  Final   Special Requests   Final    BOTTLES DRAWN AEROBIC AND ANAEROBIC Blood Culture adequate volume   Culture   Final    NO GROWTH 4 DAYS Performed at [facility], [address], [place], [place] [address]    Report Status PENDING  Incomplete  Culture, blood (Routine X 2) w Reflex to ID Panel     Status: None (Preliminary result)   Collection Time: 07/09/23  9:09 AM   Specimen: BLOOD  Result Value Ref Range Status   Specimen Description BLOOD LEFT HAND  Final   Special Requests   Final    BOTTLES DRAWN AEROBIC AND ANAEROBIC Blood Culture adequate volume   Culture   Final    NO GROWTH 4 DAYS Performed at [facility], [address] [place], [place] [address]    Report Status PENDING  Incomplete      Scheduled Meds:  Chlorhexidine Gluconate Cloth  6 each Topical Daily   diltiazem  120 mg Oral Daily   folic acid  1 mg Oral Daily   metoprolol tartrate  5 mg Intravenous Q8H   multivitamin with minerals  1 tablet Oral Daily   nicotine  14 mg Transdermal Daily   polyethylene glycol  17 g Oral Daily   senna-docusate  1 tablet Oral BID     LOS: 8 days    Kendell Bane, MD [facility] Greater than 55 minutes of critical time was spent evaluating, reviewing labs, medications, drawn plan of care.  If 7PM-7AM, please contact night-coverage [email or web address]

## 2023-07-13 NOTE — Progress Notes (Signed)
Pt requiring bilateral wrist restraints and increase in Precedex as patient is hitting, kicking, scratching and biting multiple staff members, thus interfering with [place] being able to provide safe patient care. MD notified, order for restraints placed. Will continue to monitor.

## 2023-07-13 NOTE — Plan of Care (Signed)
Pt continuing to be very physically and verbally aggressive towards staff.

## 2023-07-13 NOTE — Consult Note (Signed)
Reason for Consult: dilated bowel Referring Physician: Jeanella Craze, MD  JESYCA WEISENBURGER is an 52 y.o. female.  HPI: Per chart review, this patient was admitted on 7/15 for generalized symptoms 2/2 alcohol withdrawal. She was started on CIWA protocol. On 7/22 she developed nausea and projectile vomiting. CT A/P and and Xrs showing severe small bowel and colonic dilation.   Per nurse, she has not complained of abdominal pain or nausea today or last night but history limited due to sedation. She was in the process of receiving an enema and has a rectal tube in place with ~531mL of output today. Her NGT output has been minimal today and dark brown.  Past Medical History:  Diagnosis Date   Alcohol withdrawal (HCC)    Cervical radiculopathy    Malignant hyperthermia     History reviewed. No pertinent surgical history.  Family History  Problem Relation Age of Onset   Stroke Father     Social History:  reports that she has been smoking cigarettes. She has a 10 pack-year smoking history. She has never used smokeless tobacco. She reports current alcohol use of about 42.0 standard drinks of alcohol per week. She reports that she does not use drugs.  Allergies: No Known Allergies  Medications: I have reviewed the patient's current medications.  Results for orders placed or performed during the hospital encounter of 07/05/23 (from the past 48 hour(s))  CBC     Status: Abnormal   Collection Time: 07/12/23  3:27 AM  Result Value Ref Range   WBC 2.2 (L) 4.0 - 10.5 K/uL   RBC 4.20 3.87 - 5.11 MIL/uL   Hemoglobin 12.7 12.0 - 15.0 g/dL   HCT 29.5 62.1 - 30.8 %   MCV 92.9 80.0 - 100.0 fL   MCH 30.2 26.0 - 34.0 pg   MCHC 32.6 30.0 - 36.0 g/dL   RDW 65.7 84.6 - 96.2 %   Platelets 112 (L) 150 - 400 K/uL   nRBC 0.0 0.0 - 0.2 %    Comment: Performed at [facility], [address], [place], [place] [address]  Basic metabolic panel     Status: Abnormal   Collection Time: 07/12/23  3:27 AM   Result Value Ref Range   Sodium 133 (L) 135 - 145 mmol/L   Potassium 3.1 (L) 3.5 - 5.1 mmol/L   Chloride 107 98 - 111 mmol/L   CO2 19 (L) 22 - 32 mmol/L   Glucose, Bld 152 (H) 70 - 99 mg/dL    Comment: Glucose reference range applies only to samples taken after fasting for at least 8 hours.   BUN 6 6 - 20 mg/dL   Creatinine, Ser 1.32 0.44 - 1.00 mg/dL   Calcium 8.9 8.9 - 44.0 mg/dL   GFR, Estimated >10 >27 mL/min    Comment: (NOTE) Calculated using the CKD-EPI Creatinine Equation (2021)    Anion gap 7 5 - 15    Comment: Performed at [facility], [address] [place], [place] [address]  Lipase, blood     Status: None   Collection Time: 07/12/23  3:27 AM  Result Value Ref Range   Lipase 41 11 - 51 U/L    Comment: Performed at [facility], [address], [place], [place] [address]  Lactic acid, plasma     Status: None   Collection Time: 07/12/23  4:10 PM  Result Value Ref Range   Lactic Acid, Venous 1.6 0.5 - 1.9 mmol/L    Comment: Performed at  [facility], [address], [place], [place] [address]  CBC     Status: None   Collection Time: 07/13/23  5:32 AM  Result Value Ref Range   WBC 7.0 4.0 - 10.5 K/uL   RBC 4.10 3.87 - 5.11 MIL/uL   Hemoglobin 12.4 12.0 - 15.0 g/dL   HCT 60.4 54.0 - 98.1 %   MCV 92.4 80.0 - 100.0 fL   MCH 30.2 26.0 - 34.0 pg   MCHC 32.7 30.0 - 36.0 g/dL   RDW 19.1 47.8 - 29.5 %   Platelets 158 150 - 400 K/uL   nRBC 0.0 0.0 - 0.2 %    Comment: Performed at [facility], [address] [place], [place] [address]  Basic metabolic panel     Status: Abnormal   Collection Time: 07/13/23  5:32 AM  Result Value Ref Range   Sodium 138 135 - 145 mmol/L   Potassium 3.4 (L) 3.5 - 5.1 mmol/L   Chloride 107 98 - 111 mmol/L   CO2 21 (L) 22 - 32 mmol/L   Glucose, Bld 127 (H) 70 - 99 mg/dL    Comment: Glucose reference range applies only to samples taken after fasting for at least 8 hours.   BUN 9 6 - 20 mg/dL   Creatinine, Ser 8.65 0.44 - 1.00 mg/dL    Calcium 9.7 8.9 - 78.4 mg/dL   GFR, Estimated >69 >62 mL/min    Comment: (NOTE) Calculated using the CKD-EPI Creatinine Equation (2021)    Anion gap 10 5 - 15    Comment: Performed at [facility], [address] [place], [place] [address]  Magnesium     Status: None   Collection Time: 07/13/23  5:32 AM  Result Value Ref Range   Magnesium 1.9 1.7 - 2.4 mg/dL    Comment: Performed at [facility], [address] [place], [place] [address]  Lactic acid, plasma     Status: None   Collection Time: 07/13/23  1:02 PM  Result Value Ref Range   Lactic Acid, Venous 1.6 0.5 - 1.9 mmol/L    Comment: Performed at [facility], [address], [place], [place] [address]    DG Abd 1 View  Result Date: 07/12/2023 CLINICAL DATA:  Nasogastric tube placement. EXAM: ABDOMEN - 1 VIEW COMPARISON:  Radiograph and CT earlier today FINDINGS: Tip and side port of the enteric tube below the diaphragm in the stomach. Gaseous bowel distension persists throughout the abdomen. IMPRESSION: Tip and side port of the enteric tube below the diaphragm in the stomach. Electronically Signed   By: Narda Rutherford M.D.   On: 07/12/2023 17:06   CT ABDOMEN PELVIS WO CONTRAST  Result Date: 07/12/2023 CLINICAL DATA:  Abdominal pain, acute. EXAM: CT ABDOMEN AND PELVIS WITHOUT CONTRAST TECHNIQUE: Multidetector CT imaging of the abdomen and pelvis was performed following the standard protocol without IV contrast. RADIATION DOSE REDUCTION: This exam was performed according to the departmental dose-optimization program which includes automated exposure control, adjustment of the mA and/or kV according to patient size and/or use of iterative reconstruction technique. COMPARISON:  Abdominal radiographs 07/12/2023. Abdominal ultrasound 07/06/2023. FINDINGS: Lower chest: Trace bilateral pleural effusions with associated dependent atelectasis at both lung bases. No consolidation or significant pericardial fluid. There is moderate distal  esophageal wall thickening which is nonspecific. Hepatobiliary: No suspicious hepatic findings are identified on noncontrast imaging. In the dome of the left hepatic lobe, there is a 9 mm low-density lesion which is likely a cyst. No evidence  of gallstones, gallbladder wall thickening or biliary dilatation. Pancreas: Unremarkable. No pancreatic ductal dilatation or surrounding inflammatory changes. Spleen: Normal in size without focal abnormality. Adrenals/Urinary Tract: Both adrenal glands appear normal. No evidence of urinary tract calculus, suspicious renal lesion or hydronephrosis. Mild nonspecific perinephric soft tissue stranding bilaterally. The bladder appears unremarkable for its degree of distention. Stomach/Bowel: There may be a minimal amount of enteric contrast dependently in the proximal stomach. The stomach and proximal small bowel are not significantly distended. However, as seen on earlier radiographs, there are multiple dilated and fluid-filled loops of mid to distal small bowel and colon. The colon is dilated into the mid descending colon. The sigmoid colon and rectum are relatively decompressed, although did no focal transition point or obvious colonic mass is identified on this noncontrast study. There is no bowel wall thickening or pneumatosis. There is some high density material within the lumen of the appendix, and the appendix appears mildly distended, although demonstrates no wall thickening or focal surrounding inflammation. A rectal tube is in place. Vascular/Lymphatic: There are no enlarged abdominal or pelvic lymph nodes. Aortic and branch vessel atherosclerosis without evidence of aneurysm. Vascular patency not addressed without contrast. Reproductive: The uterus and ovaries appear normal. No adnexal mass. Other: Moderate amount of low-density ascites. There is soft tissue stranding throughout the mesenteric fat. Possible peritoneal nodularity in the right mid abdomen measuring up to  2.3 x 1.2 cm on image 44/2. No pneumoperitoneum or abdominal wall hernia identified. Musculoskeletal: No acute or significant osseous findings. Unless specific follow-up recommendations are mentioned in the findings or impression sections, no imaging follow-up of any mentioned incidental findings is recommended. IMPRESSION: 1. Multiple dilated and fluid-filled loops of mid to distal small bowel and colon with relative decompression of the sigmoid colon and rectum. No focal transition point or obvious colonic mass is identified on this study which is limited by the lack of intravenous and enteric contrast. Findings could be secondary to an ileus from underlying peritonitis, although are concerning for possible distal colonic obstruction. Recommend further evaluation with colonoscopy. 2. Moderate amount of low-density ascites with soft tissue stranding throughout the mesenteric fat and possible peritoneal nodularity in the right mid abdomen. No evidence of bowel perforation. Cannot exclude peritoneal carcinomatosis. 3. Mildly prominent appendix without evidence of acute inflammation. 4. Trace bilateral pleural effusions with associated dependent atelectasis at both lung bases. 5. Distal esophageal wall thickening, nonspecific, but potentially related to esophagitis. 6.  Aortic Atherosclerosis (ICD10-I70.0). Electronically Signed   By: Carey Bullocks M.D.   On: 07/12/2023 13:24   DG Abd 1 View  Result Date: 07/12/2023 CLINICAL DATA:  Abdominal pain EXAM: ABDOMEN - 1 VIEW COMPARISON:  None Available. FINDINGS: Significantly dilated bowel loops. No definite free air on this limited supine radiograph. No abnormal calcifications identified although bowel loops limited assessment. Lower lumbar degenerative change. IMPRESSION: Significantly dilated bowel loops which could represent obstruction or ileus. Consider CT of the abdomen/pelvis for further evaluation. Electronically Signed   By: Feliberto Harts M.D.   On:  07/12/2023 08:31    ROS:  Pertinent items are noted in HPI.  Blood pressure 108/74, pulse 86, temperature 98.6 F (37 C), temperature source Axillary, resp. rate (!) 24, height 5\' 3"  (1.6 m), weight 67.9 kg, SpO2 95%. Physical Exam:  General: somnolent with b/l restraints in place HEENT: pupils anicteric, subconjunctival pallor noted. NGT with minimal dark brown output CV: tachycardic, regular rhythm no murmurs. Distal pulses present Pulm: clear to anterior auscultation Abdominal:  bowel sounds not heard. Tympanitic abdomen. TTP on the right side, no rebound, or guarding (but patient under sedation) Rectal: flexseal in place with appropriate drainage Psych: sedated   Assessment/Plan: KIMBALL MANSKE is a 52 y.o. year old female with history of alcohol abuse (on CIWA protocol) who developed N/V two days ago with imaging c/w small bowel and colonic dilation. Differential includes ileus and distal obstruction. Flexiseal and NGT output reassuring and patient currently without signs of perforation or systemic illness. Agree with decompressive flex sig tomorrow given degree of distention. Anticipate this is likely an ileus that will resolve with time and therapies below.  - Flex sig tomorrow, currently receiving enema - NPO - Continue IVF - Cotinue NGT - Continue electrolyte repletion (Mg >2 Phos >3 K >4) - Continue CIWA protocol   Dominica Severin 07/13/2023, 3:25 PM

## 2023-07-13 NOTE — Transfer of Care (Signed)
Immediate Anesthesia Transfer of Care Note  Patient: Heidi [name]  Procedure(s) Performed: FLEXIBLE SIGMOIDOSCOPY  Patient Location: PACU  Anesthesia Type:General  Level of Consciousness: sedated  Airway & Oxygen Therapy: Patient Spontanous Breathing  Post-op Assessment: Report given to RN and Post -op Vital signs reviewed and stable  Post vital signs: Reviewed and stable  Last Vitals:  Vitals Value Taken Time  BP 101/65   Temp 98   Pulse 81   Resp 16   SpO2 99     Last Pain:  Vitals:   07/13/23 1615  TempSrc:   PainSc: Asleep      Patients Stated Pain Goal: 0 (07/12/23 0000)  Complications: No notable events documented.

## 2023-07-13 NOTE — Anesthesia Preprocedure Evaluation (Signed)
Anesthesia Evaluation  Patient identified by MRN, date of birth, ID band Patient awake    Reviewed: Allergy & Precautions, H&P , NPO status , Patient's Chart, lab work & pertinent test results, reviewed documented beta blocker date and time   History of Anesthesia Complications (+) MALIGNANT HYPERTHERMIA and history of anesthetic complications  Airway Mallampati: II  TM Distance: >3 FB Neck ROM: full    Dental no notable dental hx.    Pulmonary neg pulmonary ROS, Current Smoker   Pulmonary exam normal breath sounds clear to auscultation       Cardiovascular Exercise Tolerance: Good hypertension, negative cardio ROS  Rhythm:regular Rate:Normal     Neuro/Psych  PSYCHIATRIC DISORDERS  Depression     Neuromuscular disease negative neurological ROS  negative psych ROS   GI/Hepatic negative GI ROS,GERD  ,,(+)     substance abuse  alcohol use  Endo/Other  negative endocrine ROS    Renal/GU negative Renal ROS  negative genitourinary   Musculoskeletal   Abdominal   Peds  Hematology negative hematology ROS (+)   Anesthesia Other Findings MH - Avoid triggering agents  Reproductive/Obstetrics negative OB ROS                             Anesthesia Physical Anesthesia Plan  ASA: 4 and emergent  Anesthesia Plan: General   Post-op Pain Management:    Induction:   PONV Risk Score and Plan:   Airway Management Planned:   Additional Equipment:   Intra-op Plan:   Post-operative Plan:   Informed Consent: I have reviewed the patients History and Physical, chart, labs and discussed the procedure including the risks, benefits and alternatives for the proposed anesthesia with the patient or authorized representative who has indicated his/her understanding and acceptance.     Dental Advisory Given  Plan Discussed with: CRNA  Anesthesia Plan Comments:        Anesthesia Quick  Evaluation

## 2023-07-13 NOTE — Op Note (Signed)
[facility] Patient Name: Heidi [name] Procedure Date: 07/13/2023 4:52 PM MRN: [id number] Date of Birth: 14-Sep-1971 Attending MD: Katrinka Blazing , , [id number] CSN: [id number] Age: 52 Admit Type: Inpatient Procedure:                Flexible Sigmoidoscopy Indications:              Abnormal CT of the GI tract, ileus Providers:                Katrinka Blazing, Nena Polio, RN, Cyril Mourning, Technician Referring MD:              Medicines:                Monitored Anesthesia Care Complications:            No immediate complications. Estimated Blood Loss:     Estimated blood loss: none. Procedure:                Pre-Anesthesia Assessment:                           - Prior to the procedure, a History and Physical                            was performed, and patient medications, allergies                            and sensitivities were reviewed. The patient's                            tolerance of previous anesthesia was reviewed.                           - The risks and benefits of the procedure and the                            sedation options and risks were discussed with the                            patient. All questions were answered and informed                            consent was obtained.                           - ASA Grade Assessment: III - A patient with severe                            systemic disease.                           After obtaining informed consent, the scope was                            passed under direct vision. The  [id number]                            ([id number]) was introduced through the anus and                            advanced to the the left transverse colon. The                            flexible sigmoidoscopy was accomplished without                            difficulty. The patient tolerated the procedure                            well. Scope In: 5:20:05 PM Scope Out: 5:44:16 PM Total  Procedure Duration: 0 hours 24 minutes 11 seconds  Findings:      The perianal and digital rectal examinations were normal. Rectal tube       was removed.      A patchy area of erythematous mucosa was found in the transverse colon.       No changes concerning for necrosis.      The lumen of the descending colon and transverse colon was dilated. No       masses were seen. Fluid and gas aspiration was performed.      A large amount of stool was found in the rectum, in the sigmoid colon,       in the descending colon and in the transverse colon, precluding       visualization. Impression:               - Erythematous mucosa in the transverse colon. No                            changes concerning for necrosis.                           - Dilated in the descending colon and in the                            transverse colon. Fluid aspiration performed.                           - Stool in the rectum, in the sigmoid colon, in the                            descending colon and in the transverse colon. Moderate Sedation:      Per Anesthesia Care Recommendation:           - Return patient to hospital ward for ongoing care.                           - NPO.                           - Mobilize patient from one side to the other every  1-2 hours                           - Minimize opiate o anticholinergic use                           - Keep K >4.0 and Mg >2.0                           - Perform KUB. Procedure Code(s):        --- Professional ---                           [phone], Sigmoidoscopy, flexible; diagnostic,                            including collection of specimen(s) by brushing or                            washing, when performed (separate procedure) Diagnosis Code(s):        --- Professional ---                           K63.89, Other specified diseases of intestine                           K59.39, Other megacolon                           R93.3,  Abnormal findings on diagnostic imaging of                            other parts of digestive tract CPT copyright 2022 [employer]. All rights reserved. The codes documented in this report are preliminary and upon coder review may  be revised to meet current compliance requirements. Katrinka Blazing, MD Katrinka Blazing,  07/13/2023 5:50:49 PM This report has been signed electronically. Number of Addenda: 0

## 2023-07-13 NOTE — Brief Op Note (Signed)
07/13/2023  5:48 PM  PATIENT:  Heidi [name]  53 y.o. female  PRE-OPERATIVE DIAGNOSIS:  possible colon obstruction, dilated and fluid-filled loops of mid to distal small bowel and colon with relative decompression of the sigmoid colon and rectum on CT.  POST-OPERATIVE DIAGNOSIS:  colonic distention from sigmoid to transverse; decompressed;  PROCEDURE:  Procedure(s): FLEXIBLE SIGMOIDOSCOPY (N/A)  SURGEON:  Surgeons and Role:    * Dolores Frame, MD - Primary  Patient underwent flexible sigmoidoscopy under propofol sedation.  Tolerated the procedure adequately.   FINDINGS: - Erythematous mucosa in the transverse colon. No changes concerning for necrosis. - Dilated in the descending colon and in the transverse colon.  Fluid aspiration performed.  - Stool in the rectum, in the sigmoid colon, in the descending colon and in the transverse colon.   RECOMMENDATIONS - Return patient to hospital ward for ongoing care.  - NPO.  - Mobilize patient from one side to the other every 1-2 hours - Minimize opiate o anticholinergic use - Keep K >4.0 and Mg >2.0 - Perform KUB.  Katrinka Blazing, MD Gastroenterology and Hepatology [facility] Gastroenterology

## 2023-07-14 ENCOUNTER — Inpatient Hospital Stay (HOSPITAL_COMMUNITY): Payer: Medicaid Other

## 2023-07-14 ENCOUNTER — Other Ambulatory Visit: Payer: Self-pay

## 2023-07-14 DIAGNOSIS — F17200 Nicotine dependence, unspecified, uncomplicated: Secondary | ICD-10-CM | POA: Diagnosis not present

## 2023-07-14 DIAGNOSIS — K567 Ileus, unspecified: Secondary | ICD-10-CM | POA: Diagnosis not present

## 2023-07-14 DIAGNOSIS — E876 Hypokalemia: Secondary | ICD-10-CM | POA: Diagnosis not present

## 2023-07-14 DIAGNOSIS — F10931 Alcohol use, unspecified with withdrawal delirium: Secondary | ICD-10-CM | POA: Diagnosis not present

## 2023-07-14 DIAGNOSIS — R112 Nausea with vomiting, unspecified: Secondary | ICD-10-CM | POA: Diagnosis not present

## 2023-07-14 DIAGNOSIS — D696 Thrombocytopenia, unspecified: Secondary | ICD-10-CM | POA: Diagnosis not present

## 2023-07-14 LAB — URINALYSIS, W/ REFLEX TO CULTURE (INFECTION SUSPECTED)
Bacteria, UA: NONE SEEN
Bilirubin Urine: NEGATIVE
Glucose, UA: NEGATIVE mg/dL
Hgb urine dipstick: NEGATIVE
Ketones, ur: NEGATIVE mg/dL
Leukocytes,Ua: NEGATIVE
Nitrite: NEGATIVE
Protein, ur: 30 mg/dL — AB
Specific Gravity, Urine: 1.03 (ref 1.005–1.030)
pH: 5 (ref 5.0–8.0)

## 2023-07-14 LAB — COMPREHENSIVE METABOLIC PANEL
ALT: 19 U/L (ref 0–44)
AST: 21 U/L (ref 15–41)
Albumin: 2.8 g/dL — ABNORMAL LOW (ref 3.5–5.0)
Alkaline Phosphatase: 53 U/L (ref 38–126)
Anion gap: 8 (ref 5–15)
BUN: 13 mg/dL (ref 6–20)
CO2: 22 mmol/L (ref 22–32)
Calcium: 9.1 mg/dL (ref 8.9–10.3)
Chloride: 112 mmol/L — ABNORMAL HIGH (ref 98–111)
Creatinine, Ser: 0.47 mg/dL (ref 0.44–1.00)
GFR, Estimated: >60 mL/min (ref >60)
Glucose, Bld: 136 mg/dL — ABNORMAL HIGH (ref 70–99)
Potassium: 3.4 mmol/L — ABNORMAL LOW (ref 3.5–5.1)
Sodium: 142 mmol/L (ref 135–145)
Total Bilirubin: 0.8 mg/dL (ref 0.3–1.2)
Total Protein: 6 g/dL — ABNORMAL LOW (ref 6.5–8.1)

## 2023-07-14 MED ORDER — BISACODYL 10 MG RE SUPP
10.0000 mg | Freq: Every day | RECTAL | Status: DC
  Administered 2023-07-14 – 2023-07-20 (×7): 10 mg via RECTAL
  Filled 2023-07-14 (×9): qty 1

## 2023-07-14 MED ORDER — CLONIDINE HCL 0.1 MG/24HR TD PTWK
0.1000 mg | MEDICATED_PATCH | TRANSDERMAL | Status: DC
  Administered 2023-07-14: 0.1 mg via TRANSDERMAL
  Filled 2023-07-14: qty 1

## 2023-07-14 MED ORDER — CLONIDINE HCL 0.1 MG PO TABS
0.1000 mg | ORAL_TABLET | Freq: Three times a day (TID) | ORAL | Status: DC | PRN
  Administered 2023-07-16 – 2023-07-18 (×2): 0.1 mg
  Filled 2023-07-14 (×2): qty 1

## 2023-07-14 MED ORDER — THIAMINE HCL 100 MG/ML IJ SOLN
500.0000 mg | Freq: Three times a day (TID) | INTRAVENOUS | Status: AC
  Administered 2023-07-14 – 2023-07-15 (×4): 500 mg via INTRAVENOUS
  Filled 2023-07-14 (×4): qty 5

## 2023-07-14 NOTE — Progress Notes (Signed)
Initial Nutrition Assessment  DOCUMENTATION CODES:   Not applicable  INTERVENTION:  Plan is to initiate TPN per pharmacy tomorrow.   Continue high-dose IV thiamine per MD. After this is completed, recommend providing thiamine 100 mg IV daily x 5-7 days due to risk for refeeding syndrome.  Consider providing folic acid 1 mg daily IV as pt is NPO.  Patient will be at risk for refeeding syndrome. Recommend monitoring potassium, phosphorus, and magnesium daily x at least 3 days, MD to replace as needed. If baseline phosphorus is low, recommend replacing prior to initiating TPN.  Recommend continuing to measure daily weights on TPN.  NUTRITION DIAGNOSIS:   Inadequate oral intake related to inability to eat as evidenced by NPO status.  GOAL:   Patient will meet greater than or equal to 90% of their needs  MONITOR:   Diet advancement, Labs, Weight trends, I & O's  REASON FOR ASSESSMENT:   NPO/Clear Liquid Diet    ASSESSMENT:   52 year old female with PMHx of HTN, EtOH abuse, tobacco use disorder admitted with generalized body aches, nausea, emesis, EtOH withdrawal. Found to have acute metabolic encephalopathy/delirium tremens, abdominal ileus.  7/15: admitted to hospital with concern for EtOH withdrawal 7/16: ordered for heart healthy diet 7/17: escalated care to ICU and Precedex started for worsening agitation 7/22: CT abd/pelvis with multiple dilated fluid-filled loops of small bowel and colon; pt made NPO 7/23: PCCM consult for agitation and encephalopathy; s/p flex sig with no obstruction noted 7/24: NG tube inserted to LIS  Per review of chart pt is on Precedex gtt and in soft restraints. Speech incoherent. Concern for ileus. NG tube replaced for decompression as pt is not passing much stool/flatus and abdomen is distended. Pt has been NPO since 7/22. Prior to that pt had PO intake mostly 5-25% with one meal documented to be 50%. Pt has now had 8 days of inadequate  nutrition. Discussed with team via secure chat and plan is to initiate TPN. As it is past the cut off to initiate TPN today, this would be initiated tomorrow.  Per review of chart weights PTA were 61-64 kg over the past year. Admission wt 65.8 kg. Currently 67.2 kg.  Medications reviewed and include: clonidine patch, folic acid 1 mg daily (not being given as pt is NPO), MVI daily (not being given as pt is NPO), nicotine patch, Miralax (not being given as pt is nPO), senna-docusate (not being given as pt is NPO), Precedex gtt, D5-NS at 75 mL/hour, thiamine 500 mg IV every 8 hours started on 7/22  Labs reviewed: Potassium 3.4, Chloride 112 On 7/16 phosphorus 2.8  IV Access: currently with two peripheral Ivs Order in to place PICC line  UOP: 1 occurrence unmeasured UOP x 24 hours  I/O: +3903.9 ML since admission  NUTRITION - FOCUSED PHYSICAL EXAM:  Unable to complete as RD is working remotely   Diet Order:   Diet Order             Diet NPO time specified  Diet effective now                  EDUCATION NEEDS:   No education needs have been identified at this time  Skin:  Skin Assessment: Reviewed RN Assessment  Last BM:  07/13/23  Height:   Ht Readings from Last 1 Encounters:  07/05/23 5\' 3"  (1.6 m)   Weight:   Wt Readings from Last 1 Encounters:  07/14/23 67.2 kg  Ideal Body Weight:  52.3 kg  BMI:  Body mass index is 26.24 kg/m.  Estimated Nutritional Needs:   Kcal:  1700-1900  Protein:  85-95 grams  Fluid:  1.7-1.9 L/day  Letta Median, MS, RD, LDN, CNSC Pager number available on Amion

## 2023-07-14 NOTE — Progress Notes (Deleted)
Over night events, per night shift RN, pt pulled out NG tube and refusing to get another one placed.

## 2023-07-14 NOTE — Hospital Course (Addendum)
52 y.o. female with medical history significant of hypertension, alcohol abuse, tobacco use disorder who presents to the emergency department from home via EMS due to complaints of generalized bodyaches, nausea, vomiting which she thinks was related to alcohol withdrawal. Patient usually drinks 4 40 ounce beers daily, but she only had 2 12 ounce beers today.  Patient complained of pain in chest, legs, arms and endorsed having had some falls without any injury.  Her hospitalization has been prolonged secondary to delirium and agitation presumably from alcohol withdrawal.  She has been on Precedex since 07/07/2023.  Attempts at weaning had resulted in increased agitation.  Unfortunately, her hospitalization has been prolonged secondary to development of ileus.  NG was inserted to low intermittent suction on 07/14/2023.  PICC line placed on 07/15/23 and TPN was initiated due to prolonged period of no oral intake and prolonged ileus.   On 07/16/23, pt began showing signs of return of bowel function with 3 BMs in last 24 hours.  She developed a fever up to 102.2.  UA was negative.  Blood cultures were drawn and pt started on empiric unasyn  On 07/17/2023 in the late morning, the patient's Precedex was weaned off.  The patient remained fairly lucid and redirectable. She had one BM in last 24 hours.  TPN continues  On 07/10/2023, the patient continued to have fever up to 101.3 F.  She has had 2 bowel movements in last 24 hours.  She had NG output of 1150, but abdomen was less distended.

## 2023-07-14 NOTE — Progress Notes (Addendum)
NG tube placed with the assistance of Katie, RN, [employer], and [place] NT. Pt resistant at first but able to console to get one placed. ABD portable scan placed.

## 2023-07-14 NOTE — Progress Notes (Signed)
1 Day Post-Op  Subjective: Patient is a poor historian but notes not having abdominal pain atm. She was complaining of her restraints. Patient nodding her head when told about her abdominal ileus. She cannot endorse any flatus.  Objective: Vital signs in last 24 hours: Temp:  [97.3 F (36.3 C)-98.7 F (37.1 C)] 97.6 F (36.4 C) (07/24 1113) Pulse Rate:  [47-91] 73 (07/24 1300) Resp:  [9-31] 22 (07/24 1300) BP: (92-168)/(71-105) 161/105 (07/24 1300) SpO2:  [58 %-100 %] 100 % (07/24 1300) Weight:  [67.2 kg] 67.2 kg (07/24 0500) Last BM Date : 07/13/23  Intake/Output from previous day: 07/23 0701 - 07/24 0700 In: 1758.9 [I.V.:991.7; IV Piggyback:167.2] Out: 525 [Emesis/NG output:75; Stool:450] Intake/Output this shift: No intake/output data recorded.  General appearance: delirious, mild distress, and slowed mentation Resp: clear to auscultation bilaterally Cardio: regular rate and rhythm GI: abnormal findings:  distended and hypoactive bowel sounds. Soft, and minimal TTP but difficult to assess  Pulses: 2+ and symmetric Neurologic: Mental status: alertness: obtunded  Lab Results:  Recent Labs    07/12/23 0327 07/13/23 0532  WBC 2.2* 7.0  HGB 12.7 12.4  HCT 39.0 37.9  PLT 112* 158   BMET Recent Labs    07/13/23 0532 07/14/23 0456  NA 138 142  K 3.4* 3.4*  CL 107 112*  CO2 21* 22  GLUCOSE 127* 136*  BUN 9 13  CREATININE 0.59 0.47  CALCIUM 9.7 9.1   PT/INR No results for input(s): "LABPROT", "INR" in the last 72 hours.  Studies/Results: DG Abd Portable 1V  Result Date: 07/14/2023 CLINICAL DATA:  NG tube placement EXAM: PORTABLE ABDOMEN - 1 VIEW COMPARISON:  Previous studies including the examination none earlier today FINDINGS: There is interval placement of NG tube with its tip and side port within the stomach. Dilation of small-bowel loops in central abdomen has not changed. Gas is present in ascending and transverse colon. IMPRESSION: Tip of enteric tube is  seen in the stomach. No change is seen in small bowel dilation. Electronically Signed   By: Ernie Avena M.D.   On: 07/14/2023 13:11   DG Abd 1 View  Result Date: 07/14/2023 CLINICAL DATA:  Abdominal distention EXAM: ABDOMEN - 1 VIEW COMPARISON:  Previous studies including the examination of 07/13/2023 FINDINGS: There is dilation of small-bowel loops in upper and mid abdomen. Gas is present in transverse colon. There is no demonstrable gas in rectosigmoid. No significant changes are noted in small bowel dilation. IMPRESSION: There is dilation of small bowel loops suggesting possible ileus or partial obstruction. No significant changes are noted. Electronically Signed   By: Ernie Avena M.D.   On: 07/14/2023 13:09   DG Abd 1 View  Result Date: 07/13/2023 CLINICAL DATA:  Follow-up ileus, initial encounter EXAM: ABDOMEN - 1 VIEW COMPARISON:  None Available. FINDINGS: Scattered large and small bowel gas is noted. Multiple dilated loops of small bowel are again noted similar to that seen on the prior exam. No free air is seen. No mass lesion is noted. No bony abnormality is seen. IMPRESSION: Stable small bowel dilatation centrally. Electronically Signed   By: Alcide Clever M.D.   On: 07/13/2023 20:09   DG Abd 1 View  Result Date: 07/13/2023 CLINICAL DATA:  Abdominal distension. EXAM: ABDOMEN - 1 VIEW COMPARISON:  07/12/2023 FINDINGS: Persistent gaseous distension of bowel loops throughout the abdomen and pelvis. The degree of bowel distension has minimally changed. Upper abdomen is incompletely imaged and not clear if nasogastric tube is still  present. Cannot evaluate for free air on the supine image. IMPRESSION: Persistent gaseous distension of small and large bowel loops. Based on the previous CT findings, a distal colonic obstruction cannot be excluded. Bowel gas distension has minimally changed. Electronically Signed   By: Richarda Overlie M.D.   On: 07/13/2023 16:33   DG Abd 1 View  Result  Date: 07/12/2023 CLINICAL DATA:  Nasogastric tube placement. EXAM: ABDOMEN - 1 VIEW COMPARISON:  Radiograph and CT earlier today FINDINGS: Tip and side port of the enteric tube below the diaphragm in the stomach. Gaseous bowel distension persists throughout the abdomen. IMPRESSION: Tip and side port of the enteric tube below the diaphragm in the stomach. Electronically Signed   By: Narda Rutherford M.D.   On: 07/12/2023 17:06    Anti-infectives: Anti-infectives (From admission, onward)    None       Assessment/Plan: s/p Procedure(s): FLEXIBLE SIGMOIDOSCOPY  Patient underwent flex sig yesterday which showed erythematous mucosa in the transverse colon, dilation in the descending and transverse, and significant stool burden. No obstruction or necrosis was noted and fluid was aspirated. The patient is more awake today and does not endorse abdominal pain but she is a difficult historian. NGT output has been minimal and there is little data documented for stool output (anticipate is from yesterday). Flexiseal no longer in place. Abdominal exam improved from yesterday and KUB also with slight interval improvement. CTM her ileus and advise strict electrolyte repletion, particularly potassium.  - Continue NGT - NPO - Continue IVF - Electrolyte repletion (Mg >2, Phos >3, K >4) - AM KUB  LOS: 9 days    Dominica Severin 07/14/2023

## 2023-07-14 NOTE — Progress Notes (Addendum)
Subjective: Patient very groggy during our encounter, somewhat alert but speech incoherent. Remains on precedex and in soft restraints.   Objective: Vital signs in last 24 hours: Temp:  [97.3 F (36.3 C)-98.7 F (37.1 C)] 97.3 F (36.3 C) (07/24 0815) Pulse Rate:  [47-92] 73 (07/24 0728) Resp:  [13-32] 26 (07/24 0728) BP: (92-154)/(71-100) 154/100 (07/24 0728) SpO2:  [58 %-100 %] 100 % (07/24 0728) Weight:  [67.2 kg] 67.2 kg (07/24 0500) Last BM Date : 07/13/23 General:   alert, groggy, incoherent  Head:  Normocephalic and atraumatic. Eyes:  No icterus, sclera clear. Conjuctiva pink.  Heart:  S1, S2 present, no murmurs noted.  Lungs: Clear to auscultation bilaterally, without wheezing, rales, or rhonchi.  Abdomen:  distended, very minimal bowel sounds in upper abdomen, none heard otherwise. NG tube noted. Msk:  Symmetrical without gross deformities. Normal posture. Extremities:  Without clubbing or edema. Neurologic:  minimally allert  Skin:  Warm and dry, intact without significant lesions.  Psych:  Alert, speech incoherent   Intake/Output from previous day: 07/23 0701 - 07/24 0700 In: 1758.9 [I.V.:991.7; IV Piggyback:167.2] Out: 525 [Emesis/NG output:75; Stool:450] Intake/Output this shift: No intake/output data recorded.  Lab Results: Recent Labs    07/12/23 0327 07/13/23 0532  WBC 2.2* 7.0  HGB 12.7 12.4  HCT 39.0 37.9  PLT 112* 158   BMET Recent Labs    07/12/23 0327 07/13/23 0532 07/14/23 0456  NA 133* 138 142  K 3.1* 3.4* 3.4*  CL 107 107 112*  CO2 19* 21* 22  GLUCOSE 152* 127* 136*  BUN 6 9 13   CREATININE 0.51 0.59 0.47  CALCIUM 8.9 9.7 9.1   LFT Recent Labs    07/14/23 0456  PROT 6.0*  ALBUMIN 2.8*  AST 21  ALT 19  ALKPHOS 53  BILITOT 0.8   Studies/Results: DG Abd 1 View  Result Date: 07/13/2023 CLINICAL DATA:  Follow-up ileus, initial encounter EXAM: ABDOMEN - 1 VIEW COMPARISON:  None Available. FINDINGS: Scattered large and small  bowel gas is noted. Multiple dilated loops of small bowel are again noted similar to that seen on the prior exam. No free air is seen. No mass lesion is noted. No bony abnormality is seen. IMPRESSION: Stable small bowel dilatation centrally. Electronically Signed   By: Alcide Clever M.D.   On: 07/13/2023 20:09   DG Abd 1 View  Result Date: 07/13/2023 CLINICAL DATA:  Abdominal distension. EXAM: ABDOMEN - 1 VIEW COMPARISON:  07/12/2023 FINDINGS: Persistent gaseous distension of bowel loops throughout the abdomen and pelvis. The degree of bowel distension has minimally changed. Upper abdomen is incompletely imaged and not clear if nasogastric tube is still present. Cannot evaluate for free air on the supine image. IMPRESSION: Persistent gaseous distension of small and large bowel loops. Based on the previous CT findings, a distal colonic obstruction cannot be excluded. Bowel gas distension has minimally changed. Electronically Signed   By: Richarda Overlie M.D.   On: 07/13/2023 16:33   DG Abd 1 View  Result Date: 07/12/2023 CLINICAL DATA:  Nasogastric tube placement. EXAM: ABDOMEN - 1 VIEW COMPARISON:  Radiograph and CT earlier today FINDINGS: Tip and side port of the enteric tube below the diaphragm in the stomach. Gaseous bowel distension persists throughout the abdomen. IMPRESSION: Tip and side port of the enteric tube below the diaphragm in the stomach. Electronically Signed   By: Narda Rutherford M.D.   On: 07/12/2023 17:06    Assessment: Heidi [name] is a 52 year old female  with history of HTN, alcohol abuse, who presented to the ED 7/15 via EMS due to generalized bodyaches, n/v which she thought was secondary to alcohol withdrawal.  found to be mildly febrile at 100.2, also with hypokalemia, mildly elevated LFTs and bilirubin.  RUQ ultrasound with hepatic steatosis.  She was admitted on CIWA protocol.  Ultimately developed severe alcohol withdrawal, DTs, and started on Precedex on  7/18.  Vomiting/dilated small bowel/colon: developed projectile vomiting x2 on evening of 7/21, distention of abdomen as well. Significantly dilated bowel loops on abd xray. CT A/P wo contrast showed dilated small bowel and colon with sigmoid colon/rectum decompressed. Moderate low density ascites with soft tissue stranding throughout  mesenteric fat/possible peritoneal nodularity in right mid abdomen. No evidence of perforation. Peritoneal carcinomatosis not excluded.   Had from NG tube Monday, some stool output noted in flexiseal yesterday. KUB repeated yesterday with persistent gaseous distention of small/large bowel loops, bowel gas distention minimally changed from previous imaging. She underwent Flex sig yesterday with Erythematous mucosa in the transverse colon. No changes concerning for necrosis, Dilation in the descending/transverse  colon. Fluid aspiration performed. Stool in the rectum,sigmoid descending and transverse colon.   Repeat KUB after flex sig with stable small bowel dilatation centrally. NG was removed by patient early this morning. Surgery saw patient this morning, concerned for ileus, repeat KUB pending, they recommended replacing NG for further decompression and she is not passing much stool/flatus currently. Bowel sounds remain very minimal, abdomen distended upon exam.    Plan: Await repeat KUB Minimize opiate use Keep K+ >4, Mg >2 Mobilize patient from side to side every 1-2 hours.  Appreciate Gen surg input   LOS: 9 days    07/14/2023, 9:45 AM   Heidi L. Jeanmarie Hubert, MSN, APRN, AGNP-C Adult-Gerontology Nurse Practitioner [facility] Gastroenterology at [facility]   Attending note:   Patient appears stable this afternoon.    Abdominal distention persists.  KUB reveals stable distention of small bowel and colon.  Rectal tube in place.  NG tube replaced by nursing staff earlier today. Overall, not much improvement with flex sig yesterday. Agree with conservative  management including normalizing electrolytes and tube decompression from above and below.  No obstruction based on imaging and flex sig. Patient appears to have an acute ileus/Ogilvie's type syndrome in the setting of acute medical illness.  If no improvement in the next 24 hours, would consider neostigmine to decompress her bowel.

## 2023-07-14 NOTE — Progress Notes (Signed)
PROGRESS NOTE  EVOLEHT HOVATTER ONG:[id number] DOB: 03/31/1971 DOA: 07/05/2023 PCP: Rica Records, FNP  Brief History:   52 y.o. female with medical history significant of hypertension, alcohol abuse, tobacco use disorder who presents to the emergency department from home via EMS due to complaints of generalized bodyaches, nausea, vomiting which she thinks was related to alcohol withdrawal. Patient usually drinks 4 40 ounce beers daily, but she only had 2 12 ounce beers today.  Patient complained of pain in chest, legs, arms and endorsed having had some falls without any injury.  Her hospitalization has been prolonged secondary to delirium and agitation presumably from alcohol withdrawal.  She has been on Precedex since 07/07/2023.  Attempts at weaning had resulted in increased agitation.  Unfortunately, her hospitalization has been prolonged secondary to development of ileus.  NG was inserted to low intermittent suction on 07/14/2023.  PICC line was requested on 07/14/2023 with plans for TPN.   Assessment/Plan: Acute metabolic encephalopathy/delirium tremens -Patient has been on Precedex and 07/07/2023 -Attempts at weaning has resulted in increased agitation -Unfortunately, the patient's ileus has limited oral medications to help mitigate her agitation -Start clonidine TTS 1 patch and titrate as needed -Continue IV Precedex for now -Librium was discontinued -Continue high-dose thiamine another 24 hours -Repeat UA -B12 -Ammonia -Folic acid -TSH -UDS positive for benzodiazepine  Abdominal ileus -07/12/2023 CT AP--multiple dilated fluid-filled loops of SB and colon -Appreciate GI -07/13/2023 flex sig--no obstruction noted--erythematous mucosa in the transverse colon with dilated in the descending, transverse: -07/14/2023--NG inserted to low intermittent suction -Appreciate general surgery consult and assistance -Remain n.p.o.  FEN -Replace potassium -Place PICC line for  TPN initiation -Continue D5 normal saline for now  Essential hypertension -Irbesartan and diltiazem were initially discontinued secondary to low blood pressure -Patient is now hypertensive again  Thrombocytopenia -Secondary to myelosuppression from alcohol use  Transaminasemia -due to Etoh -improved  Tobacco cessation -NicoDerm patch -Cessation discussed  Hypokalemia -Replete -Check magnesium         Family Communication:  no Family at bedside  Consultants:  PCCM  Code Status:  FULL   DVT Prophylaxis:  SCDs   Procedures: As Listed in Progress Note Above  Antibiotics: None      Subjective: Patient is confused.  Review of systems not obtainable.  Objective: Vitals:   07/14/23 1000 07/14/23 1100 07/14/23 1113 07/14/23 1300  BP:  (!) 168/99  (!) 161/105  Pulse: 74 73  73  Resp: (!) 9 18  (!) 22  Temp:   97.6 F (36.4 C)   TempSrc:   Axillary   SpO2: 98% 100%  100%  Weight:      Height:        Intake/Output Summary (Last 24 hours) at 07/14/2023 1428 Last data filed at 07/13/2023 1842 Gross per 24 hour  Intake 888.93 ml  Output 75 ml  Net 813.93 ml   Weight change: -0.7 kg Exam:  General:  Pt is confused.  Does not follow commands. HEENT: No icterus, No thrush, No neck mass, [place]/AT Cardiovascular: RRR, S1/S2, no rubs, no gallops Respiratory: bibasilar crackles. No wheeze Abdomen: Soft/+BS, non tender, mildly distended, no guarding Extremities: No edema, No lymphangitis, No petechiae, No rashes, no synovitis   Data Reviewed: I have personally reviewed following labs and imaging studies Basic Metabolic Panel: Recent Labs  Lab 07/10/23 0447 07/11/23 0426 07/12/23 0327 07/13/23 0532 07/14/23 0456  NA 142 136 133* 138 142  K 4.0 3.1* 3.1* 3.4* 3.4*  CL 116* 111 107 107 112*  CO2 20* 19* 19* 21* 22  GLUCOSE 109* 115* 152* 127* 136*  BUN 7 <5* 6 9 13   CREATININE 0.52 0.45 0.51 0.59 0.47  CALCIUM 9.6 9.2 8.9 9.7 9.1  MG  --  1.7  --   1.9  --    Liver Function Tests: Recent Labs  Lab 07/09/23 0757 07/10/23 0447 07/11/23 0426 07/14/23 0456  AST 42* 34 22 21  ALT 40 32 27 19  ALKPHOS 67 65 63 53  BILITOT 1.2 0.9 0.9 0.8  PROT 7.1 6.8 6.7 6.0*  ALBUMIN 3.7 3.4* 3.2* 2.8*   Recent Labs  Lab 07/12/23 0327  LIPASE 41   No results for input(s): "AMMONIA" in the last 168 hours. Coagulation Profile: No results for input(s): "INR", "PROTIME" in the last 168 hours. CBC: Recent Labs  Lab 07/09/23 0757 07/10/23 0447 07/11/23 0426 07/12/23 0327 07/13/23 0532  WBC 6.3 4.4 4.3 2.2* 7.0  HGB 12.0 10.7* 11.0* 12.7 12.4  HCT 36.0 33.4* 33.4* 39.0 37.9  MCV 92.8 94.1 92.3 92.9 92.4  PLT 68* 56* 105* 112* 158   Cardiac Enzymes: No results for input(s): "CKTOTAL", "CKMB", "CKMBINDEX", "TROPONINI" in the last 168 hours. BNP: Invalid input(s): "POCBNP" CBG: Recent Labs  Lab 07/08/23 2114 07/10/23 2116 07/11/23 0513  GLUCAP 170* 92 126*   HbA1C: No results for input(s): "HGBA1C" in the last 72 hours. Urine analysis:    Component Value Date/Time   COLORURINE AMBER (A) 07/05/2023 2145   APPEARANCEUR HAZY (A) 07/05/2023 2145   LABSPEC 1.027 07/05/2023 2145   PHURINE 5.0 07/05/2023 2145   GLUCOSEU NEGATIVE 07/05/2023 2145   HGBUR LARGE (A) 07/05/2023 2145   BILIRUBINUR NEGATIVE 07/05/2023 2145   KETONESUR 20 (A) 07/05/2023 2145   PROTEINUR 100 (A) 07/05/2023 2145   UROBILINOGEN 0.2 01/04/2012 1330   NITRITE NEGATIVE 07/05/2023 2145   LEUKOCYTESUR NEGATIVE 07/05/2023 2145   Sepsis Labs: @LABRCNTIP (procalcitonin:4,lacticidven:4) ) Recent Results (from the past 240 hour(s))  MRSA Next Gen by PCR, Nasal     Status: None   Collection Time: 07/05/23 10:10 PM   Specimen: Nasal Mucosa; Nasal Swab  Result Value Ref Range Status   MRSA by PCR Next Gen NOT DETECTED NOT DETECTED Final    Comment: (NOTE) The GeneXpert MRSA Assay (FDA approved for NASAL specimens only), is one component of a comprehensive MRSA  colonization surveillance program. It is not intended to diagnose MRSA infection nor to guide or monitor treatment for MRSA infections. Test performance is not FDA approved in patients less than 2 years old. Performed at [facility], [address], [place], [place] [address]   Culture, blood (Routine X 2) w Reflex to ID Panel     Status: None   Collection Time: 07/05/23 11:45 PM   Specimen: BLOOD LEFT ARM  Result Value Ref Range Status   Specimen Description BLOOD LEFT ARM  Final   Special Requests   Final    BOTTLES DRAWN AEROBIC AND ANAEROBIC Blood Culture adequate volume   Culture   Final    NO GROWTH 5 DAYS Performed at [facility], [address] [place], [place] [address]    Report Status 07/11/2023 FINAL  Final  Culture, blood (Routine X 2) w Reflex to ID Panel     Status: None   Collection Time: 07/05/23 11:48 PM   Specimen: BLOOD LEFT HAND  Result Value Ref Range Status   Specimen Description BLOOD  LEFT HAND  Final   Special Requests   Final    BOTTLES DRAWN AEROBIC AND ANAEROBIC Blood Culture adequate volume   Culture   Final    NO GROWTH 5 DAYS Performed at [facility], [address] [place], [place] [address]    Report Status 07/11/2023 FINAL  Final  Culture, blood (Routine X 2) w Reflex to ID Panel     Status: None (Preliminary result)   Collection Time: 07/09/23  9:09 AM   Specimen: BLOOD  Result Value Ref Range Status   Specimen Description BLOOD LEFT ASSIST CONTROL  Final   Special Requests   Final    BOTTLES DRAWN AEROBIC AND ANAEROBIC Blood Culture adequate volume   Culture   Final    NO GROWTH 4 DAYS Performed at [facility], [address] [place], [place] [address]    Report Status PENDING  Incomplete  Culture, blood (Routine X 2) w Reflex to ID Panel     Status: None (Preliminary result)   Collection Time: 07/09/23  9:09 AM   Specimen: BLOOD  Result Value Ref Range Status   Specimen Description BLOOD LEFT HAND  Final   Special Requests    Final    BOTTLES DRAWN AEROBIC AND ANAEROBIC Blood Culture adequate volume   Culture   Final    NO GROWTH 4 DAYS Performed at [facility], [address], [place], [place] [address]    Report Status PENDING  Incomplete     Scheduled Meds:  Chlorhexidine Gluconate Cloth  6 each Topical Daily   cloNIDine  0.1 mg Transdermal Weekly   diltiazem  120 mg Oral Daily   folic acid  1 mg Oral Daily   metoprolol tartrate  5 mg Intravenous Q8H   multivitamin with minerals  1 tablet Oral Daily   nicotine  14 mg Transdermal Daily   polyethylene glycol  17 g Oral Daily   senna-docusate  1 tablet Oral BID   Continuous Infusions:  dexmedetomidine (PRECEDEX) IV infusion 0.9 mcg/kg/hr (07/14/23 1316)   dextrose 5 % and 0.9 % NaCl with KCl 20 mEq/L 75 mL/hr at 07/14/23 0253   thiamine (VITAMIN B1) injection      Procedures/Studies: [place] EKG SITE RITE  Result Date: 07/14/2023 If Site Rite image not attached, placement could not be confirmed due to current cardiac rhythm.  DG Abd Portable 1V  Result Date: 07/14/2023 CLINICAL DATA:  NG tube placement EXAM: PORTABLE ABDOMEN - 1 VIEW COMPARISON:  Previous studies including the examination none earlier today FINDINGS: There is interval placement of NG tube with its tip and side port within the stomach. Dilation of small-bowel loops in central abdomen has not changed. Gas is present in ascending and transverse colon. IMPRESSION: Tip of enteric tube is seen in the stomach. No change is seen in small bowel dilation. Electronically Signed   By: Ernie Avena M.D.   On: 07/14/2023 13:11   DG Abd 1 View  Result Date: 07/14/2023 CLINICAL DATA:  Abdominal distention EXAM: ABDOMEN - 1 VIEW COMPARISON:  Previous studies including the examination of 07/13/2023 FINDINGS: There is dilation of small-bowel loops in upper and mid abdomen. Gas is present in transverse colon. There is no demonstrable gas in rectosigmoid. No significant changes are noted in small bowel  dilation. IMPRESSION: There is dilation of small bowel loops suggesting possible ileus or partial obstruction. No significant changes are noted. Electronically Signed   By: Ernie Avena M.D.   On: 07/14/2023 13:09  DG Abd 1 View  Result Date: 07/13/2023 CLINICAL DATA:  Follow-up ileus, initial encounter EXAM: ABDOMEN - 1 VIEW COMPARISON:  None Available. FINDINGS: Scattered large and small bowel gas is noted. Multiple dilated loops of small bowel are again noted similar to that seen on the prior exam. No free air is seen. No mass lesion is noted. No bony abnormality is seen. IMPRESSION: Stable small bowel dilatation centrally. Electronically Signed   By: Alcide Clever M.D.   On: 07/13/2023 20:09   DG Abd 1 View  Result Date: 07/13/2023 CLINICAL DATA:  Abdominal distension. EXAM: ABDOMEN - 1 VIEW COMPARISON:  07/12/2023 FINDINGS: Persistent gaseous distension of bowel loops throughout the abdomen and pelvis. The degree of bowel distension has minimally changed. Upper abdomen is incompletely imaged and not clear if nasogastric tube is still present. Cannot evaluate for free air on the supine image. IMPRESSION: Persistent gaseous distension of small and large bowel loops. Based on the previous CT findings, a distal colonic obstruction cannot be excluded. Bowel gas distension has minimally changed. Electronically Signed   By: Richarda Overlie M.D.   On: 07/13/2023 16:33   DG Abd 1 View  Result Date: 07/12/2023 CLINICAL DATA:  Nasogastric tube placement. EXAM: ABDOMEN - 1 VIEW COMPARISON:  Radiograph and CT earlier today FINDINGS: Tip and side port of the enteric tube below the diaphragm in the stomach. Gaseous bowel distension persists throughout the abdomen. IMPRESSION: Tip and side port of the enteric tube below the diaphragm in the stomach. Electronically Signed   By: Narda Rutherford M.D.   On: 07/12/2023 17:06   CT ABDOMEN PELVIS WO CONTRAST  Result Date: 07/12/2023 CLINICAL DATA:  Abdominal pain,  acute. EXAM: CT ABDOMEN AND PELVIS WITHOUT CONTRAST TECHNIQUE: Multidetector CT imaging of the abdomen and pelvis was performed following the standard protocol without IV contrast. RADIATION DOSE REDUCTION: This exam was performed according to the departmental dose-optimization program which includes automated exposure control, adjustment of the mA and/or kV according to patient size and/or use of iterative reconstruction technique. COMPARISON:  Abdominal radiographs 07/12/2023. Abdominal ultrasound 07/06/2023. FINDINGS: Lower chest: Trace bilateral pleural effusions with associated dependent atelectasis at both lung bases. No consolidation or significant pericardial fluid. There is moderate distal esophageal wall thickening which is nonspecific. Hepatobiliary: No suspicious hepatic findings are identified on noncontrast imaging. In the dome of the left hepatic lobe, there is a 9 mm low-density lesion which is likely a cyst. No evidence of gallstones, gallbladder wall thickening or biliary dilatation. Pancreas: Unremarkable. No pancreatic ductal dilatation or surrounding inflammatory changes. Spleen: Normal in size without focal abnormality. Adrenals/Urinary Tract: Both adrenal glands appear normal. No evidence of urinary tract calculus, suspicious renal lesion or hydronephrosis. Mild nonspecific perinephric soft tissue stranding bilaterally. The bladder appears unremarkable for its degree of distention. Stomach/Bowel: There may be a minimal amount of enteric contrast dependently in the proximal stomach. The stomach and proximal small bowel are not significantly distended. However, as seen on earlier radiographs, there are multiple dilated and fluid-filled loops of mid to distal small bowel and colon. The colon is dilated into the mid descending colon. The sigmoid colon and rectum are relatively decompressed, although did no focal transition point or obvious colonic mass is identified on this noncontrast study.  There is no bowel wall thickening or pneumatosis. There is some high density material within the lumen of the appendix, and the appendix appears mildly distended, although demonstrates no wall thickening or focal surrounding inflammation. A rectal tube is in  place. Vascular/Lymphatic: There are no enlarged abdominal or pelvic lymph nodes. Aortic and branch vessel atherosclerosis without evidence of aneurysm. Vascular patency not addressed without contrast. Reproductive: The uterus and ovaries appear normal. No adnexal mass. Other: Moderate amount of low-density ascites. There is soft tissue stranding throughout the mesenteric fat. Possible peritoneal nodularity in the right mid abdomen measuring up to 2.3 x 1.2 cm on image 44/2. No pneumoperitoneum or abdominal wall hernia identified. Musculoskeletal: No acute or significant osseous findings. Unless specific follow-up recommendations are mentioned in the findings or impression sections, no imaging follow-up of any mentioned incidental findings is recommended. IMPRESSION: 1. Multiple dilated and fluid-filled loops of mid to distal small bowel and colon with relative decompression of the sigmoid colon and rectum. No focal transition point or obvious colonic mass is identified on this study which is limited by the lack of intravenous and enteric contrast. Findings could be secondary to an ileus from underlying peritonitis, although are concerning for possible distal colonic obstruction. Recommend further evaluation with colonoscopy. 2. Moderate amount of low-density ascites with soft tissue stranding throughout the mesenteric fat and possible peritoneal nodularity in the right mid abdomen. No evidence of bowel perforation. Cannot exclude peritoneal carcinomatosis. 3. Mildly prominent appendix without evidence of acute inflammation. 4. Trace bilateral pleural effusions with associated dependent atelectasis at both lung bases. 5. Distal esophageal wall thickening,  nonspecific, but potentially related to esophagitis. 6.  Aortic Atherosclerosis (ICD10-I70.0). Electronically Signed   By: Carey Bullocks M.D.   On: 07/12/2023 13:24   DG Abd 1 View  Result Date: 07/12/2023 CLINICAL DATA:  Abdominal pain EXAM: ABDOMEN - 1 VIEW COMPARISON:  None Available. FINDINGS: Significantly dilated bowel loops. No definite free air on this limited supine radiograph. No abnormal calcifications identified although bowel loops limited assessment. Lower lumbar degenerative change. IMPRESSION: Significantly dilated bowel loops which could represent obstruction or ileus. Consider CT of the abdomen/pelvis for further evaluation. Electronically Signed   By: Feliberto Harts M.D.   On: 07/12/2023 08:31   DG CHEST PORT 1 VIEW  Result Date: 07/09/2023 CLINICAL DATA:  Cough, alcohol withdrawal. EXAM: PORTABLE CHEST 1 VIEW COMPARISON:  Chest radiograph 04/08/2023 FINDINGS: The cardiomediastinal silhouette is normal There is no focal consolidation or pulmonary edema. There is no pleural effusion or pneumothorax There is no acute osseous abnormality. IMPRESSION: No radiographic evidence of acute cardiopulmonary process. Electronically Signed   By: Lesia Hausen M.D.   On: 07/09/2023 12:12   US Abdomen Limited RUQ (LIVER/GB)  Result Date: 07/06/2023 CLINICAL DATA:  Transaminitis EXAM: ULTRASOUND ABDOMEN LIMITED RIGHT UPPER QUADRANT COMPARISON:  None Available. FINDINGS: Gallbladder: No gallstones or wall thickening visualized. No sonographic Murphy sign noted by sonographer. Common bile duct: Diameter: 0.3 cm Liver: No focal lesion identified. Increased parenchymal echogenicity. Portal vein is patent on color Doppler imaging with normal direction of blood flow towards the liver. Other: None. IMPRESSION: 1. Hepatic steatosis. 2. Otherwise unremarkable right upper quadrant ultrasound. Electronically Signed   By: Jearld Lesch M.D.   On: 07/06/2023 14:47    Catarina Hartshorn, DO  [facility]  If  7PM-7AM, please contact night-coverage [email or web address] Password [email or web address] 07/14/2023, 2:28 PM   LOS: 9 days

## 2023-07-14 NOTE — Progress Notes (Signed)
NAME:  Heidi [name], MRN:  [id number], DOB:  [birth year], LOS: 9 ADMISSION DATE:  07/05/2023, CONSULTATION DATE:  07/14/23 REFERRING MD:  TRH, CHIEF COMPLAINT:  Encephalopathy   History of Present Illness:  52 year old woman with reported history of alcohol abuse presented to the ED via EMS with bodyaches nausea vomiting felt related to alcohol withdrawal in the setting of significant decrease in alcohol consumption that day.  Started on CIWA protocol and admitted.  Had worsening agitation escalated to the ICU 7/17.  Started on Precedex drip.  Seems like this helped initially.  Unfortunate developed worsening nausea vomiting abdominal distention.  Precedex was discontinued 7/22.  It appears to be started later in the day, few hours later with worsening agitation.  Escalating doses over time some worsening agitation overnight in the morning.  Back to maximum dose afternoon 7/23.  CT abdomen pelvis 7/22 showed concern for ileus.  Worsening abdominal exam and abdominal film 7/23.  GI plan for EGD this afternoon for further evaluation.  Pertinent  Medical History    Significant Hospital Events: Including procedures, antibiotic start and stop dates in addition to other pertinent events   7/15 admitted to the hospital with concern for alcohol withdrawal 7/17 escalated care to ICU and Precedex started for worsening agitation 7/23 PCCM consult for agitation and encephalopathy, plan EGD with worsening ileus  Interim History / Subjective:  No fevers or night, underwent sigmoidoscopy.  No significant findings I can tell.  Appears better this morning, was interactive with provider while I was on the camera.  Discussed attempt to wean Precedex.  Objective   Blood pressure (!) 168/102, pulse 73, temperature 98.3 F (36.8 C), temperature source Axillary, resp. rate 18, height 5\' 3"  (1.6 m), weight 67.2 kg, SpO2 100%.        Intake/Output Summary (Last 24 hours) at 07/14/2023 1608 Last data filed at  07/13/2023 1842 Gross per 24 hour  Intake 94.07 ml  Output 75 ml  Net 19.07 ml   Filed Weights   07/12/23 0422 07/13/23 0500 07/14/23 0500  Weight: 67.6 kg 67.9 kg 67.2 kg    Examination: General: Lying in hospital bed, appears to be conversing with a provider HENT: Atraumatic, normocephalic Lungs: Respirations even unlabored, on supplemental oxygen Cardiovascular: Sinus rhythm on the monitor, no significant edema noted via camera Abdomen: NG in place Neuro: Downside, communicative   Resolved Hospital Problem list     Assessment & Plan:  Encephalopathy: Present now for some days.  Admitted for about 8 days.  Initial concern for alcohol withdrawal.  However now 1 week plus out this seems less likely.  Reported bouts of agitation with decrease in Precedex.  Precedex target 7/17.  High suspicion for element of withdrawal from Precedex.  Also possible underlying behavioral psychiatric disease, delirium as well. --continue CIWA protocol -- Continue Precedex for now with attempts at decreasing -- Clonidine patch to aid with withdrawal symptoms of Precedex which is favored to be the etiology of more recent encephalopathy, clonidine as needed also added  Alcohol withdrawal: Likely out of the window now 8 days admitted.  Is quite possible just a bit longer and starting to turn the corner given decrease in Ativan use throughout the day 7/23. -- Continue CIWA as above  Best Practice (right click and "Reselect all SmartList Selections" daily)   Per primary  Labs   CBC: Recent Labs  Lab 07/09/23 0757 07/10/23 0447 07/11/23 0426 07/12/23 0327 07/13/23 0532  WBC 6.3 4.4 4.3 2.2* 7.0  HGB 12.0 10.7* 11.0* 12.7 12.4  HCT 36.0 33.4* 33.4* 39.0 37.9  MCV 92.8 94.1 92.3 92.9 92.4  PLT 68* 56* 105* 112* 158    Basic Metabolic Panel: Recent Labs  Lab 07/10/23 0447 07/11/23 0426 07/12/23 0327 07/13/23 0532 07/14/23 0456  NA 142 136 133* 138 142  K 4.0 3.1* 3.1* 3.4* 3.4*  CL  116* 111 107 107 112*  CO2 20* 19* 19* 21* 22  GLUCOSE 109* 115* 152* 127* 136*  BUN 7 <5* 6 9 13   CREATININE 0.52 0.45 0.51 0.59 0.47  CALCIUM 9.6 9.2 8.9 9.7 9.1  MG  --  1.7  --  1.9  --    GFR: Estimated Creatinine Clearance: 78.3 mL/min (by C-G formula based on SCr of 0.47 mg/dL). Recent Labs  Lab 07/09/23 0909 07/10/23 0447 07/11/23 0426 07/12/23 0327 07/12/23 1610 07/13/23 0532 07/13/23 1302  PROCALCITON 0.94  --   --   --   --   --   --   WBC  --  4.4 4.3 2.2*  --  7.0  --   LATICACIDVEN 0.8  --   --   --  1.6  --  1.6    Liver Function Tests: Recent Labs  Lab 07/09/23 0757 07/10/23 0447 07/11/23 0426 07/14/23 0456  AST 42* 34 22 21  ALT 40 32 27 19  ALKPHOS 67 65 63 53  BILITOT 1.2 0.9 0.9 0.8  PROT 7.1 6.8 6.7 6.0*  ALBUMIN 3.7 3.4* 3.2* 2.8*   Recent Labs  Lab 07/12/23 0327  LIPASE 41   No results for input(s): "AMMONIA" in the last 168 hours.  ABG No results found for: "PHART", "PCO2ART", "PO2ART", "HCO3", "TCO2", "ACIDBASEDEF", "O2SAT"   Coagulation Profile: No results for input(s): "INR", "PROTIME" in the last 168 hours.  Cardiac Enzymes: No results for input(s): "CKTOTAL", "CKMB", "CKMBINDEX", "TROPONINI" in the last 168 hours.  HbA1C: No results found for: "HGBA1C"  CBG: Recent Labs  Lab 07/08/23 2114 07/10/23 2116 07/11/23 0513  GLUCAP 170* 92 126*    Review of Systems:   Unable to obtain due to encephalopathy  Past Medical History:  She,  has a past medical history of Alcohol withdrawal (HCC), Cervical radiculopathy, and Malignant hyperthermia.   Surgical History:  History reviewed. No pertinent surgical history.   Social History:   reports that she has been smoking cigarettes. She has a 10 pack-year smoking history. She has never used smokeless tobacco. She reports current alcohol use of about 42.0 standard drinks of alcohol per week. She reports that she does not use drugs.   Family History:  Her family history  includes Stroke in her father.   Allergies No Known Allergies   Home Medications  Prior to Admission medications   Medication Sig Start Date End Date Taking? Authorizing Provider  cyclobenzaprine (FLEXERIL) 5 MG tablet TAKE 1 TABLET BY MOUTH (3) TIMES A DAY AS NEEDED FOR MUSCLE SPASMS. Patient taking differently: Take 5 mg by mouth 3 (three) times daily as needed for muscle spasms. 06/27/23  Yes Del Newman Nip, Tenna Child, FNP  naproxen (NAPROSYN) 500 MG tablet Take 1 tablet (500 mg total) by mouth 2 (two) times daily with a meal. 06/04/23  Yes Vickki Hearing, MD  olmesartan (BENICAR) 20 MG tablet Take 0.5 tablets (10 mg total) by mouth daily. 05/27/23  Yes Del Newman Nip, Tenna Child, FNP  ondansetron (ZOFRAN) 4 MG tablet TAKE 1 TABLET BY MOUTH EVERY 6 HOURS 06/27/23  Yes Del Avnet,  Tenna Child, FNP  chlordiazePOXIDE (LIBRIUM) 5 MG capsule Take 2 tablet by mouth 3 times a day x 2 days; then 1 tablet by mouth 3 times a day x 2 days; then 1 tablet by mouth twice a day x 2 days; then 1 tablet by mouth daily x 3 days and stop Librium. Patient not taking: Reported on 07/10/2023 04/15/23   Vassie Loll, MD  diltiazem (CARDIZEM CD) 120 MG 24 hr capsule Take 1 capsule (120 mg total) by mouth daily. Patient not taking: Reported on 07/10/2023 04/16/23   Vassie Loll, MD  naltrexone (DEPADE) 50 MG tablet Take 1 tablet (50 mg total) by mouth daily. Patient not taking: Reported on 07/10/2023 05/18/23   Del Nigel Berthold, FNP  pantoprazole (PROTONIX) 40 MG tablet Take 1 tablet (40 mg total) by mouth daily. Patient not taking: Reported on 06/04/2023 04/15/23 04/14/24  Vassie Loll, MD  QUEtiapine (SEROQUEL) 25 MG tablet Take 1 tablet (25 mg total) by mouth at bedtime. Patient not taking: Reported on 07/10/2023 04/15/23   Vassie Loll, MD     Critical care time:     CRITICAL CARE Performed by: Karren Burly   Total critical care time: 30 minutes  Critical care time was exclusive of separately  billable procedures and treating other patients.  Critical care was necessary to treat or prevent imminent or life-threatening deterioration.  Critical care was time spent personally by me on the following activities: development of treatment plan with patient and/or surrogate as well as nursing, discussions with consultants, evaluation of patient's response to treatment, examination of patient, obtaining history from patient or surrogate, ordering and performing treatments and interventions, ordering and review of laboratory studies, ordering and review of radiographic studies, pulse oximetry and re-evaluation of patient's condition.  Karren Burly, MD See Loretha Stapler

## 2023-07-14 NOTE — Progress Notes (Signed)
Patient pulled out NG tube. Aggressive and combative with staff, refusing to have NG replaced

## 2023-07-15 ENCOUNTER — Inpatient Hospital Stay (HOSPITAL_COMMUNITY): Payer: Medicaid Other

## 2023-07-15 DIAGNOSIS — F17200 Nicotine dependence, unspecified, uncomplicated: Secondary | ICD-10-CM | POA: Diagnosis not present

## 2023-07-15 DIAGNOSIS — F10939 Alcohol use, unspecified with withdrawal, unspecified: Secondary | ICD-10-CM | POA: Diagnosis not present

## 2023-07-15 DIAGNOSIS — R112 Nausea with vomiting, unspecified: Secondary | ICD-10-CM | POA: Diagnosis not present

## 2023-07-15 DIAGNOSIS — I10 Essential (primary) hypertension: Secondary | ICD-10-CM | POA: Diagnosis not present

## 2023-07-15 DIAGNOSIS — D696 Thrombocytopenia, unspecified: Secondary | ICD-10-CM | POA: Diagnosis not present

## 2023-07-15 DIAGNOSIS — K567 Ileus, unspecified: Secondary | ICD-10-CM | POA: Diagnosis not present

## 2023-07-15 LAB — CBC
Hemoglobin: 10.7 g/dL — ABNORMAL LOW (ref 12.0–15.0)
MCH: 29.8 pg (ref 26.0–34.0)
MCHC: 32 g/dL (ref 30.0–36.0)
MCV: 93 fL (ref 80.0–100.0)
Platelets: 215 10*3/uL (ref 150–400)
RBC: 3.59 MIL/uL — ABNORMAL LOW (ref 3.87–5.11)
RDW: 14.8 % (ref 11.5–15.5)
WBC: 15.8 10*3/uL — ABNORMAL HIGH (ref 4.0–10.5)
nRBC: 0.5 % — ABNORMAL HIGH (ref 0.0–0.2)

## 2023-07-15 LAB — PHOSPHORUS: Phosphorus: 2.9 mg/dL (ref 2.5–4.6)

## 2023-07-15 LAB — CULTURE, BLOOD (ROUTINE X 2)
Culture: NO GROWTH
Special Requests: ADEQUATE
Special Requests: ADEQUATE
Special Requests: ADEQUATE

## 2023-07-15 LAB — GLUCOSE, CAPILLARY
Glucose-Capillary: 100 mg/dL — ABNORMAL HIGH (ref 70–99)
Glucose-Capillary: 109 mg/dL — ABNORMAL HIGH (ref 70–99)
Glucose-Capillary: 147 mg/dL — ABNORMAL HIGH (ref 70–99)
Glucose-Capillary: 79 mg/dL (ref 70–99)
Glucose-Capillary: 80 mg/dL (ref 70–99)
Glucose-Capillary: 93 mg/dL (ref 70–99)

## 2023-07-15 LAB — COMPREHENSIVE METABOLIC PANEL
ALT: 19 U/L (ref 0–44)
Albumin: 2.9 g/dL — ABNORMAL LOW (ref 3.5–5.0)
Alkaline Phosphatase: 75 U/L (ref 38–126)
Anion gap: 10 (ref 5–15)
BUN: 14 mg/dL (ref 6–20)
CO2: 20 mmol/L — ABNORMAL LOW (ref 22–32)
Calcium: 9.1 mg/dL (ref 8.9–10.3)
Chloride: 115 mmol/L — ABNORMAL HIGH (ref 98–111)
Creatinine, Ser: 0.74 mg/dL (ref 0.44–1.00)
GFR, Estimated: >60 mL/min (ref >60)
Glucose, Bld: 85 mg/dL (ref 70–99)
Potassium: 3.4 mmol/L — ABNORMAL LOW (ref 3.5–5.1)
Sodium: 145 mmol/L (ref 135–145)
Total Protein: 6.4 g/dL — ABNORMAL LOW (ref 6.5–8.1)

## 2023-07-15 LAB — LACTIC ACID, PLASMA: Lactic Acid, Venous: 0.8 mmol/L (ref 0.5–1.9)

## 2023-07-15 LAB — MAGNESIUM: Magnesium: 2 mg/dL (ref 1.7–2.4)

## 2023-07-15 LAB — TSH: TSH: 2.866 u[IU]/mL (ref 0.350–4.500)

## 2023-07-15 LAB — VITAMIN B12: Vitamin B-12: 1649 pg/mL — ABNORMAL HIGH (ref 180–914)

## 2023-07-15 LAB — AMMONIA: Ammonia: 25 umol/L (ref 9–35)

## 2023-07-15 LAB — T4, FREE: Free T4: 1.05 ng/dL (ref 0.61–1.12)

## 2023-07-15 LAB — PROCALCITONIN: Procalcitonin: 0.11 ng/mL

## 2023-07-15 LAB — FOLATE: Folate: 8.7 ng/mL (ref >5.9)

## 2023-07-15 MED ORDER — TRAMADOL HCL 50 MG PO TABS
50.0000 mg | ORAL_TABLET | Freq: Four times a day (QID) | ORAL | Status: DC | PRN
  Administered 2023-07-17 (×2): 50 mg
  Filled 2023-07-15 (×2): qty 1

## 2023-07-15 MED ORDER — SODIUM CHLORIDE 0.9% FLUSH: 10.0000 mL | INTRAVENOUS | Status: DC | PRN

## 2023-07-15 MED ORDER — LORAZEPAM 2 MG/ML IJ SOLN
1.0000 mg | INTRAMUSCULAR | Status: DC | PRN
  Administered 2023-07-15 – 2023-07-17 (×13): 2 mg via INTRAVENOUS
  Administered 2023-07-17: 1 mg via INTRAVENOUS
  Administered 2023-07-17 – 2023-07-18 (×5): 2 mg via INTRAVENOUS
  Filled 2023-07-15 (×20): qty 1

## 2023-07-15 MED ORDER — KCL IN DEXTROSE-NACL 20-5-0.45 MEQ/L-%-% IV SOLN: INTRAVENOUS | Status: AC

## 2023-07-15 MED ORDER — ACETAMINOPHEN 650 MG RE SUPP: 650.0000 mg | Freq: Four times a day (QID) | RECTAL | Status: DC | PRN

## 2023-07-15 MED ORDER — OXYCODONE HCL 5 MG PO TABS
5.0000 mg | ORAL_TABLET | Freq: Four times a day (QID) | ORAL | Status: DC | PRN
  Administered 2023-07-16 – 2023-07-18 (×4): 5 mg
  Filled 2023-07-15 (×4): qty 1

## 2023-07-15 MED ORDER — SENNOSIDES-DOCUSATE SODIUM 8.6-50 MG PO TABS
1.0000 | ORAL_TABLET | Freq: Two times a day (BID) | ORAL | Status: DC
  Administered 2023-07-15 – 2023-07-18 (×6): 1
  Filled 2023-07-15 (×6): qty 1

## 2023-07-15 MED ORDER — FOLIC ACID 5 MG/ML IJ SOLN
1.0000 mg | Freq: Every day | INTRAMUSCULAR | Status: DC
  Administered 2023-07-15 – 2023-07-20 (×5): 1 mg via INTRAVENOUS
  Filled 2023-07-15 (×3): qty 0.2

## 2023-07-15 MED ORDER — KCL IN DEXTROSE-NACL 20-5-0.9 MEQ/L-%-% IV SOLN: INTRAVENOUS | Status: DC

## 2023-07-15 MED ORDER — TRAVASOL 10 % IV SOLN
INTRAVENOUS | Status: AC
  Filled 2023-07-15: qty 480

## 2023-07-15 MED ORDER — POTASSIUM CHLORIDE 10 MEQ/100ML IV SOLN
10.0000 meq | INTRAVENOUS | Status: AC
  Administered 2023-07-15 (×5): 10 meq via INTRAVENOUS
  Filled 2023-07-15 (×5): qty 100

## 2023-07-15 MED ORDER — ACETAMINOPHEN 325 MG PO TABS
650.0000 mg | ORAL_TABLET | Freq: Four times a day (QID) | ORAL | Status: DC | PRN
  Administered 2023-07-16 – 2023-07-18 (×4): 650 mg
  Filled 2023-07-15 (×4): qty 2

## 2023-07-15 MED ORDER — DEXTROSE 50 % IV SOLN
25.0000 mL | Freq: Once | INTRAVENOUS | Status: AC
  Administered 2023-07-15: 25 mL via INTRAVENOUS

## 2023-07-15 MED ORDER — SODIUM CHLORIDE 0.9 % IV SOLN: 1.0000 mg | Freq: Once | INTRAVENOUS | Status: DC

## 2023-07-15 MED ORDER — METOPROLOL TARTRATE 5 MG/5ML IV SOLN
5.0000 mg | Freq: Four times a day (QID) | INTRAVENOUS | Status: DC
  Administered 2023-07-15 – 2023-07-17 (×10): 5 mg via INTRAVENOUS
  Filled 2023-07-15 (×10): qty 5

## 2023-07-15 MED ORDER — ONDANSETRON HCL 4 MG/2ML IJ SOLN
4.0000 mg | Freq: Four times a day (QID) | INTRAMUSCULAR | Status: DC | PRN
  Administered 2023-07-17: 4 mg via INTRAVENOUS
  Filled 2023-07-15: qty 2

## 2023-07-15 MED ORDER — POLYETHYLENE GLYCOL 3350 17 G PO PACK
17.0000 g | PACK | Freq: Every day | ORAL | Status: DC
  Administered 2023-07-16 – 2023-07-18 (×3): 17 g
  Filled 2023-07-15 (×3): qty 1

## 2023-07-15 MED ORDER — SODIUM CHLORIDE 0.9 % IV SOLN: 1.0000 mg | Freq: Every day | INTRAVENOUS | Status: DC

## 2023-07-15 MED ORDER — MAGNESIUM SULFATE IN D5W 1-5 GM/100ML-% IV SOLN
1.0000 g | Freq: Once | INTRAVENOUS | Status: AC
  Administered 2023-07-15: 1 g via INTRAVENOUS
  Filled 2023-07-15: qty 100

## 2023-07-15 MED ORDER — ONDANSETRON HCL 4 MG PO TABS: 4.0000 mg | ORAL_TABLET | Freq: Four times a day (QID) | ORAL | Status: DC | PRN

## 2023-07-15 MED ORDER — LORAZEPAM 1 MG PO TABS
1.0000 mg | ORAL_TABLET | ORAL | Status: DC | PRN
  Administered 2023-07-17: 3 mg
  Filled 2023-07-15: qty 3

## 2023-07-15 MED ORDER — DEXTROSE 50 % IV SOLN: 25.0000 mL | Freq: Once | INTRAVENOUS | Status: DC

## 2023-07-15 MED ORDER — ADULT MULTIVITAMIN LIQUID CH
15.0000 mL | Freq: Every day | ORAL | Status: AC
  Administered 2023-07-15: 15 mL
  Filled 2023-07-15: qty 15

## 2023-07-15 MED ORDER — FOLIC ACID 1 MG PO TABS: 1.0000 mg | ORAL_TABLET | Freq: Every day | ORAL | Status: DC

## 2023-07-15 MED ORDER — SODIUM CHLORIDE 0.9% FLUSH
10.0000 mL | Freq: Two times a day (BID) | INTRAVENOUS | Status: DC
  Administered 2023-07-15 – 2023-07-16 (×3): 10 mL
  Administered 2023-07-17: 20 mL
  Administered 2023-07-17 – 2023-07-24 (×11): 10 mL

## 2023-07-15 NOTE — Progress Notes (Signed)
Gastroenterology Progress Note   Referring Provider: No ref. provider found Primary Care Physician:  Rica Records, FNP Primary Gastroenterologist:  Dr. Marletta Lor  Patient ID: Heidi [name]; [id number]; 1971-01-28   Subjective:    Patient remains sedated. Per nursing staff no BMs since early on July 23. Tmax this morning of 101.74F. Systolic presssures in the 170s.   Objective:   Vital signs in last 24 hours: Temp:  [97.6 F (36.4 C)-101.2 F (38.4 C)] 101.2 F (38.4 C) (07/25 0810) Pulse Rate:  [70-111] 98 (07/25 0700) Resp:  [13-33] 33 (07/25 0700) BP: (139-188)/(91-120) 178/100 (07/25 0700) SpO2:  [98 %-100 %] 98 % (07/25 0700) Weight:  [68.8 kg] 68.8 kg (07/25 0434) Last BM Date : 07/13/23 General:   Sedated. Some movement with touch. NAD Head:  Normocephalic and atraumatic. Eyes:  Sclera clear, no icterus.  Chest: CTA bilaterally without rales, rhonchi, crackles.    Heart:  Regular rate and rhythm; no murmurs, clicks, rubs,  or gallops. Abdomen:  Distended, no bowel sounds audible at this time.   Extremities:  Without clubbing, deformity or edema. Neurologic:  Alert and  oriented x4;  grossly normal neurologically. Skin:  Intact without significant lesions or rashes. Psych:  Alert and cooperative. Normal mood and affect.  Intake/Output from previous day: 07/24 0701 - 07/25 0700 In: 2268.1 [I.V.:2170.1; IV Piggyback:98] Out: 1000 [Urine:550; Emesis/NG output:450] Intake/Output this shift: No intake/output data recorded.  Lab Results: CBC Recent Labs    07/13/23 0532 07/15/23 0600  WBC 7.0 15.8*  HGB 12.4 10.7*  HCT 37.9 33.4*  MCV 92.4 93.0  PLT 158 215   BMET Recent Labs    07/13/23 0532 07/14/23 0456 07/15/23 0600  NA 138 142 145  K 3.4* 3.4* 3.4*  CL 107 112* 115*  CO2 21* 22 20*  GLUCOSE 127* 136* 85  BUN 9 13 14   CREATININE 0.59 0.47 0.74  CALCIUM 9.7 9.1 9.1   LFTs Recent Labs    07/14/23 0456 07/15/23 0600  BILITOT 0.8  1.0  ALKPHOS 53 75  AST 21 29  ALT 19 19  PROT 6.0* 6.4*  ALBUMIN 2.8* 2.9*   No results for input(s): "LIPASE" in the last 72 hours. PT/INR No results for input(s): "LABPROT", "INR" in the last 72 hours.       Imaging Studies: [place] EKG SITE RITE  Result Date: 07/14/2023 If Site Rite image not attached, placement could not be confirmed due to current cardiac rhythm.  DG Abd Portable 1V  Result Date: 07/14/2023 CLINICAL DATA:  NG tube placement EXAM: PORTABLE ABDOMEN - 1 VIEW COMPARISON:  Previous studies including the examination none earlier today FINDINGS: There is interval placement of NG tube with its tip and side port within the stomach. Dilation of small-bowel loops in central abdomen has not changed. Gas is present in ascending and transverse colon. IMPRESSION: Tip of enteric tube is seen in the stomach. No change is seen in small bowel dilation. Electronically Signed   By: Ernie Avena M.D.   On: 07/14/2023 13:11   DG Abd 1 View  Result Date: 07/14/2023 CLINICAL DATA:  Abdominal distention EXAM: ABDOMEN - 1 VIEW COMPARISON:  Previous studies including the examination of 07/13/2023 FINDINGS: There is dilation of small-bowel loops in upper and mid abdomen. Gas is present in transverse colon. There is no demonstrable gas in rectosigmoid. No significant changes are noted in small bowel dilation. IMPRESSION: There is dilation of small bowel loops suggesting possible ileus or  partial obstruction. No significant changes are noted. Electronically Signed   By: Ernie Avena M.D.   On: 07/14/2023 13:09   DG Abd 1 View  Result Date: 07/13/2023 CLINICAL DATA:  Follow-up ileus, initial encounter EXAM: ABDOMEN - 1 VIEW COMPARISON:  None Available. FINDINGS: Scattered large and small bowel gas is noted. Multiple dilated loops of small bowel are again noted similar to that seen on the prior exam. No free air is seen. No mass lesion is noted. No bony abnormality is seen. IMPRESSION:  Stable small bowel dilatation centrally. Electronically Signed   By: Alcide Clever M.D.   On: 07/13/2023 20:09   DG Abd 1 View  Result Date: 07/13/2023 CLINICAL DATA:  Abdominal distension. EXAM: ABDOMEN - 1 VIEW COMPARISON:  07/12/2023 FINDINGS: Persistent gaseous distension of bowel loops throughout the abdomen and pelvis. The degree of bowel distension has minimally changed. Upper abdomen is incompletely imaged and not clear if nasogastric tube is still present. Cannot evaluate for free air on the supine image. IMPRESSION: Persistent gaseous distension of small and large bowel loops. Based on the previous CT findings, a distal colonic obstruction cannot be excluded. Bowel gas distension has minimally changed. Electronically Signed   By: Richarda Overlie M.D.   On: 07/13/2023 16:33   DG Abd 1 View  Result Date: 07/12/2023 CLINICAL DATA:  Nasogastric tube placement. EXAM: ABDOMEN - 1 VIEW COMPARISON:  Radiograph and CT earlier today FINDINGS: Tip and side port of the enteric tube below the diaphragm in the stomach. Gaseous bowel distension persists throughout the abdomen. IMPRESSION: Tip and side port of the enteric tube below the diaphragm in the stomach. Electronically Signed   By: Narda Rutherford M.D.   On: 07/12/2023 17:06   CT ABDOMEN PELVIS WO CONTRAST  Result Date: 07/12/2023 CLINICAL DATA:  Abdominal pain, acute. EXAM: CT ABDOMEN AND PELVIS WITHOUT CONTRAST TECHNIQUE: Multidetector CT imaging of the abdomen and pelvis was performed following the standard protocol without IV contrast. RADIATION DOSE REDUCTION: This exam was performed according to the departmental dose-optimization program which includes automated exposure control, adjustment of the mA and/or kV according to patient size and/or use of iterative reconstruction technique. COMPARISON:  Abdominal radiographs 07/12/2023. Abdominal ultrasound 07/06/2023. FINDINGS: Lower chest: Trace bilateral pleural effusions with associated dependent  atelectasis at both lung bases. No consolidation or significant pericardial fluid. There is moderate distal esophageal wall thickening which is nonspecific. Hepatobiliary: No suspicious hepatic findings are identified on noncontrast imaging. In the dome of the left hepatic lobe, there is a 9 mm low-density lesion which is likely a cyst. No evidence of gallstones, gallbladder wall thickening or biliary dilatation. Pancreas: Unremarkable. No pancreatic ductal dilatation or surrounding inflammatory changes. Spleen: Normal in size without focal abnormality. Adrenals/Urinary Tract: Both adrenal glands appear normal. No evidence of urinary tract calculus, suspicious renal lesion or hydronephrosis. Mild nonspecific perinephric soft tissue stranding bilaterally. The bladder appears unremarkable for its degree of distention. Stomach/Bowel: There may be a minimal amount of enteric contrast dependently in the proximal stomach. The stomach and proximal small bowel are not significantly distended. However, as seen on earlier radiographs, there are multiple dilated and fluid-filled loops of mid to distal small bowel and colon. The colon is dilated into the mid descending colon. The sigmoid colon and rectum are relatively decompressed, although did no focal transition point or obvious colonic mass is identified on this noncontrast study. There is no bowel wall thickening or pneumatosis. There is some high density material within the lumen  of the appendix, and the appendix appears mildly distended, although demonstrates no wall thickening or focal surrounding inflammation. A rectal tube is in place. Vascular/Lymphatic: There are no enlarged abdominal or pelvic lymph nodes. Aortic and branch vessel atherosclerosis without evidence of aneurysm. Vascular patency not addressed without contrast. Reproductive: The uterus and ovaries appear normal. No adnexal mass. Other: Moderate amount of low-density ascites. There is soft tissue  stranding throughout the mesenteric fat. Possible peritoneal nodularity in the right mid abdomen measuring up to 2.3 x 1.2 cm on image 44/2. No pneumoperitoneum or abdominal wall hernia identified. Musculoskeletal: No acute or significant osseous findings. Unless specific follow-up recommendations are mentioned in the findings or impression sections, no imaging follow-up of any mentioned incidental findings is recommended. IMPRESSION: 1. Multiple dilated and fluid-filled loops of mid to distal small bowel and colon with relative decompression of the sigmoid colon and rectum. No focal transition point or obvious colonic mass is identified on this study which is limited by the lack of intravenous and enteric contrast. Findings could be secondary to an ileus from underlying peritonitis, although are concerning for possible distal colonic obstruction. Recommend further evaluation with colonoscopy. 2. Moderate amount of low-density ascites with soft tissue stranding throughout the mesenteric fat and possible peritoneal nodularity in the right mid abdomen. No evidence of bowel perforation. Cannot exclude peritoneal carcinomatosis. 3. Mildly prominent appendix without evidence of acute inflammation. 4. Trace bilateral pleural effusions with associated dependent atelectasis at both lung bases. 5. Distal esophageal wall thickening, nonspecific, but potentially related to esophagitis. 6.  Aortic Atherosclerosis (ICD10-I70.0). Electronically Signed   By: Carey Bullocks M.D.   On: 07/12/2023 13:24   DG Abd 1 View  Result Date: 07/12/2023 CLINICAL DATA:  Abdominal pain EXAM: ABDOMEN - 1 VIEW COMPARISON:  None Available. FINDINGS: Significantly dilated bowel loops. No definite free air on this limited supine radiograph. No abnormal calcifications identified although bowel loops limited assessment. Lower lumbar degenerative change. IMPRESSION: Significantly dilated bowel loops which could represent obstruction or ileus.  Consider CT of the abdomen/pelvis for further evaluation. Electronically Signed   By: Feliberto Harts M.D.   On: 07/12/2023 08:31   DG CHEST PORT 1 VIEW  Result Date: 07/09/2023 CLINICAL DATA:  Cough, alcohol withdrawal. EXAM: PORTABLE CHEST 1 VIEW COMPARISON:  Chest radiograph 04/08/2023 FINDINGS: The cardiomediastinal silhouette is normal There is no focal consolidation or pulmonary edema. There is no pleural effusion or pneumothorax There is no acute osseous abnormality. IMPRESSION: No radiographic evidence of acute cardiopulmonary process. Electronically Signed   By: Lesia Hausen M.D.   On: 07/09/2023 12:12   US Abdomen Limited RUQ (LIVER/GB)  Result Date: 07/06/2023 CLINICAL DATA:  Transaminitis EXAM: ULTRASOUND ABDOMEN LIMITED RIGHT UPPER QUADRANT COMPARISON:  None Available. FINDINGS: Gallbladder: No gallstones or wall thickening visualized. No sonographic Murphy sign noted by sonographer. Common bile duct: Diameter: 0.3 cm Liver: No focal lesion identified. Increased parenchymal echogenicity. Portal vein is patent on color Doppler imaging with normal direction of blood flow towards the liver. Other: None. IMPRESSION: 1. Hepatic steatosis. 2. Otherwise unremarkable right upper quadrant ultrasound. Electronically Signed   By: Jearld Lesch M.D.   On: 07/06/2023 14:47  [2 weeks]  Assessment:   Heidi [name] is a 52 year old female with history of HTN, alcohol abuse, who presented to the ED 7/15 via EMS due to generalized bodyaches, n/v which she thought was secondary to alcohol withdrawal. Found to be mildly febrile at 100.2, also with hypokalemia, mildly elevated LFTs and bilirubin.  RUQ ultrasound with hepatic steatosis.  She was admitted on CIWA protocol.  Ultimately developed severe alcohol withdrawal, DTs, and started on Precedex on 7/18.   Vomiting/dilated small bowel/colon: developed projectile vomiting x2 on evening of 7/21, distention of abdomen as well. Significantly dilated bowel  loops on abd xray. CT A/P wo contrast showed dilated small bowel and colon with sigmoid colon/rectum decompressed. Moderate low density ascites with soft tissue stranding throughout  mesenteric fat/possible peritoneal nodularity in right mid abdomen. No evidence of perforation. Peritoneal carcinomatosis not excluded. Mildly prominent appendix without acute inflammation.   Had from NG tube Monday, some stool output noted in flexiseal July 23. She had flex sig 7/23 for persistent gaseous distention of small/large bowel loops on follow up KUB. FS showed erythematous mucosa in the transverse colon. No changes concerning for necrosis, Dilation in the descending/transverse  colon. Fluid aspiration performed. Stool in the rectum,sigmoid descending and transverse colon.    Repeat KUB after flex sig with stable small bowel dilatation centrally. NG was removed by patient early 7/24 but replaced for further decompression and she is not passing much stool/flatus currently. NG output 450 cc in last 24 hours. Bowel sounds remain very minimal yesterday, I did not appreciate any on my exam today. Abdomen remains distended. Clinical picture thus far most consistent with ileus/Ogilvie's type syndrome in setting of acute medical illness. Although potential dilated bowels in 04/2023 on lumbar spine films.   Fever: WBC up over 15000. Tmax 101.2. Blood cultures drawn. Lactic acid normal. Procalcitonin pending. Abd xray pending.     Plan:   Keep potassium over 4 and magnesium over 2.  Mobilize patient from one side to the other every 1-2 hours.  Minimize opiates and anticholinergic use.  Await KUB results. May need to move forward with neostigmine if patient remains candidate, relatively contraindicated with use of beta blockers.     LOS: 10 days   Leanna Battles. [place] Boos [facility] [phone] 7/25/202410:15 AM

## 2023-07-15 NOTE — Progress Notes (Signed)
Spoke with son via telephone for PICC consent.  After explaining procedure, purpose, risk and benefit son requesting update on patient status.  Explained that IV team is not on the unit and unable to provide this update, wishes to wait until update received to give consent.  Spoke with Norva Pavlov, RN and requested that he call son and give update and complete consent if son is agreeable.

## 2023-07-15 NOTE — Progress Notes (Signed)
PROGRESS NOTE  Heidi [name] ZOX:[id number] DOB: 1971-02-21 DOA: 07/05/2023 PCP: Rica Records, FNP  Brief History:   52 y.o. female with medical history significant of hypertension, alcohol abuse, tobacco use disorder who presents to the emergency department from home via EMS due to complaints of generalized bodyaches, nausea, vomiting which she thinks was related to alcohol withdrawal. Patient usually drinks 4 40 ounce beers daily, but she only had 2 12 ounce beers today.  Patient complained of pain in chest, legs, arms and endorsed having had some falls without any injury.  Her hospitalization has been prolonged secondary to delirium and agitation presumably from alcohol withdrawal.  She has been on Precedex since 07/07/2023.  Attempts at weaning had resulted in increased agitation.  Unfortunately, her hospitalization has been prolonged secondary to development of ileus.  NG was inserted to low intermittent suction on 07/14/2023.  PICC line was requested on 07/14/2023 with plans for TPN.   Assessment/Plan: Acute metabolic encephalopathy/delirium tremens -Patient has been on Precedex and 07/07/2023 -Attempts at weaning has resulted in increased agitation -Unfortunately, the patient's ileus has limited oral medications to help mitigate her agitation -Start clonidine TTS 1 patch and titrate as needed -Continue IV Precedex for now -Librium was discontinued -Continue high-dose thiamine another 24 hours -07/14/23 Repeat UA 0-5WBC -B12--1649 -Ammonia--25 -Folic acid--8.7 -TSH--2.866 -UDS positive for benzodiazepine -07/14/23--add clonidine TTS-1 -continue to wean precedex   Abdominal ileus -07/12/2023 CT AP--multiple dilated fluid-filled loops of SB and colon -Appreciate GI>>possible neostigmine -07/13/2023 flex sig--no obstruction noted--erythematous mucosa in the transverse colon with dilated in the descending, transverse: -07/14/2023--NG inserted to low intermittent  suction -Appreciate general surgery consult and assistance -Remain n.p.o. -NG output remains 1000 cc last 24 hours  Fever -developed fever 101.2 in am 7/25 -blood cultures -UA neg for pyuria -CXR -KUB -PCT -lactic acid -start empiric abx -suspect aspiration pneumonitis  FEN -Replace potassium -Place PICC line for TPN initiation -Continue D5 normal saline for now   Essential hypertension -Irbesartan and diltiazem were initially discontinued secondary to low blood pressure -Patient is now hypertensive again -clonidine TTS-1 started 7/24 -continue IV lopressor   Thrombocytopenia -Secondary to myelosuppression from alcohol use   Transaminasemia -due to Etoh -improved   Tobacco cessation -NicoDerm patch -Cessation discussed   Hypokalemia -Replete -Check magnesium 2.0                 Family Communication:  no Family at bedside   Consultants:  PCCM   Code Status:  FULL    DVT Prophylaxis:  SCDs     Procedures: As Listed in Progress Note Above   Antibiotics: None        Subjective: Pt is encephalopathic, intermittently agitated.  On IV precedex.  ROS not possible  Objective: Vitals:   07/15/23 0500 07/15/23 0600 07/15/23 0700 07/15/23 0810  BP: (!) 164/97 (!) 173/106 (!) 178/100   Pulse: 100 95 98   Resp: (!) 26 (!) 21 (!) 33   Temp:    (!) 101.2 F (38.4 C)  TempSrc:    Axillary  SpO2: 100% 99% 98%   Weight:      Height:        Intake/Output Summary (Last 24 hours) at 07/15/2023 0945 Last data filed at 07/15/2023 0600 Gross per 24 hour  Intake 2268.09 ml  Output 1000 ml  Net 1268.09 ml   Weight change: 1.6 kg Exam:  General:  Pt is sedated.  Does  not follow commands.  Intermittently agitated HEENT: No icterus, No thrush, No neck mass, [place]/AT Cardiovascular: RRR, S1/S2, no rubs, no gallops Respiratory: bibasilar rales.  No wheeze Abdomen: Soft/+BS, non tender, non distended, no guarding Extremities: No edema, No lymphangitis,  No petechiae, No rashes, no synovitis   Data Reviewed: I have personally reviewed following labs and imaging studies Basic Metabolic Panel: Recent Labs  Lab 07/11/23 0426 07/12/23 0327 07/13/23 0532 07/14/23 0456 07/15/23 0600  NA 136 133* 138 142 145  K 3.1* 3.1* 3.4* 3.4* 3.4*  CL 111 107 107 112* 115*  CO2 19* 19* 21* 22 20*  GLUCOSE 115* 152* 127* 136* 85  BUN <5* 6 9 13 14   CREATININE 0.45 0.51 0.59 0.47 0.74  CALCIUM 9.2 8.9 9.7 9.1 9.1  MG 1.7  --  1.9  --  2.0  PHOS  --   --   --   --  2.9   Liver Function Tests: Recent Labs  Lab 07/09/23 0757 07/10/23 0447 07/11/23 0426 07/14/23 0456 07/15/23 0600  AST 42* 34 22 21 29   ALT 40 32 27 19 19   ALKPHOS 67 65 63 53 75  BILITOT 1.2 0.9 0.9 0.8 1.0  PROT 7.1 6.8 6.7 6.0* 6.4*  ALBUMIN 3.7 3.4* 3.2* 2.8* 2.9*   Recent Labs  Lab 07/12/23 0327  LIPASE 41   Recent Labs  Lab 07/15/23 0600  AMMONIA 25   Coagulation Profile: No results for input(s): "INR", "PROTIME" in the last 168 hours. CBC: Recent Labs  Lab 07/10/23 0447 07/11/23 0426 07/12/23 0327 07/13/23 0532 07/15/23 0600  WBC 4.4 4.3 2.2* 7.0 15.8*  HGB 10.7* 11.0* 12.7 12.4 10.7*  HCT 33.4* 33.4* 39.0 37.9 33.4*  MCV 94.1 92.3 92.9 92.4 93.0  PLT 56* 105* 112* 158 215   Cardiac Enzymes: No results for input(s): "CKTOTAL", "CKMB", "CKMBINDEX", "TROPONINI" in the last 168 hours. BNP: Invalid input(s): "POCBNP" CBG: Recent Labs  Lab 07/11/23 0513 07/15/23 0006 07/15/23 0048 07/15/23 0558 07/15/23 0742  GLUCAP 126* 79 147* 80 93   HbA1C: No results for input(s): "HGBA1C" in the last 72 hours. Urine analysis:    Component Value Date/Time   COLORURINE AMBER (A) 07/14/2023 1453   APPEARANCEUR HAZY (A) 07/14/2023 1453   LABSPEC 1.030 07/14/2023 1453   PHURINE 5.0 07/14/2023 1453   GLUCOSEU NEGATIVE 07/14/2023 1453   HGBUR NEGATIVE 07/14/2023 1453   BILIRUBINUR NEGATIVE 07/14/2023 1453   KETONESUR NEGATIVE 07/14/2023 1453    PROTEINUR 30 (A) 07/14/2023 1453   UROBILINOGEN 0.2 01/04/2012 1330   NITRITE NEGATIVE 07/14/2023 1453   LEUKOCYTESUR NEGATIVE 07/14/2023 1453   Sepsis Labs: @LABRCNTIP (procalcitonin:4,lacticidven:4) ) Recent Results (from the past 240 hour(s))  MRSA Next Gen by PCR, Nasal     Status: None   Collection Time: 07/05/23 10:10 PM   Specimen: Nasal Mucosa; Nasal Swab  Result Value Ref Range Status   MRSA by PCR Next Gen NOT DETECTED NOT DETECTED Final    Comment: (NOTE) The GeneXpert MRSA Assay (FDA approved for NASAL specimens only), is one component of a comprehensive MRSA colonization surveillance program. It is not intended to diagnose MRSA infection nor to guide or monitor treatment for MRSA infections. Test performance is not FDA approved in patients less than 62 years old. Performed at [facility], [address] [place], [place] [address]   Culture, blood (Routine X 2) w Reflex to ID Panel     Status: None   Collection Time: 07/05/23 11:45 PM   Specimen:  BLOOD LEFT ARM  Result Value Ref Range Status   Specimen Description BLOOD LEFT ARM  Final   Special Requests   Final    BOTTLES DRAWN AEROBIC AND ANAEROBIC Blood Culture adequate volume   Culture   Final    NO GROWTH 5 DAYS Performed at [facility], [address] [place], [place] [address]    Report Status 07/11/2023 FINAL  Final  Culture, blood (Routine X 2) w Reflex to ID Panel     Status: None   Collection Time: 07/05/23 11:48 PM   Specimen: BLOOD LEFT HAND  Result Value Ref Range Status   Specimen Description BLOOD LEFT HAND  Final   Special Requests   Final    BOTTLES DRAWN AEROBIC AND ANAEROBIC Blood Culture adequate volume   Culture   Final    NO GROWTH 5 DAYS Performed at [facility], [address], [place], [place] [address]    Report Status 07/11/2023 FINAL  Final  Culture, blood (Routine X 2) w Reflex to ID Panel     Status: None   Collection Time: 07/09/23  9:09 AM   Specimen: BLOOD  Result Value  Ref Range Status   Specimen Description BLOOD LEFT ASSIST CONTROL  Final   Special Requests   Final    BOTTLES DRAWN AEROBIC AND ANAEROBIC Blood Culture adequate volume   Culture   Final    NO GROWTH 6 DAYS Performed at [facility], [address] [place], [place] [address]    Report Status 07/15/2023 FINAL  Final  Culture, blood (Routine X 2) w Reflex to ID Panel     Status: None   Collection Time: 07/09/23  9:09 AM   Specimen: BLOOD  Result Value Ref Range Status   Specimen Description BLOOD LEFT HAND  Final   Special Requests   Final    BOTTLES DRAWN AEROBIC AND ANAEROBIC Blood Culture adequate volume   Culture   Final    NO GROWTH 6 DAYS Performed at [facility], [address], [place], [place] [address]    Report Status 07/15/2023 FINAL  Final     Scheduled Meds:  bisacodyl  10 mg Rectal Daily   Chlorhexidine Gluconate Cloth  6 each Topical Daily   cloNIDine  0.1 mg Transdermal Weekly   folic acid  1 mg Intravenous Daily   metoprolol tartrate  5 mg Intravenous Q6H   multivitamin  15 mL Per Tube Daily   nicotine  14 mg Transdermal Daily   [START ON 07/16/2023] polyethylene glycol  17 g Per Tube Daily   senna-docusate  1 tablet Per Tube BID   Continuous Infusions:  dexmedetomidine (PRECEDEX) IV infusion 0.8 mcg/kg/hr (07/15/23 0832)   dextrose 5 % and 0.9 % NaCl with KCl 20 mEq/L 75 mL/hr at 07/15/23 0709   dextrose 5 % and 0.9 % NaCl with KCl 20 mEq/L     thiamine (VITAMIN B1) injection 500 mg (07/15/23 0827)   TPN ADULT (ION)      Procedures/Studies: [place] EKG SITE RITE  Result Date: 07/14/2023 If Site Rite image not attached, placement could not be confirmed due to current cardiac rhythm.  DG Abd Portable 1V  Result Date: 07/14/2023 CLINICAL DATA:  NG tube placement EXAM: PORTABLE ABDOMEN - 1 VIEW COMPARISON:  Previous studies including the examination none earlier today FINDINGS: There is interval placement of NG tube with its tip and side port within the  stomach. Dilation of small-bowel loops in central abdomen has not changed. Gas  is present in ascending and transverse colon. IMPRESSION: Tip of enteric tube is seen in the stomach. No change is seen in small bowel dilation. Electronically Signed   By: Ernie Avena M.D.   On: 07/14/2023 13:11   DG Abd 1 View  Result Date: 07/14/2023 CLINICAL DATA:  Abdominal distention EXAM: ABDOMEN - 1 VIEW COMPARISON:  Previous studies including the examination of 07/13/2023 FINDINGS: There is dilation of small-bowel loops in upper and mid abdomen. Gas is present in transverse colon. There is no demonstrable gas in rectosigmoid. No significant changes are noted in small bowel dilation. IMPRESSION: There is dilation of small bowel loops suggesting possible ileus or partial obstruction. No significant changes are noted. Electronically Signed   By: Ernie Avena M.D.   On: 07/14/2023 13:09   DG Abd 1 View  Result Date: 07/13/2023 CLINICAL DATA:  Follow-up ileus, initial encounter EXAM: ABDOMEN - 1 VIEW COMPARISON:  None Available. FINDINGS: Scattered large and small bowel gas is noted. Multiple dilated loops of small bowel are again noted similar to that seen on the prior exam. No free air is seen. No mass lesion is noted. No bony abnormality is seen. IMPRESSION: Stable small bowel dilatation centrally. Electronically Signed   By: Alcide Clever M.D.   On: 07/13/2023 20:09   DG Abd 1 View  Result Date: 07/13/2023 CLINICAL DATA:  Abdominal distension. EXAM: ABDOMEN - 1 VIEW COMPARISON:  07/12/2023 FINDINGS: Persistent gaseous distension of bowel loops throughout the abdomen and pelvis. The degree of bowel distension has minimally changed. Upper abdomen is incompletely imaged and not clear if nasogastric tube is still present. Cannot evaluate for free air on the supine image. IMPRESSION: Persistent gaseous distension of small and large bowel loops. Based on the previous CT findings, a distal colonic obstruction  cannot be excluded. Bowel gas distension has minimally changed. Electronically Signed   By: Richarda Overlie M.D.   On: 07/13/2023 16:33   DG Abd 1 View  Result Date: 07/12/2023 CLINICAL DATA:  Nasogastric tube placement. EXAM: ABDOMEN - 1 VIEW COMPARISON:  Radiograph and CT earlier today FINDINGS: Tip and side port of the enteric tube below the diaphragm in the stomach. Gaseous bowel distension persists throughout the abdomen. IMPRESSION: Tip and side port of the enteric tube below the diaphragm in the stomach. Electronically Signed   By: Narda Rutherford M.D.   On: 07/12/2023 17:06   CT ABDOMEN PELVIS WO CONTRAST  Result Date: 07/12/2023 CLINICAL DATA:  Abdominal pain, acute. EXAM: CT ABDOMEN AND PELVIS WITHOUT CONTRAST TECHNIQUE: Multidetector CT imaging of the abdomen and pelvis was performed following the standard protocol without IV contrast. RADIATION DOSE REDUCTION: This exam was performed according to the departmental dose-optimization program which includes automated exposure control, adjustment of the mA and/or kV according to patient size and/or use of iterative reconstruction technique. COMPARISON:  Abdominal radiographs 07/12/2023. Abdominal ultrasound 07/06/2023. FINDINGS: Lower chest: Trace bilateral pleural effusions with associated dependent atelectasis at both lung bases. No consolidation or significant pericardial fluid. There is moderate distal esophageal wall thickening which is nonspecific. Hepatobiliary: No suspicious hepatic findings are identified on noncontrast imaging. In the dome of the left hepatic lobe, there is a 9 mm low-density lesion which is likely a cyst. No evidence of gallstones, gallbladder wall thickening or biliary dilatation. Pancreas: Unremarkable. No pancreatic ductal dilatation or surrounding inflammatory changes. Spleen: Normal in size without focal abnormality. Adrenals/Urinary Tract: Both adrenal glands appear normal. No evidence of urinary tract calculus,  suspicious renal lesion  or hydronephrosis. Mild nonspecific perinephric soft tissue stranding bilaterally. The bladder appears unremarkable for its degree of distention. Stomach/Bowel: There may be a minimal amount of enteric contrast dependently in the proximal stomach. The stomach and proximal small bowel are not significantly distended. However, as seen on earlier radiographs, there are multiple dilated and fluid-filled loops of mid to distal small bowel and colon. The colon is dilated into the mid descending colon. The sigmoid colon and rectum are relatively decompressed, although did no focal transition point or obvious colonic mass is identified on this noncontrast study. There is no bowel wall thickening or pneumatosis. There is some high density material within the lumen of the appendix, and the appendix appears mildly distended, although demonstrates no wall thickening or focal surrounding inflammation. A rectal tube is in place. Vascular/Lymphatic: There are no enlarged abdominal or pelvic lymph nodes. Aortic and branch vessel atherosclerosis without evidence of aneurysm. Vascular patency not addressed without contrast. Reproductive: The uterus and ovaries appear normal. No adnexal mass. Other: Moderate amount of low-density ascites. There is soft tissue stranding throughout the mesenteric fat. Possible peritoneal nodularity in the right mid abdomen measuring up to 2.3 x 1.2 cm on image 44/2. No pneumoperitoneum or abdominal wall hernia identified. Musculoskeletal: No acute or significant osseous findings. Unless specific follow-up recommendations are mentioned in the findings or impression sections, no imaging follow-up of any mentioned incidental findings is recommended. IMPRESSION: 1. Multiple dilated and fluid-filled loops of mid to distal small bowel and colon with relative decompression of the sigmoid colon and rectum. No focal transition point or obvious colonic mass is identified on this study  which is limited by the lack of intravenous and enteric contrast. Findings could be secondary to an ileus from underlying peritonitis, although are concerning for possible distal colonic obstruction. Recommend further evaluation with colonoscopy. 2. Moderate amount of low-density ascites with soft tissue stranding throughout the mesenteric fat and possible peritoneal nodularity in the right mid abdomen. No evidence of bowel perforation. Cannot exclude peritoneal carcinomatosis. 3. Mildly prominent appendix without evidence of acute inflammation. 4. Trace bilateral pleural effusions with associated dependent atelectasis at both lung bases. 5. Distal esophageal wall thickening, nonspecific, but potentially related to esophagitis. 6.  Aortic Atherosclerosis (ICD10-I70.0). Electronically Signed   By: Carey Bullocks M.D.   On: 07/12/2023 13:24   DG Abd 1 View  Result Date: 07/12/2023 CLINICAL DATA:  Abdominal pain EXAM: ABDOMEN - 1 VIEW COMPARISON:  None Available. FINDINGS: Significantly dilated bowel loops. No definite free air on this limited supine radiograph. No abnormal calcifications identified although bowel loops limited assessment. Lower lumbar degenerative change. IMPRESSION: Significantly dilated bowel loops which could represent obstruction or ileus. Consider CT of the abdomen/pelvis for further evaluation. Electronically Signed   By: Feliberto Harts M.D.   On: 07/12/2023 08:31   DG CHEST PORT 1 VIEW  Result Date: 07/09/2023 CLINICAL DATA:  Cough, alcohol withdrawal. EXAM: PORTABLE CHEST 1 VIEW COMPARISON:  Chest radiograph 04/08/2023 FINDINGS: The cardiomediastinal silhouette is normal There is no focal consolidation or pulmonary edema. There is no pleural effusion or pneumothorax There is no acute osseous abnormality. IMPRESSION: No radiographic evidence of acute cardiopulmonary process. Electronically Signed   By: Lesia Hausen M.D.   On: 07/09/2023 12:12   US Abdomen Limited RUQ  (LIVER/GB)  Result Date: 07/06/2023 CLINICAL DATA:  Transaminitis EXAM: ULTRASOUND ABDOMEN LIMITED RIGHT UPPER QUADRANT COMPARISON:  None Available. FINDINGS: Gallbladder: No gallstones or wall thickening visualized. No sonographic Murphy sign noted by sonographer.  Common bile duct: Diameter: 0.3 cm Liver: No focal lesion identified. Increased parenchymal echogenicity. Portal vein is patent on color Doppler imaging with normal direction of blood flow towards the liver. Other: None. IMPRESSION: 1. Hepatic steatosis. 2. Otherwise unremarkable right upper quadrant ultrasound. Electronically Signed   By: Jearld Lesch M.D.   On: 07/06/2023 14:47    Catarina Hartshorn, DO  [facility]  If 7PM-7AM, please contact night-coverage [email or web address] Password [email or web address] 07/15/2023, 9:45 AM   LOS: 10 days

## 2023-07-15 NOTE — Progress Notes (Signed)
Discussed with Dr. Tasia Catchings. Neostigmine not indicated at this time. Need to keep potassium at 4 or above and magnesium at 2 or above. Discussed with Dr. Arbutus Leas who will continue to manage electrolytes. Patient has also started TPN.   Leanna Battles. [facility] [phone] 7/25/20244:55 PM

## 2023-07-15 NOTE — Progress Notes (Addendum)
2 Days Post-Op  Subjective: Patient is a poor historian. She denies abdominal pain atm. Per nurse, patient with BM during day shift yesterday. Dulcolax given last night but no BM reported yet.  Objective: Vital signs in last 24 hours: Temp:  [97.3 F (36.3 C)-100.9 F (38.3 C)] 100.7 F (38.2 C) (07/25 0434) Pulse Rate:  [69-111] 98 (07/25 0700) Resp:  [9-33] 33 (07/25 0700) BP: (139-188)/(91-120) 178/100 (07/25 0700) SpO2:  [98 %-100 %] 98 % (07/25 0700) Weight:  [68.8 kg] 68.8 kg (07/25 0434) Last BM Date : 07/13/23  Intake/Output from previous day: 07/24 0701 - 07/25 0700 In: 2268.1 [I.V.:2170.1; IV Piggyback:98] Out: -  Intake/Output this shift: No intake/output data recorded.  General: delirious and slowed mentation Resp: CTAB HEENT: NGT in place with dark brown output Cardio: regular rate and rhythm GI: bowels sounds absent. Soft, tympanitic abdomen. Mild TTP but difficult at epigastric area and RLQ but difficult to assess  Pulses: 2+ and symmetric Neurologic: patient able to mumble some responses. A&O X0  Lab Results:  Recent Labs    07/13/23 0532 07/15/23 0600  WBC 7.0 15.8*  HGB 12.4 10.7*  HCT 37.9 33.4*  PLT 158 215   BMET Recent Labs    07/13/23 0532 07/14/23 0456  NA 138 142  K 3.4* 3.4*  CL 107 112*  CO2 21* 22  GLUCOSE 127* 136*  BUN 9 13  CREATININE 0.59 0.47  CALCIUM 9.7 9.1   PT/INR No results for input(s): "LABPROT", "INR" in the last 72 hours.  Studies/Results: [place] EKG SITE RITE  Result Date: 07/14/2023 If Site Rite image not attached, placement could not be confirmed due to current cardiac rhythm.  DG Abd Portable 1V  Result Date: 07/14/2023 CLINICAL DATA:  NG tube placement EXAM: PORTABLE ABDOMEN - 1 VIEW COMPARISON:  Previous studies including the examination none earlier today FINDINGS: There is interval placement of NG tube with its tip and side port within the stomach. Dilation of small-bowel loops in central abdomen has not  changed. Gas is present in ascending and transverse colon. IMPRESSION: Tip of enteric tube is seen in the stomach. No change is seen in small bowel dilation. Electronically Signed   By: Ernie Avena M.D.   On: 07/14/2023 13:11   DG Abd 1 View  Result Date: 07/14/2023 CLINICAL DATA:  Abdominal distention EXAM: ABDOMEN - 1 VIEW COMPARISON:  Previous studies including the examination of 07/13/2023 FINDINGS: There is dilation of small-bowel loops in upper and mid abdomen. Gas is present in transverse colon. There is no demonstrable gas in rectosigmoid. No significant changes are noted in small bowel dilation. IMPRESSION: There is dilation of small bowel loops suggesting possible ileus or partial obstruction. No significant changes are noted. Electronically Signed   By: Ernie Avena M.D.   On: 07/14/2023 13:09   DG Abd 1 View  Result Date: 07/13/2023 CLINICAL DATA:  Follow-up ileus, initial encounter EXAM: ABDOMEN - 1 VIEW COMPARISON:  None Available. FINDINGS: Scattered large and small bowel gas is noted. Multiple dilated loops of small bowel are again noted similar to that seen on the prior exam. No free air is seen. No mass lesion is noted. No bony abnormality is seen. IMPRESSION: Stable small bowel dilatation centrally. Electronically Signed   By: Alcide Clever M.D.   On: 07/13/2023 20:09   DG Abd 1 View  Result Date: 07/13/2023 CLINICAL DATA:  Abdominal distension. EXAM: ABDOMEN - 1 VIEW COMPARISON:  07/12/2023 FINDINGS: Persistent gaseous distension of bowel  loops throughout the abdomen and pelvis. The degree of bowel distension has minimally changed. Upper abdomen is incompletely imaged and not clear if nasogastric tube is still present. Cannot evaluate for free air on the supine image. IMPRESSION: Persistent gaseous distension of small and large bowel loops. Based on the previous CT findings, a distal colonic obstruction cannot be excluded. Bowel gas distension has minimally changed.  Electronically Signed   By: Richarda Overlie M.D.   On: 07/13/2023 16:33    Anti-infectives: Anti-infectives (From admission, onward)    None       Assessment/Plan: s/p Procedure(s): FLEXIBLE SIGMOIDOSCOPY  Patient is a 52 year old female who is admitted with alcohol withdrawal. She subsequently developed small bowel and large bowel dilation, most likely an adynamic ileus. Patient does not endorse abdominal pain but she is a poor historian  NGT output ~400. Abdominal exam unchanged. Recommend TPN, strict electrolyte repletion, and bowel regimen which has not been given, reached out to nurse.  - Continue bowel regimen - Continue NGT - Recommend initiation of TPN - NPO - Continue IVF - Electrolyte repletion (Mg >2, Phos >3, K >4) - AM KUB   LOS: 10 days    Dominica Severin 07/15/2023

## 2023-07-15 NOTE — Progress Notes (Signed)
Nutrition Follow-up  DOCUMENTATION CODES:   Not applicable  INTERVENTION:  - TPN management per pharmacy - Continue high-dose IV thiamine per MD. After this is completed, recommend providing thiamine 100 mg IV daily x 5-7 days due to risk for refeeding syndrome. - Continue folic acid and Multivitamin w/ minerals daily - Patient will be at risk for refeeding syndrome. Recommend monitoring potassium, phosphorus, and magnesium daily x at least 3 days, MD to replace as needed. If baseline phosphorus is low, recommend replacing prior to initiating TPN. - Recommend continuing to measure daily weights on TPN.  NUTRITION DIAGNOSIS:   Inadequate oral intake related to inability to eat as evidenced by NPO status. - Ongoing   GOAL:   Patient will meet greater than or equal to 90% of their needs - Ongoing  MONITOR:   Diet advancement, Labs, Weight trends, I & O's  REASON FOR ASSESSMENT:   NPO/Clear Liquid Diet    ASSESSMENT:   52 year old female with PMHx of HTN, EtOH abuse, tobacco use disorder admitted with generalized body aches, nausea, emesis, EtOH withdrawal. Found to have acute metabolic encephalopathy/delirium tremens, abdominal ileus.  7/15: admitted to hospital with concern for EtOH withdrawal 7/16: ordered for heart healthy diet 7/17: escalated care to ICU and Precedex started for worsening agitation 7/22: CT abd/pelvis with multiple dilated fluid-filled loops of small bowel and colon; pt made NPO 7/23: PCCM consult for agitation and encephalopathy; s/p flex sig with no obstruction noted 7/24: NG tube inserted to LIS  Plan to start TPN today. Spoke with RN, pt remains on Precedex. No BM, NGT output minimal this shift. Shares that pt is not having bowel sounds.  No family at bedside at time of RD visit.   Medications reviewed and include: Dulcolax, Folic Acid, MVI, Miralax, Senokot-S, IV Thiamine Labs reviewed: Sodium 145, Potassium 3.4, BUN 14, Creatinine 0.74, Phosphorus  2.9, Magnesium 2.0, Folate 8.7, Vitamin B12 1649 CBG: 79-147 x 24 hrs  UOP: 550 mL + 3 occurrence unmeasured UOP x 24 hours NGT output: 450 mL x 24 hrs  I/O: +5172 ML since admission  NUTRITION - FOCUSED PHYSICAL EXAM:  Flowsheet Row Most Recent Value  Orbital Region No depletion  Upper Arm Region No depletion  Thoracic and Lumbar Region No depletion  Buccal Region No depletion  Temple Region No depletion  Clavicle Bone Region No depletion  Clavicle and Acromion Bone Region No depletion  Scapular Bone Region No depletion  Dorsal Hand Unable to assess  Patellar Region No depletion  Anterior Thigh Region No depletion  Posterior Calf Region No depletion  Edema (RD Assessment) None  Hair Reviewed  Eyes Unable to assess  Mouth Unable to assess  Skin Reviewed  Nails Unable to assess   Diet Order:   Diet Order             Diet NPO time specified  Diet effective now                  EDUCATION NEEDS:   No education needs have been identified at this time  Skin:  Skin Assessment: Reviewed RN Assessment  Last BM:  07/13/23  Height:   Ht Readings from Last 1 Encounters:  07/05/23 5\' 3"  (1.6 m)   Weight:   Wt Readings from Last 1 Encounters:  07/15/23 68.8 kg   Ideal Body Weight:  52.3 kg  BMI:  Body mass index is 26.87 kg/m.  Estimated Nutritional Needs:   Kcal:  1700-1900  Protein:  85-95 grams  Fluid:  1.7-1.9 L/day   Kirby Crigler RD, LDN Clinical Dietitian See [facility] for contact information.

## 2023-07-15 NOTE — Consult Note (Signed)
PHARMACY - TOTAL PARENTERAL NUTRITION CONSULT NOTE   Indication: Prolonged ileus  Patient Measurements: Height: 5\' 3"  (160 cm) Weight: 68.8 kg (151 lb 10.8 oz) IBW/kg (Calculated) : 52.4 TPN AdjBW (KG): 55.7 Body mass index is 26.87 kg/m. Usual Weight: 67.2 kg  Assessment:   Glucose / Insulin: BG 80-93 (past 24 hours) and no insulin Electrolytes: Na 145, K 3.4, Cl 115, Ca 9.1 Renal: Scr 0.74 Hepatic: AST 29, ALT 19 Intake / Output; MIVF: UOP 0.3 mL/kg/hr; +5178 mL GI Imaging: 7/24 Abdominal US: Dilation of small-bowel loops in upper and mid abdomen. Gas is present in transverse colon. GI Surgeries / Procedures: 7/23 Flex sigmoidoscopy: Erythematous mucosa in transversa colon. No concerns for necrosis. Dilation in descending and transverse colon. Fluid aspiration performed.  Central access: None TPN start date: 7/25  Nutritional Goals: Goal TPN rate is 75 mL/hr (provides 90 g of protein and 1800 kcals per day)  RD Assessment: Estimated Needs Total Energy Estimated Needs: 1700-1900 Total Protein Estimated Needs: 85-95 grams Total Fluid Estimated Needs: 1.7-1.9 L/day  Current Nutrition:  NPO  Plan:  Start TPN at 40 mL/hr (about half-rate) at 1800 Electrolytes in TPN: Na 90mEq/L, K 14mEq/L, Ca 59mEq/L, Mg 75mEq/L, and Phos 56mmol/L. Cl:Ac 1:1 Add standard MVI and trace elements to TPN Initiate Sensitive q6h SSI and adjust as needed starting on 7/25 @ 1800 Reduce MIVF to 35 mL/hr at 1800 Monitor TPN labs on Mon/Thurs  Will M. Dareen Piano, PharmD Clinical Pharmacist 07/15/2023 8:54 AM

## 2023-07-15 NOTE — Progress Notes (Signed)
Peripherally Inserted Central Catheter Placement  The IV Nurse has discussed with the patient and/or persons authorized to consent for the patient, the purpose of this procedure and the potential benefits and risks involved with this procedure.  The benefits include less needle sticks, lab draws from the catheter, and the patient may be discharged home with the catheter. Risks include, but not limited to, infection, bleeding, blood clot (thrombus formation), and puncture of an artery; nerve damage and irregular heartbeat and possibility to perform a PICC exchange if needed/ordered by physician.  Alternatives to this procedure were also discussed.  Bard Power PICC patient education guide, fact sheet on infection prevention and patient information card has been provided to patient /or left at bedside. Consent obtained from son via telephone.   PICC Placement Documentation  PICC Double Lumen 07/15/23 Right Brachial 34 cm 0 cm (Active)  Indication for Insertion or Continuance of Line Prolonged intravenous therapies;Poor Vasculature-patient has had multiple peripheral attempts or PIVs lasting less than 24 hours 07/15/23 1450  Exposed Catheter (cm) 0 cm 07/15/23 1450  Site Assessment Clean, Dry, Intact 07/15/23 1450  Lumen #1 Status Flushed;Saline locked;Blood return noted 07/15/23 1450  Lumen #2 Status Flushed;Saline locked;Blood return noted 07/15/23 1450  Dressing Type Transparent;Securing device 07/15/23 1450  Dressing Status Antimicrobial disc in place;Clean, Dry, Intact 07/15/23 1450  Safety Lock Not Applicable 07/15/23 1450  Line Care Connections checked and tightened 07/15/23 1450  Line Adjustment (NICU/IV Team Only) No 07/15/23 1450  Dressing Intervention New dressing 07/15/23 1450  Dressing Change Due 07/22/23 07/15/23 1450       Athelene Hursey, Lajean Manes 07/15/2023, 2:52 PM

## 2023-07-16 ENCOUNTER — Inpatient Hospital Stay (HOSPITAL_COMMUNITY): Payer: Medicaid Other

## 2023-07-16 ENCOUNTER — Encounter (HOSPITAL_COMMUNITY): Payer: Self-pay | Admitting: Gastroenterology

## 2023-07-16 DIAGNOSIS — D696 Thrombocytopenia, unspecified: Secondary | ICD-10-CM | POA: Diagnosis not present

## 2023-07-16 DIAGNOSIS — R112 Nausea with vomiting, unspecified: Secondary | ICD-10-CM | POA: Diagnosis not present

## 2023-07-16 DIAGNOSIS — E876 Hypokalemia: Secondary | ICD-10-CM | POA: Diagnosis not present

## 2023-07-16 DIAGNOSIS — F10931 Alcohol use, unspecified with withdrawal delirium: Secondary | ICD-10-CM | POA: Diagnosis not present

## 2023-07-16 DIAGNOSIS — K567 Ileus, unspecified: Secondary | ICD-10-CM | POA: Diagnosis not present

## 2023-07-16 LAB — GLUCOSE, CAPILLARY
Glucose-Capillary: 109 mg/dL — ABNORMAL HIGH (ref 70–99)
Glucose-Capillary: 109 mg/dL — ABNORMAL HIGH (ref 70–99)
Glucose-Capillary: 124 mg/dL — ABNORMAL HIGH (ref 70–99)
Glucose-Capillary: 129 mg/dL — ABNORMAL HIGH (ref 70–99)
Glucose-Capillary: 80 mg/dL (ref 70–99)

## 2023-07-16 LAB — PROCALCITONIN: Procalcitonin: 0.14 ng/mL

## 2023-07-16 LAB — LACTIC ACID, PLASMA: Lactic Acid, Venous: 1.4 mmol/L (ref 0.5–1.9)

## 2023-07-16 LAB — MAGNESIUM: Magnesium: 2.1 mg/dL (ref 1.7–2.4)

## 2023-07-16 MED ORDER — TRAVASOL 10 % IV SOLN
INTRAVENOUS | Status: AC
  Filled 2023-07-16: qty 921.6

## 2023-07-16 MED ORDER — POTASSIUM CHLORIDE 10 MEQ/100ML IV SOLN
10.0000 meq | INTRAVENOUS | Status: AC
  Administered 2023-07-16 (×4): 10 meq via INTRAVENOUS
  Filled 2023-07-16 (×4): qty 100

## 2023-07-16 MED ORDER — SODIUM CHLORIDE 0.9 % IV SOLN
3.0000 g | Freq: Four times a day (QID) | INTRAVENOUS | Status: DC
  Administered 2023-07-16 – 2023-07-18 (×10): 3 g via INTRAVENOUS
  Filled 2023-07-16 (×5): qty 8
  Filled 2023-07-16: qty 3
  Filled 2023-07-16 (×7): qty 8

## 2023-07-16 MED ORDER — HYDRALAZINE HCL 20 MG/ML IJ SOLN
10.0000 mg | Freq: Four times a day (QID) | INTRAMUSCULAR | Status: DC | PRN
  Administered 2023-07-16 – 2023-07-18 (×7): 10 mg via INTRAVENOUS
  Filled 2023-07-16 (×8): qty 1

## 2023-07-16 MED ORDER — THIAMINE HCL 100 MG/ML IJ SOLN
100.0000 mg | INTRAMUSCULAR | Status: DC
  Administered 2023-07-16 – 2023-07-22 (×7): 100 mg via INTRAVENOUS
  Filled 2023-07-16 (×7): qty 2

## 2023-07-16 MED ORDER — INSULIN ASPART 100 UNIT/ML IJ SOLN
0.0000 [IU] | Freq: Four times a day (QID) | INTRAMUSCULAR | Status: DC
  Administered 2023-07-16 – 2023-07-17 (×5): 2 [IU] via SUBCUTANEOUS
  Administered 2023-07-18: 3 [IU] via SUBCUTANEOUS
  Administered 2023-07-18 (×3): 2 [IU] via SUBCUTANEOUS
  Administered 2023-07-19 (×3): 3 [IU] via SUBCUTANEOUS
  Administered 2023-07-20 (×2): 5 [IU] via SUBCUTANEOUS
  Administered 2023-07-20: 3 [IU] via SUBCUTANEOUS
  Administered 2023-07-20: 5 [IU] via SUBCUTANEOUS
  Administered 2023-07-21: 2 [IU] via SUBCUTANEOUS
  Administered 2023-07-21: 3 [IU] via SUBCUTANEOUS
  Administered 2023-07-24: 2 [IU] via SUBCUTANEOUS

## 2023-07-16 NOTE — Consult Note (Addendum)
PHARMACY - TOTAL PARENTERAL NUTRITION CONSULT NOTE   Indication: Prolonged ileus  Patient Measurements: Height: 5\' 3"  (160 cm) Weight: 72 kg (158 lb 11.7 oz) IBW/kg (Calculated) : 52.4 TPN AdjBW (KG): 55.7 Body mass index is 28.12 kg/m. Usual Weight: 67.2 kg  Assessment:   Glucose / Insulin: BG 100-136 (past 24 hours) and no insulin Electrolytes: WNL- K 3.5 CO2 19 Renal: WNL Hepatic: Albumin 2.8 Intake / Output; MIVF: UOP 0.3 mL/kg/hr; +5178 mL GI Imaging: 7/24 Abdominal US: Dilation of small-bowel loops in upper and mid abdomen. Gas is present in transverse colon. GI Surgeries / Procedures: 7/23 Flex sigmoidoscopy: Erythematous mucosa in transversa colon. No concerns for necrosis. Dilation in descending and transverse colon. Fluid aspiration performed.  Central access: PICC 7/25 TPN start date: 7/25  Nutritional Goals: Goal TPN rate is 80 mL/hr (provides 92 g of protein and 1731 kcals per day)  RD Assessment: Estimated Needs Total Energy Estimated Needs: 1700-1900 Total Protein Estimated Needs: 85-95 grams Total Fluid Estimated Needs: 1.7-1.9 L/day  Current Nutrition:  NPO  Plan:  Increase TPN to 80 mL/hr  at 1800 Electrolytes in TPN: Na 13mEq/L, K 38mEq/L, Ca 45mEq/L, Mg 39mEq/L, and Phos 87mmol/L. Cl:Ac 1:2 Add standard MVI and trace elements to TPN Initiate moderate q6h SSI and adjust as needed  Stop MIVF at 1800 Monitor TPN labs on Mon/Thurs  Judeth Cornfield, PharmD Clinical Pharmacist 07/16/2023 9:15 AM

## 2023-07-16 NOTE — Progress Notes (Addendum)
PROGRESS NOTE  Heidi [name] ZOX:[id number] DOB: [birth year] DOA: 07/05/2023 PCP: Rica Records, FNP  Brief History:   52 y.o. female with medical history significant of hypertension, alcohol abuse, tobacco use disorder who presents to the emergency department from home via EMS due to complaints of generalized bodyaches, nausea, vomiting which she thinks was related to alcohol withdrawal. Patient usually drinks 4 40 ounce beers daily, but she only had 2 12 ounce beers today.  Patient complained of pain in chest, legs, arms and endorsed having had some falls without any injury.  Her hospitalization has been prolonged secondary to delirium and agitation presumably from alcohol withdrawal.  She has been on Precedex since 07/07/2023.  Attempts at weaning had resulted in increased agitation.  Unfortunately, her hospitalization has been prolonged secondary to development of ileus.  NG was inserted to low intermittent suction on 07/14/2023.  PICC line placed on 07/15/23 and TPN was initiated due to prolonged period of no oral intake and prolonged ileus.   On 07/16/23, pt began showing signs of return of bowel function with 3 BMs in last 24 hours.  She developed a fever up to 102.2.  UA was negative.  Blood cultures were drawn and pt started on abx empirically.   Assessment/Plan: Acute metabolic encephalopathy/delirium tremens -Patient has been on Precedex and 07/07/2023 -Attempts at weaning has resulted in increased agitation -Unfortunately, the patient's ileus has limited oral medications to help mitigate her agitation -Continue IV Precedex for now>>wean -Librium was discontinued -received high-dose thiamine  -07/14/23 Repeat UA 0-5WBC -B12--1649 -Ammonia--25 -Folic acid--8.7 -TSH--2.866 -UDS positive for benzodiazepine -07/14/23--add clonidine TTS-1 -continue to wean precedex>>down to 0.7 mcg/kg/min   Abdominal ileus -07/12/2023 CT AP--multiple dilated fluid-filled loops of SB and  colon -Appreciate GI>>possible neostigmine -07/13/2023 flex sig--no obstruction noted--erythematous mucosa in the transverse colon with dilated in the descending, transverse: -07/14/2023--NG inserted to low intermittent suction -Appreciate general surgery and GI assistance -Remain n.p.o. -NG output remains 300 cc on night 7/25 night shift -passing flatus, had 3 watery BMs last 24 hours -continue LIS -optimized electrolytes   Fever -developed fever 101.2 in am 7/25 -blood cultures -UA neg for pyuria -7/25 CXR personally reviewed--increased interstitial markings -KUB--personally reviewed--persistent bowel dilatation, unchanged -PCT 0.11 -lactic acid 0.8 -start empiric abx -suspect aspiration pneumonitis   FEN -Replace potassium -7/25 PICC line for TPN initiation   Essential hypertension -Irbesartan and diltiazem were initially discontinued secondary to low blood pressure -Patient is now hypertensive again -clonidine TTS-1 started 7/24 -continue IV lopressor   Thrombocytopenia -Secondary to myelosuppression from alcohol use   Transaminasemia -due to Etoh -improved   Tobacco cessation -NicoDerm patch -Cessation discussed   Hypokalemia -Replete -Check magnesium 2.0                 Family Communication:  no Family at bedside   Consultants:  PCCM   Code Status:  FULL    DVT Prophylaxis:  SCDs     Procedures: As Listed in Progress Note Above   Antibiotics: Unasyn 7/26>>        Subjective: Patient I awakens to voice.  Mostly mumbles incoherently.  Occasionally says yes/no.  ROS limited due to encephalopathy  Objective: Vitals:   07/16/23 0300 07/16/23 0327 07/16/23 0400 07/16/23 0500  BP: 127/77  (!) 174/101 104/68  Pulse: 91  (!) 102 (!) 105  Resp: (!) 26  19 (!) 22  Temp:  98.5 F (36.9 C)  97.8  F (36.6 C)  TempSrc:  Oral  Oral  SpO2: 100%  100% 100%  Weight:    72 kg  Height:        Intake/Output Summary (Last 24 hours) at 07/16/2023  0643 Last data filed at 07/16/2023 5366 Gross per 24 hour  Intake 1012.64 ml  Output 200 ml  Net 812.64 ml   Weight change: 3.2 kg Exam:  General:  Pt is alert, does not follow commands appropriately, not in acute distress HEENT: No icterus, No thrush, No neck mass, [place]/AT Cardiovascular: RRR, S1/S2, no rubs, no gallops Respiratory: bibasilar rales.   Abdomen: Soft/+BS, mild diffuse tender, mildly distended, no guarding Extremities: No edema, No lymphangitis, No petechiae, No rashes, no synovitis   Data Reviewed: I have personally reviewed following labs and imaging studies Basic Metabolic Panel: Recent Labs  Lab 07/11/23 0426 07/12/23 0327 07/13/23 0532 07/14/23 0456 07/15/23 0600  NA 136 133* 138 142 145  K 3.1* 3.1* 3.4* 3.4* 3.4*  CL 111 107 107 112* 115*  CO2 19* 19* 21* 22 20*  GLUCOSE 115* 152* 127* 136* 85  BUN <5* 6 9 13 14   CREATININE 0.45 0.51 0.59 0.47 0.74  CALCIUM 9.2 8.9 9.7 9.1 9.1  MG 1.7  --  1.9  --  2.0  PHOS  --   --   --   --  2.9   Liver Function Tests: Recent Labs  Lab 07/09/23 0757 07/10/23 0447 07/11/23 0426 07/14/23 0456 07/15/23 0600  AST 42* 34 22 21 29   ALT 40 32 27 19 19   ALKPHOS 67 65 63 53 75  BILITOT 1.2 0.9 0.9 0.8 1.0  PROT 7.1 6.8 6.7 6.0* 6.4*  ALBUMIN 3.7 3.4* 3.2* 2.8* 2.9*   Recent Labs  Lab 07/12/23 0327  LIPASE 41   Recent Labs  Lab 07/15/23 0600  AMMONIA 25   Coagulation Profile: No results for input(s): "INR", "PROTIME" in the last 168 hours. CBC: Recent Labs  Lab 07/11/23 0426 07/12/23 0327 07/13/23 0532 07/15/23 0600 07/16/23 0604  WBC 4.3 2.2* 7.0 15.8* 15.6*  HGB 11.0* 12.7 12.4 10.7* 10.6*  HCT 33.4* 39.0 37.9 33.4* 32.7*  MCV 92.3 92.9 92.4 93.0 92.9  PLT 105* 112* 158 215 174   Cardiac Enzymes: No results for input(s): "CKTOTAL", "CKMB", "CKMBINDEX", "TROPONINI" in the last 168 hours. BNP: Invalid input(s): "POCBNP" CBG: Recent Labs  Lab 07/15/23 0558 07/15/23 0742 07/15/23 1103  07/15/23 1609 07/16/23 0042  GLUCAP 80 93 109* 100* 109*   HbA1C: No results for input(s): "HGBA1C" in the last 72 hours. Urine analysis:    Component Value Date/Time   COLORURINE AMBER (A) 07/14/2023 1453   APPEARANCEUR HAZY (A) 07/14/2023 1453   LABSPEC 1.030 07/14/2023 1453   PHURINE 5.0 07/14/2023 1453   GLUCOSEU NEGATIVE 07/14/2023 1453   HGBUR NEGATIVE 07/14/2023 1453   BILIRUBINUR NEGATIVE 07/14/2023 1453   KETONESUR NEGATIVE 07/14/2023 1453   PROTEINUR 30 (A) 07/14/2023 1453   UROBILINOGEN 0.2 01/04/2012 1330   NITRITE NEGATIVE 07/14/2023 1453   LEUKOCYTESUR NEGATIVE 07/14/2023 1453   Sepsis Labs: @LABRCNTIP (procalcitonin:4,lacticidven:4) ) Recent Results (from the past 240 hour(s))  Culture, blood (Routine X 2) w Reflex to ID Panel     Status: None   Collection Time: 07/09/23  9:09 AM   Specimen: BLOOD  Result Value Ref Range Status   Specimen Description BLOOD LEFT ASSIST CONTROL  Final   Special Requests   Final    BOTTLES DRAWN AEROBIC AND ANAEROBIC Blood Culture  adequate volume   Culture   Final    NO GROWTH 6 DAYS Performed at [facility], [address], [place], [place] [address]    Report Status 07/15/2023 FINAL  Final  Culture, blood (Routine X 2) w Reflex to ID Panel     Status: None   Collection Time: 07/09/23  9:09 AM   Specimen: BLOOD  Result Value Ref Range Status   Specimen Description BLOOD LEFT HAND  Final   Special Requests   Final    BOTTLES DRAWN AEROBIC AND ANAEROBIC Blood Culture adequate volume   Culture   Final    NO GROWTH 6 DAYS Performed at [facility], [address] [place], [place] [address]    Report Status 07/15/2023 FINAL  Final  Culture, blood (Routine X 2) w Reflex to ID Panel     Status: None (Preliminary result)   Collection Time: 07/15/23 10:08 AM   Specimen: BLOOD RIGHT HAND  Result Value Ref Range Status   Specimen Description BLOOD RIGHT HAND  Final   Special Requests   Final    BOTTLES DRAWN AEROBIC AND  ANAEROBIC Blood Culture adequate volume Performed at [facility], [address] [place], [place] [address]    Culture PENDING  Incomplete   Report Status PENDING  Incomplete  Culture, blood (Routine X 2) w Reflex to ID Panel     Status: None (Preliminary result)   Collection Time: 07/15/23 10:08 AM   Specimen: BLOOD  Result Value Ref Range Status   Specimen Description BLOOD BLOOD LEFT HAND  Final   Special Requests   Final    BOTTLES DRAWN AEROBIC AND ANAEROBIC Blood Culture adequate volume Performed at [facility], [address], [place], [place] [address]    Culture PENDING  Incomplete   Report Status PENDING  Incomplete     Scheduled Meds:  bisacodyl  10 mg Rectal Daily   Chlorhexidine Gluconate Cloth  6 each Topical Daily   cloNIDine  0.1 mg Transdermal Weekly   folic acid  1 mg Intravenous Daily   metoprolol tartrate  5 mg Intravenous Q6H   nicotine  14 mg Transdermal Daily   polyethylene glycol  17 g Per Tube Daily   senna-docusate  1 tablet Per Tube BID   sodium chloride flush  10-40 mL Intracatheter Q12H   thiamine (VITAMIN B1) injection  100 mg Intravenous Q24H   Continuous Infusions:  dexmedetomidine (PRECEDEX) IV infusion 0.7 mcg/kg/hr (07/16/23 0509)   dextrose 5 % and 0.45 % NaCl with KCl 20 mEq/L Stopped (07/15/23 1846)   dextrose 5 % and 0.9 % NaCl with KCl 20 mEq/L 35 mL/hr at 07/16/23 0509   TPN ADULT (ION) 50 mL/hr at 07/16/23 2956    Procedures/Studies: DG Abd 1 View  Result Date: 07/15/2023 CLINICAL DATA:  [id number] Ileus (HCC) [id number] EXAM: ABDOMEN - 1 VIEW COMPARISON:  07/14/2023 FINDINGS: Enteric tube remains positioned within the stomach. Persistently dilated loops of small bowel within the abdomen, similar to prior. No gross free intraperitoneal air on supine view. IMPRESSION: Persistently dilated loops of small bowel within the abdomen, ileus versus obstruction. Electronically Signed   By: Duanne Guess D.O.   On: 07/15/2023 13:17   DG CHEST PORT 1  VIEW  Result Date: 07/15/2023 CLINICAL DATA:  Fever EXAM: PORTABLE CHEST 1 VIEW COMPARISON:  07/09/2023 FINDINGS: New enteric tube which extends into the stomach. The heart size and mediastinal contours are within normal limits. Both lungs are clear. The visualized skeletal structures  are unremarkable. Gaseous distension of bowel within the imaged upper abdomen. IMPRESSION: 1. No active cardiopulmonary disease. 2. New enteric tube which extends into the stomach. Electronically Signed   By: Duanne Guess D.O.   On: 07/15/2023 13:15   [place] EKG SITE RITE  Result Date: 07/14/2023 If Site Rite image not attached, placement could not be confirmed due to current cardiac rhythm.  DG Abd Portable 1V  Result Date: 07/14/2023 CLINICAL DATA:  NG tube placement EXAM: PORTABLE ABDOMEN - 1 VIEW COMPARISON:  Previous studies including the examination none earlier today FINDINGS: There is interval placement of NG tube with its tip and side port within the stomach. Dilation of small-bowel loops in central abdomen has not changed. Gas is present in ascending and transverse colon. IMPRESSION: Tip of enteric tube is seen in the stomach. No change is seen in small bowel dilation. Electronically Signed   By: Ernie Avena M.D.   On: 07/14/2023 13:11   DG Abd 1 View  Result Date: 07/14/2023 CLINICAL DATA:  Abdominal distention EXAM: ABDOMEN - 1 VIEW COMPARISON:  Previous studies including the examination of 07/13/2023 FINDINGS: There is dilation of small-bowel loops in upper and mid abdomen. Gas is present in transverse colon. There is no demonstrable gas in rectosigmoid. No significant changes are noted in small bowel dilation. IMPRESSION: There is dilation of small bowel loops suggesting possible ileus or partial obstruction. No significant changes are noted. Electronically Signed   By: Ernie Avena M.D.   On: 07/14/2023 13:09   DG Abd 1 View  Result Date: 07/13/2023 CLINICAL DATA:  Follow-up ileus,  initial encounter EXAM: ABDOMEN - 1 VIEW COMPARISON:  None Available. FINDINGS: Scattered large and small bowel gas is noted. Multiple dilated loops of small bowel are again noted similar to that seen on the prior exam. No free air is seen. No mass lesion is noted. No bony abnormality is seen. IMPRESSION: Stable small bowel dilatation centrally. Electronically Signed   By: Alcide Clever M.D.   On: 07/13/2023 20:09   DG Abd 1 View  Result Date: 07/13/2023 CLINICAL DATA:  Abdominal distension. EXAM: ABDOMEN - 1 VIEW COMPARISON:  07/12/2023 FINDINGS: Persistent gaseous distension of bowel loops throughout the abdomen and pelvis. The degree of bowel distension has minimally changed. Upper abdomen is incompletely imaged and not clear if nasogastric tube is still present. Cannot evaluate for free air on the supine image. IMPRESSION: Persistent gaseous distension of small and large bowel loops. Based on the previous CT findings, a distal colonic obstruction cannot be excluded. Bowel gas distension has minimally changed. Electronically Signed   By: Richarda Overlie M.D.   On: 07/13/2023 16:33   DG Abd 1 View  Result Date: 07/12/2023 CLINICAL DATA:  Nasogastric tube placement. EXAM: ABDOMEN - 1 VIEW COMPARISON:  Radiograph and CT earlier today FINDINGS: Tip and side port of the enteric tube below the diaphragm in the stomach. Gaseous bowel distension persists throughout the abdomen. IMPRESSION: Tip and side port of the enteric tube below the diaphragm in the stomach. Electronically Signed   By: Narda Rutherford M.D.   On: 07/12/2023 17:06   CT ABDOMEN PELVIS WO CONTRAST  Result Date: 07/12/2023 CLINICAL DATA:  Abdominal pain, acute. EXAM: CT ABDOMEN AND PELVIS WITHOUT CONTRAST TECHNIQUE: Multidetector CT imaging of the abdomen and pelvis was performed following the standard protocol without IV contrast. RADIATION DOSE REDUCTION: This exam was performed according to the departmental dose-optimization program which  includes automated exposure control, adjustment of the  mA and/or kV according to patient size and/or use of iterative reconstruction technique. COMPARISON:  Abdominal radiographs 07/12/2023. Abdominal ultrasound 07/06/2023. FINDINGS: Lower chest: Trace bilateral pleural effusions with associated dependent atelectasis at both lung bases. No consolidation or significant pericardial fluid. There is moderate distal esophageal wall thickening which is nonspecific. Hepatobiliary: No suspicious hepatic findings are identified on noncontrast imaging. In the dome of the left hepatic lobe, there is a 9 mm low-density lesion which is likely a cyst. No evidence of gallstones, gallbladder wall thickening or biliary dilatation. Pancreas: Unremarkable. No pancreatic ductal dilatation or surrounding inflammatory changes. Spleen: Normal in size without focal abnormality. Adrenals/Urinary Tract: Both adrenal glands appear normal. No evidence of urinary tract calculus, suspicious renal lesion or hydronephrosis. Mild nonspecific perinephric soft tissue stranding bilaterally. The bladder appears unremarkable for its degree of distention. Stomach/Bowel: There may be a minimal amount of enteric contrast dependently in the proximal stomach. The stomach and proximal small bowel are not significantly distended. However, as seen on earlier radiographs, there are multiple dilated and fluid-filled loops of mid to distal small bowel and colon. The colon is dilated into the mid descending colon. The sigmoid colon and rectum are relatively decompressed, although did no focal transition point or obvious colonic mass is identified on this noncontrast study. There is no bowel wall thickening or pneumatosis. There is some high density material within the lumen of the appendix, and the appendix appears mildly distended, although demonstrates no wall thickening or focal surrounding inflammation. A rectal tube is in place. Vascular/Lymphatic: There are  no enlarged abdominal or pelvic lymph nodes. Aortic and branch vessel atherosclerosis without evidence of aneurysm. Vascular patency not addressed without contrast. Reproductive: The uterus and ovaries appear normal. No adnexal mass. Other: Moderate amount of low-density ascites. There is soft tissue stranding throughout the mesenteric fat. Possible peritoneal nodularity in the right mid abdomen measuring up to 2.3 x 1.2 cm on image 44/2. No pneumoperitoneum or abdominal wall hernia identified. Musculoskeletal: No acute or significant osseous findings. Unless specific follow-up recommendations are mentioned in the findings or impression sections, no imaging follow-up of any mentioned incidental findings is recommended. IMPRESSION: 1. Multiple dilated and fluid-filled loops of mid to distal small bowel and colon with relative decompression of the sigmoid colon and rectum. No focal transition point or obvious colonic mass is identified on this study which is limited by the lack of intravenous and enteric contrast. Findings could be secondary to an ileus from underlying peritonitis, although are concerning for possible distal colonic obstruction. Recommend further evaluation with colonoscopy. 2. Moderate amount of low-density ascites with soft tissue stranding throughout the mesenteric fat and possible peritoneal nodularity in the right mid abdomen. No evidence of bowel perforation. Cannot exclude peritoneal carcinomatosis. 3. Mildly prominent appendix without evidence of acute inflammation. 4. Trace bilateral pleural effusions with associated dependent atelectasis at both lung bases. 5. Distal esophageal wall thickening, nonspecific, but potentially related to esophagitis. 6.  Aortic Atherosclerosis (ICD10-I70.0). Electronically Signed   By: Carey Bullocks M.D.   On: 07/12/2023 13:24   DG Abd 1 View  Result Date: 07/12/2023 CLINICAL DATA:  Abdominal pain EXAM: ABDOMEN - 1 VIEW COMPARISON:  None Available.  FINDINGS: Significantly dilated bowel loops. No definite free air on this limited supine radiograph. No abnormal calcifications identified although bowel loops limited assessment. Lower lumbar degenerative change. IMPRESSION: Significantly dilated bowel loops which could represent obstruction or ileus. Consider CT of the abdomen/pelvis for further evaluation. Electronically Signed   By: Gelene Mink  Barnett Applebaum M.D.   On: 07/12/2023 08:31   DG CHEST PORT 1 VIEW  Result Date: 07/09/2023 CLINICAL DATA:  Cough, alcohol withdrawal. EXAM: PORTABLE CHEST 1 VIEW COMPARISON:  Chest radiograph 04/08/2023 FINDINGS: The cardiomediastinal silhouette is normal There is no focal consolidation or pulmonary edema. There is no pleural effusion or pneumothorax There is no acute osseous abnormality. IMPRESSION: No radiographic evidence of acute cardiopulmonary process. Electronically Signed   By: Lesia Hausen M.D.   On: 07/09/2023 12:12   US Abdomen Limited RUQ (LIVER/GB)  Result Date: 07/06/2023 CLINICAL DATA:  Transaminitis EXAM: ULTRASOUND ABDOMEN LIMITED RIGHT UPPER QUADRANT COMPARISON:  None Available. FINDINGS: Gallbladder: No gallstones or wall thickening visualized. No sonographic Murphy sign noted by sonographer. Common bile duct: Diameter: 0.3 cm Liver: No focal lesion identified. Increased parenchymal echogenicity. Portal vein is patent on color Doppler imaging with normal direction of blood flow towards the liver. Other: None. IMPRESSION: 1. Hepatic steatosis. 2. Otherwise unremarkable right upper quadrant ultrasound. Electronically Signed   By: Jearld Lesch M.D.   On: 07/06/2023 14:47    Catarina Hartshorn, DO  [facility]  If 7PM-7AM, please contact night-coverage [email or web address] Password [facility] 07/16/2023, 6:43 AM   LOS: 11 days

## 2023-07-16 NOTE — Progress Notes (Signed)
Gastroenterology Progress Note   Referring Provider: No ref. provider found Primary Care Physician:  Rica Records, FNP Primary Gastroenterologist:  Dr.  Patient ID: Heidi [name]; [id number]; 12-23-1970    Subjective   Multiple Bms this morning per nursing. Patient with BM getting cleaned up on my initial attempt at exam. Patient remains intermittently agitated. T max 100.2 this morning at 0800. She has been hypertensive and mildly tachycardic overnight. Remains on precedex.   Objective   Vital signs in last 24 hours Temp:  [97 F (36.1 C)-102.2 F (39 C)] 98.1 F (36.7 C) (07/26 1122) Pulse Rate:  [91-140] 99 (07/26 0700) Resp:  [17-31] 27 (07/26 0700) BP: (104-183)/(67-127) 157/94 (07/26 0700) SpO2:  [96 %-100 %] 100 % (07/26 0700) Weight:  [72 kg] 72 kg (07/26 0500) Last BM Date : 07/15/23  Physical Exam General:   Alert and oriented, pleasant Head:  Normocephalic and atraumatic. Mouth:  Without lesions, mucosa pink and moist.  Neck:  Supple, without thyromegaly or masses.  Abdomen:  distant bowel sounds, soft, distended. UTA tenderness. No masses appreciated  Neurologic: Anxious/intermittently sedated. UE restrained and with mitts.  Skin:  Warm and dry, intact without significant lesions.  Psych:  Intermittently agitated/anxious   Intake/Output from previous day: 07/25 0701 - 07/26 0700 In: 1012.6 [I.V.:1012.6] Out: 1100 [Urine:800; Emesis/NG output:300] Intake/Output this shift: Total I/O In: -  Out: 350 [Urine:350]  Lab Results  Recent Labs    07/15/23 0600 07/16/23 0604  WBC 15.8* 15.6*  HGB 10.7* 10.6*  HCT 33.4* 32.7*  PLT 215 174   BMET Recent Labs    07/14/23 0456 07/15/23 0600 07/16/23 0604  NA 142 145 141  K 3.4* 3.4* 3.5  CL 112* 115* 114*  CO2 22 20* 19*  GLUCOSE 136* 85 136*  BUN 13 14 10   CREATININE 0.47 0.74 0.64  CALCIUM 9.1 9.1 9.0   LFT Recent Labs    07/14/23 0456 07/15/23 0600 07/16/23 0604  PROT 6.0*  6.4* 6.3*  ALBUMIN 2.8* 2.9* 2.8*  AST 21 29 28   ALT 19 19 16   ALKPHOS 53 75 80  BILITOT 0.8 1.0 0.7   PT/INR No results for input(s): "LABPROT", "INR" in the last 72 hours. Hepatitis Panel No results for input(s): "HEPBSAG", "HCVAB", "HEPAIGM", "HEPBIGM" in the last 72 hours.  Studies/Results DG Abd 1 View  Result Date: 07/16/2023 CLINICAL DATA:  Ileus, abdominal distention EXAM: ABDOMEN - 1 VIEW COMPARISON:  Previous studies including the examination of 07/15/2023 FINDINGS: There are dilated small bowel loops measuring up to 5.4 cm in diameter. Small-bowel dilation appears slightly more prominent. Mucosal folds in the small bowel loops appear prominent. Gas is present in right colon. Tip of NG tube is seen in the stomach. IMPRESSION: There is dilation of small-bowel loops along with mucosal thickening. Findings may suggest ileus or enteritis or distal colonic obstruction. Electronically Signed   By: Ernie Avena M.D.   On: 07/16/2023 11:41   DG Abd 1 View  Result Date: 07/15/2023 CLINICAL DATA:  [id number] Ileus [facility]) [id number] EXAM: ABDOMEN - 1 VIEW COMPARISON:  07/14/2023 FINDINGS: Enteric tube remains positioned within the stomach. Persistently dilated loops of small bowel within the abdomen, similar to prior. No gross free intraperitoneal air on supine view. IMPRESSION: Persistently dilated loops of small bowel within the abdomen, ileus versus obstruction. Electronically Signed   By: Duanne Guess D.O.   On: 07/15/2023 13:17   DG CHEST PORT 1 VIEW  Result Date: 07/15/2023  CLINICAL DATA:  Fever EXAM: PORTABLE CHEST 1 VIEW COMPARISON:  07/09/2023 FINDINGS: New enteric tube which extends into the stomach. The heart size and mediastinal contours are within normal limits. Both lungs are clear. The visualized skeletal structures are unremarkable. Gaseous distension of bowel within the imaged upper abdomen. IMPRESSION: 1. No active cardiopulmonary disease. 2. New enteric tube which extends  into the stomach. Electronically Signed   By: Duanne Guess D.O.   On: 07/15/2023 13:15   [place] EKG SITE RITE  Result Date: 07/14/2023 If Site Rite image not attached, placement could not be confirmed due to current cardiac rhythm.  DG Abd Portable 1V  Result Date: 07/14/2023 CLINICAL DATA:  NG tube placement EXAM: PORTABLE ABDOMEN - 1 VIEW COMPARISON:  Previous studies including the examination none earlier today FINDINGS: There is interval placement of NG tube with its tip and side port within the stomach. Dilation of small-bowel loops in central abdomen has not changed. Gas is present in ascending and transverse colon. IMPRESSION: Tip of enteric tube is seen in the stomach. No change is seen in small bowel dilation. Electronically Signed   By: Ernie Avena M.D.   On: 07/14/2023 13:11   DG Abd 1 View  Result Date: 07/14/2023 CLINICAL DATA:  Abdominal distention EXAM: ABDOMEN - 1 VIEW COMPARISON:  Previous studies including the examination of 07/13/2023 FINDINGS: There is dilation of small-bowel loops in upper and mid abdomen. Gas is present in transverse colon. There is no demonstrable gas in rectosigmoid. No significant changes are noted in small bowel dilation. IMPRESSION: There is dilation of small bowel loops suggesting possible ileus or partial obstruction. No significant changes are noted. Electronically Signed   By: Ernie Avena M.D.   On: 07/14/2023 13:09   DG Abd 1 View  Result Date: 07/13/2023 CLINICAL DATA:  Follow-up ileus, initial encounter EXAM: ABDOMEN - 1 VIEW COMPARISON:  None Available. FINDINGS: Scattered large and small bowel gas is noted. Multiple dilated loops of small bowel are again noted similar to that seen on the prior exam. No free air is seen. No mass lesion is noted. No bony abnormality is seen. IMPRESSION: Stable small bowel dilatation centrally. Electronically Signed   By: Alcide Clever M.D.   On: 07/13/2023 20:09   DG Abd 1 View  Result Date:  07/13/2023 CLINICAL DATA:  Abdominal distension. EXAM: ABDOMEN - 1 VIEW COMPARISON:  07/12/2023 FINDINGS: Persistent gaseous distension of bowel loops throughout the abdomen and pelvis. The degree of bowel distension has minimally changed. Upper abdomen is incompletely imaged and not clear if nasogastric tube is still present. Cannot evaluate for free air on the supine image. IMPRESSION: Persistent gaseous distension of small and large bowel loops. Based on the previous CT findings, a distal colonic obstruction cannot be excluded. Bowel gas distension has minimally changed. Electronically Signed   By: Richarda Overlie M.D.   On: 07/13/2023 16:33   DG Abd 1 View  Result Date: 07/12/2023 CLINICAL DATA:  Nasogastric tube placement. EXAM: ABDOMEN - 1 VIEW COMPARISON:  Radiograph and CT earlier today FINDINGS: Tip and side port of the enteric tube below the diaphragm in the stomach. Gaseous bowel distension persists throughout the abdomen. IMPRESSION: Tip and side port of the enteric tube below the diaphragm in the stomach. Electronically Signed   By: Narda Rutherford M.D.   On: 07/12/2023 17:06   CT ABDOMEN PELVIS WO CONTRAST  Result Date: 07/12/2023 CLINICAL DATA:  Abdominal pain, acute. EXAM: CT ABDOMEN AND PELVIS WITHOUT CONTRAST  TECHNIQUE: Multidetector CT imaging of the abdomen and pelvis was performed following the standard protocol without IV contrast. RADIATION DOSE REDUCTION: This exam was performed according to the departmental dose-optimization program which includes automated exposure control, adjustment of the mA and/or kV according to patient size and/or use of iterative reconstruction technique. COMPARISON:  Abdominal radiographs 07/12/2023. Abdominal ultrasound 07/06/2023. FINDINGS: Lower chest: Trace bilateral pleural effusions with associated dependent atelectasis at both lung bases. No consolidation or significant pericardial fluid. There is moderate distal esophageal wall thickening which is  nonspecific. Hepatobiliary: No suspicious hepatic findings are identified on noncontrast imaging. In the dome of the left hepatic lobe, there is a 9 mm low-density lesion which is likely a cyst. No evidence of gallstones, gallbladder wall thickening or biliary dilatation. Pancreas: Unremarkable. No pancreatic ductal dilatation or surrounding inflammatory changes. Spleen: Normal in size without focal abnormality. Adrenals/Urinary Tract: Both adrenal glands appear normal. No evidence of urinary tract calculus, suspicious renal lesion or hydronephrosis. Mild nonspecific perinephric soft tissue stranding bilaterally. The bladder appears unremarkable for its degree of distention. Stomach/Bowel: There may be a minimal amount of enteric contrast dependently in the proximal stomach. The stomach and proximal small bowel are not significantly distended. However, as seen on earlier radiographs, there are multiple dilated and fluid-filled loops of mid to distal small bowel and colon. The colon is dilated into the mid descending colon. The sigmoid colon and rectum are relatively decompressed, although did no focal transition point or obvious colonic mass is identified on this noncontrast study. There is no bowel wall thickening or pneumatosis. There is some high density material within the lumen of the appendix, and the appendix appears mildly distended, although demonstrates no wall thickening or focal surrounding inflammation. A rectal tube is in place. Vascular/Lymphatic: There are no enlarged abdominal or pelvic lymph nodes. Aortic and branch vessel atherosclerosis without evidence of aneurysm. Vascular patency not addressed without contrast. Reproductive: The uterus and ovaries appear normal. No adnexal mass. Other: Moderate amount of low-density ascites. There is soft tissue stranding throughout the mesenteric fat. Possible peritoneal nodularity in the right mid abdomen measuring up to 2.3 x 1.2 cm on image 44/2. No  pneumoperitoneum or abdominal wall hernia identified. Musculoskeletal: No acute or significant osseous findings. Unless specific follow-up recommendations are mentioned in the findings or impression sections, no imaging follow-up of any mentioned incidental findings is recommended. IMPRESSION: 1. Multiple dilated and fluid-filled loops of mid to distal small bowel and colon with relative decompression of the sigmoid colon and rectum. No focal transition point or obvious colonic mass is identified on this study which is limited by the lack of intravenous and enteric contrast. Findings could be secondary to an ileus from underlying peritonitis, although are concerning for possible distal colonic obstruction. Recommend further evaluation with colonoscopy. 2. Moderate amount of low-density ascites with soft tissue stranding throughout the mesenteric fat and possible peritoneal nodularity in the right mid abdomen. No evidence of bowel perforation. Cannot exclude peritoneal carcinomatosis. 3. Mildly prominent appendix without evidence of acute inflammation. 4. Trace bilateral pleural effusions with associated dependent atelectasis at both lung bases. 5. Distal esophageal wall thickening, nonspecific, but potentially related to esophagitis. 6.  Aortic Atherosclerosis (ICD10-I70.0). Electronically Signed   By: Carey Bullocks M.D.   On: 07/12/2023 13:24   DG Abd 1 View  Result Date: 07/12/2023 CLINICAL DATA:  Abdominal pain EXAM: ABDOMEN - 1 VIEW COMPARISON:  None Available. FINDINGS: Significantly dilated bowel loops. No definite free air on this limited supine  radiograph. No abnormal calcifications identified although bowel loops limited assessment. Lower lumbar degenerative change. IMPRESSION: Significantly dilated bowel loops which could represent obstruction or ileus. Consider CT of the abdomen/pelvis for further evaluation. Electronically Signed   By: Feliberto Harts M.D.   On: 07/12/2023 08:31   DG CHEST PORT  1 VIEW  Result Date: 07/09/2023 CLINICAL DATA:  Cough, alcohol withdrawal. EXAM: PORTABLE CHEST 1 VIEW COMPARISON:  Chest radiograph 04/08/2023 FINDINGS: The cardiomediastinal silhouette is normal There is no focal consolidation or pulmonary edema. There is no pleural effusion or pneumothorax There is no acute osseous abnormality. IMPRESSION: No radiographic evidence of acute cardiopulmonary process. Electronically Signed   By: Lesia Hausen M.D.   On: 07/09/2023 12:12   US Abdomen Limited RUQ (LIVER/GB)  Result Date: 07/06/2023 CLINICAL DATA:  Transaminitis EXAM: ULTRASOUND ABDOMEN LIMITED RIGHT UPPER QUADRANT COMPARISON:  None Available. FINDINGS: Gallbladder: No gallstones or wall thickening visualized. No sonographic Murphy sign noted by sonographer. Common bile duct: Diameter: 0.3 cm Liver: No focal lesion identified. Increased parenchymal echogenicity. Portal vein is patent on color Doppler imaging with normal direction of blood flow towards the liver. Other: None. IMPRESSION: 1. Hepatic steatosis. 2. Otherwise unremarkable right upper quadrant ultrasound. Electronically Signed   By: Jearld Lesch M.D.   On: 07/06/2023 14:47    Assessment  52 y.o. female with a history of HTN and alcohol abuse who presented to the ED 7/15 via EMS 2/2 body aches, N/V (thought to be alcohol withdrawal). She was found to have hypokalemia, mildly elevated LFTs/bili, and febrile on arrival. RUQ [place] with hepatic steatosis and was admitted with CIWA protocol. She was started on precedex 7/18 for alcohol withdrawal and agitation.   Dilated small bowel & colon, vomiting: Projectile vomiting x2 on 7/21 with significant abdominal distention. Dilated bowel loops on KUB. CT A/P w/o contrast with small bowel dilation and colon with sigmoid/rectum decompressed, low density ascites, no perforation. Unable to exclude peritoneal carcinomatosis and mildly prominent appendix w/o inflammation.   NGT with 850 out on Monday, previously  with flexiseal that has been removed. Flex sig on 7/23 for decompression that showed erythematous mucosa in the transverse colon and dilation in the descending/transverse colon with fluid aspiration performed and stool throughout. Frequent follow up KUBs with ongoing stable small bowel dilation. KUB today overnight. NG removed by patient on 7/24 and then replaced. 300 ml output documented overnight last night. She has had 1 BM yesterday afternoon and multiple today. Abdomen remains mildly distended but fairly soft. Bowel sounds distant/tympanic.   Fever: T max in the last 24 hours 100.2 (previously 101.2). Increasing leukocytosis with WBC stable at 15.6. Lactate and procalcitonin normal. Blood cultures with NGTD at 24 hours. Further workup per attending.   Plan / Recommendations  Daily BMP Keep potassium >4, magnesium >2. Replace today Turn patient every 1-2 hours for mobility Minimize anticholinergics and opiates No need for neostigmine or CT at this time as patient having BMs    LOS: 11 days    07/16/2023, 1:11 PM   Brooke Bonito, MSN, FNP-BC, AGACNP-BC [facility]

## 2023-07-16 NOTE — Progress Notes (Addendum)
3 Days Post-Op  Subjective: Patient is a poor historian - she is alert but mumbles responses. She denies abdominal pain. Per nurse, patient had 7 large Bms since shift start.   Objective: Vital signs in last 24 hours: Temp:  [97 F (36.1 C)-102.2 F (39 C)] 100.2 F (37.9 C) (07/26 0807) Pulse Rate:  [91-140] 99 (07/26 0700) Resp:  [17-31] 27 (07/26 0700) BP: (104-183)/(65-127) 157/94 (07/26 0700) SpO2:  [96 %-100 %] 100 % (07/26 0700) Weight:  [72 kg] 72 kg (07/26 0500) Last BM Date : 07/15/23  Intake/Output from previous day: 07/25 0701 - 07/26 0700 In: 1012.6 [I.V.:1012.6] Out: 1100 [Urine:800; Emesis/NG output:300] Intake/Output this shift: No intake/output data recorded.  General: delirious and slowed mentation Resp: CTAB HEENT: NGT in place with minimal dark brown output Cardio: regular rate and rhythm GI: Tympanitic abdomen, with greater fullness noted on left side. No tenderness to palpation, guarding or rebound Pulses: 2+ and symmetric Neurologic: patient able to mumble some responses. [name]  Lab Results:  Recent Labs    07/15/23 0600 07/16/23 0604  WBC 15.8* 15.6*  HGB 10.7* 10.6*  HCT 33.4* 32.7*  PLT 215 174   BMET Recent Labs    07/15/23 0600 07/16/23 0604  NA 145 141  K 3.4* 3.5  CL 115* 114*  CO2 20* 19*  GLUCOSE 85 136*  BUN 14 10  CREATININE 0.74 0.64  CALCIUM 9.1 9.0   PT/INR No results for input(s): "LABPROT", "INR" in the last 72 hours.  Studies/Results: DG Abd 1 View  Result Date: 07/15/2023 CLINICAL DATA:  [id number] Ileus [facility]) [phone] EXAM: ABDOMEN - 1 VIEW COMPARISON:  07/14/2023 FINDINGS: Enteric tube remains positioned within the stomach. Persistently dilated loops of small bowel within the abdomen, similar to prior. No gross free intraperitoneal air on supine view. IMPRESSION: Persistently dilated loops of small bowel within the abdomen, ileus versus obstruction. Electronically Signed   By: Duanne Guess D.O.   On: 07/15/2023  13:17   DG CHEST PORT 1 VIEW  Result Date: 07/15/2023 CLINICAL DATA:  Fever EXAM: PORTABLE CHEST 1 VIEW COMPARISON:  07/09/2023 FINDINGS: New enteric tube which extends into the stomach. The heart size and mediastinal contours are within normal limits. Both lungs are clear. The visualized skeletal structures are unremarkable. Gaseous distension of bowel within the imaged upper abdomen. IMPRESSION: 1. No active cardiopulmonary disease. 2. New enteric tube which extends into the stomach. Electronically Signed   By: Duanne Guess D.O.   On: 07/15/2023 13:15   [place] EKG SITE RITE  Result Date: 07/14/2023 If Site Rite image not attached, placement could not be confirmed due to current cardiac rhythm.  DG Abd Portable 1V  Result Date: 07/14/2023 CLINICAL DATA:  NG tube placement EXAM: PORTABLE ABDOMEN - 1 VIEW COMPARISON:  Previous studies including the examination none earlier today FINDINGS: There is interval placement of NG tube with its tip and side port within the stomach. Dilation of small-bowel loops in central abdomen has not changed. Gas is present in ascending and transverse colon. IMPRESSION: Tip of enteric tube is seen in the stomach. No change is seen in small bowel dilation. Electronically Signed   By: Ernie Avena M.D.   On: 07/14/2023 13:11   DG Abd 1 View  Result Date: 07/14/2023 CLINICAL DATA:  Abdominal distention EXAM: ABDOMEN - 1 VIEW COMPARISON:  Previous studies including the examination of 07/13/2023 FINDINGS: There is dilation of small-bowel loops in upper and mid abdomen. Gas is present in transverse  colon. There is no demonstrable gas in rectosigmoid. No significant changes are noted in small bowel dilation. IMPRESSION: There is dilation of small bowel loops suggesting possible ileus or partial obstruction. No significant changes are noted. Electronically Signed   By: Ernie Avena M.D.   On: 07/14/2023 13:09    Anti-infectives: Anti-infectives (From admission,  onward)    Start     Dose/Rate Route Frequency Ordered Stop   07/16/23 0745  Ampicillin-Sulbactam (UNASYN) 3 g in sodium chloride 0.9 % 100 mL IVPB        3 g 200 mL/hr over 30 Minutes Intravenous Every 8 hours 07/16/23 0649         Assessment/Plan: s/p Procedure(s): FLEXIBLE SIGMOIDOSCOPY  Patient is a 52 year old female who is admitted with alcohol withdrawal on 7/15. She is being followed for an adynamic ileus with small bowel and large bowel dilation. Patient does not endorse abdominal pain but she is a poor historian. NGT with minimal output but she recently had several Bms. KUB this morning appears unchanged and lactic acid normal. Reassuring that patient is having decreasing NGT output, and several Bms. Recommend continuation of strict electrolyte repletion, NGT, and bowel regimen.   PICC placement yesterday for initiation of TPN. Feverish since yesterday with leukocytosis, infection workup negative thus far; likely 2/2 precedex.  - Continue bowel regimen including suppository - Continue NGT to LIWS - NPO, TPN - Electrolyte repletion (Mg >2, Phos >3, K >4) - AM KUB tomorrow   LOS: 11 days    Heidi [name] 07/16/2023

## 2023-07-16 NOTE — Progress Notes (Signed)
PHARMACY NOTE:  ANTIMICROBIAL RENAL DOSAGE ADJUSTMENT  Current antimicrobial regimen includes a mismatch between antimicrobial dosage and estimated renal function.  As per policy approved by the Pharmacy & Therapeutics and Medical Executive Committees, the antimicrobial dosage will be adjusted accordingly.  Current antimicrobial dosage:  Unasyn 3 g IV q8H  Indication: Aspiration Pneumonia  Renal Function:  Estimated Creatinine Clearance: 80.8 mL/min (by C-G formula based on SCr of 0.64 mg/dL). []      On intermittent HD, scheduled: []      On CRRT    Antimicrobial dosage has been changed to:  Unasyn 3 g IV q6H  Additional comments:   Thank you for allowing pharmacy to be a part of this patient's care.  Will M. Dareen Piano, PharmD Clinical Pharmacist 07/16/2023 10:09 AM

## 2023-07-17 DIAGNOSIS — F10931 Alcohol use, unspecified with withdrawal delirium: Secondary | ICD-10-CM | POA: Diagnosis not present

## 2023-07-17 DIAGNOSIS — I10 Essential (primary) hypertension: Secondary | ICD-10-CM | POA: Diagnosis not present

## 2023-07-17 DIAGNOSIS — D696 Thrombocytopenia, unspecified: Secondary | ICD-10-CM | POA: Diagnosis not present

## 2023-07-17 DIAGNOSIS — K567 Ileus, unspecified: Secondary | ICD-10-CM | POA: Diagnosis not present

## 2023-07-17 LAB — URINALYSIS, W/ REFLEX TO CULTURE (INFECTION SUSPECTED)
Bacteria, UA: NONE SEEN
Bilirubin Urine: NEGATIVE
Glucose, UA: NEGATIVE mg/dL
Hgb urine dipstick: NEGATIVE
Ketones, ur: NEGATIVE mg/dL
Leukocytes,Ua: NEGATIVE
Nitrite: NEGATIVE
Protein, ur: NEGATIVE mg/dL
Specific Gravity, Urine: 1.01 (ref 1.005–1.030)
pH: 7 (ref 5.0–8.0)

## 2023-07-17 LAB — CBC
HCT: 26.2 % — ABNORMAL LOW (ref 36.0–46.0)
Hemoglobin: 9.1 g/dL — ABNORMAL LOW (ref 12.0–15.0)
MCH: 30.7 pg (ref 26.0–34.0)
MCHC: 34.7 g/dL (ref 30.0–36.0)
MCV: 88.5 fL (ref 80.0–100.0)
Platelets: 157 10*3/uL (ref 150–400)
RBC: 2.96 MIL/uL — ABNORMAL LOW (ref 3.87–5.11)
RDW: 15.4 % (ref 11.5–15.5)
WBC: 17.4 10*3/uL — ABNORMAL HIGH (ref 4.0–10.5)
nRBC: 0.2 % (ref 0.0–0.2)

## 2023-07-17 LAB — GLUCOSE, CAPILLARY
Glucose-Capillary: 129 mg/dL — ABNORMAL HIGH (ref 70–99)
Glucose-Capillary: 135 mg/dL — ABNORMAL HIGH (ref 70–99)
Glucose-Capillary: 118 mg/dL — ABNORMAL HIGH (ref 70–99)
Glucose-Capillary: 127 mg/dL — ABNORMAL HIGH (ref 70–99)

## 2023-07-17 MED ORDER — TRAVASOL 10 % IV SOLN
INTRAVENOUS | Status: AC
  Filled 2023-07-17: qty 921.6

## 2023-07-17 MED ORDER — CLONIDINE HCL 0.2 MG/24HR TD PTWK
0.2000 mg | MEDICATED_PATCH | TRANSDERMAL | Status: DC
  Administered 2023-07-17: 0.2 mg via TRANSDERMAL
  Filled 2023-07-17: qty 1

## 2023-07-17 MED ORDER — METOPROLOL TARTRATE 5 MG/5ML IV SOLN
7.5000 mg | Freq: Four times a day (QID) | INTRAVENOUS | Status: DC
  Administered 2023-07-17 – 2023-07-22 (×20): 7.5 mg via INTRAVENOUS
  Filled 2023-07-17 (×20): qty 10

## 2023-07-17 MED ORDER — POTASSIUM CHLORIDE 10 MEQ/100ML IV SOLN
10.0000 meq | INTRAVENOUS | Status: AC
  Administered 2023-07-17 (×6): 10 meq via INTRAVENOUS
  Filled 2023-07-17 (×6): qty 100

## 2023-07-17 MED ORDER — CLONIDINE HCL 0.2 MG/24HR TD PTWK: 0.2000 mg | MEDICATED_PATCH | TRANSDERMAL | Status: DC

## 2023-07-17 MED ORDER — METOPROLOL TARTRATE 5 MG/5ML IV SOLN
5.0000 mg | Freq: Once | INTRAVENOUS | Status: AC
  Administered 2023-07-17: 5 mg via INTRAVENOUS
  Filled 2023-07-17: qty 5

## 2023-07-17 MED ORDER — MAGNESIUM SULFATE IN D5W 1-5 GM/100ML-% IV SOLN
1.0000 g | Freq: Once | INTRAVENOUS | Status: AC
  Administered 2023-07-17: 1 g via INTRAVENOUS
  Filled 2023-07-17: qty 100

## 2023-07-17 MED ORDER — POTASSIUM CHLORIDE 10 MEQ/100ML IV SOLN
10.0000 meq | INTRAVENOUS | Status: DC
  Administered 2023-07-17 (×2): 10 meq via INTRAVENOUS
  Filled 2023-07-17 (×2): qty 100

## 2023-07-17 NOTE — Progress Notes (Signed)
PROGRESS NOTE  Heidi [name] QIH:[id number] DOB: Dec 25, 1970 DOA: 07/05/2023 PCP: Rica Records, FNP  Brief History:   52 y.o. female with medical history significant of hypertension, alcohol abuse, tobacco use disorder who presents to the emergency department from home via EMS due to complaints of generalized bodyaches, nausea, vomiting which she thinks was related to alcohol withdrawal. Patient usually drinks 4 40 ounce beers daily, but she only had 2 12 ounce beers today.  Patient complained of pain in chest, legs, arms and endorsed having had some falls without any injury.  Her hospitalization has been prolonged secondary to delirium and agitation presumably from alcohol withdrawal.  She has been on Precedex since 07/07/2023.  Attempts at weaning had resulted in increased agitation.  Unfortunately, her hospitalization has been prolonged secondary to development of ileus.  NG was inserted to low intermittent suction on 07/14/2023.  PICC line placed on 07/15/23 and TPN was initiated due to prolonged period of no oral intake and prolonged ileus.   On 07/16/23, pt began showing signs of return of bowel function with 3 BMs in last 24 hours.  She developed a fever up to 102.2.  UA was negative.  Blood cultures were drawn and pt started on abx empirically.  On 07/17/2023 in the late morning, the patient's Precedex was weaned off.  The patient remained fairly lucid and redirectable. She had one BM in last 24 hours.  TPN continues   Assessment/Plan:  Acute metabolic encephalopathy/delirium tremens -Patient has been on Precedex and 07/07/2023 -Attempts at weaning has resulted in increased agitation -Unfortunately, the patient's ileus has limited oral medications to help mitigate her agitation -Continue IV Precedex for now>>wean -Librium was discontinued -received high-dose thiamine  -07/14/23 Repeat UA 0-5WBC -B12--1649 -Ammonia--25 -Folic acid--8.7 -TSH--2.866 -UDS positive for  benzodiazepine -07/14/23--add clonidine TTS-1 -continue to wean precedex>>down to 0.7 mcg/kg/min 7/26 -07/17/23--weaned off precedex around noon   Abdominal ileus -07/12/2023 CT AP--multiple dilated fluid-filled loops of SB and colon -Appreciate GI>>possible neostigmine -07/13/2023 flex sig--no obstruction noted--erythematous mucosa in the transverse colon with dilated in the descending, transverse: -07/14/2023--NG inserted to low intermittent suction -Appreciate general surgery and GI assistance -Remain n.p.o. -NG output remains--about 200-400 output each shift -passing flatus, had 1 watery BM last 24 hours -continue LIS -optimize electrolytes   Fever -developed fever 102.2 in am 7/26 -blood cultures--neg to date -UA neg for pyuria -7/25 CXR personally reviewed--increased interstitial markings -KUB--personally reviewed--persistent bowel dilatation, unchanged -PCT 0.11 -lactic acid 0.8 -start empiric unasyn -suspect aspiration pneumonitis   FEN -Replace potassium>>give additional IV KCl -7/25 PICC line for TPN initiation   Essential hypertension -Irbesartan and diltiazem were initially discontinued secondary to low blood pressure -Patient is now hypertensive again -clonidine TTS-1 started 7/24 -continue IV lopressor   Thrombocytopenia -Secondary to myelosuppression from alcohol use   Transaminasemia -due to Etoh -improved   Tobacco cessation -NicoDerm patch -Cessation discussed   Hypokalemia -Replete -Check magnesium 1.9                 Family Communication:   Family at bedside updated 7/27   Consultants:  PCCM   Code Status:  FULL    DVT Prophylaxis:  SCDs     Procedures: As Listed in Progress Note Above   Antibiotics: Unasyn 7/26>>       Subjective: Pt complains of some abd tightness.  Denies f/c, cp, sob, n/v  Objective: Vitals:   07/17/23 1100 07/17/23 1126 07/17/23  1342 07/17/23 1343  BP: 119/72  (!) 177/98   Pulse: 97  (!) 139 (!)  140  Resp: (!) 26  (!) 21 19  Temp:  98.1 F (36.7 C)    TempSrc:  Axillary    SpO2: 100%  100% 100%  Weight:      Height:        Intake/Output Summary (Last 24 hours) at 07/17/2023 1358 Last data filed at 07/17/2023 1343 Gross per 24 hour  Intake 3088.41 ml  Output 2600 ml  Net 488.41 ml   Weight change: -1.8 kg Exam:  General:  Pt is alert, follows commands appropriately, not in acute distress HEENT: No icterus, No thrush, No neck mass, [place]/AT Cardiovascular: RRR, S1/S2, no rubs, no gallops Respiratory: CTA bilaterally, no wheezing, no crackles, no rhonchi Abdomen: Soft/+BS, mild diffuse tender, mildly distended, no guarding Extremities: No edema, No lymphangitis, No petechiae, No rashes, no synovitis Neuro:  CN II-XII intact, strength 4/5 in RUE, RLE, strength 4/5 LUE, LLE; sensation intact bilateral; no dysmetria; babinski equivocal    Data Reviewed: I have personally reviewed following labs and imaging studies Basic Metabolic Panel: Recent Labs  Lab 07/11/23 0426 07/12/23 0327 07/13/23 0532 07/14/23 0456 07/15/23 0600 07/16/23 0604 07/17/23 0325  NA 136   < > 138 142 145 141 136  K 3.1*   < > 3.4* 3.4* 3.4* 3.5 3.0*  CL 111   < > 107 112* 115* 114* 108  CO2 19*   < > 21* 22 20* 19* 22  GLUCOSE 115*   < > 127* 136* 85 136* 134*  BUN <5*   < > 9 13 14 10 7   CREATININE 0.45   < > 0.59 0.47 0.74 0.64 0.45  CALCIUM 9.2   < > 9.7 9.1 9.1 9.0 9.0  MG 1.7  --  1.9  --  2.0 2.1 1.9  PHOS  --   --   --   --  2.9 3.0 3.6   < > = values in this interval not displayed.   Liver Function Tests: Recent Labs  Lab 07/11/23 0426 07/14/23 0456 07/15/23 0600 07/16/23 0604  AST 22 21 29 28   ALT 27 19 19 16   ALKPHOS 63 53 75 80  BILITOT 0.9 0.8 1.0 0.7  PROT 6.7 6.0* 6.4* 6.3*  ALBUMIN 3.2* 2.8* 2.9* 2.8*   Recent Labs  Lab 07/12/23 0327  LIPASE 41   Recent Labs  Lab 07/15/23 0600  AMMONIA 25   Coagulation Profile: No results for input(s): "INR", "PROTIME" in  the last 168 hours. CBC: Recent Labs  Lab 07/12/23 0327 07/13/23 0532 07/15/23 0600 07/16/23 0604 07/17/23 1135  WBC 2.2* 7.0 15.8* 15.6* 17.4*  HGB 12.7 12.4 10.7* 10.6* 9.1*  HCT 39.0 37.9 33.4* 32.7* 26.2*  MCV 92.9 92.4 93.0 92.9 88.5  PLT 112* 158 215 174 157   Cardiac Enzymes: No results for input(s): "CKTOTAL", "CKMB", "CKMBINDEX", "TROPONINI" in the last 168 hours. BNP: Invalid input(s): "POCBNP" CBG: Recent Labs  Lab 07/16/23 1116 07/16/23 1604 07/16/23 2357 07/17/23 0634 07/17/23 1121  GLUCAP 124* 80 129* 129* 135*   HbA1C: No results for input(s): "HGBA1C" in the last 72 hours. Urine analysis:    Component Value Date/Time   COLORURINE AMBER (A) 07/14/2023 1453   APPEARANCEUR HAZY (A) 07/14/2023 1453   LABSPEC 1.030 07/14/2023 1453   PHURINE 5.0 07/14/2023 1453   GLUCOSEU NEGATIVE 07/14/2023 1453   HGBUR NEGATIVE 07/14/2023 1453   BILIRUBINUR NEGATIVE 07/14/2023 1453  KETONESUR NEGATIVE 07/14/2023 1453   PROTEINUR 30 (A) 07/14/2023 1453   UROBILINOGEN 0.2 01/04/2012 1330   NITRITE NEGATIVE 07/14/2023 1453   LEUKOCYTESUR NEGATIVE 07/14/2023 1453   Sepsis Labs: @LABRCNTIP (procalcitonin:4,lacticidven:4) ) Recent Results (from the past 240 hour(s))  Culture, blood (Routine X 2) w Reflex to ID Panel     Status: None   Collection Time: 07/09/23  9:09 AM   Specimen: BLOOD  Result Value Ref Range Status   Specimen Description BLOOD LEFT ASSIST CONTROL  Final   Special Requests   Final    BOTTLES DRAWN AEROBIC AND ANAEROBIC Blood Culture adequate volume   Culture   Final    NO GROWTH 6 DAYS Performed at [facility], [address] [place], [place] [address]    Report Status 07/15/2023 FINAL  Final  Culture, blood (Routine X 2) w Reflex to ID Panel     Status: None   Collection Time: 07/09/23  9:09 AM   Specimen: BLOOD  Result Value Ref Range Status   Specimen Description BLOOD LEFT HAND  Final   Special Requests   Final    BOTTLES DRAWN AEROBIC  AND ANAEROBIC Blood Culture adequate volume   Culture   Final    NO GROWTH 6 DAYS Performed at [facility], [address] [place], [place] [address]    Report Status 07/15/2023 FINAL  Final  Culture, blood (Routine X 2) w Reflex to ID Panel     Status: None (Preliminary result)   Collection Time: 07/15/23 10:08 AM   Specimen: BLOOD RIGHT HAND  Result Value Ref Range Status   Specimen Description BLOOD RIGHT HAND  Final   Special Requests   Final    BOTTLES DRAWN AEROBIC AND ANAEROBIC Blood Culture adequate volume   Culture   Final    NO GROWTH 2 DAYS Performed at [facility], [address], [place], [place] [address]    Report Status PENDING  Incomplete  Culture, blood (Routine X 2) w Reflex to ID Panel     Status: None (Preliminary result)   Collection Time: 07/15/23 10:08 AM   Specimen: BLOOD  Result Value Ref Range Status   Specimen Description BLOOD BLOOD LEFT HAND  Final   Special Requests   Final    BOTTLES DRAWN AEROBIC AND ANAEROBIC Blood Culture adequate volume   Culture   Final    NO GROWTH 2 DAYS Performed at [facility], [address] [place], [place] [address]    Report Status PENDING  Incomplete     Scheduled Meds:  bisacodyl  10 mg Rectal Daily   Chlorhexidine Gluconate Cloth  6 each Topical Daily   cloNIDine  0.1 mg Transdermal Weekly   folic acid  1 mg Intravenous Daily   insulin aspart  0-15 Units Subcutaneous Q6H   metoprolol tartrate  5 mg Intravenous Q6H   nicotine  14 mg Transdermal Daily   polyethylene glycol  17 g Per Tube Daily   senna-docusate  1 tablet Per Tube BID   sodium chloride flush  10-40 mL Intracatheter Q12H   thiamine (VITAMIN B1) injection  100 mg Intravenous Q24H   Continuous Infusions:  ampicillin-sulbactam (UNASYN) IV Stopped (07/17/23 0951)   dexmedetomidine (PRECEDEX) IV infusion Stopped (07/17/23 1136)   potassium chloride 10 mEq (07/17/23 1352)   TPN ADULT (ION) 80 mL/hr at 07/17/23 1343   TPN ADULT (ION)       Procedures/Studies: DG Abd 1 View  Result Date: 07/16/2023 CLINICAL DATA:  Ileus, abdominal  distention EXAM: ABDOMEN - 1 VIEW COMPARISON:  Previous studies including the examination of 07/15/2023 FINDINGS: There are dilated small bowel loops measuring up to 5.4 cm in diameter. Small-bowel dilation appears slightly more prominent. Mucosal folds in the small bowel loops appear prominent. Gas is present in right colon. Tip of NG tube is seen in the stomach. IMPRESSION: There is dilation of small-bowel loops along with mucosal thickening. Findings may suggest ileus or enteritis or distal colonic obstruction. Electronically Signed   By: Ernie Avena M.D.   On: 07/16/2023 11:41   DG Abd 1 View  Result Date: 07/15/2023 CLINICAL DATA:  [id number] Ileus [facility]) [id number] EXAM: ABDOMEN - 1 VIEW COMPARISON:  07/14/2023 FINDINGS: Enteric tube remains positioned within the stomach. Persistently dilated loops of small bowel within the abdomen, similar to prior. No gross free intraperitoneal air on supine view. IMPRESSION: Persistently dilated loops of small bowel within the abdomen, ileus versus obstruction. Electronically Signed   By: Duanne Guess D.O.   On: 07/15/2023 13:17   DG CHEST PORT 1 VIEW  Result Date: 07/15/2023 CLINICAL DATA:  Fever EXAM: PORTABLE CHEST 1 VIEW COMPARISON:  07/09/2023 FINDINGS: New enteric tube which extends into the stomach. The heart size and mediastinal contours are within normal limits. Both lungs are clear. The visualized skeletal structures are unremarkable. Gaseous distension of bowel within the imaged upper abdomen. IMPRESSION: 1. No active cardiopulmonary disease. 2. New enteric tube which extends into the stomach. Electronically Signed   By: Duanne Guess D.O.   On: 07/15/2023 13:15   [place] EKG SITE RITE  Result Date: 07/14/2023 If Site Rite image not attached, placement could not be confirmed due to current cardiac rhythm.  DG Abd Portable 1V  Result Date:  07/14/2023 CLINICAL DATA:  NG tube placement EXAM: PORTABLE ABDOMEN - 1 VIEW COMPARISON:  Previous studies including the examination none earlier today FINDINGS: There is interval placement of NG tube with its tip and side port within the stomach. Dilation of small-bowel loops in central abdomen has not changed. Gas is present in ascending and transverse colon. IMPRESSION: Tip of enteric tube is seen in the stomach. No change is seen in small bowel dilation. Electronically Signed   By: Ernie Avena M.D.   On: 07/14/2023 13:11   DG Abd 1 View  Result Date: 07/14/2023 CLINICAL DATA:  Abdominal distention EXAM: ABDOMEN - 1 VIEW COMPARISON:  Previous studies including the examination of 07/13/2023 FINDINGS: There is dilation of small-bowel loops in upper and mid abdomen. Gas is present in transverse colon. There is no demonstrable gas in rectosigmoid. No significant changes are noted in small bowel dilation. IMPRESSION: There is dilation of small bowel loops suggesting possible ileus or partial obstruction. No significant changes are noted. Electronically Signed   By: Ernie Avena M.D.   On: 07/14/2023 13:09   DG Abd 1 View  Result Date: 07/13/2023 CLINICAL DATA:  Follow-up ileus, initial encounter EXAM: ABDOMEN - 1 VIEW COMPARISON:  None Available. FINDINGS: Scattered large and small bowel gas is noted. Multiple dilated loops of small bowel are again noted similar to that seen on the prior exam. No free air is seen. No mass lesion is noted. No bony abnormality is seen. IMPRESSION: Stable small bowel dilatation centrally. Electronically Signed   By: Alcide Clever M.D.   On: 07/13/2023 20:09   DG Abd 1 View  Result Date: 07/13/2023 CLINICAL DATA:  Abdominal distension. EXAM: ABDOMEN - 1 VIEW COMPARISON:  07/12/2023 FINDINGS: Persistent gaseous distension of  bowel loops throughout the abdomen and pelvis. The degree of bowel distension has minimally changed. Upper abdomen is incompletely imaged and  not clear if nasogastric tube is still present. Cannot evaluate for free air on the supine image. IMPRESSION: Persistent gaseous distension of small and large bowel loops. Based on the previous CT findings, a distal colonic obstruction cannot be excluded. Bowel gas distension has minimally changed. Electronically Signed   By: Richarda Overlie M.D.   On: 07/13/2023 16:33   DG Abd 1 View  Result Date: 07/12/2023 CLINICAL DATA:  Nasogastric tube placement. EXAM: ABDOMEN - 1 VIEW COMPARISON:  Radiograph and CT earlier today FINDINGS: Tip and side port of the enteric tube below the diaphragm in the stomach. Gaseous bowel distension persists throughout the abdomen. IMPRESSION: Tip and side port of the enteric tube below the diaphragm in the stomach. Electronically Signed   By: Narda Rutherford M.D.   On: 07/12/2023 17:06   CT ABDOMEN PELVIS WO CONTRAST  Result Date: 07/12/2023 CLINICAL DATA:  Abdominal pain, acute. EXAM: CT ABDOMEN AND PELVIS WITHOUT CONTRAST TECHNIQUE: Multidetector CT imaging of the abdomen and pelvis was performed following the standard protocol without IV contrast. RADIATION DOSE REDUCTION: This exam was performed according to the departmental dose-optimization program which includes automated exposure control, adjustment of the mA and/or kV according to patient size and/or use of iterative reconstruction technique. COMPARISON:  Abdominal radiographs 07/12/2023. Abdominal ultrasound 07/06/2023. FINDINGS: Lower chest: Trace bilateral pleural effusions with associated dependent atelectasis at both lung bases. No consolidation or significant pericardial fluid. There is moderate distal esophageal wall thickening which is nonspecific. Hepatobiliary: No suspicious hepatic findings are identified on noncontrast imaging. In the dome of the left hepatic lobe, there is a 9 mm low-density lesion which is likely a cyst. No evidence of gallstones, gallbladder wall thickening or biliary dilatation. Pancreas:  Unremarkable. No pancreatic ductal dilatation or surrounding inflammatory changes. Spleen: Normal in size without focal abnormality. Adrenals/Urinary Tract: Both adrenal glands appear normal. No evidence of urinary tract calculus, suspicious renal lesion or hydronephrosis. Mild nonspecific perinephric soft tissue stranding bilaterally. The bladder appears unremarkable for its degree of distention. Stomach/Bowel: There may be a minimal amount of enteric contrast dependently in the proximal stomach. The stomach and proximal small bowel are not significantly distended. However, as seen on earlier radiographs, there are multiple dilated and fluid-filled loops of mid to distal small bowel and colon. The colon is dilated into the mid descending colon. The sigmoid colon and rectum are relatively decompressed, although did no focal transition point or obvious colonic mass is identified on this noncontrast study. There is no bowel wall thickening or pneumatosis. There is some high density material within the lumen of the appendix, and the appendix appears mildly distended, although demonstrates no wall thickening or focal surrounding inflammation. A rectal tube is in place. Vascular/Lymphatic: There are no enlarged abdominal or pelvic lymph nodes. Aortic and branch vessel atherosclerosis without evidence of aneurysm. Vascular patency not addressed without contrast. Reproductive: The uterus and ovaries appear normal. No adnexal mass. Other: Moderate amount of low-density ascites. There is soft tissue stranding throughout the mesenteric fat. Possible peritoneal nodularity in the right mid abdomen measuring up to 2.3 x 1.2 cm on image 44/2. No pneumoperitoneum or abdominal wall hernia identified. Musculoskeletal: No acute or significant osseous findings. Unless specific follow-up recommendations are mentioned in the findings or impression sections, no imaging follow-up of any mentioned incidental findings is recommended.  IMPRESSION: 1. Multiple dilated and fluid-filled loops  of mid to distal small bowel and colon with relative decompression of the sigmoid colon and rectum. No focal transition point or obvious colonic mass is identified on this study which is limited by the lack of intravenous and enteric contrast. Findings could be secondary to an ileus from underlying peritonitis, although are concerning for possible distal colonic obstruction. Recommend further evaluation with colonoscopy. 2. Moderate amount of low-density ascites with soft tissue stranding throughout the mesenteric fat and possible peritoneal nodularity in the right mid abdomen. No evidence of bowel perforation. Cannot exclude peritoneal carcinomatosis. 3. Mildly prominent appendix without evidence of acute inflammation. 4. Trace bilateral pleural effusions with associated dependent atelectasis at both lung bases. 5. Distal esophageal wall thickening, nonspecific, but potentially related to esophagitis. 6.  Aortic Atherosclerosis (ICD10-I70.0). Electronically Signed   By: Carey Bullocks M.D.   On: 07/12/2023 13:24   DG Abd 1 View  Result Date: 07/12/2023 CLINICAL DATA:  Abdominal pain EXAM: ABDOMEN - 1 VIEW COMPARISON:  None Available. FINDINGS: Significantly dilated bowel loops. No definite free air on this limited supine radiograph. No abnormal calcifications identified although bowel loops limited assessment. Lower lumbar degenerative change. IMPRESSION: Significantly dilated bowel loops which could represent obstruction or ileus. Consider CT of the abdomen/pelvis for further evaluation. Electronically Signed   By: Feliberto Harts M.D.   On: 07/12/2023 08:31   DG CHEST PORT 1 VIEW  Result Date: 07/09/2023 CLINICAL DATA:  Cough, alcohol withdrawal. EXAM: PORTABLE CHEST 1 VIEW COMPARISON:  Chest radiograph 04/08/2023 FINDINGS: The cardiomediastinal silhouette is normal There is no focal consolidation or pulmonary edema. There is no pleural effusion or  pneumothorax There is no acute osseous abnormality. IMPRESSION: No radiographic evidence of acute cardiopulmonary process. Electronically Signed   By: Lesia Hausen M.D.   On: 07/09/2023 12:12   US Abdomen Limited RUQ (LIVER/GB)  Result Date: 07/06/2023 CLINICAL DATA:  Transaminitis EXAM: ULTRASOUND ABDOMEN LIMITED RIGHT UPPER QUADRANT COMPARISON:  None Available. FINDINGS: Gallbladder: No gallstones or wall thickening visualized. No sonographic Murphy sign noted by sonographer. Common bile duct: Diameter: 0.3 cm Liver: No focal lesion identified. Increased parenchymal echogenicity. Portal vein is patent on color Doppler imaging with normal direction of blood flow towards the liver. Other: None. IMPRESSION: 1. Hepatic steatosis. 2. Otherwise unremarkable right upper quadrant ultrasound. Electronically Signed   By: Jearld Lesch M.D.   On: 07/06/2023 14:47    Catarina Hartshorn, DO  [facility]  If 7PM-7AM, please contact night-coverage [email or web address] Password [email or web address] 07/17/2023, 1:58 PM   LOS: 12 days

## 2023-07-17 NOTE — Consult Note (Signed)
PHARMACY - TOTAL PARENTERAL NUTRITION CONSULT NOTE   Indication: Prolonged ileus  Patient Measurements: Height: 5\' 3"  (160 cm) Weight: 70.2 kg (154 lb 12.2 oz) IBW/kg (Calculated) : 52.4 TPN AdjBW (KG): 55.7 Body mass index is 27.42 kg/m. Usual Weight: 67.2 kg  Assessment:   Glucose / Insulin: BG 80-134 (past 24 hours) 6 units given total Electrolytes: K+ 3.0 Na 136 CO2 19 Renal: WNL Hepatic: Albumin 2.8 Intake / Output; MIVF: UOP 0.3 mL/kg/hr; +5178 mL GI Imaging: 7/24 Abdominal US: Dilation of small-bowel loops in upper and mid abdomen. Gas is present in transverse colon. GI Surgeries / Procedures: 7/23 Flex sigmoidoscopy: Erythematous mucosa in transversa colon. No concerns for necrosis. Dilation in descending and transverse colon. Fluid aspiration performed.  Central access: PICC 7/25 TPN start date: 7/25  Nutritional Goals: Goal TPN rate is 80 mL/hr (provides 92 g of protein and 1731 kcals per day)  RD Assessment: Estimated Needs Total Energy Estimated Needs: 1700-1900 Total Protein Estimated Needs: 85-95 grams Total Fluid Estimated Needs: 1.7-1.9 L/day  Current Nutrition:  NPO  Plan:  Potassium chloride 10 mEq x 6 dose Magnesium sulfate 1 gram x 1 dose  Continue TPN at 80 mL/hr   Electrolytes in TPN: Na 21mEq/L, K 82mEq/L, Ca 19mEq/L, Mg 35mEq/L, and Phos 78mmol/L. Cl:Ac 1:2 Add standard MVI and trace elements to TPN Continue moderate q6h SSI and adjust as needed  Monitor TPN labs on Mon/Thurs  Judeth Cornfield, PharmD Clinical Pharmacist 07/17/2023 8:04 AM

## 2023-07-17 NOTE — Plan of Care (Signed)
  Problem: Activity: Goal: Risk for activity intolerance will decrease Outcome: Progressing   Problem: Nutrition: Goal: Adequate nutrition will be maintained Outcome: Progressing   Problem: Coping: Goal: Level of anxiety will decrease Outcome: Progressing   Problem: Safety: Goal: Ability to remain free from injury will improve Outcome: Progressing   Problem: Skin Integrity: Goal: Risk for impaired skin integrity will decrease Outcome: Progressing   Problem: Safety: Goal: Non-violent Restraint(s) Outcome: Progressing

## 2023-07-17 NOTE — Plan of Care (Signed)
  Problem: Education: Goal: Knowledge of General Education information will improve Description: Including pain rating scale, medication(s)/side effects and non-pharmacologic comfort measures Outcome: Progressing   Problem: Clinical Measurements: Goal: Ability to maintain clinical measurements within normal limits will improve Outcome: Progressing Goal: Will remain free from infection Outcome: Progressing Goal: Diagnostic test results will improve Outcome: Progressing Goal: Respiratory complications will improve Outcome: Progressing Goal: Cardiovascular complication will be avoided Outcome: Progressing   Problem: Elimination: Goal: Will not experience complications related to bowel motility Outcome: Progressing Goal: Will not experience complications related to urinary retention Outcome: Progressing   Problem: Pain Managment: Goal: General experience of comfort will improve Outcome: Progressing   Problem: Safety: Goal: Ability to remain free from injury will improve Outcome: Progressing   Problem: Skin Integrity: Goal: Risk for impaired skin integrity will decrease Outcome: Progressing   Problem: Health Behavior/Discharge Planning: Goal: Ability to manage health-related needs will improve Outcome: Not Progressing   Problem: Activity: Goal: Risk for activity intolerance will decrease Outcome: Not Progressing   Problem: Nutrition: Goal: Adequate nutrition will be maintained Outcome: Not Progressing   Problem: Coping: Goal: Level of anxiety will decrease Outcome: Not Progressing   Problem: Safety: Goal: Non-violent Restraint(s) Outcome: Not Progressing

## 2023-07-17 NOTE — Progress Notes (Signed)
Pt. Awake and more alert. Pt. Up to chair with 2 person assist. Pt. Weak to stand and walk.   Around 1:00pm: Pt. Walked from room into hallway approximately 50 ft. Pt. Became very weak and tired, staff had to assist pt. to sit in chair. Pt. Then wanted to use [facility], pt. Assisted to [facility] with 3 staff member assist to have a BM. MD notified of BM.   Around 2:45pm: Pt. Called out wanting to use [facility]. Pt. Required 3 staff to assist to [facility] to have a BM. PT and OT ordered.

## 2023-07-18 ENCOUNTER — Inpatient Hospital Stay (HOSPITAL_COMMUNITY): Payer: Medicaid Other

## 2023-07-18 DIAGNOSIS — F10939 Alcohol use, unspecified with withdrawal, unspecified: Secondary | ICD-10-CM | POA: Diagnosis not present

## 2023-07-18 DIAGNOSIS — F10931 Alcohol use, unspecified with withdrawal delirium: Secondary | ICD-10-CM | POA: Diagnosis not present

## 2023-07-18 DIAGNOSIS — D696 Thrombocytopenia, unspecified: Secondary | ICD-10-CM | POA: Diagnosis not present

## 2023-07-18 DIAGNOSIS — R112 Nausea with vomiting, unspecified: Secondary | ICD-10-CM | POA: Diagnosis not present

## 2023-07-18 DIAGNOSIS — K567 Ileus, unspecified: Secondary | ICD-10-CM | POA: Diagnosis not present

## 2023-07-18 LAB — CULTURE, BLOOD (ROUTINE X 2)

## 2023-07-18 LAB — COMPREHENSIVE METABOLIC PANEL WITH GFR
ALT: 17 U/L (ref 0–44)
AST: 27 U/L (ref 15–41)
Albumin: 2.6 g/dL — ABNORMAL LOW (ref 3.5–5.0)
Alkaline Phosphatase: 76 U/L (ref 38–126)
Anion gap: 3 — ABNORMAL LOW (ref 5–15)
BUN: 7 mg/dL (ref 6–20)
CO2: 25 mmol/L (ref 22–32)
Calcium: 9 mg/dL (ref 8.9–10.3)
Chloride: 108 mmol/L (ref 98–111)
Creatinine, Ser: 0.49 mg/dL (ref 0.44–1.00)
GFR, Estimated: >60 mL/min (ref >60)
Glucose, Bld: 155 mg/dL — ABNORMAL HIGH (ref 70–99)
Potassium: 3.6 mmol/L (ref 3.5–5.1)
Sodium: 136 mmol/L (ref 135–145)
Total Bilirubin: 0.6 mg/dL (ref 0.3–1.2)
Total Protein: 6.2 g/dL — ABNORMAL LOW (ref 6.5–8.1)

## 2023-07-18 LAB — MAGNESIUM: Magnesium: 1.9 mg/dL (ref 1.7–2.4)

## 2023-07-18 LAB — GLUCOSE, CAPILLARY
Glucose-Capillary: 134 mg/dL — ABNORMAL HIGH (ref 70–99)
Glucose-Capillary: 143 mg/dL — ABNORMAL HIGH (ref 70–99)
Glucose-Capillary: 150 mg/dL — ABNORMAL HIGH (ref 70–99)
Glucose-Capillary: 151 mg/dL — ABNORMAL HIGH (ref 70–99)

## 2023-07-18 LAB — CBC
HCT: 25.4 % — ABNORMAL LOW (ref 36.0–46.0)
Hemoglobin: 8.5 g/dL — ABNORMAL LOW (ref 12.0–15.0)
MCH: 30 pg (ref 26.0–34.0)
MCHC: 33.5 g/dL (ref 30.0–36.0)
MCV: 89.8 fL (ref 80.0–100.0)
Platelets: 173 10*3/uL (ref 150–400)
RBC: 2.83 MIL/uL — ABNORMAL LOW (ref 3.87–5.11)
RDW: 15.8 % — ABNORMAL HIGH (ref 11.5–15.5)
WBC: 17.2 10*3/uL — ABNORMAL HIGH (ref 4.0–10.5)
nRBC: 0.2 % (ref 0.0–0.2)

## 2023-07-18 LAB — PHOSPHORUS: Phosphorus: 4.5 mg/dL (ref 2.5–4.6)

## 2023-07-18 MED ORDER — TRAMADOL HCL 50 MG PO TABS
50.0000 mg | ORAL_TABLET | Freq: Four times a day (QID) | ORAL | Status: DC | PRN
  Administered 2023-07-23 – 2023-07-24 (×3): 50 mg via ORAL
  Filled 2023-07-18 (×3): qty 1

## 2023-07-18 MED ORDER — OXYCODONE HCL 5 MG PO TABS
5.0000 mg | ORAL_TABLET | Freq: Four times a day (QID) | ORAL | Status: DC | PRN
  Administered 2023-07-18 – 2023-07-23 (×12): 5 mg via ORAL
  Filled 2023-07-18 (×12): qty 1

## 2023-07-18 MED ORDER — LORAZEPAM 2 MG/ML IJ SOLN
1.0000 mg | INTRAMUSCULAR | Status: DC | PRN
  Administered 2023-07-18 – 2023-07-19 (×4): 2 mg via INTRAVENOUS
  Filled 2023-07-18 (×4): qty 1

## 2023-07-18 MED ORDER — SENNOSIDES-DOCUSATE SODIUM 8.6-50 MG PO TABS
1.0000 | ORAL_TABLET | Freq: Two times a day (BID) | ORAL | Status: DC
  Administered 2023-07-18 – 2023-07-24 (×10): 1 via ORAL
  Filled 2023-07-18 (×12): qty 1

## 2023-07-18 MED ORDER — IOHEXOL 300 MG/ML  SOLN
100.0000 mL | Freq: Once | INTRAMUSCULAR | Status: AC | PRN
  Administered 2023-07-18: 100 mL via INTRAVENOUS

## 2023-07-18 MED ORDER — DILTIAZEM LOAD VIA INFUSION
10.0000 mg | Freq: Once | INTRAVENOUS | Status: AC
  Administered 2023-07-18: 10 mg via INTRAVENOUS
  Filled 2023-07-18: qty 10

## 2023-07-18 MED ORDER — PIPERACILLIN-TAZOBACTAM 3.375 G IVPB
3.3750 g | Freq: Three times a day (TID) | INTRAVENOUS | Status: DC
  Administered 2023-07-18 – 2023-07-19 (×2): 3.375 g via INTRAVENOUS
  Filled 2023-07-18 (×3): qty 50

## 2023-07-18 MED ORDER — VANCOMYCIN HCL 1500 MG/300ML IV SOLN
1500.0000 mg | INTRAVENOUS | Status: AC
  Administered 2023-07-18: 1500 mg via INTRAVENOUS
  Filled 2023-07-18: qty 300

## 2023-07-18 MED ORDER — MAGNESIUM SULFATE IN D5W 1-5 GM/100ML-% IV SOLN
1.0000 g | Freq: Once | INTRAVENOUS | Status: AC
  Administered 2023-07-18: 1 g via INTRAVENOUS
  Filled 2023-07-18: qty 100

## 2023-07-18 MED ORDER — IOHEXOL 9 MG/ML PO SOLN
500.0000 mL | ORAL | Status: AC
  Administered 2023-07-18 (×2): 500 mL via ORAL

## 2023-07-18 MED ORDER — ACETAMINOPHEN 650 MG RE SUPP
650.0000 mg | Freq: Four times a day (QID) | RECTAL | Status: DC | PRN
  Administered 2023-07-19: 650 mg via RECTAL
  Filled 2023-07-18: qty 1

## 2023-07-18 MED ORDER — POTASSIUM CHLORIDE 10 MEQ/100ML IV SOLN
10.0000 meq | INTRAVENOUS | Status: DC
  Filled 2023-07-18: qty 100

## 2023-07-18 MED ORDER — CLONIDINE HCL 0.1 MG PO TABS: 0.1000 mg | ORAL_TABLET | Freq: Three times a day (TID) | ORAL | Status: DC | PRN

## 2023-07-18 MED ORDER — LORAZEPAM 1 MG PO TABS
2.0000 mg | ORAL_TABLET | Freq: Once | ORAL | Status: AC | PRN
  Administered 2023-07-18: 2 mg
  Filled 2023-07-18: qty 2

## 2023-07-18 MED ORDER — POLYETHYLENE GLYCOL 3350 17 G PO PACK
17.0000 g | PACK | Freq: Every day | ORAL | Status: DC
  Administered 2023-07-20 – 2023-07-23 (×3): 17 g via ORAL
  Filled 2023-07-18 (×6): qty 1

## 2023-07-18 MED ORDER — POTASSIUM CHLORIDE 10 MEQ/100ML IV SOLN
10.0000 meq | INTRAVENOUS | Status: AC
  Administered 2023-07-18 (×3): 10 meq via INTRAVENOUS
  Filled 2023-07-18 (×2): qty 100

## 2023-07-18 MED ORDER — LORAZEPAM 1 MG PO TABS
1.0000 mg | ORAL_TABLET | ORAL | Status: DC | PRN
  Administered 2023-07-18: 2 mg via ORAL
  Filled 2023-07-18: qty 2

## 2023-07-18 MED ORDER — VANCOMYCIN HCL 1500 MG/300ML IV SOLN
1500.0000 mg | INTRAVENOUS | Status: DC
  Administered 2023-07-19 – 2023-07-20 (×2): 1500 mg via INTRAVENOUS
  Filled 2023-07-18 (×2): qty 300

## 2023-07-18 MED ORDER — LORAZEPAM 2 MG/ML IJ SOLN
2.0000 mg | Freq: Once | INTRAMUSCULAR | Status: AC | PRN
  Administered 2023-07-18: 2 mg via INTRAVENOUS
  Filled 2023-07-18: qty 1

## 2023-07-18 MED ORDER — ONDANSETRON HCL 4 MG PO TABS: 4.0000 mg | ORAL_TABLET | Freq: Four times a day (QID) | ORAL | Status: DC | PRN

## 2023-07-18 MED ORDER — LACTATED RINGERS IV BOLUS
1000.0000 mL | Freq: Once | INTRAVENOUS | Status: AC
  Administered 2023-07-18: 1000 mL via INTRAVENOUS

## 2023-07-18 MED ORDER — ONDANSETRON HCL 4 MG/2ML IJ SOLN
4.0000 mg | Freq: Four times a day (QID) | INTRAMUSCULAR | Status: DC | PRN
  Administered 2023-07-19 – 2023-07-22 (×5): 4 mg via INTRAVENOUS
  Filled 2023-07-18 (×6): qty 2

## 2023-07-18 MED ORDER — TRAVASOL 10 % IV SOLN
INTRAVENOUS | Status: AC
  Filled 2023-07-18: qty 921.6

## 2023-07-18 MED ORDER — ACETAMINOPHEN 325 MG PO TABS
650.0000 mg | ORAL_TABLET | Freq: Four times a day (QID) | ORAL | Status: DC | PRN
  Administered 2023-07-20 – 2023-07-24 (×6): 650 mg via ORAL
  Filled 2023-07-18 (×7): qty 2

## 2023-07-18 MED ORDER — DILTIAZEM HCL-DEXTROSE 125-5 MG/125ML-% IV SOLN (PREMIX)
5.0000 mg/h | INTRAVENOUS | Status: DC
  Administered 2023-07-18: 5 mg/h via INTRAVENOUS
  Administered 2023-07-18 – 2023-07-21 (×5): 15 mg/h via INTRAVENOUS
  Administered 2023-07-21: 10 mg/h via INTRAVENOUS
  Administered 2023-07-21: 15 mg/h via INTRAVENOUS
  Filled 2023-07-18 (×10): qty 125

## 2023-07-18 NOTE — Progress Notes (Signed)
Gastroenterology & Hepatology   Interval History: Patient is seen in ICU.  Appears more awake trying to get out of bed.  Is tachycardic and diaphoretic  Inpatient Medications:  Current Facility-Administered Medications:    acetaminophen (TYLENOL) tablet 650 mg, 650 mg, Per Tube, Q6H PRN, 650 mg at 07/17/23 2111 **OR** acetaminophen (TYLENOL) suppository 650 mg, 650 mg, Rectal, Q6H PRN, Tat, David, MD   Ampicillin-Sulbactam (UNASYN) 3 g in sodium chloride 0.9 % 100 mL IVPB, 3 g, Intravenous, Q6H, Tat, Onalee Hua, MD, Last Rate: 200 mL/hr at 07/17/23 2325, 3 g at 07/17/23 2325   bisacodyl (DULCOLAX) suppository 10 mg, 10 mg, Rectal, Daily, Pappayliou, Catherine A, DO, 10 mg at 07/17/23 0844   Chlorhexidine Gluconate Cloth 2 % PADS 6 each, 6 each, Topical, Daily, Adefeso, Oladapo, DO, 6 each at 07/17/23 0845   cloNIDine (CATAPRES - Dosed in mg/24 hr) patch 0.2 mg, 0.2 mg, Transdermal, Weekly, Tat, David, MD, 0.2 mg at 07/17/23 1816   cloNIDine (CATAPRES) tablet 0.1 mg, 0.1 mg, Per Tube, Q8H PRN, Hunsucker, Lesia Sago, MD, 0.1 mg at 07/16/23 2139   dexmedetomidine (PRECEDEX) 400 MCG/100ML (4 mcg/mL) infusion, 0-1.2 mcg/kg/hr, Intravenous, Titrated, Shahmehdi, Seyed A, MD, Stopped at 07/17/23 1136   folic acid injection 1 mg, 1 mg, Intravenous, Daily, Tat, David, MD, 1 mg at 07/17/23 0856   hydrALAZINE (APRESOLINE) injection 10 mg, 10 mg, Intravenous, Q6H PRN, Adefeso, Oladapo, DO, 10 mg at 07/17/23 2024   insulin aspart (novoLOG) injection 0-15 Units, 0-15 Units, Subcutaneous, Q6H, Tat, Onalee Hua, MD, 2 Units at 07/17/23 2358   LORazepam (ATIVAN) tablet 1-4 mg, 1-4 mg, Per Tube, Q4H PRN, 3 mg at 07/17/23 2111 **OR** LORazepam (ATIVAN) injection 1-2 mg, 1-2 mg, Intravenous, Q2H PRN, Tat, David, MD, 2 mg at 07/17/23 2357   LORazepam (ATIVAN) injection 2 mg, 2 mg, Intravenous, Once PRN, Adefeso, Oladapo, DO   metoprolol tartrate (LOPRESSOR) injection 7.5 mg, 7.5 mg, Intravenous, Q6H, Tat, Onalee Hua, MD, 7.5 mg at  07/17/23 2152   nicotine (NICODERM CQ - dosed in mg/24 hours) patch 14 mg, 14 mg, Transdermal, Daily, Adefeso, Oladapo, DO, 14 mg at 07/17/23 0844   ondansetron (ZOFRAN) tablet 4 mg, 4 mg, Per Tube, Q6H PRN **OR** ondansetron (ZOFRAN) injection 4 mg, 4 mg, Intravenous, Q6H PRN, Tat, David, MD, 4 mg at 07/17/23 2147   oxyCODONE (Oxy IR/ROXICODONE) immediate release tablet 5 mg, 5 mg, Per Tube, Q6H PRN, Tat, David, MD, 5 mg at 07/17/23 1344   polyethylene glycol (MIRALAX / GLYCOLAX) packet 17 g, 17 g, Per Tube, Daily, Tat, David, MD, 17 g at 07/17/23 0844   senna-docusate (Senokot-S) tablet 1 tablet, 1 tablet, Per Tube, BID, Tat, David, MD, 1 tablet at 07/17/23 2147   sodium chloride flush (NS) 0.9 % injection 10-40 mL, 10-40 mL, Intracatheter, Q12H, Tat, David, MD, 20 mL at 07/17/23 2147   sodium chloride flush (NS) 0.9 % injection 10-40 mL, 10-40 mL, Intracatheter, PRN, Tat, David, MD   thiamine (VITAMIN B1) injection 100 mg, 100 mg, Intravenous, Q24H, Tat, David, MD, 100 mg at 07/17/23 0844   TPN ADULT (ION), , Intravenous, Continuous TPN, Tat, David, MD, Last Rate: 80 mL/hr at 07/17/23 1906, Infusion Verify at 07/17/23 1906   traMADol (ULTRAM) tablet 50 mg, 50 mg, Per Tube, Q6H PRN, Tat, Onalee Hua, MD, 50 mg at 07/17/23 1610   I/O    Intake/Output Summary (Last 24 hours) at 07/18/2023 0113 Last data filed at 07/17/2023 1906 Gross per 24 hour  Intake 3952.81 ml  Output 2850 ml  Net 1102.81 ml     Physical Exam: Temp:  [98.1 F (36.7 C)-100.6 F (38.1 C)] 98.6 F (37 C) (07/27 1754) Pulse Rate:  [94-141] 141 (07/27 2300) Resp:  [19-29] 29 (07/27 2300) BP: (119-190)/(72-113) 169/82 (07/27 2300) SpO2:  [100 %] 100 % (07/27 2300) Weight:  [70.2 kg] 70.2 kg (07/27 0415)  Temp (24hrs), Avg:99 F (37.2 C), Min:98.1 F (36.7 C), Max:100.6 F (38.1 C)  GENERAL: Moving in bed . NGT in place  LUNGS: Clear to auscultation. No presence of rhonchi/wheezing/rales. Adequate chest expansion HEART:  RRR, normal s1 and s2. ABDOMEN: Soft, mild RLQ tenderness, no guarding, no peritoneal signs, and nondistended. BS +. No masses.   Laboratory Data: CBC:     Component Value Date/Time   WBC 17.4 (H) 07/17/2023 1135   RBC 2.96 (L) 07/17/2023 1135   HGB 9.1 (L) 07/17/2023 1135   HGB 12.7 05/18/2023 1139   HCT 26.2 (L) 07/17/2023 1135   HCT 36.6 05/18/2023 1139   PLT 157 07/17/2023 1135   PLT 233 05/18/2023 1139   MCV 88.5 07/17/2023 1135   MCV 90 05/18/2023 1139   MCH 30.7 07/17/2023 1135   MCHC 34.7 07/17/2023 1135   RDW 15.4 07/17/2023 1135   RDW 14.1 05/18/2023 1139   LYMPHSABS 0.9 05/08/2023 1201   LYMPHSABS 1.8 04/19/2023 1608   MONOABS 0.3 05/08/2023 1201   EOSABS 0.0 05/08/2023 1201   EOSABS 0.1 04/19/2023 1608   BASOSABS 0.0 05/08/2023 1201   BASOSABS 0.1 04/19/2023 1608   COAG:  Lab Results  Component Value Date   INR 1.0 04/05/2023    BMP:     Latest Ref Rng & Units 07/17/2023    3:25 AM 07/16/2023    6:04 AM 07/15/2023    6:00 AM  BMP  Glucose 70 - 99 mg/dL 469  629  85   BUN 6 - 20 mg/dL 7  10  14    Creatinine 0.44 - 1.00 mg/dL 5.28  4.13  2.44   Sodium 135 - 145 mmol/L 136  141  145   Potassium 3.5 - 5.1 mmol/L 3.0  3.5  3.4   Chloride 98 - 111 mmol/L 108  114  115   CO2 22 - 32 mmol/L 22  19  20    Calcium 8.9 - 10.3 mg/dL 9.0  9.0  9.1     HEPATIC:     Latest Ref Rng & Units 07/16/2023    6:04 AM 07/15/2023    6:00 AM 07/14/2023    4:56 AM  Hepatic Function  Total Protein 6.5 - 8.1 g/dL 6.3  6.4  6.0   Albumin 3.5 - 5.0 g/dL 2.8  2.9  2.8   AST 15 - 41 U/L 28  29  21    ALT 0 - 44 U/L 16  19  19    Alk Phosphatase 38 - 126 U/L 80  75  53   Total Bilirubin 0.3 - 1.2 mg/dL 0.7  1.0  0.8     CARDIAC: No results found for: "CKTOTAL", "CKMB", "CKMBINDEX", "TROPONINI"    Imaging: I personally reviewed and interpreted the available labs, imaging and endoscopic files.   Assessment/Plan:  Heidi [name] is a 52 year old female with history of HTN,  alcohol abuse, who presented to the ED with generalized bodyaches,nausea and vomiting .  Hospital course complicated by severe alcohol withdrawal with DTs on Precedex.  GI is following the patient for small bowel and colonic dilation status post flex  sig and decompression  #Ileus    Abdomen is much less distended today, patient is having bowel movement.  As per nurse had 3 bowel movements today  This is likely ileus given diffuse colonic and small bowel dilation.     Recs:   Correct potassium to keep over 4 and magnesium more than 2.  Potassium currently is 3.0 ( was given )    Repeat KUB tomorrow morning   Patient will need a complete colonoscopy and EGD as outpatient for abnormal CT findings  Vista Lawman, MD Gastroenterology and Hepatology [facility] Gastroenterology   This chart has been completed using [facility] Dictation software, and while attempts have been made to ensure accuracy , certain words and phrases may not be transcribed as intended

## 2023-07-18 NOTE — Progress Notes (Signed)
Pharmacy Antibiotic Note  Heidi [name] is a 52 y.o. female admitted on 07/05/2023 with sepsis.  Pharmacy has been consulted for vancomycin dosing.  Plan: Give vancomycin 1500 mg IV x 1 Then start vancomycin 1500 mg IV every 24 hours ( eAUC 469) (Scr used: 0.8) Order levels as indicated Monitor WBC, temp, and clinical picture F/u antibiotic plan   Height: 5\' 3"  (160 cm) Weight: 70.7 kg (155 lb 13.8 oz) IBW/kg (Calculated) : 52.4  Temp (24hrs), Avg:99.8 F (37.7 C), Min:98.6 F (37 C), Max:101.4 F (38.6 C)  Recent Labs  Lab 07/12/23 1610 07/13/23 0532 07/13/23 1302 07/14/23 0456 07/15/23 0600 07/15/23 1008 07/16/23 0604 07/16/23 0925 07/17/23 0325 07/17/23 1135 07/18/23 0430  WBC  --  7.0  --   --  15.8*  --  15.6*  --   --  17.4* 17.2*  CREATININE  --  0.59  --  0.47 0.74  --  0.64  --  0.45  --  0.49  LATICACIDVEN 1.6  --  1.6  --   --  0.8  --  1.4  --   --   --     Estimated Creatinine Clearance: 80.2 mL/min (by C-G formula based on SCr of 0.49 mg/dL).    No Known Allergies  Antimicrobials this admission: Unasyn 7/26 >> 7/28 Zosyn 7/28 >>  Vancomycin 7/28 >>   Dose adjustments this admission:  Microbiology results: 7/25 BCx: NGTD 7/15, 7/19 BCx: NG final 7/15 MRSA PCR: negative  Thank you for allowing pharmacy to be a part of this patient's care.  Griffin Dakin 07/18/2023 6:22 PM

## 2023-07-18 NOTE — Progress Notes (Signed)
PROGRESS NOTE  Heidi [name] ZOX:[id number] DOB: 1971-09-04 DOA: 07/05/2023 PCP: Rica Records, FNP  Brief History:   52 y.o. female with medical history significant of hypertension, alcohol abuse, tobacco use disorder who presents to the emergency department from home via EMS due to complaints of generalized bodyaches, nausea, vomiting which she thinks was related to alcohol withdrawal. Patient usually drinks 4 40 ounce beers daily, but she only had 2 12 ounce beers today.  Patient complained of pain in chest, legs, arms and endorsed having had some falls without any injury.  Her hospitalization has been prolonged secondary to delirium and agitation presumably from alcohol withdrawal.  She has been on Precedex since 07/07/2023.  Attempts at weaning had resulted in increased agitation.  Unfortunately, her hospitalization has been prolonged secondary to development of ileus.  NG was inserted to low intermittent suction on 07/14/2023.  PICC line placed on 07/15/23 and TPN was initiated due to prolonged period of no oral intake and prolonged ileus.   On 07/16/23, pt began showing signs of return of bowel function with 3 BMs in last 24 hours.  She developed a fever up to 102.2.  UA was negative.  Blood cultures were drawn and pt started on empiric unasyn  On 07/17/2023 in the late morning, the patient's Precedex was weaned off.  The patient remained fairly lucid and redirectable. She had one BM in last 24 hours.  TPN continues  On 07/10/2023, the patient continued to have fever up to 101.3 F.  She has had 2 bowel movements in last 24 hours.  She had NG output of 1150, but abdomen was less distended.   Assessment/Plan:  Acute metabolic encephalopathy/delirium tremens -Patient has been on Precedex and 07/07/2023 -Attempts at weaning has resulted in increased agitation -Unfortunately, the patient's ileus has limited oral medications to help mitigate her agitation -Continue IV Precedex  for now>>wean -Librium was discontinued -received high-dose thiamine  -07/14/23 Repeat UA 0-5WBC -B12--1649 -Ammonia--25 -Folic acid--8.7 -TSH--2.866 -UDS positive for benzodiazepine -07/14/23--add clonidine TTS-1 -continue to wean precedex>>down to 0.7 mcg/kg/min 7/26 -07/17/23--weaned off precedex around noon   Abdominal ileus -07/12/2023 CT AP--multiple dilated fluid-filled loops of SB and colon -Appreciate GI>>possible neostigmine -07/13/2023 flex sig--no obstruction noted--erythematous mucosa in the transverse colon with dilated in the descending, transverse: -07/14/2023--NG inserted to low intermittent suction -Appreciate general surgery and GI assistance -Remain n.p.o. -NG output remains--about 200-400 output each shift -passing flatus, had 2 BM last 24 hours -continue LIS>>clamp -optimize electrolytes   Fever -developed fever 102.2 in am 7/26 -fever 101.2 am 7/28 -blood cultures--neg to date -UA neg for pyuria -7/25 CXR personally reviewed--increased interstitial markings -KUB--personally reviewed--persistent bowel dilatation, unchanged -PCT 0.11 -lactic acid 0.8 -continue empiric unasyn -CT chest/ AP  Atrial tachycardia -7/27 personally reviewed EKG--atrial tach -increased metoprolol to 7.5 mg q 6 hours   FEN -Replace potassium>>give additional IV KCl -7/25 PICC line for TPN initiation   Essential hypertension -Irbesartan and diltiazem were initially discontinued secondary to low blood pressure -Patient is now hypertensive again -clonidine TTS-1 started 7/24>>increased to TTS-2 on 7/27 PM -continue IV lopressor>>increased to 7.5 mg q 6   Thrombocytopenia -Secondary to myelosuppression from alcohol use -improved and stable   Transaminasemia -due to Etoh -improved   Tobacco cessation -NicoDerm patch -Cessation discussed   Hypokalemia -Replete -Check magnesium 1.9                 Family Communication:  Family at bedside updated 7/27    Consultants:  PCCM   Code Status:  FULL    DVT Prophylaxis:  SCDs     Procedures: As Listed in Progress Note Above   Antibiotics: Unasyn 7/26>>           Subjective:  Patient is awake and and following commands. Denies cp. Complains of sob and abd pain.  States abd pain is a little better.  Denies n/v Objective: Vitals:   07/18/23 0900 07/18/23 1000 07/18/23 1100 07/18/23 1200  BP: (!) 159/101 (!) 172/92 (!) 166/106   Pulse:      Resp: (!) 21 19 (!) 27   Temp:    98.6 F (37 C)  TempSrc:    Oral  SpO2:      Weight:      Height:        Intake/Output Summary (Last 24 hours) at 07/18/2023 1226 Last data filed at 07/18/2023 1153 Gross per 24 hour  Intake 3678.93 ml  Output 4600 ml  Net -921.07 ml   Weight change:  Exam:  General:  Pt is alert, follows commands appropriately, not in acute distress HEENT: No icterus, No thrush, No neck mass, [place]/AT Cardiovascular: RRR, S1/S2, no rubs, no gallops Respiratory: bibasilar crackles. No wheeze Abdomen: Soft/+BS, mild upper abd  tender, mildly distended, no guarding Extremities: No edema, No lymphangitis, No petechiae, No rashes, no synovitis   Data Reviewed: I have personally reviewed following labs and imaging studies Basic Metabolic Panel: Recent Labs  Lab 07/13/23 0532 07/14/23 0456 07/15/23 0600 07/16/23 0604 07/17/23 0325 07/18/23 0430  NA 138 142 145 141 136 136  K 3.4* 3.4* 3.4* 3.5 3.0* 3.6  CL 107 112* 115* 114* 108 108  CO2 21* 22 20* 19* 22 25  GLUCOSE 127* 136* 85 136* 134* 155*  BUN 9 13 14 10 7 7   CREATININE 0.59 0.47 0.74 0.64 0.45 0.49  CALCIUM 9.7 9.1 9.1 9.0 9.0 9.0  MG 1.9  --  2.0 2.1 1.9 1.9  PHOS  --   --  2.9 3.0 3.6 4.5   Liver Function Tests: Recent Labs  Lab 07/14/23 0456 07/15/23 0600 07/16/23 0604 07/18/23 0430  AST 21 29 28 27   ALT 19 19 16 17   ALKPHOS 53 75 80 76  BILITOT 0.8 1.0 0.7 0.6  PROT 6.0* 6.4* 6.3* 6.2*  ALBUMIN 2.8* 2.9* 2.8* 2.6*   Recent Labs  Lab  07/12/23 0327  LIPASE 41   Recent Labs  Lab 07/15/23 0600  AMMONIA 25   Coagulation Profile: No results for input(s): "INR", "PROTIME" in the last 168 hours. CBC: Recent Labs  Lab 07/13/23 0532 07/15/23 0600 07/16/23 0604 07/17/23 1135 07/18/23 0430  WBC 7.0 15.8* 15.6* 17.4* 17.2*  HGB 12.4 10.7* 10.6* 9.1* 8.5*  HCT 37.9 33.4* 32.7* 26.2* 25.4*  MCV 92.4 93.0 92.9 88.5 89.8  PLT 158 215 174 157 173   Cardiac Enzymes: No results for input(s): "CKTOTAL", "CKMB", "CKMBINDEX", "TROPONINI" in the last 168 hours. BNP: Invalid input(s): "POCBNP" CBG: Recent Labs  Lab 07/17/23 1121 07/17/23 1733 07/17/23 2322 07/18/23 0605 07/18/23 1128  GLUCAP 135* 118* 127* 134* 143*   HbA1C: No results for input(s): "HGBA1C" in the last 72 hours. Urine analysis:    Component Value Date/Time   COLORURINE YELLOW 07/17/2023 1745   APPEARANCEUR CLEAR 07/17/2023 1745   LABSPEC 1.010 07/17/2023 1745   PHURINE 7.0 07/17/2023 1745   GLUCOSEU NEGATIVE 07/17/2023 1745   HGBUR NEGATIVE 07/17/2023 1745  BILIRUBINUR NEGATIVE 07/17/2023 1745   KETONESUR NEGATIVE 07/17/2023 1745   PROTEINUR NEGATIVE 07/17/2023 1745   UROBILINOGEN 0.2 01/04/2012 1330   NITRITE NEGATIVE 07/17/2023 1745   LEUKOCYTESUR NEGATIVE 07/17/2023 1745   Sepsis Labs: @LABRCNTIP (procalcitonin:4,lacticidven:4) ) Recent Results (from the past 240 hour(s))  Culture, blood (Routine X 2) w Reflex to ID Panel     Status: None   Collection Time: 07/09/23  9:09 AM   Specimen: BLOOD  Result Value Ref Range Status   Specimen Description BLOOD LEFT ASSIST CONTROL  Final   Special Requests   Final    BOTTLES DRAWN AEROBIC AND ANAEROBIC Blood Culture adequate volume   Culture   Final    NO GROWTH 6 DAYS Performed at [facility], [address] [place], [place] [address]    Report Status 07/15/2023 FINAL  Final  Culture, blood (Routine X 2) w Reflex to ID Panel     Status: None   Collection Time: 07/09/23  9:09 AM    Specimen: BLOOD  Result Value Ref Range Status   Specimen Description BLOOD LEFT HAND  Final   Special Requests   Final    BOTTLES DRAWN AEROBIC AND ANAEROBIC Blood Culture adequate volume   Culture   Final    NO GROWTH 6 DAYS Performed at [facility], [address], [place], [place] [address]    Report Status 07/15/2023 FINAL  Final  Culture, blood (Routine X 2) w Reflex to ID Panel     Status: None (Preliminary result)   Collection Time: 07/15/23 10:08 AM   Specimen: BLOOD RIGHT HAND  Result Value Ref Range Status   Specimen Description BLOOD RIGHT HAND  Final   Special Requests   Final    BOTTLES DRAWN AEROBIC AND ANAEROBIC Blood Culture adequate volume   Culture   Final    NO GROWTH 3 DAYS Performed at [facility], [address], [place], [place] [address]    Report Status PENDING  Incomplete  Culture, blood (Routine X 2) w Reflex to ID Panel     Status: None (Preliminary result)   Collection Time: 07/15/23 10:08 AM   Specimen: BLOOD  Result Value Ref Range Status   Specimen Description BLOOD BLOOD LEFT HAND  Final   Special Requests   Final    BOTTLES DRAWN AEROBIC AND ANAEROBIC Blood Culture adequate volume   Culture   Final    NO GROWTH 3 DAYS Performed at [facility], [address], [place], [place] [address]    Report Status PENDING  Incomplete     Scheduled Meds:  bisacodyl  10 mg Rectal Daily   Chlorhexidine Gluconate Cloth  6 each Topical Daily   cloNIDine  0.2 mg Transdermal Weekly   folic acid  1 mg Intravenous Daily   insulin aspart  0-15 Units Subcutaneous Q6H   iohexol  500 mL Oral Q1H   metoprolol tartrate  7.5 mg Intravenous Q6H   nicotine  14 mg Transdermal Daily   polyethylene glycol  17 g Per Tube Daily   senna-docusate  1 tablet Per Tube BID   sodium chloride flush  10-40 mL Intracatheter Q12H   thiamine (VITAMIN B1) injection  100 mg Intravenous Q24H   Continuous Infusions:  ampicillin-sulbactam (UNASYN) IV Stopped (07/18/23 1020)    dexmedetomidine (PRECEDEX) IV infusion Stopped (07/17/23 1136)   TPN ADULT (ION) 80 mL/hr at 07/18/23 1153   TPN ADULT (ION)      Procedures/Studies: DG Abd 1 View  Result Date: 07/18/2023  CLINICAL DATA:  Ileus EXAM: ABDOMEN - 1 VIEW COMPARISON:  07/16/2023 FINDINGS: NG tube remains positioned within the stomach. Decreasing size and number of dilated small bowel loops. Small bowel dilation now measures up to 4.4 cm (previously 5.4 cm). Persistent gaseous distension of the transverse colon. No gross free intraperitoneal air. IMPRESSION: Decreasing size and number of dilated small bowel loops. Appearance suggests resolving ileus or obstruction. Continued radiographic follow-up is recommended. Electronically Signed   By: Duanne Guess D.O.   On: 07/18/2023 09:42   DG Abd 1 View  Result Date: 07/16/2023 CLINICAL DATA:  Ileus, abdominal distention EXAM: ABDOMEN - 1 VIEW COMPARISON:  Previous studies including the examination of 07/15/2023 FINDINGS: There are dilated small bowel loops measuring up to 5.4 cm in diameter. Small-bowel dilation appears slightly more prominent. Mucosal folds in the small bowel loops appear prominent. Gas is present in right colon. Tip of NG tube is seen in the stomach. IMPRESSION: There is dilation of small-bowel loops along with mucosal thickening. Findings may suggest ileus or enteritis or distal colonic obstruction. Electronically Signed   By: Ernie Avena M.D.   On: 07/16/2023 11:41   DG Abd 1 View  Result Date: 07/15/2023 CLINICAL DATA:  [id number] Ileus [facility]) [id number] EXAM: ABDOMEN - 1 VIEW COMPARISON:  07/14/2023 FINDINGS: Enteric tube remains positioned within the stomach. Persistently dilated loops of small bowel within the abdomen, similar to prior. No gross free intraperitoneal air on supine view. IMPRESSION: Persistently dilated loops of small bowel within the abdomen, ileus versus obstruction. Electronically Signed   By: Duanne Guess D.O.   On: 07/15/2023  13:17   DG CHEST PORT 1 VIEW  Result Date: 07/15/2023 CLINICAL DATA:  Fever EXAM: PORTABLE CHEST 1 VIEW COMPARISON:  07/09/2023 FINDINGS: New enteric tube which extends into the stomach. The heart size and mediastinal contours are within normal limits. Both lungs are clear. The visualized skeletal structures are unremarkable. Gaseous distension of bowel within the imaged upper abdomen. IMPRESSION: 1. No active cardiopulmonary disease. 2. New enteric tube which extends into the stomach. Electronically Signed   By: Duanne Guess D.O.   On: 07/15/2023 13:15   [place] EKG SITE RITE  Result Date: 07/14/2023 If Site Rite image not attached, placement could not be confirmed due to current cardiac rhythm.  DG Abd Portable 1V  Result Date: 07/14/2023 CLINICAL DATA:  NG tube placement EXAM: PORTABLE ABDOMEN - 1 VIEW COMPARISON:  Previous studies including the examination none earlier today FINDINGS: There is interval placement of NG tube with its tip and side port within the stomach. Dilation of small-bowel loops in central abdomen has not changed. Gas is present in ascending and transverse colon. IMPRESSION: Tip of enteric tube is seen in the stomach. No change is seen in small bowel dilation. Electronically Signed   By: Ernie Avena M.D.   On: 07/14/2023 13:11   DG Abd 1 View  Result Date: 07/14/2023 CLINICAL DATA:  Abdominal distention EXAM: ABDOMEN - 1 VIEW COMPARISON:  Previous studies including the examination of 07/13/2023 FINDINGS: There is dilation of small-bowel loops in upper and mid abdomen. Gas is present in transverse colon. There is no demonstrable gas in rectosigmoid. No significant changes are noted in small bowel dilation. IMPRESSION: There is dilation of small bowel loops suggesting possible ileus or partial obstruction. No significant changes are noted. Electronically Signed   By: Ernie Avena M.D.   On: 07/14/2023 13:09   DG Abd 1 View  Result Date: 07/13/2023 CLINICAL  DATA:  Follow-up ileus, initial encounter EXAM: ABDOMEN - 1 VIEW COMPARISON:  None Available. FINDINGS: Scattered large and small bowel gas is noted. Multiple dilated loops of small bowel are again noted similar to that seen on the prior exam. No free air is seen. No mass lesion is noted. No bony abnormality is seen. IMPRESSION: Stable small bowel dilatation centrally. Electronically Signed   By: Alcide Clever M.D.   On: 07/13/2023 20:09   DG Abd 1 View  Result Date: 07/13/2023 CLINICAL DATA:  Abdominal distension. EXAM: ABDOMEN - 1 VIEW COMPARISON:  07/12/2023 FINDINGS: Persistent gaseous distension of bowel loops throughout the abdomen and pelvis. The degree of bowel distension has minimally changed. Upper abdomen is incompletely imaged and not clear if nasogastric tube is still present. Cannot evaluate for free air on the supine image. IMPRESSION: Persistent gaseous distension of small and large bowel loops. Based on the previous CT findings, a distal colonic obstruction cannot be excluded. Bowel gas distension has minimally changed. Electronically Signed   By: Richarda Overlie M.D.   On: 07/13/2023 16:33   DG Abd 1 View  Result Date: 07/12/2023 CLINICAL DATA:  Nasogastric tube placement. EXAM: ABDOMEN - 1 VIEW COMPARISON:  Radiograph and CT earlier today FINDINGS: Tip and side port of the enteric tube below the diaphragm in the stomach. Gaseous bowel distension persists throughout the abdomen. IMPRESSION: Tip and side port of the enteric tube below the diaphragm in the stomach. Electronically Signed   By: Narda Rutherford M.D.   On: 07/12/2023 17:06   CT ABDOMEN PELVIS WO CONTRAST  Result Date: 07/12/2023 CLINICAL DATA:  Abdominal pain, acute. EXAM: CT ABDOMEN AND PELVIS WITHOUT CONTRAST TECHNIQUE: Multidetector CT imaging of the abdomen and pelvis was performed following the standard protocol without IV contrast. RADIATION DOSE REDUCTION: This exam was performed according to the departmental  dose-optimization program which includes automated exposure control, adjustment of the mA and/or kV according to patient size and/or use of iterative reconstruction technique. COMPARISON:  Abdominal radiographs 07/12/2023. Abdominal ultrasound 07/06/2023. FINDINGS: Lower chest: Trace bilateral pleural effusions with associated dependent atelectasis at both lung bases. No consolidation or significant pericardial fluid. There is moderate distal esophageal wall thickening which is nonspecific. Hepatobiliary: No suspicious hepatic findings are identified on noncontrast imaging. In the dome of the left hepatic lobe, there is a 9 mm low-density lesion which is likely a cyst. No evidence of gallstones, gallbladder wall thickening or biliary dilatation. Pancreas: Unremarkable. No pancreatic ductal dilatation or surrounding inflammatory changes. Spleen: Normal in size without focal abnormality. Adrenals/Urinary Tract: Both adrenal glands appear normal. No evidence of urinary tract calculus, suspicious renal lesion or hydronephrosis. Mild nonspecific perinephric soft tissue stranding bilaterally. The bladder appears unremarkable for its degree of distention. Stomach/Bowel: There may be a minimal amount of enteric contrast dependently in the proximal stomach. The stomach and proximal small bowel are not significantly distended. However, as seen on earlier radiographs, there are multiple dilated and fluid-filled loops of mid to distal small bowel and colon. The colon is dilated into the mid descending colon. The sigmoid colon and rectum are relatively decompressed, although did no focal transition point or obvious colonic mass is identified on this noncontrast study. There is no bowel wall thickening or pneumatosis. There is some high density material within the lumen of the appendix, and the appendix appears mildly distended, although demonstrates no wall thickening or focal surrounding inflammation. A rectal tube is in place.  Vascular/Lymphatic: There are no enlarged abdominal or pelvic  lymph nodes. Aortic and branch vessel atherosclerosis without evidence of aneurysm. Vascular patency not addressed without contrast. Reproductive: The uterus and ovaries appear normal. No adnexal mass. Other: Moderate amount of low-density ascites. There is soft tissue stranding throughout the mesenteric fat. Possible peritoneal nodularity in the right mid abdomen measuring up to 2.3 x 1.2 cm on image 44/2. No pneumoperitoneum or abdominal wall hernia identified. Musculoskeletal: No acute or significant osseous findings. Unless specific follow-up recommendations are mentioned in the findings or impression sections, no imaging follow-up of any mentioned incidental findings is recommended. IMPRESSION: 1. Multiple dilated and fluid-filled loops of mid to distal small bowel and colon with relative decompression of the sigmoid colon and rectum. No focal transition point or obvious colonic mass is identified on this study which is limited by the lack of intravenous and enteric contrast. Findings could be secondary to an ileus from underlying peritonitis, although are concerning for possible distal colonic obstruction. Recommend further evaluation with colonoscopy. 2. Moderate amount of low-density ascites with soft tissue stranding throughout the mesenteric fat and possible peritoneal nodularity in the right mid abdomen. No evidence of bowel perforation. Cannot exclude peritoneal carcinomatosis. 3. Mildly prominent appendix without evidence of acute inflammation. 4. Trace bilateral pleural effusions with associated dependent atelectasis at both lung bases. 5. Distal esophageal wall thickening, nonspecific, but potentially related to esophagitis. 6.  Aortic Atherosclerosis (ICD10-I70.0). Electronically Signed   By: Carey Bullocks M.D.   On: 07/12/2023 13:24   DG Abd 1 View  Result Date: 07/12/2023 CLINICAL DATA:  Abdominal pain EXAM: ABDOMEN - 1 VIEW  COMPARISON:  None Available. FINDINGS: Significantly dilated bowel loops. No definite free air on this limited supine radiograph. No abnormal calcifications identified although bowel loops limited assessment. Lower lumbar degenerative change. IMPRESSION: Significantly dilated bowel loops which could represent obstruction or ileus. Consider CT of the abdomen/pelvis for further evaluation. Electronically Signed   By: Feliberto Harts M.D.   On: 07/12/2023 08:31   DG CHEST PORT 1 VIEW  Result Date: 07/09/2023 CLINICAL DATA:  Cough, alcohol withdrawal. EXAM: PORTABLE CHEST 1 VIEW COMPARISON:  Chest radiograph 04/08/2023 FINDINGS: The cardiomediastinal silhouette is normal There is no focal consolidation or pulmonary edema. There is no pleural effusion or pneumothorax There is no acute osseous abnormality. IMPRESSION: No radiographic evidence of acute cardiopulmonary process. Electronically Signed   By: Lesia Hausen M.D.   On: 07/09/2023 12:12   US Abdomen Limited RUQ (LIVER/GB)  Result Date: 07/06/2023 CLINICAL DATA:  Transaminitis EXAM: ULTRASOUND ABDOMEN LIMITED RIGHT UPPER QUADRANT COMPARISON:  None Available. FINDINGS: Gallbladder: No gallstones or wall thickening visualized. No sonographic Murphy sign noted by sonographer. Common bile duct: Diameter: 0.3 cm Liver: No focal lesion identified. Increased parenchymal echogenicity. Portal vein is patent on color Doppler imaging with normal direction of blood flow towards the liver. Other: None. IMPRESSION: 1. Hepatic steatosis. 2. Otherwise unremarkable right upper quadrant ultrasound. Electronically Signed   By: Jearld Lesch M.D.   On: 07/06/2023 14:47    Catarina Hartshorn, DO  [facility]  If 7PM-7AM, please contact night-coverage [email or web address] Password [facility] 07/18/2023, 12:26 PM   LOS: 13 days

## 2023-07-18 NOTE — Consult Note (Addendum)
PHARMACY - TOTAL PARENTERAL NUTRITION CONSULT NOTE   Indication: Prolonged ileus  Patient Measurements: Height: 5\' 3"  (160 cm) Weight: 70.2 kg (154 lb 12.2 oz) IBW/kg (Calculated) : 52.4 TPN AdjBW (KG): 55.7 Body mass index is 27.42 kg/m. Usual Weight: 67.2 kg  Assessment:   Glucose / Insulin: BG 118-155 (past 24 hours) 6 units given total Electrolytes: K 3.0 > 3.6 Na 136 Phos 3.6 > 4.5 Renal: WNL Hepatic: Albumin 2.8 Intake / Output; MIVF:  GI Imaging: 7/24 Abdominal US: Dilation of small-bowel loops in upper and mid abdomen. Gas is present in transverse colon. GI Surgeries / Procedures: 7/23 Flex sigmoidoscopy: Erythematous mucosa in transversa colon. No concerns for necrosis. Dilation in descending and transverse colon. Fluid aspiration performed.  Central access: PICC 7/25 TPN start date: 7/25  Nutritional Goals: Goal TPN rate is 80 mL/hr (provides 92 g of protein and 1731 kcals per day)  RD Assessment: Estimated Needs Total Energy Estimated Needs: 1700-1900 Total Protein Estimated Needs: 85-95 grams Total Fluid Estimated Needs: 1.7-1.9 L/day  Current Nutrition:  NPO  Plan:  Potassium chloride 10 mEq x 3 doses Magnesium sulfate 1 gram x 1 dose  Continue TPN at 80 mL/hr   Electrolytes in TPN: Na 77mEq/L, K 62mEq/L, Ca 69mEq/L, Mg 67mEq/L, and Phos 65mmol/L. Cl:Ac 1:2 Add standard MVI and trace elements to TPN Continue moderate q6h SSI and adjust as needed  Monitor TPN labs on Mon/Thurs  Judeth Cornfield, PharmD Clinical Pharmacist 07/18/2023 8:22 AM

## 2023-07-18 NOTE — Progress Notes (Signed)
[facility] Progress Note  5 Days Post-Op  Subjective: Patient seen and examined.  She is resting comfortably in bed.  She is more awake this morning, and is able to answer questions.  She denies abdominal pain.  She is not sure if she is passing flatus.  She had 2 bowel movements overnight.  Her NG tube remains in place with 1150 cc of gastric contents in the last 24 hours.  Objective: Vital signs in last 24 hours: Temp:  [98.1 F (36.7 C)-101.4 F (38.6 C)] 99.6 F (37.6 C) (07/28 0807) Pulse Rate:  [97-157] 137 (07/28 0857) Resp:  [19-34] 21 (07/28 0800) BP: (119-190)/(72-117) 182/117 (07/28 0857) SpO2:  [100 %] 100 % (07/28 0800) Weight:  [70.7 kg] 70.7 kg (07/28 0800) Last BM Date : 07/17/23  Intake/Output from previous day: 07/27 0701 - 07/28 0700 In: 4033.2 [I.V.:2201.7; NG/GT:480; IV Piggyback:1351.5] Out: 4750 [Urine:3600; Emesis/NG output:1150] Intake/Output this shift: Total I/O In: 220 [NG/GT:220] Out: -   General appearance: alert, cooperative, and no distress GI: Abdomen soft, mild distention, no percussion tenderness, minimal tenderness to palpation; no rigidity, guarding, rebound tenderness  Lab Results:  Recent Labs    07/17/23 1135 07/18/23 0430  WBC 17.4* 17.2*  HGB 9.1* 8.5*  HCT 26.2* 25.4*  PLT 157 173   BMET Recent Labs    07/17/23 0325 07/18/23 0430  NA 136 136  K 3.0* 3.6  CL 108 108  CO2 22 25  GLUCOSE 134* 155*  BUN 7 7  CREATININE 0.45 0.49  CALCIUM 9.0 9.0   PT/INR No results for input(s): "LABPROT", "INR" in the last 72 hours.  Studies/Results: DG Abd 1 View  Result Date: 07/18/2023 CLINICAL DATA:  Ileus EXAM: ABDOMEN - 1 VIEW COMPARISON:  07/16/2023 FINDINGS: NG tube remains positioned within the stomach. Decreasing size and number of dilated small bowel loops. Small bowel dilation now measures up to 4.4 cm (previously 5.4 cm). Persistent gaseous distension of the transverse colon. No gross free  intraperitoneal air. IMPRESSION: Decreasing size and number of dilated small bowel loops. Appearance suggests resolving ileus or obstruction. Continued radiographic follow-up is recommended. Electronically Signed   By: Duanne Guess D.O.   On: 07/18/2023 09:42    Anti-infectives: Anti-infectives (From admission, onward)    Start     Dose/Rate Route Frequency Ordered Stop   07/16/23 1100  Ampicillin-Sulbactam (UNASYN) 3 g in sodium chloride 0.9 % 100 mL IVPB        3 g 200 mL/hr over 30 Minutes Intravenous Every 6 hours 07/16/23 7829         Assessment/Plan:  Patient is a 52 year old female who was admitted with alcohol withdrawal.  She subsequently developed small bowel large bowel dilation, consistent with ileus  -Patient continues to have bowel function, 2 bowel movements overnight -KUB this morning with decreasing size and number of dilated loops of bowel, appearance suggests resolving ileus or obstruction -While patient has had slightly increased NG tube output, the output in the canister appears to be gastric contents -Will clamp NG tube and trial clears to see how patient does -Would continue TPN until patient having adequate oral intake -No plans for surgery -If patient's condition changes to the point of needing surgical intervention, she will be unable to undergo surgery at this facility and will require transfer 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 This was discussed with anesthesiology.  -Electrolyte replacement -Care per primary team   LOS: 13 days    Cheria Sadiq A Broden Holt  07/18/2023  

## 2023-07-18 NOTE — Plan of Care (Signed)

## 2023-07-18 NOTE — Progress Notes (Signed)
Notified by radiology regarding CT abd results  Heterogeneous and poorly defined low-attenuation lesion involving the majority of the spleen. Differential diagnosis includes splenic laceration/hematoma, infarct, phlegmon/developing abscess, or less likely neoplasm.  Discussed with general surgery Dr. Robyne Peers Broaden abx coverage Consult IR to see if amenable to any intervention Less likely abscess, but cannot be completely ruled out  Discussed with IR -Patient is hemodynamically stable, infact hypertensive -does not need emergent IR intervention -IR to will formally review case in am and make recommendation  Start vanc/zosy D/c Unasyn  DTat

## 2023-07-19 DIAGNOSIS — D696 Thrombocytopenia, unspecified: Secondary | ICD-10-CM | POA: Diagnosis not present

## 2023-07-19 DIAGNOSIS — F10939 Alcohol use, unspecified with withdrawal, unspecified: Secondary | ICD-10-CM | POA: Diagnosis not present

## 2023-07-19 DIAGNOSIS — K567 Ileus, unspecified: Secondary | ICD-10-CM | POA: Diagnosis not present

## 2023-07-19 DIAGNOSIS — E876 Hypokalemia: Secondary | ICD-10-CM | POA: Diagnosis not present

## 2023-07-19 DIAGNOSIS — R112 Nausea with vomiting, unspecified: Secondary | ICD-10-CM | POA: Diagnosis not present

## 2023-07-19 DIAGNOSIS — F17200 Nicotine dependence, unspecified, uncomplicated: Secondary | ICD-10-CM | POA: Diagnosis not present

## 2023-07-19 LAB — GLUCOSE, CAPILLARY
Glucose-Capillary: 178 mg/dL — ABNORMAL HIGH (ref 70–99)
Glucose-Capillary: 158 mg/dL — ABNORMAL HIGH (ref 70–99)
Glucose-Capillary: 167 mg/dL — ABNORMAL HIGH (ref 70–99)

## 2023-07-19 MED ORDER — TRAVASOL 10 % IV SOLN
INTRAVENOUS | Status: AC
  Filled 2023-07-19 (×2): qty 921.6

## 2023-07-19 MED ORDER — KETOROLAC TROMETHAMINE 15 MG/ML IJ SOLN
15.0000 mg | Freq: Once | INTRAMUSCULAR | Status: AC
  Administered 2023-07-19: 15 mg via INTRAVENOUS
  Filled 2023-07-19: qty 1

## 2023-07-19 MED ORDER — SODIUM CHLORIDE 0.9 % IV SOLN
1.0000 g | Freq: Three times a day (TID) | INTRAVENOUS | Status: DC
  Administered 2023-07-19 – 2023-07-22 (×10): 1 g via INTRAVENOUS
  Filled 2023-07-19 (×11): qty 20

## 2023-07-19 NOTE — Plan of Care (Signed)
  Problem: Acute Rehab PT Goals(only PT should resolve) Goal: Pt Will Go Supine/Side To Sit Outcome: Progressing Flowsheets (Taken 07/19/2023 1148) Pt will go Supine/Side to Sit: with minimal assist Goal: Patient Will Transfer Sit To/From Stand Outcome: Progressing Flowsheets (Taken 07/19/2023 1148) Patient will transfer sit to/from stand: with minimal assist Goal: Pt Will Transfer Bed To Chair/Chair To Bed Outcome: Progressing Flowsheets (Taken 07/19/2023 1148) Pt will Transfer Bed to Chair/Chair to Bed: with min assist Goal: Pt Will Ambulate Outcome: Progressing Flowsheets (Taken 07/19/2023 1148) Pt will Ambulate:  50 feet  with minimal assist  with rolling walker   11:48 AM, 07/19/23 Heidi [name], MPT Physical Therapist with [facility] [phone] office [phone] mobile phone

## 2023-07-19 NOTE — Consult Note (Signed)
PHARMACY - TOTAL PARENTERAL NUTRITION CONSULT NOTE   Indication: Prolonged ileus  Patient Measurements: Height: 5\' 3"  (160 cm) Weight: 67.6 kg (149 lb 0.5 oz) IBW/kg (Calculated) : 52.4 TPN AdjBW (KG): 55.7 Body mass index is 26.4 kg/m. Usual Weight: 67.2 kg  Assessment:   Glucose / Insulin: BG 143-178 (past 24 hours) 10 units given total Electrolytes: K 4.1, Na 130 Phos 4.5 Renal: WNL Hepatic: Albumin 3 Intake / Output; MIVF:  GI Imaging: 7/28 CT abdomen: Heterogeneous and poorly defined low-attenuation lesion involving the majority of the spleen. Mild diffuse dilatation of small bowel and nondependent colon, most likely due to ileus. 7/24 Abdominal US: Dilation of small-bowel loops in upper and mid abdomen. Gas is present in transverse colon. GI Surgeries / Procedures: 7/23 Flex sigmoidoscopy: Erythematous mucosa in transversa colon. No concerns for necrosis. Dilation in descending and transverse colon. Fluid aspiration performed.  Central access: PICC 7/25 TPN start date: 7/25  Nutritional Goals: Goal TPN rate is 80 mL/hr (provides 92 g of protein and 1731 kcals per day)  RD Assessment: Estimated Needs Total Energy Estimated Needs: 1700-1900 Total Protein Estimated Needs: 85-95 grams Total Fluid Estimated Needs: 1.7-1.9 L/day  Current Nutrition:  NPO  Plan:  No extra external electrolyte supplementation needed this morning.   Continue TPN at 80 mL/hr   Electrolytes in TPN: Na 86mEq/L, K 52mEq/L, Ca 8mEq/L, Mg 59mEq/L, and Phos 23mmol/L. Cl:Ac 1:2 Add standard MVI and trace elements to TPN Continue moderate q6h SSI and adjust as needed  Monitor TPN labs on Mon/Thurs  Sheppard Coil PharmD., BCPS Clinical Pharmacist 07/19/2023 9:03 AM

## 2023-07-19 NOTE — Progress Notes (Signed)
[facility] Progress Note  6 Days Post-Op  Subjective: Patient seen and examined. She is sitting in chair at the side of the bed.  She denies abdominal pain, nausea, and vomiting.  She is only complaining about her NG tube hurting in her nose.  There has been 1.6 L of output documented in the last 24 hours.  Output appears gastric in nature.  Objective: Vital signs in last 24 hours: Temp:  [98.4 F (36.9 C)-99.9 F (37.7 C)] 98.4 F (36.9 C) (07/29 1130) Pulse Rate:  [118-146] 143 (07/29 0921) Resp:  [11-40] 18 (07/29 0815) BP: (115-201)/(80-120) 138/117 (07/29 0921) SpO2:  [99 %-100 %] 99 % (07/29 0900) Weight:  [67.6 kg] 67.6 kg (07/29 0502) Last BM Date : 07/17/23  Intake/Output from previous day: 07/28 0701 - 07/29 0700 In: 5320.3 [P.O.:480; I.V.:2028.5; NG/GT:970; IV Piggyback:1841.9] Out: 5550 [Urine:3900; Emesis/NG output:1650] Intake/Output this shift: No intake/output data recorded.  General appearance: alert, cooperative, and no distress GI: Abdomen soft, mild distention, no percussion tenderness, nontender to palpation; no rigidity, guarding, rebound tenderness; no tenderness in left upper quadrant  Lab Results:  Recent Labs    07/18/23 0430 07/19/23 0402  WBC 17.2* 19.3*  HGB 8.5* 9.5*  HCT 25.4* 28.6*  PLT 173 218   BMET Recent Labs    07/18/23 0430 07/19/23 0402  NA 136 130*  K 3.6 4.1  CL 108 103  CO2 25 21*  GLUCOSE 155* 174*  BUN 7 8  CREATININE 0.49 0.45  CALCIUM 9.0 9.7   PT/INR No results for input(s): "LABPROT", "INR" in the last 72 hours.  Studies/Results: CT ABDOMEN PELVIS W CONTRAST  Result Date: 07/18/2023 CLINICAL DATA:  Fever. Respiratory illness. Abdominal pain. Ileus. Colitis. EXAM: CT CHEST, ABDOMEN, AND PELVIS WITH CONTRAST TECHNIQUE: Multidetector CT imaging of the chest, abdomen and pelvis was performed following the standard protocol during bolus administration of intravenous contrast. RADIATION DOSE  REDUCTION: This exam was performed according to the departmental dose-optimization program which includes automated exposure control, adjustment of the mA and/or kV according to patient size and/or use of iterative reconstruction technique. CONTRAST:  OMNIPAQUE IOHEXOL 300 MG/ML  SOLN COMPARISON:  Noncontrast AP CT on 07/12/2023 FINDINGS: CT CHEST FINDINGS Cardiovascular: No acute findings. Mediastinum/Lymph Nodes: No masses or pathologically enlarged lymph nodes identified. Lungs/Pleura: No suspicious pulmonary nodules or masses identified. No No evidence of pulmonary consolidation or central endobronchial obstruction. Small left pleural effusion noted as well as mild dependent atelectasis. Musculoskeletal:  No suspicious bone lesions identified. CT ABDOMEN AND PELVIS FINDINGS Hepatobiliary: No masses identified. Gallbladder is unremarkable. No evidence of biliary ductal dilatation. Pancreas:  No mass or inflammatory changes. Spleen: Within normal limits in size. Heterogeneous and poorly defined low-attenuation lesion is seen involving the majority of the spleen, without perisplenic fluid. Differential diagnosis includes splenic laceration, infarct, abscess, or less likely neoplasm. Adrenals/Urinary tract: No suspicious masses or hydronephrosis. Stomach/Bowel: Nasogastric tube tip in body of stomach. Mild diffuse dilatation of small bowel and nondependent colon, most likely due to ileus. No evidence of obstruction, inflammatory process, or abnormal fluid collections. Normal appendix visualized. Vascular/Lymphatic: No pathologically enlarged lymph nodes identified. No acute vascular findings. Reproductive: No mass or inflammatory changes identified. Small amount of low-attenuation free fluid seen in pelvic cul-de-sac. Other:  None. Musculoskeletal:  No suspicious bone lesions identified. IMPRESSION: Heterogeneous and poorly defined low-attenuation lesion involving the majority of the spleen. Differential  diagnosis includes splenic laceration/hematoma, infarct, phlegmon/developing abscess, or less likely neoplasm. Mild diffuse dilatation  of small bowel and nondependent colon, most likely due to ileus. Small left pleural effusion and free pelvic fluid. Critical Value/emergent results were called by telephone at the time of interpretation on 07/18/2023 at 4:46 pm to provider DAVID TAT , who verbally acknowledged these results. Electronically Signed   By: Danae Orleans M.D.   On: 07/18/2023 17:04   CT CHEST W CONTRAST  Result Date: 07/18/2023 CLINICAL DATA:  Fever. Respiratory illness. Abdominal pain. Ileus. Colitis. EXAM: CT CHEST, ABDOMEN, AND PELVIS WITH CONTRAST TECHNIQUE: Multidetector CT imaging of the chest, abdomen and pelvis was performed following the standard protocol during bolus administration of intravenous contrast. RADIATION DOSE REDUCTION: This exam was performed according to the departmental dose-optimization program which includes automated exposure control, adjustment of the mA and/or kV according to patient size and/or use of iterative reconstruction technique. CONTRAST:  OMNIPAQUE IOHEXOL 300 MG/ML  SOLN COMPARISON:  Noncontrast AP CT on 07/12/2023 FINDINGS: CT CHEST FINDINGS Cardiovascular: No acute findings. Mediastinum/Lymph Nodes: No masses or pathologically enlarged lymph nodes identified. Lungs/Pleura: No suspicious pulmonary nodules or masses identified. No No evidence of pulmonary consolidation or central endobronchial obstruction. Small left pleural effusion noted as well as mild dependent atelectasis. Musculoskeletal:  No suspicious bone lesions identified. CT ABDOMEN AND PELVIS FINDINGS Hepatobiliary: No masses identified. Gallbladder is unremarkable. No evidence of biliary ductal dilatation. Pancreas:  No mass or inflammatory changes. Spleen: Within normal limits in size. Heterogeneous and poorly defined low-attenuation lesion is seen involving the majority of the spleen,  without perisplenic fluid. Differential diagnosis includes splenic laceration, infarct, abscess, or less likely neoplasm. Adrenals/Urinary tract: No suspicious masses or hydronephrosis. Stomach/Bowel: Nasogastric tube tip in body of stomach. Mild diffuse dilatation of small bowel and nondependent colon, most likely due to ileus. No evidence of obstruction, inflammatory process, or abnormal fluid collections. Normal appendix visualized. Vascular/Lymphatic: No pathologically enlarged lymph nodes identified. No acute vascular findings. Reproductive: No mass or inflammatory changes identified. Small amount of low-attenuation free fluid seen in pelvic cul-de-sac. Other:  None. Musculoskeletal:  No suspicious bone lesions identified. IMPRESSION: Heterogeneous and poorly defined low-attenuation lesion involving the majority of the spleen. Differential diagnosis includes splenic laceration/hematoma, infarct, phlegmon/developing abscess, or less likely neoplasm. Mild diffuse dilatation of small bowel and nondependent colon, most likely due to ileus. Small left pleural effusion and free pelvic fluid. Critical Value/emergent results were called by telephone at the time of interpretation on 07/18/2023 at 4:46 pm to provider DAVID TAT , who verbally acknowledged these results. Electronically Signed   By: Danae Orleans M.D.   On: 07/18/2023 17:04   DG Abd 1 View  Result Date: 07/18/2023 CLINICAL DATA:  Ileus EXAM: ABDOMEN - 1 VIEW COMPARISON:  07/16/2023 FINDINGS: NG tube remains positioned within the stomach. Decreasing size and number of dilated small bowel loops. Small bowel dilation now measures up to 4.4 cm (previously 5.4 cm). Persistent gaseous distension of the transverse colon. No gross free intraperitoneal air. IMPRESSION: Decreasing size and number of dilated small bowel loops. Appearance suggests resolving ileus or obstruction. Continued radiographic follow-up is recommended. Electronically Signed   By: Duanne Guess D.O.   On: 07/18/2023 09:42    Anti-infectives: Anti-infectives (From admission, onward)    Start     Dose/Rate Route Frequency Ordered Stop   07/19/23 1800  vancomycin (VANCOREADY) IVPB 1500 mg/300 mL        1,500 mg 150 mL/hr over 120 Minutes Intravenous Every 24 hours 07/18/23 1840     07/18/23 2200  piperacillin-tazobactam (ZOSYN) IVPB 3.375 g        3.375 g 12.5 mL/hr over 240 Minutes Intravenous Every 8 hours 07/18/23 1809     07/18/23 1845  vancomycin (VANCOREADY) IVPB 1500 mg/300 mL        1,500 mg 150 mL/hr over 120 Minutes Intravenous STAT 07/18/23 1840 07/18/23 2124   07/16/23 1100  Ampicillin-Sulbactam (UNASYN) 3 g in sodium chloride 0.9 % 100 mL IVPB  Status:  Discontinued        3 g 200 mL/hr over 30 Minutes Intravenous Every 6 hours 07/16/23 0649 07/18/23 1800       Assessment/Plan:  Patient is a 52 year old female who was admitted with alcohol withdrawal.  She subsequently developed an adynamic ileus with small and large bowel dilation.  -She underwent CT of the chest, abdomen, and pelvis yesterday which demonstrated concern for possible splenic laceration versus abscess versus infarct -Patient did undergo flexible sigmoidoscopy last week to the mid transverse colon, so this could potentially be a splenic laceration secondary to the scope -Given her leukocytosis and fevers, we cannot fully rule out abscess at this time.  Continue broad-spectrum antibiotics.   -Patient also has normal procalcitonin, which would suggest against a possible infectious source.  It is also possible that she has fevers and leukocytosis secondary to the splenic laceration -Will obtain C. difficile and GI panel to evaluate for other causes of fever and leukocytosis -CBC with differential tomorrow morning -Will plan to reimage spleen later this week to evaluate for any change -Given improving bowel dilation on CT scan with no nausea and vomiting after NG tube was clamped yesterday, will  remove NG tube and initiate clears -Continue TPN until adequate oral intake -If patient's condition changes to the point of needing surgical intervention, she will be unable to undergo surgery at this facility and will require transfer 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 This was discussed with anesthesiology.  -Electrolyte replacement -Remainder of care per primary team   LOS: 14 days    Tamicka Shimon A Penny Frisbie 07/19/2023

## 2023-07-19 NOTE — Progress Notes (Signed)
Gastroenterology Progress Note   Primary Care Physician:  Rica Records, FNP Primary Gastroenterologist:  Dr. Marletta Lor  Patient ID: [name]; [id number]; 03-16-1971    Subjective   Sitting up in chair. Diaphoretic appearing, HR in the 140s sustained. On diltiazem drip. Difficult historian. States she wants to talk to her mother down the hall. Denies abdominal pain, N/V. States passing flatus. NG tube remains. Speech garbled/mumbling. Per nursing staff, 600-700 NGT output in past 24 hours. Last BM Saturday evening.     Objective   Vital signs in last 24 hours Temp:  [98.6 F (37 C)-99.9 F (37.7 C)] 99 F (37.2 C) (07/29 0400) Pulse Rate:  [118-146] 133 (07/29 0500) Resp:  [11-40] 22 (07/29 0630) BP: (117-201)/(80-120) 154/95 (07/29 0630) SpO2:  [100 %] 100 % (07/29 0500) Weight:  [67.6 kg-70.7 kg] 67.6 kg (07/29 0502) Last BM Date : 07/17/23  Physical Exam General:   Alert and oriented to person, diaphoretic, flat affect, sitting up in chair Head:  Normocephalic and atraumatic. Abdomen:  Bowel sounds present, mild distended but soft, non-tender, Extremities:  Without edema.  Intake/Output from previous day: 07/28 0701 - 07/29 0700 In: 5320.3 [P.O.:480; I.V.:2028.5; NG/GT:970; IV Piggyback:1841.9] Out: 5550 [Urine:3900; Emesis/NG output:1650] Intake/Output this shift: No intake/output data recorded.  Lab Results  Recent Labs    07/17/23 1135 07/18/23 0430 07/19/23 0402  WBC 17.4* 17.2* 19.3*  HGB 9.1* 8.5* 9.5*  HCT 26.2* 25.4* 28.6*  PLT 157 173 218   BMET Recent Labs    07/17/23 0325 07/18/23 0430 07/19/23 0402  NA 136 136 130*  K 3.0* 3.6 4.1  CL 108 108 103  CO2 22 25 21*  GLUCOSE 134* 155* 174*  BUN 7 7 8   CREATININE 0.45 0.49 0.45  CALCIUM 9.0 9.0 9.7   LFT Recent Labs    07/18/23 0430 07/19/23 0402  PROT 6.2* 7.0  ALBUMIN 2.6* 3.0*  AST 27 30  ALT 17 25  ALKPHOS 76 88  BILITOT 0.6 0.7    Studies/Results CT  ABDOMEN PELVIS W CONTRAST  Result Date: 07/18/2023 CLINICAL DATA:  Fever. Respiratory illness. Abdominal pain. Ileus. Colitis. EXAM: CT CHEST, ABDOMEN, AND PELVIS WITH CONTRAST TECHNIQUE: Multidetector CT imaging of the chest, abdomen and pelvis was performed following the standard protocol during bolus administration of intravenous contrast. RADIATION DOSE REDUCTION: This exam was performed according to the departmental dose-optimization program which includes automated exposure control, adjustment of the mA and/or kV according to patient size and/or use of iterative reconstruction technique. CONTRAST:  OMNIPAQUE IOHEXOL 300 MG/ML  SOLN COMPARISON:  Noncontrast AP CT on 07/12/2023 FINDINGS: CT CHEST FINDINGS Cardiovascular: No acute findings. Mediastinum/Lymph Nodes: No masses or pathologically enlarged lymph nodes identified. Lungs/Pleura: No suspicious pulmonary nodules or masses identified. No No evidence of pulmonary consolidation or central endobronchial obstruction. Small left pleural effusion noted as well as mild dependent atelectasis. Musculoskeletal:  No suspicious bone lesions identified. CT ABDOMEN AND PELVIS FINDINGS Hepatobiliary: No masses identified. Gallbladder is unremarkable. No evidence of biliary ductal dilatation. Pancreas:  No mass or inflammatory changes. Spleen: Within normal limits in size. Heterogeneous and poorly defined low-attenuation lesion is seen involving the majority of the spleen, without perisplenic fluid. Differential diagnosis includes splenic laceration, infarct, abscess, or less likely neoplasm. Adrenals/Urinary tract: No suspicious masses or hydronephrosis. Stomach/Bowel: Nasogastric tube tip in body of stomach. Mild diffuse dilatation of small bowel and nondependent colon, most likely due to ileus. No evidence of obstruction, inflammatory process, or abnormal  fluid collections. Normal appendix visualized. Vascular/Lymphatic: No pathologically enlarged lymph nodes  identified. No acute vascular findings. Reproductive: No mass or inflammatory changes identified. Small amount of low-attenuation free fluid seen in pelvic cul-de-sac. Other:  None. Musculoskeletal:  No suspicious bone lesions identified. IMPRESSION: Heterogeneous and poorly defined low-attenuation lesion involving the majority of the spleen. Differential diagnosis includes splenic laceration/hematoma, infarct, phlegmon/developing abscess, or less likely neoplasm. Mild diffuse dilatation of small bowel and nondependent colon, most likely due to ileus. Small left pleural effusion and free pelvic fluid. Critical Value/emergent results were called by telephone at the time of interpretation on 07/18/2023 at 4:46 pm to provider DAVID TAT , who verbally acknowledged these results. Electronically Signed   By: Danae Orleans M.D.   On: 07/18/2023 17:04   CT CHEST W CONTRAST  Result Date: 07/18/2023 CLINICAL DATA:  Fever. Respiratory illness. Abdominal pain. Ileus. Colitis. EXAM: CT CHEST, ABDOMEN, AND PELVIS WITH CONTRAST TECHNIQUE: Multidetector CT imaging of the chest, abdomen and pelvis was performed following the standard protocol during bolus administration of intravenous contrast. RADIATION DOSE REDUCTION: This exam was performed according to the departmental dose-optimization program which includes automated exposure control, adjustment of the mA and/or kV according to patient size and/or use of iterative reconstruction technique. CONTRAST:  OMNIPAQUE IOHEXOL 300 MG/ML  SOLN COMPARISON:  Noncontrast AP CT on 07/12/2023 FINDINGS: CT CHEST FINDINGS Cardiovascular: No acute findings. Mediastinum/Lymph Nodes: No masses or pathologically enlarged lymph nodes identified. Lungs/Pleura: No suspicious pulmonary nodules or masses identified. No No evidence of pulmonary consolidation or central endobronchial obstruction. Small left pleural effusion noted as well as mild dependent atelectasis. Musculoskeletal:  No  suspicious bone lesions identified. CT ABDOMEN AND PELVIS FINDINGS Hepatobiliary: No masses identified. Gallbladder is unremarkable. No evidence of biliary ductal dilatation. Pancreas:  No mass or inflammatory changes. Spleen: Within normal limits in size. Heterogeneous and poorly defined low-attenuation lesion is seen involving the majority of the spleen, without perisplenic fluid. Differential diagnosis includes splenic laceration, infarct, abscess, or less likely neoplasm. Adrenals/Urinary tract: No suspicious masses or hydronephrosis. Stomach/Bowel: Nasogastric tube tip in body of stomach. Mild diffuse dilatation of small bowel and nondependent colon, most likely due to ileus. No evidence of obstruction, inflammatory process, or abnormal fluid collections. Normal appendix visualized. Vascular/Lymphatic: No pathologically enlarged lymph nodes identified. No acute vascular findings. Reproductive: No mass or inflammatory changes identified. Small amount of low-attenuation free fluid seen in pelvic cul-de-sac. Other:  None. Musculoskeletal:  No suspicious bone lesions identified. IMPRESSION: Heterogeneous and poorly defined low-attenuation lesion involving the majority of the spleen. Differential diagnosis includes splenic laceration/hematoma, infarct, phlegmon/developing abscess, or less likely neoplasm. Mild diffuse dilatation of small bowel and nondependent colon, most likely due to ileus. Small left pleural effusion and free pelvic fluid. Critical Value/emergent results were called by telephone at the time of interpretation on 07/18/2023 at 4:46 pm to provider DAVID TAT , who verbally acknowledged these results. Electronically Signed   By: Danae Orleans M.D.   On: 07/18/2023 17:04   DG Abd 1 View  Result Date: 07/18/2023 CLINICAL DATA:  Ileus EXAM: ABDOMEN - 1 VIEW COMPARISON:  07/16/2023 FINDINGS: NG tube remains positioned within the stomach. Decreasing size and number of dilated small bowel loops. Small  bowel dilation now measures up to 4.4 cm (previously 5.4 cm). Persistent gaseous distension of the transverse colon. No gross free intraperitoneal air. IMPRESSION: Decreasing size and number of dilated small bowel loops. Appearance suggests resolving ileus or obstruction. Continued radiographic follow-up is recommended. Electronically  Signed   By: Duanne Guess D.O.   On: 07/18/2023 09:42   DG Abd 1 View  Result Date: 07/16/2023 CLINICAL DATA:  Ileus, abdominal distention EXAM: ABDOMEN - 1 VIEW COMPARISON:  Previous studies including the examination of 07/15/2023 FINDINGS: There are dilated small bowel loops measuring up to 5.4 cm in diameter. Small-bowel dilation appears slightly more prominent. Mucosal folds in the small bowel loops appear prominent. Gas is present in right colon. Tip of NG tube is seen in the stomach. IMPRESSION: There is dilation of small-bowel loops along with mucosal thickening. Findings may suggest ileus or enteritis or distal colonic obstruction. Electronically Signed   By: Ernie Avena M.D.   On: 07/16/2023 11:41   DG Abd 1 View  Result Date: 07/15/2023 CLINICAL DATA:  [id number] Ileus [facility]) [id number] EXAM: ABDOMEN - 1 VIEW COMPARISON:  07/14/2023 FINDINGS: Enteric tube remains positioned within the stomach. Persistently dilated loops of small bowel within the abdomen, similar to prior. No gross free intraperitoneal air on supine view. IMPRESSION: Persistently dilated loops of small bowel within the abdomen, ileus versus obstruction. Electronically Signed   By: Duanne Guess D.O.   On: 07/15/2023 13:17   DG CHEST PORT 1 VIEW  Result Date: 07/15/2023 CLINICAL DATA:  Fever EXAM: PORTABLE CHEST 1 VIEW COMPARISON:  07/09/2023 FINDINGS: New enteric tube which extends into the stomach. The heart size and mediastinal contours are within normal limits. Both lungs are clear. The visualized skeletal structures are unremarkable. Gaseous distension of bowel within the imaged upper  abdomen. IMPRESSION: 1. No active cardiopulmonary disease. 2. New enteric tube which extends into the stomach. Electronically Signed   By: Duanne Guess D.O.   On: 07/15/2023 13:15   [place] EKG SITE RITE  Result Date: 07/14/2023 If Site Rite image not attached, placement could not be confirmed due to current cardiac rhythm.  DG Abd Portable 1V  Result Date: 07/14/2023 CLINICAL DATA:  NG tube placement EXAM: PORTABLE ABDOMEN - 1 VIEW COMPARISON:  Previous studies including the examination none earlier today FINDINGS: There is interval placement of NG tube with its tip and side port within the stomach. Dilation of small-bowel loops in central abdomen has not changed. Gas is present in ascending and transverse colon. IMPRESSION: Tip of enteric tube is seen in the stomach. No change is seen in small bowel dilation. Electronically Signed   By: Ernie Avena M.D.   On: 07/14/2023 13:11   DG Abd 1 View  Result Date: 07/14/2023 CLINICAL DATA:  Abdominal distention EXAM: ABDOMEN - 1 VIEW COMPARISON:  Previous studies including the examination of 07/13/2023 FINDINGS: There is dilation of small-bowel loops in upper and mid abdomen. Gas is present in transverse colon. There is no demonstrable gas in rectosigmoid. No significant changes are noted in small bowel dilation. IMPRESSION: There is dilation of small bowel loops suggesting possible ileus or partial obstruction. No significant changes are noted. Electronically Signed   By: Ernie Avena M.D.   On: 07/14/2023 13:09   DG Abd 1 View  Result Date: 07/13/2023 CLINICAL DATA:  Follow-up ileus, initial encounter EXAM: ABDOMEN - 1 VIEW COMPARISON:  None Available. FINDINGS: Scattered large and small bowel gas is noted. Multiple dilated loops of small bowel are again noted similar to that seen on the prior exam. No free air is seen. No mass lesion is noted. No bony abnormality is seen. IMPRESSION: Stable small bowel dilatation centrally. Electronically  Signed   By: Alcide Clever M.D.   On: 07/13/2023  20:09   DG Abd 1 View  Result Date: 07/13/2023 CLINICAL DATA:  Abdominal distension. EXAM: ABDOMEN - 1 VIEW COMPARISON:  07/12/2023 FINDINGS: Persistent gaseous distension of bowel loops throughout the abdomen and pelvis. The degree of bowel distension has minimally changed. Upper abdomen is incompletely imaged and not clear if nasogastric tube is still present. Cannot evaluate for free air on the supine image. IMPRESSION: Persistent gaseous distension of small and large bowel loops. Based on the previous CT findings, a distal colonic obstruction cannot be excluded. Bowel gas distension has minimally changed. Electronically Signed   By: Richarda Overlie M.D.   On: 07/13/2023 16:33   DG Abd 1 View  Result Date: 07/12/2023 CLINICAL DATA:  Nasogastric tube placement. EXAM: ABDOMEN - 1 VIEW COMPARISON:  Radiograph and CT earlier today FINDINGS: Tip and side port of the enteric tube below the diaphragm in the stomach. Gaseous bowel distension persists throughout the abdomen. IMPRESSION: Tip and side port of the enteric tube below the diaphragm in the stomach. Electronically Signed   By: Narda Rutherford M.D.   On: 07/12/2023 17:06   CT ABDOMEN PELVIS WO CONTRAST  Result Date: 07/12/2023 CLINICAL DATA:  Abdominal pain, acute. EXAM: CT ABDOMEN AND PELVIS WITHOUT CONTRAST TECHNIQUE: Multidetector CT imaging of the abdomen and pelvis was performed following the standard protocol without IV contrast. RADIATION DOSE REDUCTION: This exam was performed according to the departmental dose-optimization program which includes automated exposure control, adjustment of the mA and/or kV according to patient size and/or use of iterative reconstruction technique. COMPARISON:  Abdominal radiographs 07/12/2023. Abdominal ultrasound 07/06/2023. FINDINGS: Lower chest: Trace bilateral pleural effusions with associated dependent atelectasis at both lung bases. No consolidation or  significant pericardial fluid. There is moderate distal esophageal wall thickening which is nonspecific. Hepatobiliary: No suspicious hepatic findings are identified on noncontrast imaging. In the dome of the left hepatic lobe, there is a 9 mm low-density lesion which is likely a cyst. No evidence of gallstones, gallbladder wall thickening or biliary dilatation. Pancreas: Unremarkable. No pancreatic ductal dilatation or surrounding inflammatory changes. Spleen: Normal in size without focal abnormality. Adrenals/Urinary Tract: Both adrenal glands appear normal. No evidence of urinary tract calculus, suspicious renal lesion or hydronephrosis. Mild nonspecific perinephric soft tissue stranding bilaterally. The bladder appears unremarkable for its degree of distention. Stomach/Bowel: There may be a minimal amount of enteric contrast dependently in the proximal stomach. The stomach and proximal small bowel are not significantly distended. However, as seen on earlier radiographs, there are multiple dilated and fluid-filled loops of mid to distal small bowel and colon. The colon is dilated into the mid descending colon. The sigmoid colon and rectum are relatively decompressed, although did no focal transition point or obvious colonic mass is identified on this noncontrast study. There is no bowel wall thickening or pneumatosis. There is some high density material within the lumen of the appendix, and the appendix appears mildly distended, although demonstrates no wall thickening or focal surrounding inflammation. A rectal tube is in place. Vascular/Lymphatic: There are no enlarged abdominal or pelvic lymph nodes. Aortic and branch vessel atherosclerosis without evidence of aneurysm. Vascular patency not addressed without contrast. Reproductive: The uterus and ovaries appear normal. No adnexal mass. Other: Moderate amount of low-density ascites. There is soft tissue stranding throughout the mesenteric fat. Possible  peritoneal nodularity in the right mid abdomen measuring up to 2.3 x 1.2 cm on image 44/2. No pneumoperitoneum or abdominal wall hernia identified. Musculoskeletal: No acute or significant osseous findings. Unless  specific follow-up recommendations are mentioned in the findings or impression sections, no imaging follow-up of any mentioned incidental findings is recommended. IMPRESSION: 1. Multiple dilated and fluid-filled loops of mid to distal small bowel and colon with relative decompression of the sigmoid colon and rectum. No focal transition point or obvious colonic mass is identified on this study which is limited by the lack of intravenous and enteric contrast. Findings could be secondary to an ileus from underlying peritonitis, although are concerning for possible distal colonic obstruction. Recommend further evaluation with colonoscopy. 2. Moderate amount of low-density ascites with soft tissue stranding throughout the mesenteric fat and possible peritoneal nodularity in the right mid abdomen. No evidence of bowel perforation. Cannot exclude peritoneal carcinomatosis. 3. Mildly prominent appendix without evidence of acute inflammation. 4. Trace bilateral pleural effusions with associated dependent atelectasis at both lung bases. 5. Distal esophageal wall thickening, nonspecific, but potentially related to esophagitis. 6.  Aortic Atherosclerosis (ICD10-I70.0). Electronically Signed   By: Carey Bullocks M.D.   On: 07/12/2023 13:24   DG Abd 1 View  Result Date: 07/12/2023 CLINICAL DATA:  Abdominal pain EXAM: ABDOMEN - 1 VIEW COMPARISON:  None Available. FINDINGS: Significantly dilated bowel loops. No definite free air on this limited supine radiograph. No abnormal calcifications identified although bowel loops limited assessment. Lower lumbar degenerative change. IMPRESSION: Significantly dilated bowel loops which could represent obstruction or ileus. Consider CT of the abdomen/pelvis for further  evaluation. Electronically Signed   By: Feliberto Harts M.D.   On: 07/12/2023 08:31   DG CHEST PORT 1 VIEW  Result Date: 07/09/2023 CLINICAL DATA:  Cough, alcohol withdrawal. EXAM: PORTABLE CHEST 1 VIEW COMPARISON:  Chest radiograph 04/08/2023 FINDINGS: The cardiomediastinal silhouette is normal There is no focal consolidation or pulmonary edema. There is no pleural effusion or pneumothorax There is no acute osseous abnormality. IMPRESSION: No radiographic evidence of acute cardiopulmonary process. Electronically Signed   By: Lesia Hausen M.D.   On: 07/09/2023 12:12   US Abdomen Limited RUQ (LIVER/GB)  Result Date: 07/06/2023 CLINICAL DATA:  Transaminitis EXAM: ULTRASOUND ABDOMEN LIMITED RIGHT UPPER QUADRANT COMPARISON:  None Available. FINDINGS: Gallbladder: No gallstones or wall thickening visualized. No sonographic Murphy sign noted by sonographer. Common bile duct: Diameter: 0.3 cm Liver: No focal lesion identified. Increased parenchymal echogenicity. Portal vein is patent on color Doppler imaging with normal direction of blood flow towards the liver. Other: None. IMPRESSION: 1. Hepatic steatosis. 2. Otherwise unremarkable right upper quadrant ultrasound. Electronically Signed   By: Jearld Lesch M.D.   On: 07/06/2023 14:47    Assessment  52 y.o. female with a history of HTN and alcohol abuse who presenting to ED with N/V, body aches,  found to be mildly febrile at 100.2, also with hypokalemia, mildly elevated LFTs and bilirubin.  RUQ ultrasound with hepatic steatosis.  She was admitted on CIWA protocol.  Ultimately developed severe alcohol withdrawal, DTs, and started on Precedex on 7/18 (now discontinued). GI now following due to small bowel and colonic dilatation.  Flex sig 7/23 for decompression noting erythema in transverse colon, dilated descending and transverse colon s/p fluid aspiration. Stool in rectum, sigmoid, descending, and transverse. NG tube had been in place and surgery following;  Surgery has ordered NG tube to be removed. CT yesterday with mild diffuse dilatation of small bowel and colon, most likely due to ileus. Remains on TPN. Noted also poorly defined low-attenuating lesion involving majority of the spleen. Differentials including laceration/hematoma, infarct, phlegmon, doubt neoplasm as this was not evident  on prior imaging. IR has evaluated and not amenable to drainage. Antibiotics have been tailored per pharmacy and Surgery recommending reimaging of spleen later this week to evaluate for interval changes.   Recommend continuing bowel regimen. No additional GI recommendations at this time.   Plan / Recommendations  Appreciate Surgery following Continue bowel regimen Will need outpatient colonoscopy and EGD (distal esophageal wall thickening also seen on CT on admission)    LOS: 14 days    07/19/2023, 7:25 AM  Gelene Mink, PhD, ANP-BC [facility]

## 2023-07-19 NOTE — Progress Notes (Signed)
Interventional Radiology Brief Note:  Patient case reviewed with Dr. Deanne Coffer this AM.  CT shows phlegmon, inflammatory change, but no fluid collection amenable to drainage.  No plans for procedure in IR at this time.  Ordering MD notified.   Loyce Dys, MS RD PA-C 9:22 AM

## 2023-07-19 NOTE — Plan of Care (Signed)
  Problem: Acute Rehab OT Goals (only OT should resolve) Goal: Pt. Will Perform Grooming Flowsheets (Taken 07/19/2023 0908) Pt Will Perform Grooming:  with modified independence  sitting Goal: Pt. Will Perform Upper Body Dressing Flowsheets (Taken 07/19/2023 0908) Pt Will Perform Upper Body Dressing:  with modified independence  sitting Goal: Pt. Will Perform Lower Body Dressing Flowsheets (Taken 07/19/2023 0908) Pt Will Perform Lower Body Dressing:  sitting/lateral leans  with min guard assist Goal: Pt. Will Perform Toileting-Clothing Manipulation Flowsheets (Taken 07/19/2023 0908) Pt Will Perform Toileting - Clothing Manipulation and hygiene:  with modified independence  sitting/lateral leans Goal: Pt/Caregiver Will Perform Home Exercise Program Flowsheets (Taken 07/19/2023 0908) Pt/caregiver will Perform Home Exercise Program:  Increased ROM  Increased strength  Both right and left upper extremity  Independently  Trishna Cwik OT, MOT

## 2023-07-19 NOTE — Progress Notes (Addendum)
PROGRESS NOTE  Heidi [name]:[id number] DOB: 1971/08/14 DOA: 07/05/2023 PCP: Rica Records, FNP  Brief History:   52 y.o. female with medical history significant of hypertension, alcohol abuse, tobacco use disorder who presents to the emergency department from home via EMS due to complaints of generalized bodyaches, nausea, vomiting which she thinks was related to alcohol withdrawal. Patient usually drinks 4 40 ounce beers daily, but she only had 2 12 ounce beers today.  Patient complained of pain in chest, legs, arms and endorsed having had some falls without any injury.  Her hospitalization has been prolonged secondary to delirium and agitation presumably from alcohol withdrawal.  She has been on Precedex since 07/07/2023.  Attempts at weaning had resulted in increased agitation.  Unfortunately, her hospitalization has been prolonged secondary to development of ileus.  NG was inserted to low intermittent suction on 07/14/2023.  PICC line placed on 07/15/23 and TPN was initiated due to prolonged period of no oral intake and prolonged ileus.   On 07/16/23, pt began showing signs of return of bowel function with 3 BMs in last 24 hours.  She developed a fever up to 102.2.  UA was negative.  Blood cultures were drawn and pt started on empiric unasyn  On 07/17/2023 in the late morning, the patient's Precedex was weaned off.  The patient remained fairly lucid and redirectable. She had one BM in last 24 hours.  TPN continues  On 07/10/2023, the patient continued to have fever up to 101.3 F.  She has had 2 bowel movements in last 24 hours.  She had NG output of 1150, but abdomen was less distended.   Assessment/Plan: Acute metabolic encephalopathy/delirium tremens -Patient has been on Precedex and 07/07/2023 -Attempts at weaning has resulted in increased agitation -Unfortunately, the patient's ileus has limited oral medications to help mitigate her agitation -Continue IV Precedex for  now>>weaned off 7/27 AM -Librium was discontinued -received high-dose thiamine  -07/17/23 Repeat UA 0-5WBC -B12--1649 -Ammonia--25 -Folic acid--8.7 -TSH--2.866 -UDS positive for benzodiazepine -07/14/23--add clonidine TTS-1>>TTS-2 -07/17/23--weaned off precedex around noon   Abdominal ileus -07/12/2023 CT AP--multiple dilated fluid-filled loops of SB and colon -Appreciate GI>>possible neostigmine -07/13/2023 flex sig--no obstruction noted--erythematous mucosa in the transverse colon with dilated in the descending, transverse: -07/14/2023--NG inserted to low intermittent suction -Appreciate general surgery and GI assistance -Remain n.p.o. -NG output remains--about 200-400 output each shift -passing flatus, had 2 BM last 24 hours -continue LIS>>clamp -optimize electrolytes -Overall appears to be improving>> remove NG 07/19/2023 -07/18/2023 CT abdomen shows improving ileus -Check C. difficile  Splenic phlegmon -Case discussed with general surgery, Dr. Robyne Peers -Case discussed with IR, Dr. Deanne Coffer -Primary considerations at this time--hemorrhage versus infection -Broaden antibiotic coverage for the next few days -Plan to repeat CT abdomen pelvis after 3 to 4 days of antibiotics if no improvement clinically to help clarify etiology   Fever -developed fever 102.2 in am 7/26 -fever 101.2 am 7/28 -blood cultures--neg to date -UA neg for pyuria -7/25 CXR personally reviewed--increased interstitial markings -KUB--personally reviewed--persistent bowel dilatation, unchanged -PCT 0.11 -lactic acid 0.8 -Broaden antibiotics to vancomycin -CT chest/ AP--Heterogeneous and poorly defined low-attenuation lesion involving the majority of the spleen. Differential diagnosis includes splenic laceration/hematoma, infarct, phlegmon/developing abscess, or less likely neoplasm.   Atrial tachycardia -7/27 personally reviewed EKG--atrial tach -increased metoprolol to 7.5 mg q 6 hours -Start diltiazem  drip   FEN -Replace potassium>>give additional IV KCl -7/25 PICC line for TPN  initiation   Essential hypertension -Irbesartan and diltiazem were initially discontinued secondary to low blood pressure -Patient is now hypertensive again -clonidine TTS-1 started 7/24>>increased to TTS-2 on 7/27 PM -continue IV lopressor>>increased to 7.5 mg q 6   Thrombocytopenia -Secondary to myelosuppression from alcohol use -improved and stable   Transaminasemia -due to Etoh -improved   Tobacco cessation -NicoDerm patch -Cessation discussed   Hypokalemia -Replete -Check magnesium 1.9                 Family Communication:   Family at bedside updated 7/27   Consultants:  PCCM   Code Status:  FULL    DVT Prophylaxis:  SCDs     Procedures: As Listed in Progress Note Above   Antibiotics: Unasyn 7/26>>7/28 Vanc 7/28>> Zosyn 7/28>>       Subjective: Pt is somnolent but intermittently answers yes/no.  Denies f/c, cp, sob.  Has abd pain.  Remainder ROS not obtainagle  Objective: Vitals:   07/19/23 1100 07/19/23 1130 07/19/23 1200 07/19/23 1230  BP: 121/78 111/82 126/76 (!) 136/90  Pulse:      Resp: (!) 24  (!) 21 20  Temp:  98.4 F (36.9 C)    TempSrc:  Axillary    SpO2:      Weight:      Height:        Intake/Output Summary (Last 24 hours) at 07/19/2023 1239 Last data filed at 07/19/2023 0600 Gross per 24 hour  Intake 3897.97 ml  Output 4350 ml  Net -452.03 ml   Weight change:  Exam:  General:  Pt is alert, follows commands appropriately, not in acute distress HEENT: No icterus, No thrush, No neck mass, [place]/AT Cardiovascular: RRR, S1/S2, no rubs, no gallops Respiratory: bibasilar rales. No wheeze Abdomen: Soft/+BS, upper tender, non distended, no guarding Extremities: No edema, No lymphangitis, No petechiae, No rashes, no synovitis   Data Reviewed: I have personally reviewed following labs and imaging studies Basic Metabolic Panel: Recent Labs  Lab  07/15/23 0600 07/16/23 0604 07/17/23 0325 07/18/23 0430 07/19/23 0402  NA 145 141 136 136 130*  K 3.4* 3.5 3.0* 3.6 4.1  CL 115* 114* 108 108 103  CO2 20* 19* 22 25 21*  GLUCOSE 85 136* 134* 155* 174*  BUN 14 10 7 7 8   CREATININE 0.74 0.64 0.45 0.49 0.45  CALCIUM 9.1 9.0 9.0 9.0 9.7  MG 2.0 2.1 1.9 1.9 1.9  PHOS 2.9 3.0 3.6 4.5  --    Liver Function Tests: Recent Labs  Lab 07/14/23 0456 07/15/23 0600 07/16/23 0604 07/18/23 0430 07/19/23 0402  AST 21 29 28 27 30   ALT 19 19 16 17 25   ALKPHOS 53 75 80 76 88  BILITOT 0.8 1.0 0.7 0.6 0.7  PROT 6.0* 6.4* 6.3* 6.2* 7.0  ALBUMIN 2.8* 2.9* 2.8* 2.6* 3.0*   No results for input(s): "LIPASE", "AMYLASE" in the last 168 hours. Recent Labs  Lab 07/15/23 0600  AMMONIA 25   Coagulation Profile: No results for input(s): "INR", "PROTIME" in the last 168 hours. CBC: Recent Labs  Lab 07/15/23 0600 07/16/23 0604 07/17/23 1135 07/18/23 0430 07/19/23 0402  WBC 15.8* 15.6* 17.4* 17.2* 19.3*  HGB 10.7* 10.6* 9.1* 8.5* 9.5*  HCT 33.4* 32.7* 26.2* 25.4* 28.6*  MCV 93.0 92.9 88.5 89.8 90.2  PLT 215 174 157 173 218   Cardiac Enzymes: No results for input(s): "CKTOTAL", "CKMB", "CKMBINDEX", "TROPONINI" in the last 168 hours. BNP: Invalid input(s): "POCBNP" CBG: Recent Labs  Lab 07/18/23  0347 07/18/23 1128 07/18/23 1853 07/18/23 2322 07/19/23 0649  GLUCAP 134* 143* 150* 151* 178*   HbA1C: No results for input(s): "HGBA1C" in the last 72 hours. Urine analysis:    Component Value Date/Time   COLORURINE YELLOW 07/17/2023 1745   APPEARANCEUR CLEAR 07/17/2023 1745   LABSPEC 1.010 07/17/2023 1745   PHURINE 7.0 07/17/2023 1745   GLUCOSEU NEGATIVE 07/17/2023 1745   HGBUR NEGATIVE 07/17/2023 1745   BILIRUBINUR NEGATIVE 07/17/2023 1745   KETONESUR NEGATIVE 07/17/2023 1745   PROTEINUR NEGATIVE 07/17/2023 1745   UROBILINOGEN 0.2 01/04/2012 1330   NITRITE NEGATIVE 07/17/2023 1745   LEUKOCYTESUR NEGATIVE 07/17/2023 1745    Sepsis Labs: @LABRCNTIP (procalcitonin:4,lacticidven:4) ) Recent Results (from the past 240 hour(s))  Culture, blood (Routine X 2) w Reflex to ID Panel     Status: None (Preliminary result)   Collection Time: 07/15/23 10:08 AM   Specimen: BLOOD RIGHT HAND  Result Value Ref Range Status   Specimen Description BLOOD RIGHT HAND  Final   Special Requests   Final    BOTTLES DRAWN AEROBIC AND ANAEROBIC Blood Culture adequate volume   Culture   Final    NO GROWTH 4 DAYS Performed at [facility], [address], [place], [place] [address]    Report Status PENDING  Incomplete  Culture, blood (Routine X 2) w Reflex to ID Panel     Status: None (Preliminary result)   Collection Time: 07/15/23 10:08 AM   Specimen: BLOOD  Result Value Ref Range Status   Specimen Description BLOOD BLOOD LEFT HAND  Final   Special Requests   Final    BOTTLES DRAWN AEROBIC AND ANAEROBIC Blood Culture adequate volume   Culture   Final    NO GROWTH 4 DAYS Performed at [facility], [address], [place], [place] [address]    Report Status PENDING  Incomplete  Culture, blood (Routine X 2) w Reflex to ID Panel     Status: None (Preliminary result)   Collection Time: 07/18/23  6:38 PM   Specimen: BLOOD LEFT FOREARM  Result Value Ref Range Status   Specimen Description BLOOD LEFT FOREARM  Final   Special Requests   Final    BOTTLES DRAWN AEROBIC AND ANAEROBIC Blood Culture results may not be optimal due to an excessive volume of blood received in culture bottles   Culture   Final    NO GROWTH < 12 HOURS Performed at [facility], [address] [place], [place] [address]    Report Status PENDING  Incomplete  Culture, blood (Routine X 2) w Reflex to ID Panel     Status: None (Preliminary result)   Collection Time: 07/18/23  6:47 PM   Specimen: BLOOD LEFT HAND  Result Value Ref Range Status   Specimen Description BLOOD LEFT HAND  Final   Special Requests   Final    BOTTLES DRAWN AEROBIC AND ANAEROBIC BLOOD  LEFT HAND   Culture   Final    NO GROWTH < 12 HOURS Performed at [facility], [address], [place], [place] [address]    Report Status PENDING  Incomplete     Scheduled Meds:  bisacodyl  10 mg Rectal Daily   Chlorhexidine Gluconate Cloth  6 each Topical Daily   cloNIDine  0.2 mg Transdermal Weekly   folic acid  1 mg Intravenous Daily   insulin aspart  0-15 Units Subcutaneous Q6H   metoprolol tartrate  7.5 mg Intravenous Q6H   nicotine  14 mg Transdermal Daily  polyethylene glycol  17 g Oral Daily   senna-docusate  1 tablet Oral BID   sodium chloride flush  10-40 mL Intracatheter Q12H   thiamine (VITAMIN B1) injection  100 mg Intravenous Q24H   Continuous Infusions:  dexmedetomidine (PRECEDEX) IV infusion Stopped (07/17/23 1136)   diltiazem (CARDIZEM) infusion 15 mg/hr (07/18/23 2309)   piperacillin-tazobactam (ZOSYN)  IV 3.375 g (07/19/23 0618)   TPN ADULT (ION) 80 mL/hr at 07/18/23 1745   TPN ADULT (ION)     vancomycin      Procedures/Studies: CT ABDOMEN PELVIS W CONTRAST  Result Date: 07/18/2023 CLINICAL DATA:  Fever. Respiratory illness. Abdominal pain. Ileus. Colitis. EXAM: CT CHEST, ABDOMEN, AND PELVIS WITH CONTRAST TECHNIQUE: Multidetector CT imaging of the chest, abdomen and pelvis was performed following the standard protocol during bolus administration of intravenous contrast. RADIATION DOSE REDUCTION: This exam was performed according to the departmental dose-optimization program which includes automated exposure control, adjustment of the mA and/or kV according to patient size and/or use of iterative reconstruction technique. CONTRAST:  OMNIPAQUE IOHEXOL 300 MG/ML  SOLN COMPARISON:  Noncontrast AP CT on 07/12/2023 FINDINGS: CT CHEST FINDINGS Cardiovascular: No acute findings. Mediastinum/Lymph Nodes: No masses or pathologically enlarged lymph nodes identified. Lungs/Pleura: No suspicious pulmonary nodules or masses identified. No No evidence of pulmonary  consolidation or central endobronchial obstruction. Small left pleural effusion noted as well as mild dependent atelectasis. Musculoskeletal:  No suspicious bone lesions identified. CT ABDOMEN AND PELVIS FINDINGS Hepatobiliary: No masses identified. Gallbladder is unremarkable. No evidence of biliary ductal dilatation. Pancreas:  No mass or inflammatory changes. Spleen: Within normal limits in size. Heterogeneous and poorly defined low-attenuation lesion is seen involving the majority of the spleen, without perisplenic fluid. Differential diagnosis includes splenic laceration, infarct, abscess, or less likely neoplasm. Adrenals/Urinary tract: No suspicious masses or hydronephrosis. Stomach/Bowel: Nasogastric tube tip in body of stomach. Mild diffuse dilatation of small bowel and nondependent colon, most likely due to ileus. No evidence of obstruction, inflammatory process, or abnormal fluid collections. Normal appendix visualized. Vascular/Lymphatic: No pathologically enlarged lymph nodes identified. No acute vascular findings. Reproductive: No mass or inflammatory changes identified. Small amount of low-attenuation free fluid seen in pelvic cul-de-sac. Other:  None. Musculoskeletal:  No suspicious bone lesions identified. IMPRESSION: Heterogeneous and poorly defined low-attenuation lesion involving the majority of the spleen. Differential diagnosis includes splenic laceration/hematoma, infarct, phlegmon/developing abscess, or less likely neoplasm. Mild diffuse dilatation of small bowel and nondependent colon, most likely due to ileus. Small left pleural effusion and free pelvic fluid. Critical Value/emergent results were called by telephone at the time of interpretation on 07/18/2023 at 4:46 pm to provider Alyan Hartline , who verbally acknowledged these results. Electronically Signed   By: Danae Orleans M.D.   On: 07/18/2023 17:04   CT CHEST W CONTRAST  Result Date: 07/18/2023 CLINICAL DATA:  Fever. Respiratory  illness. Abdominal pain. Ileus. Colitis. EXAM: CT CHEST, ABDOMEN, AND PELVIS WITH CONTRAST TECHNIQUE: Multidetector CT imaging of the chest, abdomen and pelvis was performed following the standard protocol during bolus administration of intravenous contrast. RADIATION DOSE REDUCTION: This exam was performed according to the departmental dose-optimization program which includes automated exposure control, adjustment of the mA and/or kV according to patient size and/or use of iterative reconstruction technique. CONTRAST:  OMNIPAQUE IOHEXOL 300 MG/ML  SOLN COMPARISON:  Noncontrast AP CT on 07/12/2023 FINDINGS: CT CHEST FINDINGS Cardiovascular: No acute findings. Mediastinum/Lymph Nodes: No masses or pathologically enlarged lymph nodes identified. Lungs/Pleura: No suspicious pulmonary nodules or masses  identified. No No evidence of pulmonary consolidation or central endobronchial obstruction. Small left pleural effusion noted as well as mild dependent atelectasis. Musculoskeletal:  No suspicious bone lesions identified. CT ABDOMEN AND PELVIS FINDINGS Hepatobiliary: No masses identified. Gallbladder is unremarkable. No evidence of biliary ductal dilatation. Pancreas:  No mass or inflammatory changes. Spleen: Within normal limits in size. Heterogeneous and poorly defined low-attenuation lesion is seen involving the majority of the spleen, without perisplenic fluid. Differential diagnosis includes splenic laceration, infarct, abscess, or less likely neoplasm. Adrenals/Urinary tract: No suspicious masses or hydronephrosis. Stomach/Bowel: Nasogastric tube tip in body of stomach. Mild diffuse dilatation of small bowel and nondependent colon, most likely due to ileus. No evidence of obstruction, inflammatory process, or abnormal fluid collections. Normal appendix visualized. Vascular/Lymphatic: No pathologically enlarged lymph nodes identified. No acute vascular findings. Reproductive: No mass or inflammatory changes  identified. Small amount of low-attenuation free fluid seen in pelvic cul-de-sac. Other:  None. Musculoskeletal:  No suspicious bone lesions identified. IMPRESSION: Heterogeneous and poorly defined low-attenuation lesion involving the majority of the spleen. Differential diagnosis includes splenic laceration/hematoma, infarct, phlegmon/developing abscess, or less likely neoplasm. Mild diffuse dilatation of small bowel and nondependent colon, most likely due to ileus. Small left pleural effusion and free pelvic fluid. Critical Value/emergent results were called by telephone at the time of interpretation on 07/18/2023 at 4:46 pm to provider Lashaun Krapf , who verbally acknowledged these results. Electronically Signed   By: Danae Orleans M.D.   On: 07/18/2023 17:04   DG Abd 1 View  Result Date: 07/18/2023 CLINICAL DATA:  Ileus EXAM: ABDOMEN - 1 VIEW COMPARISON:  07/16/2023 FINDINGS: NG tube remains positioned within the stomach. Decreasing size and number of dilated small bowel loops. Small bowel dilation now measures up to 4.4 cm (previously 5.4 cm). Persistent gaseous distension of the transverse colon. No gross free intraperitoneal air. IMPRESSION: Decreasing size and number of dilated small bowel loops. Appearance suggests resolving ileus or obstruction. Continued radiographic follow-up is recommended. Electronically Signed   By: Duanne Guess D.O.   On: 07/18/2023 09:42   DG Abd 1 View  Result Date: 07/16/2023 CLINICAL DATA:  Ileus, abdominal distention EXAM: ABDOMEN - 1 VIEW COMPARISON:  Previous studies including the examination of 07/15/2023 FINDINGS: There are dilated small bowel loops measuring up to 5.4 cm in diameter. Small-bowel dilation appears slightly more prominent. Mucosal folds in the small bowel loops appear prominent. Gas is present in right colon. Tip of NG tube is seen in the stomach. IMPRESSION: There is dilation of small-bowel loops along with mucosal thickening. Findings may suggest  ileus or enteritis or distal colonic obstruction. Electronically Signed   By: Ernie Avena M.D.   On: 07/16/2023 11:41   DG Abd 1 View  Result Date: 07/15/2023 CLINICAL DATA:  [id number] Ileus [facility]) [id number] EXAM: ABDOMEN - 1 VIEW COMPARISON:  07/14/2023 FINDINGS: Enteric tube remains positioned within the stomach. Persistently dilated loops of small bowel within the abdomen, similar to prior. No gross free intraperitoneal air on supine view. IMPRESSION: Persistently dilated loops of small bowel within the abdomen, ileus versus obstruction. Electronically Signed   By: Duanne Guess D.O.   On: 07/15/2023 13:17   DG CHEST PORT 1 VIEW  Result Date: 07/15/2023 CLINICAL DATA:  Fever EXAM: PORTABLE CHEST 1 VIEW COMPARISON:  07/09/2023 FINDINGS: New enteric tube which extends into the stomach. The heart size and mediastinal contours are within normal limits. Both lungs are clear. The visualized skeletal structures are unremarkable. Gaseous distension of  bowel within the imaged upper abdomen. IMPRESSION: 1. No active cardiopulmonary disease. 2. New enteric tube which extends into the stomach. Electronically Signed   By: Duanne Guess D.O.   On: 07/15/2023 13:15   [place] EKG SITE RITE  Result Date: 07/14/2023 If Site Rite image not attached, placement could not be confirmed due to current cardiac rhythm.  DG Abd Portable 1V  Result Date: 07/14/2023 CLINICAL DATA:  NG tube placement EXAM: PORTABLE ABDOMEN - 1 VIEW COMPARISON:  Previous studies including the examination none earlier today FINDINGS: There is interval placement of NG tube with its tip and side port within the stomach. Dilation of small-bowel loops in central abdomen has not changed. Gas is present in ascending and transverse colon. IMPRESSION: Tip of enteric tube is seen in the stomach. No change is seen in small bowel dilation. Electronically Signed   By: Ernie Avena M.D.   On: 07/14/2023 13:11   DG Abd 1 View  Result Date:  07/14/2023 CLINICAL DATA:  Abdominal distention EXAM: ABDOMEN - 1 VIEW COMPARISON:  Previous studies including the examination of 07/13/2023 FINDINGS: There is dilation of small-bowel loops in upper and mid abdomen. Gas is present in transverse colon. There is no demonstrable gas in rectosigmoid. No significant changes are noted in small bowel dilation. IMPRESSION: There is dilation of small bowel loops suggesting possible ileus or partial obstruction. No significant changes are noted. Electronically Signed   By: Ernie Avena M.D.   On: 07/14/2023 13:09   DG Abd 1 View  Result Date: 07/13/2023 CLINICAL DATA:  Follow-up ileus, initial encounter EXAM: ABDOMEN - 1 VIEW COMPARISON:  None Available. FINDINGS: Scattered large and small bowel gas is noted. Multiple dilated loops of small bowel are again noted similar to that seen on the prior exam. No free air is seen. No mass lesion is noted. No bony abnormality is seen. IMPRESSION: Stable small bowel dilatation centrally. Electronically Signed   By: Alcide Clever M.D.   On: 07/13/2023 20:09   DG Abd 1 View  Result Date: 07/13/2023 CLINICAL DATA:  Abdominal distension. EXAM: ABDOMEN - 1 VIEW COMPARISON:  07/12/2023 FINDINGS: Persistent gaseous distension of bowel loops throughout the abdomen and pelvis. The degree of bowel distension has minimally changed. Upper abdomen is incompletely imaged and not clear if nasogastric tube is still present. Cannot evaluate for free air on the supine image. IMPRESSION: Persistent gaseous distension of small and large bowel loops. Based on the previous CT findings, a distal colonic obstruction cannot be excluded. Bowel gas distension has minimally changed. Electronically Signed   By: Richarda Overlie M.D.   On: 07/13/2023 16:33   DG Abd 1 View  Result Date: 07/12/2023 CLINICAL DATA:  Nasogastric tube placement. EXAM: ABDOMEN - 1 VIEW COMPARISON:  Radiograph and CT earlier today FINDINGS: Tip and side port of the enteric tube  below the diaphragm in the stomach. Gaseous bowel distension persists throughout the abdomen. IMPRESSION: Tip and side port of the enteric tube below the diaphragm in the stomach. Electronically Signed   By: Narda Rutherford M.D.   On: 07/12/2023 17:06   CT ABDOMEN PELVIS WO CONTRAST  Result Date: 07/12/2023 CLINICAL DATA:  Abdominal pain, acute. EXAM: CT ABDOMEN AND PELVIS WITHOUT CONTRAST TECHNIQUE: Multidetector CT imaging of the abdomen and pelvis was performed following the standard protocol without IV contrast. RADIATION DOSE REDUCTION: This exam was performed according to the departmental dose-optimization program which includes automated exposure control, adjustment of the mA and/or kV according to  patient size and/or use of iterative reconstruction technique. COMPARISON:  Abdominal radiographs 07/12/2023. Abdominal ultrasound 07/06/2023. FINDINGS: Lower chest: Trace bilateral pleural effusions with associated dependent atelectasis at both lung bases. No consolidation or significant pericardial fluid. There is moderate distal esophageal wall thickening which is nonspecific. Hepatobiliary: No suspicious hepatic findings are identified on noncontrast imaging. In the dome of the left hepatic lobe, there is a 9 mm low-density lesion which is likely a cyst. No evidence of gallstones, gallbladder wall thickening or biliary dilatation. Pancreas: Unremarkable. No pancreatic ductal dilatation or surrounding inflammatory changes. Spleen: Normal in size without focal abnormality. Adrenals/Urinary Tract: Both adrenal glands appear normal. No evidence of urinary tract calculus, suspicious renal lesion or hydronephrosis. Mild nonspecific perinephric soft tissue stranding bilaterally. The bladder appears unremarkable for its degree of distention. Stomach/Bowel: There may be a minimal amount of enteric contrast dependently in the proximal stomach. The stomach and proximal small bowel are not significantly distended.  However, as seen on earlier radiographs, there are multiple dilated and fluid-filled loops of mid to distal small bowel and colon. The colon is dilated into the mid descending colon. The sigmoid colon and rectum are relatively decompressed, although did no focal transition point or obvious colonic mass is identified on this noncontrast study. There is no bowel wall thickening or pneumatosis. There is some high density material within the lumen of the appendix, and the appendix appears mildly distended, although demonstrates no wall thickening or focal surrounding inflammation. A rectal tube is in place. Vascular/Lymphatic: There are no enlarged abdominal or pelvic lymph nodes. Aortic and branch vessel atherosclerosis without evidence of aneurysm. Vascular patency not addressed without contrast. Reproductive: The uterus and ovaries appear normal. No adnexal mass. Other: Moderate amount of low-density ascites. There is soft tissue stranding throughout the mesenteric fat. Possible peritoneal nodularity in the right mid abdomen measuring up to 2.3 x 1.2 cm on image 44/2. No pneumoperitoneum or abdominal wall hernia identified. Musculoskeletal: No acute or significant osseous findings. Unless specific follow-up recommendations are mentioned in the findings or impression sections, no imaging follow-up of any mentioned incidental findings is recommended. IMPRESSION: 1. Multiple dilated and fluid-filled loops of mid to distal small bowel and colon with relative decompression of the sigmoid colon and rectum. No focal transition point or obvious colonic mass is identified on this study which is limited by the lack of intravenous and enteric contrast. Findings could be secondary to an ileus from underlying peritonitis, although are concerning for possible distal colonic obstruction. Recommend further evaluation with colonoscopy. 2. Moderate amount of low-density ascites with soft tissue stranding throughout the mesenteric fat  and possible peritoneal nodularity in the right mid abdomen. No evidence of bowel perforation. Cannot exclude peritoneal carcinomatosis. 3. Mildly prominent appendix without evidence of acute inflammation. 4. Trace bilateral pleural effusions with associated dependent atelectasis at both lung bases. 5. Distal esophageal wall thickening, nonspecific, but potentially related to esophagitis. 6.  Aortic Atherosclerosis (ICD10-I70.0). Electronically Signed   By: Carey Bullocks M.D.   On: 07/12/2023 13:24   DG Abd 1 View  Result Date: 07/12/2023 CLINICAL DATA:  Abdominal pain EXAM: ABDOMEN - 1 VIEW COMPARISON:  None Available. FINDINGS: Significantly dilated bowel loops. No definite free air on this limited supine radiograph. No abnormal calcifications identified although bowel loops limited assessment. Lower lumbar degenerative change. IMPRESSION: Significantly dilated bowel loops which could represent obstruction or ileus. Consider CT of the abdomen/pelvis for further evaluation. Electronically Signed   By: Juluis Mire.D.  On: 07/12/2023 08:31   DG CHEST PORT 1 VIEW  Result Date: 07/09/2023 CLINICAL DATA:  Cough, alcohol withdrawal. EXAM: PORTABLE CHEST 1 VIEW COMPARISON:  Chest radiograph 04/08/2023 FINDINGS: The cardiomediastinal silhouette is normal There is no focal consolidation or pulmonary edema. There is no pleural effusion or pneumothorax There is no acute osseous abnormality. IMPRESSION: No radiographic evidence of acute cardiopulmonary process. Electronically Signed   By: Lesia Hausen M.D.   On: 07/09/2023 12:12   US Abdomen Limited RUQ (LIVER/GB)  Result Date: 07/06/2023 CLINICAL DATA:  Transaminitis EXAM: ULTRASOUND ABDOMEN LIMITED RIGHT UPPER QUADRANT COMPARISON:  None Available. FINDINGS: Gallbladder: No gallstones or wall thickening visualized. No sonographic Murphy sign noted by sonographer. Common bile duct: Diameter: 0.3 cm Liver: No focal lesion identified. Increased parenchymal  echogenicity. Portal vein is patent on color Doppler imaging with normal direction of blood flow towards the liver. Other: None. IMPRESSION: 1. Hepatic steatosis. 2. Otherwise unremarkable right upper quadrant ultrasound. Electronically Signed   By: Jearld Lesch M.D.   On: 07/06/2023 14:47    Catarina Hartshorn, DO  [facility]  If 7PM-7AM, please contact night-coverage [email or web address] Password [facility] 07/19/2023, 12:39 PM   LOS: 14 days

## 2023-07-19 NOTE — Progress Notes (Signed)
Nutrition Follow-up  DOCUMENTATION CODES:   Not applicable  INTERVENTION:  - Continue TPN per pharmacy management.   NUTRITION DIAGNOSIS:   Inadequate oral intake related to inability to eat as evidenced by NPO status.  GOAL:   Patient will meet greater than or equal to 90% of their needs - Met with TPN.   MONITOR:   Diet advancement, Labs, Weight trends, I & O's  REASON FOR ASSESSMENT:   NPO/Clear Liquid Diet    ASSESSMENT:   52 year old female with PMHx of HTN, EtOH abuse, tobacco use disorder admitted with generalized body aches, nausea, emesis, EtOH withdrawal. Found to have acute metabolic encephalopathy/delirium tremens, abdominal ileus.  Meds reviewed: dulcolax, folic acid, sliding scale insulin, miralax, senokot, thiamine. Labs reviewed: Na low.   7/15: admitted to hospital with concern for EtOH withdrawal 7/16: ordered for heart healthy diet 7/17: escalated care to ICU and Precedex started for worsening agitation 7/22: CT abd/pelvis with multiple dilated fluid-filled loops of small bowel and colon; pt made NPO 7/23: PCCM consult for agitation and encephalopathy; s/p flex sig with no obstruction noted 7/24: NG tube inserted to LIS 7/25: TPN started   The pt remains NPO. Pt is oriented x2. TPN is meeting 100% of her needs with TPN.  NG tube remains in place to suction (1650 mL output in past 24 hrs). RD will continue to closely monitor POC and diet advancement.   Diet Order:   Diet Order             Diet NPO time specified Except for: Sips with Meds  Diet effective now                   EDUCATION NEEDS:   No education needs have been identified at this time  Skin:  Skin Assessment: Reviewed RN Assessment  Last BM:  7/27 - type 6  Height:   Ht Readings from Last 1 Encounters:  07/05/23 5\' 3"  (1.6 m)    Weight:   Wt Readings from Last 1 Encounters:  07/19/23 67.6 kg    Ideal Body Weight:  52.3 kg  BMI:  Body mass index is 26.4  kg/m.  Estimated Nutritional Needs:   Kcal:  1700-1900  Protein:  85-95 grams  Fluid:  1.7-1.9 L/day  Bethann Humble, RD, LDN, CNSC.

## 2023-07-19 NOTE — [place] FL2 (Signed)
[place] MEDICAID FL2 LEVEL OF CARE FORM     IDENTIFICATION  Patient Name: Heidi [name] Birthdate: [birth year] Sex: female Admission Date (Current Location): 07/05/2023  [facility] and [place] Number:  [employer] and Address:  [facility],  [address] [place] [address]      Provider Number: [id number]  Attending Physician Name and Address:  Catarina Hartshorn, MD  Relative Name and Phone Number:  Vinson Moselle)  [phone]    Current Level of Care: Hospital Recommended Level of Care: Skilled Nursing Facility Prior Approval Number:    Date Approved/Denied:   PASRR Number: [id number]  Discharge Plan: SNF    Current Diagnoses: Patient Active Problem List   Diagnosis Date Noted   Ileus (HCC) 07/13/2023   Elevated LFTs 07/12/2023   Colonic obstruction (HCC) 07/12/2023   Hypothermia 07/09/2023   Hypokalemia 07/06/2023   Thrombocytopenia (HCC) 07/06/2023   Chronic back pain 05/18/2023   Bilateral knee pain 04/26/2023   Depression, recurrent (HCC) 04/26/2023   Alcohol use disorder 04/20/2023   Carpal tunnel syndrome of right wrist 04/20/2023   Tachycardia 04/20/2023   Transaminitis 04/06/2023   GERD (gastroesophageal reflux disease) 04/06/2023   Tobacco use disorder 04/06/2023   Alcohol withdrawal (HCC) 10/19/2022   Essential hypertension 10/19/2022   Nausea & vomiting 10/19/2022   Hypomagnesemia 10/19/2022   CERVICAL RADICULOPATHY 10/29/2010    Orientation RESPIRATION BLADDER Height & Weight     Self, Place  Normal Continent Weight: 67.6 kg Height:  5\' 3"  (160 cm)  BEHAVIORAL SYMPTOMS/MOOD NEUROLOGICAL BOWEL NUTRITION STATUS      Continent Diet (See DC summary)  AMBULATORY STATUS COMMUNICATION OF NEEDS Skin   Extensive Assist Verbally Normal                       Personal Care Assistance Level of Assistance  Bathing, Feeding, Dressing Bathing Assistance: Maximum assistance Feeding assistance: Limited assistance Dressing  Assistance: Maximum assistance     Functional Limitations Info  Sight, Hearing, Speech Sight Info: Adequate Hearing Info: Adequate Speech Info: Adequate    SPECIAL CARE FACTORS FREQUENCY  PT (By licensed PT)     PT Frequency: 5 time a week              Contractures Contractures Info: Not present    Additional Factors Info  Code Status, Allergies Code Status Info: Full Allergies Info: NKDA           Current Medications (07/19/2023):  This is the current hospital active medication list Current Facility-Administered Medications  Medication Dose Route Frequency Provider Last Rate Last Admin   acetaminophen (TYLENOL) tablet 650 mg  650 mg Oral Q6H PRN Tat, David, MD       Or   acetaminophen (TYLENOL) suppository 650 mg  650 mg Rectal Q6H PRN Tat, Onalee Hua, MD       bisacodyl (DULCOLAX) suppository 10 mg  10 mg Rectal Daily Pappayliou, Catherine A, DO   10 mg at 07/19/23 0830   Chlorhexidine Gluconate Cloth 2 % PADS 6 each  6 each Topical Daily Adefeso, Oladapo, DO   6 each at 07/19/23 1108   cloNIDine (CATAPRES - Dosed in mg/24 hr) patch 0.2 mg  0.2 mg Transdermal Weekly Tat, David, MD   0.2 mg at 07/17/23 1816   cloNIDine (CATAPRES) tablet 0.1 mg  0.1 mg Oral Q8H PRN Tat, David, MD       dexmedetomidine (PRECEDEX) 400 MCG/100ML (4 mcg/mL) infusion  0-1.2 mcg/kg/hr Intravenous  Titrated Kendell Bane, MD   Stopped at 07/17/23 1136   diltiazem (CARDIZEM) 125 mg in dextrose 5% 125 mL (1 mg/mL) infusion  5-15 mg/hr Intravenous Continuous Tat, David, MD 15 mL/hr at 07/18/23 2309 15 mg/hr at 07/18/23 2309   folic acid injection 1 mg  1 mg Intravenous Daily Tat, David, MD   1 mg at 07/18/23 0839   hydrALAZINE (APRESOLINE) injection 10 mg  10 mg Intravenous Q6H PRN Adefeso, Oladapo, DO   10 mg at 07/18/23 2131   insulin aspart (novoLOG) injection 0-15 Units  0-15 Units Subcutaneous Q6H Tat, Onalee Hua, MD   3 Units at 07/19/23 1137   LORazepam (ATIVAN) tablet 1-4 mg  1-4 mg Oral Q4H PRN  Tat, Onalee Hua, MD   2 mg at 07/18/23 2131   Or   LORazepam (ATIVAN) injection 1-2 mg  1-2 mg Intravenous Q2H PRN Tat, Onalee Hua, MD   2 mg at 07/19/23 0453   metoprolol tartrate (LOPRESSOR) injection 7.5 mg  7.5 mg Intravenous Q6H Tat, Onalee Hua, MD   7.5 mg at 07/19/23 1137   nicotine (NICODERM CQ - dosed in mg/24 hours) patch 14 mg  14 mg Transdermal Daily Adefeso, Oladapo, DO   14 mg at 07/19/23 0829   ondansetron (ZOFRAN) tablet 4 mg  4 mg Oral Q6H PRN Tat, Onalee Hua, MD       Or   ondansetron (ZOFRAN) injection 4 mg  4 mg Intravenous Q6H PRN Tat, David, MD       oxyCODONE (Oxy IR/ROXICODONE) immediate release tablet 5 mg  5 mg Oral Q6H PRN Tat, David, MD   5 mg at 07/19/23 0830   piperacillin-tazobactam (ZOSYN) IVPB 3.375 g  3.375 g Intravenous Andres Labrum, MD 12.5 mL/hr at 07/19/23 0618 3.375 g at 07/19/23 0618   polyethylene glycol (MIRALAX / GLYCOLAX) packet 17 g  17 g Oral Daily Tat, David, MD       senna-docusate (Senokot-S) tablet 1 tablet  1 tablet Oral BID Tat, David, MD   1 tablet at 07/19/23 0830   sodium chloride flush (NS) 0.9 % injection 10-40 mL  10-40 mL Intracatheter Q12H Tat, Onalee Hua, MD   10 mL at 07/19/23 0831   sodium chloride flush (NS) 0.9 % injection 10-40 mL  10-40 mL Intracatheter PRN Tat, David, MD       thiamine (VITAMIN B1) injection 100 mg  100 mg Intravenous Q24H Catarina Hartshorn, MD   100 mg at 07/19/23 0831   TPN ADULT (ION)   Intravenous Continuous TPN Catarina Hartshorn, MD 80 mL/hr at 07/18/23 1745 New Bag at 07/18/23 1745   TPN ADULT (ION)   Intravenous Continuous TPN Earnie Larsson, RPH       traMADol Janean Sark) tablet 50 mg  50 mg Oral Q6H PRN Tat, Onalee Hua, MD       vancomycin (VANCOREADY) IVPB 1500 mg/300 mL  1,500 mg Intravenous Q24H Theotis Burrow I, [facility]         Discharge Medications: Please see discharge summary for a list of discharge medications.  Relevant Imaging Results:  Relevant Lab Results:   Additional Information SS# [id number]  Leitha Bleak,  RN

## 2023-07-19 NOTE — Evaluation (Signed)
Physical Therapy Evaluation Patient Details Name: Heidi [name] MRN: [id number] DOB: 01-28-1971 Today's Date: 07/19/2023  History of Present Illness  Heidi [name] is a 52 y.o. female with medical history significant of hypertension, alcohol abuse, tobacco use disorder who presents to the emergency department from home via EMS due to complaints of generalized bodyaches, nausea, vomiting which she thinks was related to alcohol withdrawal.  Patient usually drinks 4 40 ounce beers daily, but she only had 2 12 ounce beers today.  Patient complained of pain in chest, legs, arms and endorsed having had some falls without any injury.  She endorsed a left forearm rash which occurred after being bitten by a yellowjacket on Saturday (2 days ago).   Clinical Impression  Patient demonstrates slow labored movement for sitting up at bedside with frequent posterior leaning once seated, very unsteady on feet and limited to a few side steps before having to sit due to c/o fatigue and generalized weakness.  Patient tolerated sitting up in chair after therapy - RN aware.  Patient will benefit from continued skilled physical therapy in hospital and recommended venue below to increase strength, balance, endurance for safe ADLs and gait.           If plan is discharge home, recommend the following: A lot of help with bathing/dressing/bathroom;A lot of help with walking and/or transfers;Help with stairs or ramp for entrance;Assistance with cooking/housework   Can travel by private vehicle   No    Equipment Recommendations Rolling walker (2 wheels)  Recommendations for Other Services       Functional Status Assessment Patient has had a recent decline in their functional status and demonstrates the ability to make significant improvements in function in a reasonable and predictable amount of time.     Precautions / Restrictions Precautions Precautions: Fall Restrictions Weight Bearing Restrictions: No       Mobility  Bed Mobility Overal bed mobility: Needs Assistance Bed Mobility: Supine to Sit     Supine to sit: Max assist     General bed mobility comments: increased time, labored movement, frequent falling backwards once seated    Transfers Overall transfer level: Needs assistance Equipment used: Rolling walker (2 wheels) Transfers: Sit to/from Stand, Bed to chair/wheelchair/BSC Sit to Stand: Mod assist, Max assist   Step pivot transfers: Mod assist, Max assist       General transfer comment: unsteady labored movement with posterior leaning    Ambulation/Gait Ambulation/Gait assistance: Mod assist, Max assist Gait Distance (Feet): 4 Feet Assistive device: Rolling walker (2 wheels) Gait Pattern/deviations: Decreased step length - right, Decreased step length - left, Decreased stride length, Knees buckling Gait velocity: slow     General Gait Details: limited to a few slow labored side steps requiring repeated verbal/tactile cueing for safetly due to leaning backwards  Stairs            Wheelchair Mobility     Tilt Bed    Modified Rankin (Stroke Patients Only)       Balance Overall balance assessment: Needs assistance Sitting-balance support: Feet supported, Bilateral upper extremity supported Sitting balance-Leahy Scale: Poor Sitting balance - Comments: seated at EOB Postural control: Posterior lean Standing balance support: Reliant on assistive device for balance, During functional activity, Bilateral upper extremity supported Standing balance-Leahy Scale: Poor Standing balance comment: using RW  Pertinent Vitals/Pain Pain Assessment Pain Assessment: Faces Faces Pain Scale: Hurts little more Pain Location: L shoulder; L side; back Pain Descriptors / Indicators: Discomfort, Grimacing Pain Intervention(s): Limited activity within patient's tolerance, Monitored during session, Repositioned    Home Living  Family/patient expects to be discharged to:: Private residence Living Arrangements: Parent Available Help at Discharge: Family;Available PRN/intermittently Type of Home: House Home Access: Ramped entrance       Home Layout: One level Home Equipment: None Additional Comments: Pt reported same history has prior.    Prior Function Prior Level of Function : Independent/Modified Independent             Mobility Comments: [place], does not drive ADLs Comments: Independent     Hand Dominance   Dominant Hand: Right    Extremity/Trunk Assessment   Upper Extremity Assessment Upper Extremity Assessment: Defer to OT evaluation    Lower Extremity Assessment Lower Extremity Assessment: Generalized weakness    Cervical / Trunk Assessment Cervical / Trunk Assessment: Normal  Communication   Communication: Expressive difficulties;Other (comment)  Cognition Arousal/Alertness: Awake/alert Behavior During Therapy: Impulsive Overall Cognitive Status: No family/caregiver present to determine baseline cognitive functioning                                 General Comments: Pt appears to be confused; wearing restraints at the start of the session.        General Comments      Exercises     Assessment/Plan    PT Assessment Patient needs continued PT services  PT Problem List Decreased strength;Decreased activity tolerance;Decreased balance;Decreased mobility       PT Treatment Interventions DME instruction;Gait training;Stair training;Functional mobility training;Therapeutic activities;Therapeutic exercise;Patient/family education;Balance training    PT Goals (Current goals can be found in the Care Plan section)  Acute Rehab PT Goals Patient Stated Goal: return home with family to assist PT Goal Formulation: With patient Time For Goal Achievement: 08/02/23 Potential to Achieve Goals: Good    Frequency Min 3X/week     Co-evaluation PT/OT/SLP  Co-Evaluation/Treatment: Yes Reason for Co-Treatment: To address functional/ADL transfers PT goals addressed during session: Mobility/safety with mobility;Balance;Proper use of DME OT goals addressed during session: ADL's and self-care       AM-PAC PT "6 Clicks" Mobility  Outcome Measure Help needed turning from your back to your side while in a flat bed without using bedrails?: A Lot Help needed moving from lying on your back to sitting on the side of a flat bed without using bedrails?: A Lot Help needed moving to and from a bed to a chair (including a wheelchair)?: A Lot Help needed standing up from a chair using your arms (e.g., wheelchair or bedside chair)?: A Lot Help needed to walk in hospital room?: A Lot Help needed climbing 3-5 steps with a railing? : Total 6 Click Score: 11    End of Session   Activity Tolerance: Patient tolerated treatment well;Patient limited by fatigue Patient left: in chair;with call bell/phone within reach;with chair alarm set Nurse Communication: Mobility status PT Visit Diagnosis: Unsteadiness on feet (R26.81);Other abnormalities of gait and mobility (R26.89);Muscle weakness (generalized) (M62.81)    Time: 1610-9604 PT Time Calculation (min) (ACUTE ONLY): 30 min   Charges:   PT Evaluation $PT Eval Moderate Complexity: 1 Mod PT Treatments $Therapeutic Activity: 23-37 mins PT General Charges $$ ACUTE PT VISIT: 1 Visit         11:47  AM, 07/19/23 [name], MPT Physical Therapist with [facility] [phone] office [phone] mobile phone

## 2023-07-19 NOTE — Evaluation (Signed)
Occupational Therapy Evaluation Patient Details Name: Heidi [name] MRN: [id number] DOB: 04-Mar-1971 Today's Date: 07/19/2023   History of Present Illness Heidi [name] is a 52 y.o. female with medical history significant of hypertension, alcohol abuse, tobacco use disorder who presents to the emergency department from home via EMS due to complaints of generalized bodyaches, nausea, vomiting which she thinks was related to alcohol withdrawal.  Patient usually drinks 4 40 ounce beers daily, but she only had 2 12 ounce beers today.  Patient complained of pain in chest, legs, arms and endorsed having had some falls without any injury.  She endorsed a left forearm rash which occurred after being bitten by a yellowjacket on Saturday (2 days ago).   Clinical Impression   Pt agreeable to OT and PT co-evaluation. Pt in restraints at start of session. Pt seemed confused with some expressive communication difficulties. Pt required max A for bed mobility and mod to max A for transfer. Max A for lower body ADL's at this time most likely based on pt's performance and attempt to remove her socks. Pt is generally weak. Pt will benefit from continued OT in the hospital and recommended venue below to increase strength, balance, and endurance for safe ADL's.         Recommendations for follow up therapy are one component of a multi-disciplinary discharge planning process, led by the attending physician.  Recommendations may be updated based on patient status, additional functional criteria and insurance authorization.   Assistance Recommended at Discharge Frequent or constant Supervision/Assistance  Patient can return home with the following A lot of help with walking and/or transfers;A lot of help with bathing/dressing/bathroom;Assistance with cooking/housework;Assist for transportation;Help with stairs or ramp for entrance;Direct supervision/assist for medications management    Functional Status Assessment   Patient has had a recent decline in their functional status and demonstrates the ability to make significant improvements in function in a reasonable and predictable amount of time.  Equipment Recommendations  None recommended by OT    Recommendations for Other Services       Precautions / Restrictions Precautions Precautions: Fall Restrictions Weight Bearing Restrictions: No      Mobility Bed Mobility Overal bed mobility: Needs Assistance Bed Mobility: Supine to Sit     Supine to sit: Max assist     General bed mobility comments: Much assist; very stiff.    Transfers Overall transfer level: Needs assistance Equipment used: Rolling walker (2 wheels) Transfers: Sit to/from Stand, Bed to chair/wheelchair/BSC Sit to Stand: Mod assist, Max assist     Step pivot transfers: Mod assist, Max assist     General transfer comment: labored movement; very unsteady.      Balance Overall balance assessment: Needs assistance Sitting-balance support: Bilateral upper extremity supported, Feet supported Sitting balance-Leahy Scale: Poor Sitting balance - Comments: seated at EOB Postural control: Posterior lean Standing balance support: Bilateral upper extremity supported, During functional activity, Reliant on assistive device for balance Standing balance-Leahy Scale: Poor Standing balance comment: using RW                           ADL either performed or assessed with clinical judgement   ADL Overall ADL's : Needs assistance/impaired Eating/Feeding: Minimal assistance;Sitting   Grooming: Minimal assistance;Moderate assistance;Sitting   Upper Body Bathing: Moderate assistance;Sitting   Lower Body Bathing: Maximal assistance;Sitting/lateral leans   Upper Body Dressing : Moderate assistance;Minimal assistance;Sitting   Lower Body Dressing: Maximal assistance;Sitting/lateral leans  Toilet Transfer: Moderate assistance;Maximal assistance;Stand-pivot;Rolling  walker (2 wheels) Toilet Transfer Details (indicate cue type and reason): Simulated via EOB to chair. Toileting- Clothing Manipulation and Hygiene: Maximal assistance;Bed level               Vision Patient Visual Report:  (Pt unable to report; unsure at this time) Additional Comments: assess/question pt at a later time; no obvious deficits today.                Pertinent Vitals/Pain Pain Assessment Pain Assessment: Faces Faces Pain Scale: Hurts little more Pain Location: L shoulder; L side; back Pain Descriptors / Indicators: Discomfort, Grimacing Pain Intervention(s): Limited activity within patient's tolerance, Monitored during session, Repositioned     Hand Dominance Right   Extremity/Trunk Assessment Upper Extremity Assessment Upper Extremity Assessment: Generalized weakness   Lower Extremity Assessment Lower Extremity Assessment: Defer to PT evaluation   Cervical / Trunk Assessment Cervical / Trunk Assessment: Normal   Communication Communication Communication: Expressive difficulties;Other (comment) (difficult to understnd pt at times today.)   Cognition Arousal/Alertness: Awake/alert Behavior During Therapy: Impulsive Overall Cognitive Status: No family/caregiver present to determine baseline cognitive functioning                                 General Comments: Pt appears to be confused; wearing restraints at the start of the session.                      Home Living Family/patient expects to be discharged to:: Private residence Living Arrangements: Parent Available Help at Discharge: Family;Available PRN/intermittently Type of Home: House Home Access: Ramped entrance     Home Layout: One level     Bathroom Shower/Tub: Chief Strategy Officer: Standard Bathroom Accessibility: Yes   Home Equipment: None   Additional Comments: Pt reported same history has prior.      Prior Functioning/Environment Prior Level  of Function : Independent/Modified Independent             Mobility Comments: [place], does not drive ADLs Comments: Independent        OT Problem List: Decreased strength;Decreased range of motion;Decreased activity tolerance;Impaired balance (sitting and/or standing);Decreased coordination;Decreased safety awareness;Decreased cognition      OT Treatment/Interventions: Self-care/ADL training;Therapeutic exercise;DME and/or AE instruction;Therapeutic activities;Patient/family education;Balance training;Visual/perceptual remediation/compensation    OT Goals(Current goals can be found in the care plan section) Acute Rehab OT Goals Patient Stated Goal: return home OT Goal Formulation: With patient Time For Goal Achievement: 08/02/23 Potential to Achieve Goals: Good  OT Frequency: Min 2X/week    Co-evaluation PT/OT/SLP Co-Evaluation/Treatment: Yes Reason for Co-Treatment: To address functional/ADL transfers   OT goals addressed during session: ADL's and self-care                       End of Session Equipment Utilized During Treatment: Rolling walker (2 wheels) Nurse Communication: Other (comment) (aware of transfer and pt location)  Activity Tolerance: Patient tolerated treatment well Patient left: in chair;with call bell/phone within reach  OT Visit Diagnosis: Unsteadiness on feet (R26.81);Other abnormalities of gait and mobility (R26.89);Muscle weakness (generalized) (M62.81)                Time: 2956-2130 OT Time Calculation (min): 17 min Charges:  OT General Charges $OT Visit: 1 Visit OT Evaluation $OT Eval Low Complexity: 1 Low  Albena Comes OT, MOT  Remi Deter  Oshua Mcconaha 07/19/2023, 9:06 AM

## 2023-07-20 ENCOUNTER — Telehealth: Payer: Self-pay | Admitting: Gastroenterology

## 2023-07-20 DIAGNOSIS — F10931 Alcohol use, unspecified with withdrawal delirium: Secondary | ICD-10-CM | POA: Diagnosis not present

## 2023-07-20 DIAGNOSIS — R112 Nausea with vomiting, unspecified: Secondary | ICD-10-CM | POA: Diagnosis not present

## 2023-07-20 DIAGNOSIS — F17200 Nicotine dependence, unspecified, uncomplicated: Secondary | ICD-10-CM | POA: Diagnosis not present

## 2023-07-20 DIAGNOSIS — R7401 Elevation of levels of liver transaminase levels: Secondary | ICD-10-CM | POA: Diagnosis not present

## 2023-07-20 LAB — CBC WITH DIFFERENTIAL/PLATELET
Abs Immature Granulocytes: 0.48 10*3/uL — ABNORMAL HIGH (ref 0.00–0.07)
Basophils Absolute: 0.1 10*3/uL (ref 0.0–0.1)
Basophils Relative: 1 %
Eosinophils Absolute: 0 10*3/uL (ref 0.0–0.5)
Eosinophils Relative: 0 %
HCT: 26.1 % — ABNORMAL LOW (ref 36.0–46.0)
Hemoglobin: 8.8 g/dL — ABNORMAL LOW (ref 12.0–15.0)
Immature Granulocytes: 4 %
Lymphocytes Relative: 10 %
Lymphs Abs: 1.2 10*3/uL (ref 0.7–4.0)
MCH: 30.7 pg (ref 26.0–34.0)
MCHC: 33.7 g/dL (ref 30.0–36.0)
MCV: 90.9 fL (ref 80.0–100.0)
Monocytes Absolute: 1.8 10*3/uL — ABNORMAL HIGH (ref 0.1–1.0)
Monocytes Relative: 15 %
Neutro Abs: 8.2 10*3/uL — ABNORMAL HIGH (ref 1.7–7.7)
Neutrophils Relative %: 70 %
Platelets: 218 10*3/uL (ref 150–400)
RBC: 2.87 MIL/uL — ABNORMAL LOW (ref 3.87–5.11)
RDW: 15.6 % — ABNORMAL HIGH (ref 11.5–15.5)
WBC: 11.6 10*3/uL — ABNORMAL HIGH (ref 4.0–10.5)
nRBC: 0 % (ref 0.0–0.2)

## 2023-07-20 LAB — GLUCOSE, CAPILLARY
Glucose-Capillary: 176 mg/dL — ABNORMAL HIGH (ref 70–99)
Glucose-Capillary: 203 mg/dL — ABNORMAL HIGH (ref 70–99)
Glucose-Capillary: 203 mg/dL — ABNORMAL HIGH (ref 70–99)
Glucose-Capillary: 204 mg/dL — ABNORMAL HIGH (ref 70–99)

## 2023-07-20 MED ORDER — FOLIC ACID 1 MG PO TABS
1.0000 mg | ORAL_TABLET | Freq: Every day | ORAL | Status: DC
  Administered 2023-07-21 – 2023-07-24 (×4): 1 mg via ORAL
  Filled 2023-07-20 (×4): qty 1

## 2023-07-20 MED ORDER — ALUM & MAG HYDROXIDE-SIMETH 200-200-20 MG/5ML PO SUSP
30.0000 mL | Freq: Four times a day (QID) | ORAL | Status: DC | PRN
  Administered 2023-07-20 – 2023-07-21 (×2): 30 mL via ORAL
  Filled 2023-07-20 (×2): qty 30

## 2023-07-20 MED ORDER — TRAVASOL 10 % IV SOLN
INTRAVENOUS | Status: AC
  Filled 2023-07-20: qty 921.6

## 2023-07-20 MED ORDER — SIMETHICONE 40 MG/0.6ML PO SUSP: 40.0000 mg | Freq: Four times a day (QID) | ORAL | Status: DC | PRN

## 2023-07-20 MED ORDER — MAGNESIUM CITRATE PO SOLN
1.0000 | Freq: Once | ORAL | Status: AC
  Administered 2023-07-20: 1 via ORAL
  Filled 2023-07-20: qty 296

## 2023-07-20 NOTE — Progress Notes (Signed)
Subjective: Feeling ok. Sitting up in the chair. Reports vomiting last night but tolerated clear liquids well today. No abdominal pain. Hasn't passed gas or had a BM. Working on [employer] citrate.   Objective: Vital signs in last 24 hours: Temp:  [99.1 F (37.3 C)-100.4 F (38 C)] 99.5 F (37.5 C) (07/30 0759) Pulse Rate:  [107-116] 116 (07/30 1100) Resp:  [20-31] 29 (07/29 2323) BP: (101-205)/(66-136) 126/67 (07/30 1000) SpO2:  [94 %-100 %] 100 % (07/30 1100) Weight:  [63.2 kg] 63.2 kg (07/30 0459) Last BM Date : 07/17/23 General:   Alert, pleasant, NAD.  Head:  Normocephalic and atraumatic. Eyes:  No icterus, sclera clear. Conjuctiva pink.  Abdomen:  Bowel sounds present, soft, mild distention, nontender.  No rebound or guarding. No masses appreciated  Extremities:  Without edema. Psych:  Normal mood and affect.  Intake/Output from previous day: 07/29 0701 - 07/30 0700 In: 1494.9 [I.V.:1104.2; IV Piggyback:390.7] Out: 1440 [Urine:1350; Emesis/NG output:90] Intake/Output this shift: Total I/O In: 2270.6 [P.O.:360; I.V.:1650.3; IV Piggyback:260.3] Out: 650 [Urine:650]  Lab Results: Recent Labs    07/18/23 0430 07/19/23 0402 07/20/23 0505  WBC 17.2* 19.3* 11.6*  HGB 8.5* 9.5* 8.8*  HCT 25.4* 28.6* 26.1*  PLT 173 218 218   BMET Recent Labs    07/18/23 0430 07/19/23 0402 07/20/23 0505  NA 136 130* 132*  K 3.6 4.1 4.5  CL 108 103 103  CO2 25 21* 25  GLUCOSE 155* 174* 180*  BUN 7 8 13   CREATININE 0.49 0.45 0.57  CALCIUM 9.0 9.7 9.3   LFT Recent Labs    07/18/23 0430 07/19/23 0402 07/20/23 0505  PROT 6.2* 7.0 6.4*  ALBUMIN 2.6* 3.0* 2.7*  AST 27 30 40  ALT 17 25 37  ALKPHOS 76 88 78  BILITOT 0.6 0.7 0.2*     Studies/Results: CT ABDOMEN PELVIS W CONTRAST  Result Date: 07/18/2023 CLINICAL DATA:  Fever. Respiratory illness. Abdominal pain. Ileus. Colitis. EXAM: CT CHEST, ABDOMEN, AND PELVIS WITH CONTRAST TECHNIQUE: Multidetector CT imaging of the  chest, abdomen and pelvis was performed following the standard protocol during bolus administration of intravenous contrast. RADIATION DOSE REDUCTION: This exam was performed according to the departmental dose-optimization program which includes automated exposure control, adjustment of the mA and/or kV according to patient size and/or use of iterative reconstruction technique. CONTRAST:  OMNIPAQUE IOHEXOL 300 MG/ML  SOLN COMPARISON:  Noncontrast AP CT on 07/12/2023 FINDINGS: CT CHEST FINDINGS Cardiovascular: No acute findings. Mediastinum/Lymph Nodes: No masses or pathologically enlarged lymph nodes identified. Lungs/Pleura: No suspicious pulmonary nodules or masses identified. No No evidence of pulmonary consolidation or central endobronchial obstruction. Small left pleural effusion noted as well as mild dependent atelectasis. Musculoskeletal:  No suspicious bone lesions identified. CT ABDOMEN AND PELVIS FINDINGS Hepatobiliary: No masses identified. Gallbladder is unremarkable. No evidence of biliary ductal dilatation. Pancreas:  No mass or inflammatory changes. Spleen: Within normal limits in size. Heterogeneous and poorly defined low-attenuation lesion is seen involving the majority of the spleen, without perisplenic fluid. Differential diagnosis includes splenic laceration, infarct, abscess, or less likely neoplasm. Adrenals/Urinary tract: No suspicious masses or hydronephrosis. Stomach/Bowel: Nasogastric tube tip in body of stomach. Mild diffuse dilatation of small bowel and nondependent colon, most likely due to ileus. No evidence of obstruction, inflammatory process, or abnormal fluid collections. Normal appendix visualized. Vascular/Lymphatic: No pathologically enlarged lymph nodes identified. No acute vascular findings. Reproductive: No mass or inflammatory changes identified. Small amount of low-attenuation free fluid seen in pelvic  cul-de-sac. Other:  None. Musculoskeletal:  No suspicious bone  lesions identified. IMPRESSION: Heterogeneous and poorly defined low-attenuation lesion involving the majority of the spleen. Differential diagnosis includes splenic laceration/hematoma, infarct, phlegmon/developing abscess, or less likely neoplasm. Mild diffuse dilatation of small bowel and nondependent colon, most likely due to ileus. Small left pleural effusion and free pelvic fluid. Critical Value/emergent results were called by telephone at the time of interpretation on 07/18/2023 at 4:46 pm to provider DAVID TAT , who verbally acknowledged these results. Electronically Signed   By: Danae Orleans M.D.   On: 07/18/2023 17:04   CT CHEST W CONTRAST  Result Date: 07/18/2023 CLINICAL DATA:  Fever. Respiratory illness. Abdominal pain. Ileus. Colitis. EXAM: CT CHEST, ABDOMEN, AND PELVIS WITH CONTRAST TECHNIQUE: Multidetector CT imaging of the chest, abdomen and pelvis was performed following the standard protocol during bolus administration of intravenous contrast. RADIATION DOSE REDUCTION: This exam was performed according to the departmental dose-optimization program which includes automated exposure control, adjustment of the mA and/or kV according to patient size and/or use of iterative reconstruction technique. CONTRAST:  OMNIPAQUE IOHEXOL 300 MG/ML  SOLN COMPARISON:  Noncontrast AP CT on 07/12/2023 FINDINGS: CT CHEST FINDINGS Cardiovascular: No acute findings. Mediastinum/Lymph Nodes: No masses or pathologically enlarged lymph nodes identified. Lungs/Pleura: No suspicious pulmonary nodules or masses identified. No No evidence of pulmonary consolidation or central endobronchial obstruction. Small left pleural effusion noted as well as mild dependent atelectasis. Musculoskeletal:  No suspicious bone lesions identified. CT ABDOMEN AND PELVIS FINDINGS Hepatobiliary: No masses identified. Gallbladder is unremarkable. No evidence of biliary ductal dilatation. Pancreas:  No mass or inflammatory changes. Spleen:  Within normal limits in size. Heterogeneous and poorly defined low-attenuation lesion is seen involving the majority of the spleen, without perisplenic fluid. Differential diagnosis includes splenic laceration, infarct, abscess, or less likely neoplasm. Adrenals/Urinary tract: No suspicious masses or hydronephrosis. Stomach/Bowel: Nasogastric tube tip in body of stomach. Mild diffuse dilatation of small bowel and nondependent colon, most likely due to ileus. No evidence of obstruction, inflammatory process, or abnormal fluid collections. Normal appendix visualized. Vascular/Lymphatic: No pathologically enlarged lymph nodes identified. No acute vascular findings. Reproductive: No mass or inflammatory changes identified. Small amount of low-attenuation free fluid seen in pelvic cul-de-sac. Other:  None. Musculoskeletal:  No suspicious bone lesions identified. IMPRESSION: Heterogeneous and poorly defined low-attenuation lesion involving the majority of the spleen. Differential diagnosis includes splenic laceration/hematoma, infarct, phlegmon/developing abscess, or less likely neoplasm. Mild diffuse dilatation of small bowel and nondependent colon, most likely due to ileus. Small left pleural effusion and free pelvic fluid. Critical Value/emergent results were called by telephone at the time of interpretation on 07/18/2023 at 4:46 pm to provider DAVID TAT , who verbally acknowledged these results. Electronically Signed   By: Danae Orleans M.D.   On: 07/18/2023 17:04    Assessment: 52 y.o. female with a history of HTN and alcohol abuse who presenting to ED with N/V, body aches,  found to be mildly febrile at 100.2, also with hypokalemia, mildly elevated LFTs and bilirubin.  RUQ ultrasound with hepatic steatosis.  She was admitted on CIWA protocol.  Ultimately developed severe alcohol withdrawal, DTs, and started on Precedex on 7/18 (now discontinued). GI now following due to small bowel and colonic dilatation.   Flex  sig 7/23 for decompression noting erythema in transverse colon, dilated descending and transverse colon s/p fluid aspiration. Stool in rectum, sigmoid, descending, and transverse.  Surgery has been following.  She did have NG tube in place,  but this was discontinued yesterday per surgery and seems to be tolerating a clear liquid diet today.  She is still not had a bowel movement and remains on TPN for now. Surgery has added magnesium citrate today.  CT 7/28 with mild diffuse dilatation of small bowel and colon, most likely due to ileus. Noted also poorly defined low-attenuating lesion involving majority of the spleen concerning for possible splenic laceration versus abscess versus infarct.  She has had leukocytosis and fevers and has been on broad-spectrum antibiotics.  Leukocytosis is improving.  Surgery has ordered C. difficile and GI pathogen panel to evaluate for other causes of fever and leukocytosis though these have not been completed as she has not yet had a bowel movement.  They are also considering reimaging later this week pending clinical course.   Plan: Appreciate surgery following.  Continue current bowel regimen with bisacodyl suppository daily, MiraLAX 17 g daily, Senokot-S twice daily. Magnesium citrate added today per surgery. Will need outpatient colonoscopy and EGD (distal esophageal wall thickening also seen on CT on admission).   LOS: 15 days    07/20/2023, 11:42 AM   Ermalinda Memos, PA-C [facility]

## 2023-07-20 NOTE — Progress Notes (Addendum)
[facility] Progress Note  7 Days Post-Op  Subjective: Patient seen and examined. She is resting comfortably in bed. She endorses abdominal bloating. She says that last night she had a large episode of emesis (90ml documented), but denies any nausea or vomiting currently. She notes that her esophagus was burning her prior to this happening. She denies chest pain. She does not endorse any N/V with liquids. She has not had a BM for the past two days. Denies passing flatus.  Objective: Vital signs in last 24 hours: Temp:  [98.4 F (36.9 C)-100.4 F (38 C)] 99.1 F (37.3 C) (07/30 0459) Pulse Rate:  [143] 143 (07/29 0921) Resp:  [18-34] 29 (07/29 2323) BP: (111-154)/(66-117) 128/73 (07/30 0331) SpO2:  [99 %-100 %] 100 % (07/29 1800) Weight:  [63.2 kg] 63.2 kg (07/30 0459) Last BM Date : 07/17/23  Intake/Output from previous day: 07/29 0701 - 07/30 0700 In: 1494.9 [I.V.:1104.2; IV Piggyback:390.7] Out: 1440 [Urine:1350; Emesis/NG output:90] Intake/Output this shift: Total I/O In: -  Out: 90 [Emesis/NG output:90]  General appearance: alert, cooperative, and no distress GI: Abdomen soft, mild distention, no percussion tenderness, nontender to palpation; no rigidity, guarding, rebound tenderness; no tenderness in left upper quadrant  Lab Results:  Recent Labs    07/19/23 0402 07/20/23 0505  WBC 19.3* 11.6*  HGB 9.5* 8.8*  HCT 28.6* 26.1*  PLT 218 218   BMET Recent Labs    07/19/23 0402 07/20/23 0505  NA 130* 132*  K 4.1 4.5  CL 103 103  CO2 21* 25  GLUCOSE 174* 180*  BUN 8 13  CREATININE 0.45 0.57  CALCIUM 9.7 9.3   PT/INR No results for input(s): "LABPROT", "INR" in the last 72 hours.  Studies/Results: CT ABDOMEN PELVIS W CONTRAST  Result Date: 07/18/2023 CLINICAL DATA:  Fever. Respiratory illness. Abdominal pain. Ileus. Colitis. EXAM: CT CHEST, ABDOMEN, AND PELVIS WITH CONTRAST TECHNIQUE: Multidetector CT imaging of the chest, abdomen and  pelvis was performed following the standard protocol during bolus administration of intravenous contrast. RADIATION DOSE REDUCTION: This exam was performed according to the departmental dose-optimization program which includes automated exposure control, adjustment of the mA and/or kV according to patient size and/or use of iterative reconstruction technique. CONTRAST:  OMNIPAQUE IOHEXOL 300 MG/ML  SOLN COMPARISON:  Noncontrast AP CT on 07/12/2023 FINDINGS: CT CHEST FINDINGS Cardiovascular: No acute findings. Mediastinum/Lymph Nodes: No masses or pathologically enlarged lymph nodes identified. Lungs/Pleura: No suspicious pulmonary nodules or masses identified. No No evidence of pulmonary consolidation or central endobronchial obstruction. Small left pleural effusion noted as well as mild dependent atelectasis. Musculoskeletal:  No suspicious bone lesions identified. CT ABDOMEN AND PELVIS FINDINGS Hepatobiliary: No masses identified. Gallbladder is unremarkable. No evidence of biliary ductal dilatation. Pancreas:  No mass or inflammatory changes. Spleen: Within normal limits in size. Heterogeneous and poorly defined low-attenuation lesion is seen involving the majority of the spleen, without perisplenic fluid. Differential diagnosis includes splenic laceration, infarct, abscess, or less likely neoplasm. Adrenals/Urinary tract: No suspicious masses or hydronephrosis. Stomach/Bowel: Nasogastric tube tip in body of stomach. Mild diffuse dilatation of small bowel and nondependent colon, most likely due to ileus. No evidence of obstruction, inflammatory process, or abnormal fluid collections. Normal appendix visualized. Vascular/Lymphatic: No pathologically enlarged lymph nodes identified. No acute vascular findings. Reproductive: No mass or inflammatory changes identified. Small amount of low-attenuation free fluid seen in pelvic cul-de-sac. Other:  None. Musculoskeletal:  No suspicious bone lesions identified.  IMPRESSION: Heterogeneous and poorly defined low-attenuation lesion involving  the majority of the spleen. Differential diagnosis includes splenic laceration/hematoma, infarct, phlegmon/developing abscess, or less likely neoplasm. Mild diffuse dilatation of small bowel and nondependent colon, most likely due to ileus. Small left pleural effusion and free pelvic fluid. Critical Value/emergent results were called by telephone at the time of interpretation on 07/18/2023 at 4:46 pm to provider DAVID TAT , who verbally acknowledged these results. Electronically Signed   By: Danae Orleans M.D.   On: 07/18/2023 17:04   CT CHEST W CONTRAST  Result Date: 07/18/2023 CLINICAL DATA:  Fever. Respiratory illness. Abdominal pain. Ileus. Colitis. EXAM: CT CHEST, ABDOMEN, AND PELVIS WITH CONTRAST TECHNIQUE: Multidetector CT imaging of the chest, abdomen and pelvis was performed following the standard protocol during bolus administration of intravenous contrast. RADIATION DOSE REDUCTION: This exam was performed according to the departmental dose-optimization program which includes automated exposure control, adjustment of the mA and/or kV according to patient size and/or use of iterative reconstruction technique. CONTRAST:  OMNIPAQUE IOHEXOL 300 MG/ML  SOLN COMPARISON:  Noncontrast AP CT on 07/12/2023 FINDINGS: CT CHEST FINDINGS Cardiovascular: No acute findings. Mediastinum/Lymph Nodes: No masses or pathologically enlarged lymph nodes identified. Lungs/Pleura: No suspicious pulmonary nodules or masses identified. No No evidence of pulmonary consolidation or central endobronchial obstruction. Small left pleural effusion noted as well as mild dependent atelectasis. Musculoskeletal:  No suspicious bone lesions identified. CT ABDOMEN AND PELVIS FINDINGS Hepatobiliary: No masses identified. Gallbladder is unremarkable. No evidence of biliary ductal dilatation. Pancreas:  No mass or inflammatory changes. Spleen: Within normal  limits in size. Heterogeneous and poorly defined low-attenuation lesion is seen involving the majority of the spleen, without perisplenic fluid. Differential diagnosis includes splenic laceration, infarct, abscess, or less likely neoplasm. Adrenals/Urinary tract: No suspicious masses or hydronephrosis. Stomach/Bowel: Nasogastric tube tip in body of stomach. Mild diffuse dilatation of small bowel and nondependent colon, most likely due to ileus. No evidence of obstruction, inflammatory process, or abnormal fluid collections. Normal appendix visualized. Vascular/Lymphatic: No pathologically enlarged lymph nodes identified. No acute vascular findings. Reproductive: No mass or inflammatory changes identified. Small amount of low-attenuation free fluid seen in pelvic cul-de-sac. Other:  None. Musculoskeletal:  No suspicious bone lesions identified. IMPRESSION: Heterogeneous and poorly defined low-attenuation lesion involving the majority of the spleen. Differential diagnosis includes splenic laceration/hematoma, infarct, phlegmon/developing abscess, or less likely neoplasm. Mild diffuse dilatation of small bowel and nondependent colon, most likely due to ileus. Small left pleural effusion and free pelvic fluid. Critical Value/emergent results were called by telephone at the time of interpretation on 07/18/2023 at 4:46 pm to provider DAVID TAT , who verbally acknowledged these results. Electronically Signed   By: Danae Orleans M.D.   On: 07/18/2023 17:04    Anti-infectives: Anti-infectives (From admission, onward)    Start     Dose/Rate Route Frequency Ordered Stop   07/19/23 1800  vancomycin (VANCOREADY) IVPB 1500 mg/300 mL        1,500 mg 150 mL/hr over 120 Minutes Intravenous Every 24 hours 07/18/23 1840     07/19/23 1500  meropenem (MERREM) 1 g in sodium chloride 0.9 % 100 mL IVPB        1 g 200 mL/hr over 30 Minutes Intravenous Every 8 hours 07/19/23 1352     07/18/23 2200  piperacillin-tazobactam (ZOSYN)  IVPB 3.375 g  Status:  Discontinued        3.375 g 12.5 mL/hr over 240 Minutes Intravenous Every 8 hours 07/18/23 1809 07/19/23 1352   07/18/23 1845  vancomycin (VANCOREADY) IVPB  1500 mg/300 mL        1,500 mg 150 mL/hr over 120 Minutes Intravenous STAT 07/18/23 1840 07/19/23 1427   07/16/23 1100  Ampicillin-Sulbactam (UNASYN) 3 g in sodium chloride 0.9 % 100 mL IVPB  Status:  Discontinued        3 g 200 mL/hr over 30 Minutes Intravenous Every 6 hours 07/16/23 0649 07/18/23 1800       Assessment/Plan:  Patient is a 52 year old female who was admitted with alcohol withdrawal.  She subsequently developed an adynamic ileus with small and large bowel dilation. She underwent CT of the chest, abdomen, and pelvis 7/28 which demonstrated concern for possible splenic laceration versus abscess versus infarct. Her improving fevers, tachycardia, and leukocytosis (19.3 -> 11.6, predominantly neutrophils), suggest an infectious source such as a splenic abscess. It is also possible that she has fevers and leukocytosis secondary to splenic laceration secondary to flexible sigmoidoscopy. Continue broad-spectrum antibiotics pending C-diff and GI panel and repeat imaging. Ileus continuing to improve on clinical exam but low threshold to back down diet given emesis.  -Start simethicone for bloating -Low threshold for EKG/echo if burning is thought to be chest pain -Will plan to reimage spleen 8/1 to evaluate for any change -Continue clears with low threshold to discontinue if emesis persists -Continue TPN until adequate oral intake -If patient's condition changes to the point of needing surgical intervention, she will be unable to undergo surgery at this facility and will require transfer 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 This was discussed with anesthesiology.  -Continue bowel regimen -Electrolyte replacement -Remainder of care per primary team   LOS: 15 days    Dominica Severin 07/20/2023

## 2023-07-20 NOTE — Progress Notes (Signed)
Physical Therapy Treatment Patient Details Name: AARIONNA AZIZI MRN: [id number] DOB: September 23, 1971 Today's Date: 07/20/2023   History of Present Illness Heidi [name] is a 52 y.o. female with medical history significant of hypertension, alcohol abuse, tobacco use disorder who presents to the emergency department from home via EMS due to complaints of generalized bodyaches, nausea, vomiting which she thinks was related to alcohol withdrawal.  Patient usually drinks 4 40 ounce beers daily, but she only had 2 12 ounce beers today.  Patient complained of pain in chest, legs, arms and endorsed having had some falls without any injury.  She endorsed a left forearm rash which occurred after being bitten by a yellowjacket on Saturday (2 days ago).    PT Comments  Pt was pleasant and tolerated treatment well today.  Required min assist to transfer and ambulate using a RW. Ambulation limited due to fatigue of LE; pt walked with nursing staff previously that day. Patient will benefit from continued skilled physical therapy in hospital and recommended venue below to increase strength, balance, endurance for safe ADLs and gait.    If plan is discharge home, recommend the following: A lot of help with bathing/dressing/bathroom;A lot of help with walking and/or transfers;Help with stairs or ramp for entrance;Assistance with cooking/housework   Can travel by private vehicle     No  Equipment Recommendations  Rolling walker (2 wheels)    Recommendations for Other Services       Precautions / Restrictions Precautions Precautions: Fall Restrictions Weight Bearing Restrictions: No     Mobility  Bed Mobility Overal bed mobility: Needs Assistance Bed Mobility: Supine to Sit     Supine to sit: Modified independent (Device/Increase time)     General bed mobility comments: increased time    Transfers Overall transfer level: Needs assistance Equipment used: Rolling walker (2 wheels) Transfers: Sit  to/from Stand, Bed to chair/wheelchair/BSC Sit to Stand: Min assist   Step pivot transfers: Min assist       General transfer comment: using RW    Ambulation/Gait Ambulation/Gait assistance: Min assist Gait Distance (Feet): 75 Feet Assistive device: Rolling walker (2 wheels) Gait Pattern/deviations: Decreased step length - right, Decreased step length - left, Decreased stride length, Knees buckling Gait velocity: slow     General Gait Details: movement slightlyunsteady, min assist from therapist   Stairs             Wheelchair Mobility     Tilt Bed    Modified Rankin (Stroke Patients Only)       Balance Overall balance assessment: Modified Independent Sitting-balance support: No upper extremity supported, Feet supported Sitting balance-Leahy Scale: Good Sitting balance - Comments: seated at EOB   Standing balance support: Reliant on assistive device for balance, During functional activity, Bilateral upper extremity supported Standing balance-Leahy Scale: Fair Standing balance comment: using RW                            Cognition Arousal/Alertness: Awake/alert Behavior During Therapy: WFL for tasks assessed/performed Overall Cognitive Status: Within Functional Limits for tasks assessed                                          Exercises General Exercises - Lower Extremity Ankle Circles/Pumps: Strengthening, Seated, Right, Left, 10 reps, AROM Long Arc Quad: AROM, Seated, Strengthening, Right, Left, 10  reps Hip Flexion/Marching: AROM, Strengthening, Seated, Right, Left, 10 reps Toe Raises: AROM, Seated, Strengthening, Right, Left, 10 reps Heel Raises: AROM, Seated, Strengthening, Right, Left, 10 reps    General Comments        Pertinent Vitals/Pain Pain Assessment Pain Assessment: No/denies pain    Home Living                          Prior Function            PT Goals (current goals can now be found  in the care plan section) Acute Rehab PT Goals Patient Stated Goal: return home with family to assist PT Goal Formulation: With patient Time For Goal Achievement: 08/02/23 Potential to Achieve Goals: Good Progress towards PT goals: Progressing toward goals    Frequency    Min 3X/week      PT Plan      Co-evaluation              AM-PAC PT "6 Clicks" Mobility   Outcome Measure  Help needed turning from your back to your side while in a flat bed without using bedrails?: A Little Help needed moving from lying on your back to sitting on the side of a flat bed without using bedrails?: A Little Help needed moving to and from a bed to a chair (including a wheelchair)?: A Little Help needed standing up from a chair using your arms (e.g., wheelchair or bedside chair)?: A Little Help needed to walk in hospital room?: A Lot Help needed climbing 3-5 steps with a railing? : A Lot 6 Click Score: 16    End of Session   Activity Tolerance: Patient tolerated treatment well;Patient limited by fatigue Patient left: with call bell/phone within reach;in bed Nurse Communication: Mobility status PT Visit Diagnosis: Unsteadiness on feet (R26.81);Other abnormalities of gait and mobility (R26.89);Muscle weakness (generalized) (M62.81)     Time: 1334-1400 PT Time Calculation (min) (ACUTE ONLY): 26 min  Charges:    $Gait Training: 8-22 mins $Therapeutic Exercise: 8-22 mins PT General Charges $$ ACUTE PT VISIT: 1 Visit                     Martel Galvan SPT [place], DPT Program

## 2023-07-20 NOTE — Plan of Care (Signed)

## 2023-07-20 NOTE — Plan of Care (Signed)
  Problem: Education: Goal: Knowledge of General Education information will improve Description: Including pain rating scale, medication(s)/side effects and non-pharmacologic comfort measures 07/20/2023 0543 by Kern Alberta, RN Outcome: Not Progressing 07/20/2023 0542 by Kern Alberta, RN Outcome: Not Progressing   Problem: Health Behavior/Discharge Planning: Goal: Ability to manage health-related needs will improve 07/20/2023 0543 by Kern Alberta, RN Outcome: Not Progressing 07/20/2023 0542 by Kern Alberta, RN Outcome: Not Progressing   Problem: Clinical Measurements: Goal: Ability to maintain clinical measurements within normal limits will improve 07/20/2023 0543 by Kern Alberta, RN Outcome: Progressing 07/20/2023 0542 by Kern Alberta, RN Outcome: Progressing Goal: Will remain free from infection 07/20/2023 0543 by Kern Alberta, RN Outcome: Progressing 07/20/2023 0542 by Kern Alberta, RN Outcome: Progressing Goal: Diagnostic test results will improve 07/20/2023 0543 by Kern Alberta, RN Outcome: Progressing 07/20/2023 0542 by Kern Alberta, RN Outcome: Progressing Goal: Respiratory complications will improve 07/20/2023 0543 by Kern Alberta, RN Outcome: Progressing 07/20/2023 0542 by Kern Alberta, RN Outcome: Progressing Goal: Cardiovascular complication will be avoided 07/20/2023 0543 by Kern Alberta, RN Outcome: Progressing 07/20/2023 0542 by Kern Alberta, RN Outcome: Progressing   Problem: Activity: Goal: Risk for activity intolerance will decrease 07/20/2023 0543 by Kern Alberta, RN Outcome: Progressing 07/20/2023 0542 by Kern Alberta, RN Outcome: Progressing   Problem: Nutrition: Goal: Adequate nutrition will be maintained 07/20/2023 0543 by Kern Alberta, RN Outcome: Progressing 07/20/2023 0542 by Kern Alberta, RN Outcome: Progressing   Problem: Coping: Goal: Level of anxiety will decrease 07/20/2023 0543 by Kern Alberta, RN Outcome: Progressing 07/20/2023 0542 by Kern Alberta, RN Outcome: Progressing   Problem: Elimination: Goal: Will not experience complications related to bowel motility 07/20/2023 0543 by Kern Alberta, RN Outcome: Progressing 07/20/2023 0542 by Kern Alberta, RN Outcome: Progressing Goal: Will not experience complications related to urinary retention 07/20/2023 0543 by Kern Alberta, RN Outcome: Progressing 07/20/2023 0542 by Kern Alberta, RN Outcome: Progressing   Problem: Pain Managment: Goal: General experience of comfort will improve 07/20/2023 0543 by Kern Alberta, RN Outcome: Not Progressing 07/20/2023 0542 by Kern Alberta, RN Outcome: Not Progressing   Problem: Safety: Goal: Ability to remain free from injury will improve 07/20/2023 0543 by Kern Alberta, RN Outcome: Progressing 07/20/2023 0542 by Kern Alberta, RN Outcome: Not Progressing   Problem: Skin Integrity: Goal: Risk for impaired skin integrity will decrease 07/20/2023 0543 by Kern Alberta, RN Outcome: Not Progressing 07/20/2023 0542 by Kern Alberta, RN Outcome: Not Progressing   Problem: Safety: Goal: Non-violent Restraint(s) 07/20/2023 0543 by Kern Alberta, RN Outcome: Progressing 07/20/2023 0542 by Kern Alberta, RN Outcome: Not Progressing

## 2023-07-20 NOTE — Progress Notes (Signed)
PROGRESS NOTE  Heidi [name] AVW:[id number] DOB: 05-06-1971 DOA: 07/05/2023 PCP: Rica Records, FNP  Brief History:   52 y.o. female with medical history significant of hypertension, alcohol abuse, tobacco use disorder who presents to the emergency department from home via EMS due to complaints of generalized bodyaches, nausea, vomiting which she thinks was related to alcohol withdrawal. Patient usually drinks 4 40 ounce beers daily, but she only had 2 12 ounce beers today.  Patient complained of pain in chest, legs, arms and endorsed having had some falls without any injury.  Her hospitalization has been prolonged secondary to delirium and agitation presumably from alcohol withdrawal.  She has been on Precedex since 07/07/2023.  Attempts at weaning had resulted in increased agitation.  Unfortunately, her hospitalization has been prolonged secondary to development of ileus.  NG was inserted to low intermittent suction on 07/14/2023.  PICC line placed on 07/15/23 and TPN was initiated due to prolonged period of no oral intake and prolonged ileus.   On 07/16/23, pt began showing signs of return of bowel function with 3 BMs in last 24 hours.  She developed a fever up to 102.2.  UA was negative.  Blood cultures were drawn and pt started on empiric unasyn  On 07/17/2023 in the late morning, the patient's Precedex was weaned off.  The patient remained fairly lucid and redirectable. She had one BM in last 24 hours.  TPN continues  On 07/10/2023, the patient continued to have fever up to 101.3 F.  She has had 2 bowel movements in last 24 hours.  She had NG output of 1150, but abdomen was less distended.   Assessment/Plan: Acute metabolic encephalopathy/delirium tremens -Patient has been on Precedex and 07/07/2023 -Attempts at weaning has resulted in increased agitation -Unfortunately, the patient's ileus has limited oral medications to help mitigate her agitation -Continue IV Precedex for  now>>weaned off 7/27 AM -Librium was discontinued -received high-dose thiamine  -07/17/23 Repeat UA 0-5WBC -W29--5621 -Ammonia--25 -Folic acid--8.7 -TSH--2.866 -UDS positive for benzodiazepine -07/14/23--add clonidine TTS-1>>TTS-2 -07/17/23--weaned off precedex around noon -07/20/23 mental status much improved, back to baseline   Abdominal ileus -07/12/2023 CT AP--multiple dilated fluid-filled loops of SB and colon -Appreciate GI>>possible neostigmine -07/13/2023 flex sig--no obstruction noted--erythematous mucosa in the transverse colon with dilated in the descending, transverse: -07/14/2023--NG inserted to low intermittent suction -Appreciate general surgery and GI assistance -optimize electrolytes -Overall appears to be improving>> remove NG 07/19/2023 -07/18/2023 CT abdomen shows improving ileus -Check C. Difficile--no BM to collect -7/30--abd mildly distended>>continue clear liquids;  continue TPN due to limited po intake   Splenic phlegmon/laceration? -Case discussed with general surgery, Dr. Robyne Peers -Case discussed with IR, Dr. Deanne Coffer -Primary considerations at this time--hemorrhage versus infection -Broaden antibiotic coverage for the next few days -Plan to repeat CT abdomen pelvis after 2-3 more days of antibiotics if no improvement clinically to help clarify etiology -fever and WBC improving now with vanc/zosyn   Fever -developed fever 102.2 in am 7/26 -fever 101.2 am 7/28 -blood cultures--neg to date -UA neg for pyuria -7/25 CXR personally reviewed--increased interstitial markings -KUB--personally reviewed--persistent bowel dilatation, unchanged -PCT 0.11 -lactic acid 0.8 -Broaden antibiotics to vancomycin/zosyn 7/29 -CT chest/ AP--Heterogeneous and poorly defined low-attenuation lesion involving the majority of the spleen. Differential diagnosis includes splenic laceration/hematoma, infarct, phlegmon/developing abscess, or less likely neoplasm.   Atrial  tachycardia -7/27 personally reviewed EKG--atrial tach -increased metoprolol to 7.5 mg q 6 hours -continue diltiazem drip  FEN -Replace potassium>>give additional IV KCl -7/25 PICC line for TPN initiation   Essential hypertension -Irbesartan and diltiazem were initially discontinued secondary to low blood pressure -Patient is now hypertensive again -clonidine TTS-1 started 7/24>>increased to TTS-2 on 7/27 PM -continue IV lopressor>>increased to 7.5 mg q 6   Thrombocytopenia -Secondary to myelosuppression from alcohol use -improved and stable   Transaminasemia -due to Etoh -improved   Tobacco cessation -NicoDerm patch -Cessation discussed   Hypokalemia -Replete -Check magnesium 1.9                 Family Communication:   Family at bedside updated 7/28   Consultants:  PCCM   Code Status:  FULL    DVT Prophylaxis:  SCDs     Procedures: As Listed in Progress Note Above   Antibiotics: Unasyn 7/26>>7/28 Vanc 7/28>> Zosyn 7/28>>         Subjective: Patient denies fevers, chills, headache, chest pain, dyspnea, nausea, vomiting, diarrhea, abdominal pain, dysuria, hematuria, hematochezia, and melena.   Objective: Vitals:   07/20/23 1155 07/20/23 1200 07/20/23 1300 07/20/23 1400  BP:  (!) 139/101 127/78 (!) 141/100  Pulse:  (!) 117 (!) 103 (!) 120  Resp:  (!) 23 (!) 21 18  Temp: 99.1 F (37.3 C)     TempSrc: Oral     SpO2:  100% 98% 100%  Weight:      Height:        Intake/Output Summary (Last 24 hours) at 07/20/2023 1513 Last data filed at 07/20/2023 1440 Gross per 24 hour  Intake 3310.54 ml  Output 1390 ml  Net 1920.54 ml   Weight change: -7.5 kg Exam:  General:  Pt is alert, follows commands appropriately, not in acute distress HEENT: No icterus, No thrush, No neck mass, [place]/AT Cardiovascular: RRR, S1/S2, no rubs, no gallops Respiratory: bibasilar crackles.  No wheeze Abdomen: Soft/+BS, non tender, mildly distended, no  guarding Extremities: No edema, No lymphangitis, No petechiae, No rashes, no synovitis   Data Reviewed: I have personally reviewed following labs and imaging studies Basic Metabolic Panel: Recent Labs  Lab 07/15/23 0600 07/16/23 0604 07/17/23 0325 07/18/23 0430 07/19/23 0402 07/20/23 0505  NA 145 141 136 136 130* 132*  K 3.4* 3.5 3.0* 3.6 4.1 4.5  CL 115* 114* 108 108 103 103  CO2 20* 19* 22 25 21* 25  GLUCOSE 85 136* 134* 155* 174* 180*  BUN 14 10 7 7 8 13   CREATININE 0.74 0.64 0.45 0.49 0.45 0.57  CALCIUM 9.1 9.0 9.0 9.0 9.7 9.3  MG 2.0 2.1 1.9 1.9 1.9  --   PHOS 2.9 3.0 3.6 4.5  --   --    Liver Function Tests: Recent Labs  Lab 07/15/23 0600 07/16/23 0604 07/18/23 0430 07/19/23 0402 07/20/23 0505  AST 29 28 27 30  40  ALT 19 16 17 25  37  ALKPHOS 75 80 76 88 78  BILITOT 1.0 0.7 0.6 0.7 0.2*  PROT 6.4* 6.3* 6.2* 7.0 6.4*  ALBUMIN 2.9* 2.8* 2.6* 3.0* 2.7*   No results for input(s): "LIPASE", "AMYLASE" in the last 168 hours. Recent Labs  Lab 07/15/23 0600  AMMONIA 25   Coagulation Profile: No results for input(s): "INR", "PROTIME" in the last 168 hours. CBC: Recent Labs  Lab 07/16/23 0604 07/17/23 1135 07/18/23 0430 07/19/23 0402 07/20/23 0505  WBC 15.6* 17.4* 17.2* 19.3* 11.6*  NEUTROABS  --   --   --   --  8.2*  HGB 10.6* 9.1* 8.5* 9.5* 8.8*  HCT 32.7* 26.2* 25.4* 28.6* 26.1*  MCV 92.9 88.5 89.8 90.2 90.9  PLT 174 157 173 218 218   Cardiac Enzymes: No results for input(s): "CKTOTAL", "CKMB", "CKMBINDEX", "TROPONINI" in the last 168 hours. BNP: Invalid input(s): "POCBNP" CBG: Recent Labs  Lab 07/19/23 1451 07/19/23 1614 07/20/23 0004 07/20/23 0542 07/20/23 1140  GLUCAP 158* 167* 204* 176* 203*   HbA1C: No results for input(s): "HGBA1C" in the last 72 hours. Urine analysis:    Component Value Date/Time   COLORURINE YELLOW 07/17/2023 1745   APPEARANCEUR CLEAR 07/17/2023 1745   LABSPEC 1.010 07/17/2023 1745   PHURINE 7.0 07/17/2023  1745   GLUCOSEU NEGATIVE 07/17/2023 1745   HGBUR NEGATIVE 07/17/2023 1745   BILIRUBINUR NEGATIVE 07/17/2023 1745   KETONESUR NEGATIVE 07/17/2023 1745   PROTEINUR NEGATIVE 07/17/2023 1745   UROBILINOGEN 0.2 01/04/2012 1330   NITRITE NEGATIVE 07/17/2023 1745   LEUKOCYTESUR NEGATIVE 07/17/2023 1745   Sepsis Labs: @LABRCNTIP (procalcitonin:4,lacticidven:4) ) Recent Results (from the past 240 hour(s))  Culture, blood (Routine X 2) w Reflex to ID Panel     Status: None   Collection Time: 07/15/23 10:08 AM   Specimen: BLOOD RIGHT HAND  Result Value Ref Range Status   Specimen Description BLOOD RIGHT HAND  Final   Special Requests   Final    BOTTLES DRAWN AEROBIC AND ANAEROBIC Blood Culture adequate volume   Culture   Final    NO GROWTH 5 DAYS Performed at [facility], [address], [place], [place] [address]    Report Status 07/20/2023 FINAL  Final  Culture, blood (Routine X 2) w Reflex to ID Panel     Status: None   Collection Time: 07/15/23 10:08 AM   Specimen: BLOOD  Result Value Ref Range Status   Specimen Description BLOOD BLOOD LEFT HAND  Final   Special Requests   Final    BOTTLES DRAWN AEROBIC AND ANAEROBIC Blood Culture adequate volume   Culture   Final    NO GROWTH 5 DAYS Performed at [facility], [address], [place], [place] [address]    Report Status 07/20/2023 FINAL  Final  Culture, blood (Routine X 2) w Reflex to ID Panel     Status: None (Preliminary result)   Collection Time: 07/18/23  6:38 PM   Specimen: BLOOD LEFT FOREARM  Result Value Ref Range Status   Specimen Description BLOOD LEFT FOREARM  Final   Special Requests   Final    BOTTLES DRAWN AEROBIC AND ANAEROBIC Blood Culture results may not be optimal due to an excessive volume of blood received in culture bottles   Culture   Final    NO GROWTH 2 DAYS Performed at [facility], [address], [place], [place] [address]    Report Status PENDING  Incomplete  Culture, blood (Routine X 2) w Reflex to  ID Panel     Status: None (Preliminary result)   Collection Time: 07/18/23  6:47 PM   Specimen: BLOOD LEFT HAND  Result Value Ref Range Status   Specimen Description BLOOD LEFT HAND  Final   Special Requests   Final    BOTTLES DRAWN AEROBIC AND ANAEROBIC BLOOD LEFT HAND   Culture   Final    NO GROWTH 2 DAYS Performed at [facility], [address] [place], [place] [address]    Report Status PENDING  Incomplete     Scheduled Meds:  bisacodyl  10 mg Rectal Daily   Chlorhexidine Gluconate Cloth  6 each Topical Daily  cloNIDine  0.2 mg Transdermal Weekly   [START ON 07/21/2023] folic acid  1 mg Oral Daily   insulin aspart  0-15 Units Subcutaneous Q6H   metoprolol tartrate  7.5 mg Intravenous Q6H   nicotine  14 mg Transdermal Daily   polyethylene glycol  17 g Oral Daily   senna-docusate  1 tablet Oral BID   sodium chloride flush  10-40 mL Intracatheter Q12H   thiamine (VITAMIN B1) injection  100 mg Intravenous Q24H   Continuous Infusions:  dexmedetomidine (PRECEDEX) IV infusion Stopped (07/17/23 1136)   diltiazem (CARDIZEM) infusion 15 mg/hr (07/20/23 1440)   meropenem (MERREM) IV Stopped (07/20/23 1314)   TPN ADULT (ION) 80 mL/hr at 07/20/23 1440   TPN ADULT (ION)     vancomycin Stopped (07/19/23 1807)    Procedures/Studies: CT ABDOMEN PELVIS W CONTRAST  Result Date: 07/18/2023 CLINICAL DATA:  Fever. Respiratory illness. Abdominal pain. Ileus. Colitis. EXAM: CT CHEST, ABDOMEN, AND PELVIS WITH CONTRAST TECHNIQUE: Multidetector CT imaging of the chest, abdomen and pelvis was performed following the standard protocol during bolus administration of intravenous contrast. RADIATION DOSE REDUCTION: This exam was performed according to the departmental dose-optimization program which includes automated exposure control, adjustment of the mA and/or kV according to patient size and/or use of iterative reconstruction technique. CONTRAST:  OMNIPAQUE IOHEXOL 300 MG/ML  SOLN COMPARISON:   Noncontrast AP CT on 07/12/2023 FINDINGS: CT CHEST FINDINGS Cardiovascular: No acute findings. Mediastinum/Lymph Nodes: No masses or pathologically enlarged lymph nodes identified. Lungs/Pleura: No suspicious pulmonary nodules or masses identified. No No evidence of pulmonary consolidation or central endobronchial obstruction. Small left pleural effusion noted as well as mild dependent atelectasis. Musculoskeletal:  No suspicious bone lesions identified. CT ABDOMEN AND PELVIS FINDINGS Hepatobiliary: No masses identified. Gallbladder is unremarkable. No evidence of biliary ductal dilatation. Pancreas:  No mass or inflammatory changes. Spleen: Within normal limits in size. Heterogeneous and poorly defined low-attenuation lesion is seen involving the majority of the spleen, without perisplenic fluid. Differential diagnosis includes splenic laceration, infarct, abscess, or less likely neoplasm. Adrenals/Urinary tract: No suspicious masses or hydronephrosis. Stomach/Bowel: Nasogastric tube tip in body of stomach. Mild diffuse dilatation of small bowel and nondependent colon, most likely due to ileus. No evidence of obstruction, inflammatory process, or abnormal fluid collections. Normal appendix visualized. Vascular/Lymphatic: No pathologically enlarged lymph nodes identified. No acute vascular findings. Reproductive: No mass or inflammatory changes identified. Small amount of low-attenuation free fluid seen in pelvic cul-de-sac. Other:  None. Musculoskeletal:  No suspicious bone lesions identified. IMPRESSION: Heterogeneous and poorly defined low-attenuation lesion involving the majority of the spleen. Differential diagnosis includes splenic laceration/hematoma, infarct, phlegmon/developing abscess, or less likely neoplasm. Mild diffuse dilatation of small bowel and nondependent colon, most likely due to ileus. Small left pleural effusion and free pelvic fluid. Critical Value/emergent results were called by telephone at  the time of interpretation on 07/18/2023 at 4:46 pm to provider Dalila Arca , who verbally acknowledged these results. Electronically Signed   By: Danae Orleans M.D.   On: 07/18/2023 17:04   CT CHEST W CONTRAST  Result Date: 07/18/2023 CLINICAL DATA:  Fever. Respiratory illness. Abdominal pain. Ileus. Colitis. EXAM: CT CHEST, ABDOMEN, AND PELVIS WITH CONTRAST TECHNIQUE: Multidetector CT imaging of the chest, abdomen and pelvis was performed following the standard protocol during bolus administration of intravenous contrast. RADIATION DOSE REDUCTION: This exam was performed according to the departmental dose-optimization program which includes automated exposure control, adjustment of the mA and/or kV according to patient size and/or use  of iterative reconstruction technique. CONTRAST:  OMNIPAQUE IOHEXOL 300 MG/ML  SOLN COMPARISON:  Noncontrast AP CT on 07/12/2023 FINDINGS: CT CHEST FINDINGS Cardiovascular: No acute findings. Mediastinum/Lymph Nodes: No masses or pathologically enlarged lymph nodes identified. Lungs/Pleura: No suspicious pulmonary nodules or masses identified. No No evidence of pulmonary consolidation or central endobronchial obstruction. Small left pleural effusion noted as well as mild dependent atelectasis. Musculoskeletal:  No suspicious bone lesions identified. CT ABDOMEN AND PELVIS FINDINGS Hepatobiliary: No masses identified. Gallbladder is unremarkable. No evidence of biliary ductal dilatation. Pancreas:  No mass or inflammatory changes. Spleen: Within normal limits in size. Heterogeneous and poorly defined low-attenuation lesion is seen involving the majority of the spleen, without perisplenic fluid. Differential diagnosis includes splenic laceration, infarct, abscess, or less likely neoplasm. Adrenals/Urinary tract: No suspicious masses or hydronephrosis. Stomach/Bowel: Nasogastric tube tip in body of stomach. Mild diffuse dilatation of small bowel and nondependent colon, most likely  due to ileus. No evidence of obstruction, inflammatory process, or abnormal fluid collections. Normal appendix visualized. Vascular/Lymphatic: No pathologically enlarged lymph nodes identified. No acute vascular findings. Reproductive: No mass or inflammatory changes identified. Small amount of low-attenuation free fluid seen in pelvic cul-de-sac. Other:  None. Musculoskeletal:  No suspicious bone lesions identified. IMPRESSION: Heterogeneous and poorly defined low-attenuation lesion involving the majority of the spleen. Differential diagnosis includes splenic laceration/hematoma, infarct, phlegmon/developing abscess, or less likely neoplasm. Mild diffuse dilatation of small bowel and nondependent colon, most likely due to ileus. Small left pleural effusion and free pelvic fluid. Critical Value/emergent results were called by telephone at the time of interpretation on 07/18/2023 at 4:46 pm to provider Jackee Glasner , who verbally acknowledged these results. Electronically Signed   By: Danae Orleans M.D.   On: 07/18/2023 17:04   DG Abd 1 View  Result Date: 07/18/2023 CLINICAL DATA:  Ileus EXAM: ABDOMEN - 1 VIEW COMPARISON:  07/16/2023 FINDINGS: NG tube remains positioned within the stomach. Decreasing size and number of dilated small bowel loops. Small bowel dilation now measures up to 4.4 cm (previously 5.4 cm). Persistent gaseous distension of the transverse colon. No gross free intraperitoneal air. IMPRESSION: Decreasing size and number of dilated small bowel loops. Appearance suggests resolving ileus or obstruction. Continued radiographic follow-up is recommended. Electronically Signed   By: Duanne Guess D.O.   On: 07/18/2023 09:42   DG Abd 1 View  Result Date: 07/16/2023 CLINICAL DATA:  Ileus, abdominal distention EXAM: ABDOMEN - 1 VIEW COMPARISON:  Previous studies including the examination of 07/15/2023 FINDINGS: There are dilated small bowel loops measuring up to 5.4 cm in diameter. Small-bowel dilation  appears slightly more prominent. Mucosal folds in the small bowel loops appear prominent. Gas is present in right colon. Tip of NG tube is seen in the stomach. IMPRESSION: There is dilation of small-bowel loops along with mucosal thickening. Findings may suggest ileus or enteritis or distal colonic obstruction. Electronically Signed   By: Ernie Avena M.D.   On: 07/16/2023 11:41   DG Abd 1 View  Result Date: 07/15/2023 CLINICAL DATA:  [id number] Ileus [facility]) [id number] EXAM: ABDOMEN - 1 VIEW COMPARISON:  07/14/2023 FINDINGS: Enteric tube remains positioned within the stomach. Persistently dilated loops of small bowel within the abdomen, similar to prior. No gross free intraperitoneal air on supine view. IMPRESSION: Persistently dilated loops of small bowel within the abdomen, ileus versus obstruction. Electronically Signed   By: Duanne Guess D.O.   On: 07/15/2023 13:17   DG CHEST PORT 1 VIEW  Result Date:  07/15/2023 CLINICAL DATA:  Fever EXAM: PORTABLE CHEST 1 VIEW COMPARISON:  07/09/2023 FINDINGS: New enteric tube which extends into the stomach. The heart size and mediastinal contours are within normal limits. Both lungs are clear. The visualized skeletal structures are unremarkable. Gaseous distension of bowel within the imaged upper abdomen. IMPRESSION: 1. No active cardiopulmonary disease. 2. New enteric tube which extends into the stomach. Electronically Signed   By: Duanne Guess D.O.   On: 07/15/2023 13:15   [place] EKG SITE RITE  Result Date: 07/14/2023 If Site Rite image not attached, placement could not be confirmed due to current cardiac rhythm.  DG Abd Portable 1V  Result Date: 07/14/2023 CLINICAL DATA:  NG tube placement EXAM: PORTABLE ABDOMEN - 1 VIEW COMPARISON:  Previous studies including the examination none earlier today FINDINGS: There is interval placement of NG tube with its tip and side port within the stomach. Dilation of small-bowel loops in central abdomen has not changed. Gas  is present in ascending and transverse colon. IMPRESSION: Tip of enteric tube is seen in the stomach. No change is seen in small bowel dilation. Electronically Signed   By: Ernie Avena M.D.   On: 07/14/2023 13:11   DG Abd 1 View  Result Date: 07/14/2023 CLINICAL DATA:  Abdominal distention EXAM: ABDOMEN - 1 VIEW COMPARISON:  Previous studies including the examination of 07/13/2023 FINDINGS: There is dilation of small-bowel loops in upper and mid abdomen. Gas is present in transverse colon. There is no demonstrable gas in rectosigmoid. No significant changes are noted in small bowel dilation. IMPRESSION: There is dilation of small bowel loops suggesting possible ileus or partial obstruction. No significant changes are noted. Electronically Signed   By: Ernie Avena M.D.   On: 07/14/2023 13:09   DG Abd 1 View  Result Date: 07/13/2023 CLINICAL DATA:  Follow-up ileus, initial encounter EXAM: ABDOMEN - 1 VIEW COMPARISON:  None Available. FINDINGS: Scattered large and small bowel gas is noted. Multiple dilated loops of small bowel are again noted similar to that seen on the prior exam. No free air is seen. No mass lesion is noted. No bony abnormality is seen. IMPRESSION: Stable small bowel dilatation centrally. Electronically Signed   By: Alcide Clever M.D.   On: 07/13/2023 20:09   DG Abd 1 View  Result Date: 07/13/2023 CLINICAL DATA:  Abdominal distension. EXAM: ABDOMEN - 1 VIEW COMPARISON:  07/12/2023 FINDINGS: Persistent gaseous distension of bowel loops throughout the abdomen and pelvis. The degree of bowel distension has minimally changed. Upper abdomen is incompletely imaged and not clear if nasogastric tube is still present. Cannot evaluate for free air on the supine image. IMPRESSION: Persistent gaseous distension of small and large bowel loops. Based on the previous CT findings, a distal colonic obstruction cannot be excluded. Bowel gas distension has minimally changed. Electronically  Signed   By: Richarda Overlie M.D.   On: 07/13/2023 16:33   DG Abd 1 View  Result Date: 07/12/2023 CLINICAL DATA:  Nasogastric tube placement. EXAM: ABDOMEN - 1 VIEW COMPARISON:  Radiograph and CT earlier today FINDINGS: Tip and side port of the enteric tube below the diaphragm in the stomach. Gaseous bowel distension persists throughout the abdomen. IMPRESSION: Tip and side port of the enteric tube below the diaphragm in the stomach. Electronically Signed   By: Narda Rutherford M.D.   On: 07/12/2023 17:06   CT ABDOMEN PELVIS WO CONTRAST  Result Date: 07/12/2023 CLINICAL DATA:  Abdominal pain, acute. EXAM: CT ABDOMEN AND PELVIS WITHOUT  CONTRAST TECHNIQUE: Multidetector CT imaging of the abdomen and pelvis was performed following the standard protocol without IV contrast. RADIATION DOSE REDUCTION: This exam was performed according to the departmental dose-optimization program which includes automated exposure control, adjustment of the mA and/or kV according to patient size and/or use of iterative reconstruction technique. COMPARISON:  Abdominal radiographs 07/12/2023. Abdominal ultrasound 07/06/2023. FINDINGS: Lower chest: Trace bilateral pleural effusions with associated dependent atelectasis at both lung bases. No consolidation or significant pericardial fluid. There is moderate distal esophageal wall thickening which is nonspecific. Hepatobiliary: No suspicious hepatic findings are identified on noncontrast imaging. In the dome of the left hepatic lobe, there is a 9 mm low-density lesion which is likely a cyst. No evidence of gallstones, gallbladder wall thickening or biliary dilatation. Pancreas: Unremarkable. No pancreatic ductal dilatation or surrounding inflammatory changes. Spleen: Normal in size without focal abnormality. Adrenals/Urinary Tract: Both adrenal glands appear normal. No evidence of urinary tract calculus, suspicious renal lesion or hydronephrosis. Mild nonspecific perinephric soft tissue  stranding bilaterally. The bladder appears unremarkable for its degree of distention. Stomach/Bowel: There may be a minimal amount of enteric contrast dependently in the proximal stomach. The stomach and proximal small bowel are not significantly distended. However, as seen on earlier radiographs, there are multiple dilated and fluid-filled loops of mid to distal small bowel and colon. The colon is dilated into the mid descending colon. The sigmoid colon and rectum are relatively decompressed, although did no focal transition point or obvious colonic mass is identified on this noncontrast study. There is no bowel wall thickening or pneumatosis. There is some high density material within the lumen of the appendix, and the appendix appears mildly distended, although demonstrates no wall thickening or focal surrounding inflammation. A rectal tube is in place. Vascular/Lymphatic: There are no enlarged abdominal or pelvic lymph nodes. Aortic and branch vessel atherosclerosis without evidence of aneurysm. Vascular patency not addressed without contrast. Reproductive: The uterus and ovaries appear normal. No adnexal mass. Other: Moderate amount of low-density ascites. There is soft tissue stranding throughout the mesenteric fat. Possible peritoneal nodularity in the right mid abdomen measuring up to 2.3 x 1.2 cm on image 44/2. No pneumoperitoneum or abdominal wall hernia identified. Musculoskeletal: No acute or significant osseous findings. Unless specific follow-up recommendations are mentioned in the findings or impression sections, no imaging follow-up of any mentioned incidental findings is recommended. IMPRESSION: 1. Multiple dilated and fluid-filled loops of mid to distal small bowel and colon with relative decompression of the sigmoid colon and rectum. No focal transition point or obvious colonic mass is identified on this study which is limited by the lack of intravenous and enteric contrast. Findings could be  secondary to an ileus from underlying peritonitis, although are concerning for possible distal colonic obstruction. Recommend further evaluation with colonoscopy. 2. Moderate amount of low-density ascites with soft tissue stranding throughout the mesenteric fat and possible peritoneal nodularity in the right mid abdomen. No evidence of bowel perforation. Cannot exclude peritoneal carcinomatosis. 3. Mildly prominent appendix without evidence of acute inflammation. 4. Trace bilateral pleural effusions with associated dependent atelectasis at both lung bases. 5. Distal esophageal wall thickening, nonspecific, but potentially related to esophagitis. 6.  Aortic Atherosclerosis (ICD10-I70.0). Electronically Signed   By: Carey Bullocks M.D.   On: 07/12/2023 13:24   DG Abd 1 View  Result Date: 07/12/2023 CLINICAL DATA:  Abdominal pain EXAM: ABDOMEN - 1 VIEW COMPARISON:  None Available. FINDINGS: Significantly dilated bowel loops. No definite free air on this limited  supine radiograph. No abnormal calcifications identified although bowel loops limited assessment. Lower lumbar degenerative change. IMPRESSION: Significantly dilated bowel loops which could represent obstruction or ileus. Consider CT of the abdomen/pelvis for further evaluation. Electronically Signed   By: Feliberto Harts M.D.   On: 07/12/2023 08:31   DG CHEST PORT 1 VIEW  Result Date: 07/09/2023 CLINICAL DATA:  Cough, alcohol withdrawal. EXAM: PORTABLE CHEST 1 VIEW COMPARISON:  Chest radiograph 04/08/2023 FINDINGS: The cardiomediastinal silhouette is normal There is no focal consolidation or pulmonary edema. There is no pleural effusion or pneumothorax There is no acute osseous abnormality. IMPRESSION: No radiographic evidence of acute cardiopulmonary process. Electronically Signed   By: Lesia Hausen M.D.   On: 07/09/2023 12:12   US Abdomen Limited RUQ (LIVER/GB)  Result Date: 07/06/2023 CLINICAL DATA:  Transaminitis EXAM: ULTRASOUND ABDOMEN  LIMITED RIGHT UPPER QUADRANT COMPARISON:  None Available. FINDINGS: Gallbladder: No gallstones or wall thickening visualized. No sonographic Murphy sign noted by sonographer. Common bile duct: Diameter: 0.3 cm Liver: No focal lesion identified. Increased parenchymal echogenicity. Portal vein is patent on color Doppler imaging with normal direction of blood flow towards the liver. Other: None. IMPRESSION: 1. Hepatic steatosis. 2. Otherwise unremarkable right upper quadrant ultrasound. Electronically Signed   By: Jearld Lesch M.D.   On: 07/06/2023 14:47    Catarina Hartshorn, DO  [facility]  If 7PM-7AM, please contact night-coverage [email or web address] Password [email or web address] 07/20/2023, 3:13 PM   LOS: 15 days

## 2023-07-20 NOTE — Telephone Encounter (Signed)
Mandy: We need to arrange hospital follow-up in about 2 weeks.  Patient is currently hospitalized, but GI service is signing off.  You will likely need to confirm appointment with family member.

## 2023-07-20 NOTE — Consult Note (Addendum)
PHARMACY - TOTAL PARENTERAL NUTRITION CONSULT NOTE   Indication: Prolonged ileus  Patient Measurements: Height: 5\' 3"  (160 cm) Weight: 63.2 kg (139 lb 5.3 oz) IBW/kg (Calculated) : 52.4 TPN AdjBW (KG): 55.7 Body mass index is 24.68 kg/m. Usual Weight: 67.2 kg  Assessment: TPN was initiated due to prolonged period of no oral intake and prolonged ileus. Patient having BMs, and improved oral intake. Concern for possible splenic phlegmon. F/U reimage of pleen  Glucose / Insulin: BG 158-176 (past 24 hours) 14 units given total Electrolytes: K 4.1> 4.5 Na 130> 132 Phos 4.5 Renal: WNL Hepatic: Albumin 3 Intake / Output; 1858mls/650mls  GI Imaging: 7/28 CT abdomen: Heterogeneous and poorly defined low-attenuation lesion involving the majority of the spleen. Mild diffuse dilatation of small bowel and nondependent colon, most likely due to ileus. 7/24 Abdominal US: Dilation of small-bowel loops in upper and mid abdomen. Gas is present in transverse colon. GI Surgeries / Procedures: 7/23 Flex sigmoidoscopy: Erythematous mucosa in transversa colon. No concerns for necrosis. Dilation in descending and transverse colon. Fluid aspiration performed.  Central access: PICC 7/25 TPN start date: 7/25  Nutritional Goals: Goal TPN rate is 80 mL/hr (provides 92 g of protein and 1731 kcals per day)  RD Assessment: Estimated Needs Total Energy Estimated Needs: 1700-1900 Total Protein Estimated Needs: 85-95 grams Total Fluid Estimated Needs: 1.7-1.9 L/day  Current Nutrition:  NPO  Plan:  No extra external electrolyte supplementation needed this morning.   Continue TPN at 80 mL/hr   Electrolytes in TPN: Na 16mEq/L, K 57mEq/L, Ca 16mEq/L, Mg 66mEq/L, and Phos 42mmol/L. Cl:Ac 1:2 Add standard MVI and trace elements to TPN Continue moderate q6h SSI and adjust as needed  Monitor TPN labs on Mon/Thurs  Elder Cyphers, BS Loura Back, [place] Clinical Pharmacist Pager [phone]  07/20/2023 8:15  AM

## 2023-07-20 NOTE — TOC Progression Note (Signed)
Transition of Care [facility]) - Progression Note    Patient Details  Name: Heidi [name] MRN: [id number] Date of Birth: Dec 23, 1970  [facility]) CM/SW Contact  Elliot Gault, LCSW Phone Number: 07/20/2023, 10:10 AM  Clinical Narrative:     SNF bed search expanded due to pt not receiving any offers from initial search. MD anticipating dc end of week at the earliest. Will follow.    Barriers to Discharge: Continued Medical Work up  Expected Discharge Plan and Services                                               Social Determinants of Health (SDOH) Interventions SDOH Screenings   Food Insecurity: No Food Insecurity (07/05/2023)  Housing: Low Risk  (07/05/2023)  Transportation Needs: No Transportation Needs (07/05/2023)  Utilities: Not At Risk (07/05/2023)  Alcohol Screen: High Risk (05/06/2023)  Depression (PHQ2-9): Medium Risk (05/18/2023)  Tobacco Use: High Risk (07/13/2023)    Readmission Risk Interventions     No data to display

## 2023-07-21 DIAGNOSIS — K56609 Unspecified intestinal obstruction, unspecified as to partial versus complete obstruction: Secondary | ICD-10-CM

## 2023-07-21 DIAGNOSIS — R112 Nausea with vomiting, unspecified: Secondary | ICD-10-CM | POA: Diagnosis not present

## 2023-07-21 DIAGNOSIS — F17200 Nicotine dependence, unspecified, uncomplicated: Secondary | ICD-10-CM | POA: Diagnosis not present

## 2023-07-21 DIAGNOSIS — R7989 Other specified abnormal findings of blood chemistry: Secondary | ICD-10-CM

## 2023-07-21 DIAGNOSIS — F10931 Alcohol use, unspecified with withdrawal delirium: Secondary | ICD-10-CM | POA: Diagnosis not present

## 2023-07-21 DIAGNOSIS — D696 Thrombocytopenia, unspecified: Secondary | ICD-10-CM | POA: Diagnosis not present

## 2023-07-21 DIAGNOSIS — K567 Ileus, unspecified: Secondary | ICD-10-CM | POA: Diagnosis not present

## 2023-07-21 LAB — GLUCOSE, CAPILLARY
Glucose-Capillary: 104 mg/dL — ABNORMAL HIGH (ref 70–99)
Glucose-Capillary: 106 mg/dL — ABNORMAL HIGH (ref 70–99)
Glucose-Capillary: 129 mg/dL — ABNORMAL HIGH (ref 70–99)
Glucose-Capillary: 169 mg/dL — ABNORMAL HIGH (ref 70–99)
Glucose-Capillary: 99 mg/dL (ref 70–99)

## 2023-07-21 MED ORDER — ENSURE ENLIVE PO LIQD
237.0000 mL | Freq: Three times a day (TID) | ORAL | Status: DC
  Administered 2023-07-22 – 2023-07-24 (×6): 237 mL via ORAL

## 2023-07-21 MED ORDER — TRAVASOL 10 % IV SOLN
INTRAVENOUS | Status: DC
  Filled 2023-07-21: qty 921.6

## 2023-07-21 MED ORDER — ADULT MULTIVITAMIN W/MINERALS CH
1.0000 | ORAL_TABLET | Freq: Every day | ORAL | Status: DC
  Administered 2023-07-22 – 2023-07-24 (×3): 1 via ORAL
  Filled 2023-07-21 (×3): qty 1

## 2023-07-21 NOTE — Plan of Care (Signed)

## 2023-07-21 NOTE — Progress Notes (Addendum)
Physical Therapy Treatment Patient Details Name: Heidi [name] MRN: [id number] DOB: 06-11-1971 Today's Date: 07/21/2023   History of Present Illness Heidi [name] is a 52 y.o. female with medical history significant of hypertension, alcohol abuse, tobacco use disorder who presents to the emergency department from home via EMS due to complaints of generalized bodyaches, nausea, vomiting which she thinks was related to alcohol withdrawal.  Patient usually drinks 4 40 ounce beers daily, but she only had 2 12 ounce beers today.  Patient complained of pain in chest, legs, arms and endorsed having had some falls without any injury.  She endorsed a left forearm rash which occurred after being bitten by a yellowjacket on Saturday (2 days ago).    PT Comments  Pt tolerated treatment well. Required min guard assist to transfer and ambulate using a RW. Pt's spirits are high and is showing great functional improvement. Patient will benefit from continued skilled physical therapy in hospital and recommended venue below to increase strength, balance, endurance for safe ADLs and gait.     If plan is discharge home, recommend the following: Help with stairs or ramp for entrance;Assistance with cooking/housework;A little help with walking and/or transfers;A little help with bathing/dressing/bathroom;Assist for transportation   Can travel by private vehicle     No  Equipment Recommendations  Rolling walker (2 wheels)    Recommendations for Other Services       Precautions / Restrictions Precautions Precautions: Fall Restrictions Weight Bearing Restrictions: No     Mobility  Bed Mobility Overal bed mobility: Needs Assistance Bed Mobility: Supine to Sit     Supine to sit: Modified independent (Device/Increase time)     General bed mobility comments: increased time    Transfers Overall transfer level: Needs assistance Equipment used: Rolling walker (2 wheels) Transfers: Sit to/from Stand,  Bed to chair/wheelchair/BSC Sit to Stand: Min guard   Step pivot transfers: Min guard       General transfer comment: using RW    Ambulation/Gait Ambulation/Gait assistance: Min guard Gait Distance (Feet): 100 Feet Assistive device: Rolling walker (2 wheels) Gait Pattern/deviations: Decreased step length - right, Decreased step length - left, Decreased stride length, Knees buckling Gait velocity: slow     General Gait Details: movement slightlyunsteady, min gaurd from therapist, using RW   Stairs             Wheelchair Mobility     Tilt Bed    Modified Rankin (Stroke Patients Only)       Balance Overall balance assessment: Modified Independent Sitting-balance support: No upper extremity supported, Feet supported Sitting balance-Leahy Scale: Good Sitting balance - Comments: seated at EOB   Standing balance support: Reliant on assistive device for balance, During functional activity, Bilateral upper extremity supported Standing balance-Leahy Scale: Fair Standing balance comment: using RW                            Cognition Arousal/Alertness: Awake/alert Behavior During Therapy: WFL for tasks assessed/performed Overall Cognitive Status: Within Functional Limits for tasks assessed                                          Exercises General Exercises - Lower Extremity Ankle Circles/Pumps: Strengthening, Seated, Right, Left, 10 reps, AROM Long Arc Quad: AROM, Seated, Strengthening, Right, Left, 10 reps Hip Flexion/Marching: AROM, Strengthening, Seated,  Right, Left, 10 reps Toe Raises: AROM, Seated, Strengthening, Right, Left, 10 reps Heel Raises: AROM, Seated, Strengthening, Right, Left, 10 reps    General Comments        Pertinent Vitals/Pain Pain Assessment Pain Assessment: No/denies pain    Home Living                          Prior Function            PT Goals (current goals can now be found in the  care plan section) Acute Rehab PT Goals Patient Stated Goal: return home with family to assist PT Goal Formulation: With patient Time For Goal Achievement: 08/02/23 Potential to Achieve Goals: Good Progress towards PT goals: Progressing toward goals    Frequency    Min 3X/week      PT Plan Current plan remains appropriate    Co-evaluation              AM-PAC PT "6 Clicks" Mobility   Outcome Measure  Help needed turning from your back to your side while in a flat bed without using bedrails?: A Little Help needed moving from lying on your back to sitting on the side of a flat bed without using bedrails?: A Little Help needed moving to and from a bed to a chair (including a wheelchair)?: A Little Help needed standing up from a chair using your arms (e.g., wheelchair or bedside chair)?: A Little Help needed to walk in hospital room?: A Little Help needed climbing 3-5 steps with a railing? : A Lot 6 Click Score: 17    End of Session   Activity Tolerance: Patient tolerated treatment well Patient left: with call bell/phone within reach;in bed;with family/visitor present Nurse Communication: Mobility status PT Visit Diagnosis: Unsteadiness on feet (R26.81);Other abnormalities of gait and mobility (R26.89);Muscle weakness (generalized) (M62.81)     Time: 9604-5409 PT Time Calculation (min) (ACUTE ONLY): 25 min  Charges:    $Gait Training: 8-22 mins $Therapeutic Exercise: 8-22 mins PT General Charges $$ ACUTE PT VISIT: 1 Visit                     Aminata Buffalo SPT [place], DPT Program

## 2023-07-21 NOTE — Progress Notes (Signed)
Nutrition Follow-up  DOCUMENTATION CODES:   Not applicable  INTERVENTION:  Encourage PO intake Ensure Enlive po TID, each supplement provides 350 kcal and 20 grams of protein. Magic cup TID with meals, each supplement provides 290 kcal and 9 grams of protein MVI with minerals daily  NUTRITION DIAGNOSIS:   Inadequate oral intake related to inability to eat as evidenced by NPO status. - diet now advanced, remains with inadequate PO intake  GOAL:   Patient will meet greater than or equal to 90% of their needs - goal unmet  MONITOR:   Diet advancement, Labs, Weight trends, I & O's  REASON FOR ASSESSMENT:   NPO/Clear Liquid Diet    ASSESSMENT:   52 year old female with PMHx of HTN, EtOH abuse, tobacco use disorder admitted with generalized body aches, nausea, emesis, EtOH withdrawal. Found to have acute metabolic encephalopathy/delirium tremens, abdominal ileus.  7/15: admitted to hospital with concern for EtOH withdrawal 7/16: ordered for heart healthy diet 7/17: escalated care to ICU and Precedex started for worsening agitation 7/22: CT abd/pelvis with multiple dilated fluid-filled loops of small bowel and colon; pt made NPO 7/23: PCCM consult for agitation and encephalopathy; s/p flex sig with no obstruction noted 7/24: NG tube inserted to LIS 7/25: TPN started   7/29: diet advanced to clear liquid 7/30: TPN discontinued 7/31: diet advanced to full liquid   PICC line removed yesterday. TPN discontinued. Pt with return of bowel function.   Pt laying in bed at time of visit. Pt oriented x4 today. Denies nausea at time of visit. She reports ongoing poor appetite. Also feels that her loose stools are negatively impacting her appetite. She only consumed milk and grape juice from lunch tray today. Reports that the food looks good but just cannot bring herself to eat. Encouraged pt to at least try to take a couple bites from each tray. Expressed importance of nutritional intake  to support healing and energy needs. She is agreeable to protein supplements to augment intake.   Admit weight: 64.1 kg Current weight: 64.6 kg  Medications: dulcolax, folvite, SSI 0-15 units q6h, miralax, senna, thiamine  Labs: sodium 132, AST 73, ALT 73, CBG's 99-203 x24 hours  Diet Order:   Diet Order             Diet full liquid Room service appropriate? Yes; Fluid consistency: Thin  Diet effective now                   EDUCATION NEEDS:   No education needs have been identified at this time  Skin:  Skin Assessment: Reviewed RN Assessment  Last BM:  7/31 (type 7 x4)  Height:   Ht Readings from Last 1 Encounters:  07/05/23 5\' 3"  (1.6 m)    Weight:   Wt Readings from Last 1 Encounters:  07/21/23 64.6 kg    Ideal Body Weight:  52.3 kg  BMI:  Body mass index is 25.23 kg/m.  Estimated Nutritional Needs:   Kcal:  1700-1900  Protein:  85-95 grams  Fluid:  1.7-1.9 L/day  Heidi [name], RDN, LDN Clinical Nutrition

## 2023-07-21 NOTE — Progress Notes (Addendum)
[facility] Progress Note  8 Days Post-Op  Subjective: Patient seen and examined. She is resting comfortably in bed. Reports that her abdominal bloating is still there. She endorses some nausea and says she does not have an appetite. She denies any abdominal pain or vomiting. She continues to have burning pain in her esophagus. Denies chest pain, SOB. She reports passing flatus, and having a couple of Bms yesterday. Patient pulled out PICC line.  Objective: Vital signs in last 24 hours: Temp:  [98.8 F (37.1 C)-99.8 F (37.7 C)] 98.8 F (37.1 C) (07/31 0441) Pulse Rate:  [97-120] 97 (07/31 0600) Resp:  [18-28] 19 (07/31 0600) BP: (108-205)/(63-136) 108/63 (07/31 0600) SpO2:  [92 %-100 %] 96 % (07/31 0600) Weight:  [64.6 kg] 64.6 kg (07/31 0441) Last BM Date : 07/21/23  Intake/Output from previous day: 07/30 0701 - 07/31 0700 In: 4387.1 [P.O.:360; I.V.:3203.1; IV Piggyback:823.9] Out: 650 [Urine:650] Intake/Output this shift: Total I/O In: 1223.4 [I.V.:852; IV Piggyback:371.4] Out: -   General appearance: alert, cooperative, and no distress GI: Abdomen soft, moderate distention, no percussion tenderness, nontender to palpation; no rigidity, guarding, rebound tenderness; no tenderness in left upper quadrant  Lab Results:  Recent Labs    07/20/23 0505 07/21/23 0419  WBC 11.6* 11.2*  HGB 8.8* 8.9*  HCT 26.1* 27.6*  PLT 218 239   BMET Recent Labs    07/19/23 0402 07/20/23 0505  NA 130* 132*  K 4.1 4.5  CL 103 103  CO2 21* 25  GLUCOSE 174* 180*  BUN 8 13  CREATININE 0.45 0.57  CALCIUM 9.7 9.3   PT/INR No results for input(s): "LABPROT", "INR" in the last 72 hours.  Studies/Results: No results found.  Anti-infectives: Anti-infectives (From admission, onward)    Start     Dose/Rate Route Frequency Ordered Stop   07/19/23 1800  vancomycin (VANCOREADY) IVPB 1500 mg/300 mL        1,500 mg 150 mL/hr over 120 Minutes Intravenous Every 24 hours  07/18/23 1840     07/19/23 1500  meropenem (MERREM) 1 g in sodium chloride 0.9 % 100 mL IVPB        1 g 200 mL/hr over 30 Minutes Intravenous Every 8 hours 07/19/23 1352     07/18/23 2200  piperacillin-tazobactam (ZOSYN) IVPB 3.375 g  Status:  Discontinued        3.375 g 12.5 mL/hr over 240 Minutes Intravenous Every 8 hours 07/18/23 1809 07/19/23 1352   07/18/23 1845  vancomycin (VANCOREADY) IVPB 1500 mg/300 mL        1,500 mg 150 mL/hr over 120 Minutes Intravenous STAT 07/18/23 1840 07/19/23 1427   07/16/23 1100  Ampicillin-Sulbactam (UNASYN) 3 g in sodium chloride 0.9 % 100 mL IVPB  Status:  Discontinued        3 g 200 mL/hr over 30 Minutes Intravenous Every 6 hours 07/16/23 0649 07/18/23 1800       Assessment/Plan:  Patient is a 52 year old female who was admitted with alcohol withdrawal. She subsequently developed an adynamic ileus with small and large bowel dilation. She underwent CT of the chest, abdomen, and pelvis 7/28 which demonstrated concern for possible splenic laceration versus abscess versus infarct. Patient HR, fevers and leukocytosis continuing to improve (11.6 -> 11.2). 7/28 blood cxs NG@48h . It is possible that her flexible sigmoidoscopy resulted in splenic laceration. Clinically, her Ileus is continuing to improve and she is doing better and is more alert than she has been during her hospitalization.  -Patient with  continued bloating, continue Maalox -Will potentially reimage patient later this week -Continue broad spectrum antibiotics -Advance to full liquid diet given continued bowel function, with hopes to discontinue TPN in the next couple of days. No need to replace PICC line -If patient's condition changes to the point of needing surgical intervention, she will be unable to undergo surgery at this facility and will require transfer 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 This was discussed with anesthesiology.  -Continue bowel regimen -Electrolyte  replacement -Remainder of care per primary team   LOS: 16 days    Dominica Severin 07/21/2023

## 2023-07-21 NOTE — Progress Notes (Signed)
PROGRESS NOTE  Heidi [name]:[id number] DOB: 17-Sep-1971 DOA: 07/05/2023 PCP: Rica Records, FNP  Brief History:   52 y.o. female with medical history significant of hypertension, alcohol abuse, tobacco use disorder who presents to the emergency department from home via EMS due to complaints of generalized bodyaches, nausea, vomiting which she thinks was related to alcohol withdrawal. Patient usually drinks 4 40 ounce beers daily, but she only had 2 12 ounce beers today.  Patient complained of pain in chest, legs, arms and endorsed having had some falls without any injury.  Her hospitalization has been prolonged secondary to delirium and agitation presumably from alcohol withdrawal.  She has been on Precedex since 07/07/2023.  Attempts at weaning had resulted in increased agitation.  Unfortunately, her hospitalization has been prolonged secondary to development of ileus.  NG was inserted to low intermittent suction on 07/14/2023.  PICC line placed on 07/15/23 and TPN was initiated due to prolonged period of no oral intake and prolonged ileus.   On 07/16/23, pt began showing signs of return of bowel function with 3 BMs in last 24 hours.  She developed a fever up to 102.2.  UA was negative.  Blood cultures were drawn and pt started on empiric unasyn  On 07/17/2023 in the late morning, the patient's Precedex was weaned off.  The patient remained fairly lucid and redirectable. She had one BM in last 24 hours.  TPN continues  On 07/10/2023, the patient continued to have fever up to 101.3 F.  She has had 2 bowel movements in last 24 hours.  She had NG output of 1150, but abdomen was less distended.   Assessment/Plan: Acute metabolic encephalopathy/delirium tremens -Patient has been on Precedex and 07/07/2023 -Attempts at weaning has resulted in increased agitation -Unfortunately, the patient's ileus has limited oral medications to help mitigate her agitation -Continue IV Precedex for  now>>weaned off 7/27 AM -Librium was discontinued -received high-dose thiamine  -07/17/23 Repeat UA 0-5WBC -W11--9147 -Ammonia--25 -Folic acid--8.7 -TSH--2.866 -UDS positive for benzodiazepine -07/14/23--add clonidine TTS-1>>TTS-2 -07/17/23--weaned off precedex around noon -07/20/23 mental status much improved, back to baseline -/731/24: Continue supportive care.  Patient planning no further alcohol use in the future.   Abdominal ileus -07/12/2023 CT AP--multiple dilated fluid-filled loops of SB and colon -Appreciate GI>>possible neostigmine -07/13/2023 flex sig--no obstruction noted--erythematous mucosa in the transverse colon with dilated in the descending, transverse: -07/14/2023--NG inserted to low intermittent suction -Appreciate general surgery and GI assistance -optimize electrolytes -Overall appears to be improving>> remove NG 07/19/2023 -07/18/2023 CT abdomen shows improving ileus -Check C. Difficile--no BM to collect -07/21/23: Diet advanced to full liquid with intention to further increase consistency if tolerated. -TPN has been discontinued after losing PICC line.   Splenic phlegmon/laceration? -Case discussed with general surgery, Dr. Robyne Peers -Case discussed with IR, Dr. Deanne Coffer -Primary considerations at this time--hemorrhage versus infection -Broaden antibiotic coverage for the next few days -Plan to repeat CT abdomen pelvis after 2-3 more days of antibiotics if no improvement clinically to help clarify etiology -fever and WBC improving now with vanc/zosyn -Continue to further advance diet and follow recommendations by general surgery.   Fever -developed fever 102.2 in am 7/26 -fever 101.2 am 7/28 -blood cultures--neg to date -UA neg for pyuria -7/25 CXR personally reviewed--increased interstitial markings -KUB--personally reviewed--persistent bowel dilatation, unchanged -PCT 0.11 -Lactic acid within normal limits. -CT chest/ AP--Heterogeneous and poorly defined  low-attenuation lesion involving the majority of the spleen. Differential  diagnosis includes splenic laceration/hematoma, infarct, phlegmon/developing abscess, or less likely neoplasm. -Continue current IV antibiotics and follow further recommendations by general surgery. (Receiving vancomycin and Zosyn since 07/19/23)   Atrial tachycardia -Continue diltiazem and metoprolol -Follow-up vital signs -Transition meds to oral route when fully tolerating p.o.'s.   FEN -Continue to replete electrolytes as needed -Feeding supplement and diet advancement to be provided -TPN discontinue after losing PICC line and making improvement in oral intake.   Essential hypertension -Irbesartan and diltiazem were initially discontinued secondary to low blood pressure. -Patient blood pressure improved while receiving Cardizem and clonidine -Will also continue IV metoprolol -Continue to follow vital signs -Look to transition medications to oral route when fully tolerating p.o.'s.  Thrombocytopenia -Secondary to myelosuppression from alcohol use -No signs of overt bleeding appreciated -Continue to follow platelet count.   Transaminasemia -In the setting of alcohol abuse -Cessation counseling provided; patient looking to stop drinking -LFTs continue trending down -Continue monitoring.   Tobacco cessation -Cessation counseling provided -Continue nicotine patch.   Hypokalemia -Magnesium within normal limits -Continue to follow electrolytes trend and further replete as needed     Family Communication: Significant other at bedside and updated following patient's permission (07/21/2023).   Consultants:  PCCM   Code Status:  FULL    DVT Prophylaxis:  SCDs     Procedures: As Listed in Progress Note Above   Antibiotics: Unasyn 7/26>>7/28 Vanc 7/28>> Zosyn 7/28>>  Subjective: No chest pain, no nausea, no vomiting, no shortness of breath.  Currently afebrile and tolerating clear liquid diet.   Overall feeling better and demonstrating mentation back to baseline.   Objective: Vitals:   07/21/23 1000 07/21/23 1030 07/21/23 1100 07/21/23 1135  BP: 117/75  124/81   Pulse: (!) 107 (!) 116 (!) 106   Resp:  (!) 27 12   Temp:    98.6 F (37 C)  TempSrc:    Oral  SpO2: 98% 98% 97%   Weight:      Height:        Intake/Output Summary (Last 24 hours) at 07/21/2023 1204 Last data filed at 07/21/2023 0830 Gross per 24 hour  Intake 2126.48 ml  Output --  Net 2126.48 ml   Weight change: 1.4 kg  Exam: General exam: Alert, awake, oriented x 3; reporting passing gas and having loose stools overnight; no nausea, no vomiting, no abdominal pain. Respiratory system: Good movement bilaterally; no using accessory muscles.  Good saturation on room air. Cardiovascular system: Mild sinus tachycardia appreciated; no rubs, no gallops, no JVD. Gastrointestinal system: Abdomen is mildly distended, no guarding, positive bowel sounds. Central nervous system: Alert and oriented. No focal neurological deficits. Extremities: No cyanosis or clubbing. Skin: No petechiae. Psychiatry: Judgement and insight appear normal. Mood & affect appropriate.   Data Reviewed: I have personally reviewed following labs and imaging studies  Basic Metabolic Panel: Recent Labs  Lab 07/15/23 0600 07/16/23 0604 07/17/23 0325 07/18/23 0430 07/19/23 0402 07/20/23 0505 07/21/23 0419  NA 145 141 136 136 130* 132* 132*  K 3.4* 3.5 3.0* 3.6 4.1 4.5 4.3  CL 115* 114* 108 108 103 103 101  CO2 20* 19* 22 25 21* 25 24  GLUCOSE 85 136* 134* 155* 174* 180* 147*  BUN 14 10 7 7 8 13 11   CREATININE 0.74 0.64 0.45 0.49 0.45 0.57 0.53  CALCIUM 9.1 9.0 9.0 9.0 9.7 9.3 9.3  MG 2.0 2.1 1.9 1.9 1.9  --  2.0  PHOS 2.9 3.0 3.6 4.5  --   --   --  Liver Function Tests: Recent Labs  Lab 07/16/23 0604 07/18/23 0430 07/19/23 0402 07/20/23 0505 07/21/23 0419  AST 28 27 30  40 73*  ALT 16 17 25  37 73*  ALKPHOS 80 76 88 78 81   BILITOT 0.7 0.6 0.7 0.2* 0.3  PROT 6.3* 6.2* 7.0 6.4* 7.0  ALBUMIN 2.8* 2.6* 3.0* 2.7* 3.0*   Recent Labs  Lab 07/15/23 0600  AMMONIA 25   CBC: Recent Labs  Lab 07/17/23 1135 07/18/23 0430 07/19/23 0402 07/20/23 0505 07/21/23 0419  WBC 17.4* 17.2* 19.3* 11.6* 11.2*  NEUTROABS  --   --   --  8.2*  --   HGB 9.1* 8.5* 9.5* 8.8* 8.9*  HCT 26.2* 25.4* 28.6* 26.1* 27.6*  MCV 88.5 89.8 90.2 90.9 92.6  PLT 157 173 218 218 239   CBG: Recent Labs  Lab 07/20/23 1803 07/21/23 0005 07/21/23 0528 07/21/23 0710 07/21/23 1125  GLUCAP 203* 169* 129* 106* 104*   Urine analysis:    Component Value Date/Time   COLORURINE YELLOW 07/17/2023 1745   APPEARANCEUR CLEAR 07/17/2023 1745   LABSPEC 1.010 07/17/2023 1745   PHURINE 7.0 07/17/2023 1745   GLUCOSEU NEGATIVE 07/17/2023 1745   HGBUR NEGATIVE 07/17/2023 1745   BILIRUBINUR NEGATIVE 07/17/2023 1745   KETONESUR NEGATIVE 07/17/2023 1745   PROTEINUR NEGATIVE 07/17/2023 1745   UROBILINOGEN 0.2 01/04/2012 1330   NITRITE NEGATIVE 07/17/2023 1745   LEUKOCYTESUR NEGATIVE 07/17/2023 1745   Sepsis Labs:  Recent Results (from the past 240 hour(s))  Culture, blood (Routine X 2) w Reflex to ID Panel     Status: None   Collection Time: 07/15/23 10:08 AM   Specimen: BLOOD RIGHT HAND  Result Value Ref Range Status   Specimen Description BLOOD RIGHT HAND  Final   Special Requests   Final    BOTTLES DRAWN AEROBIC AND ANAEROBIC Blood Culture adequate volume   Culture   Final    NO GROWTH 5 DAYS Performed at [facility], [address] [place], [place] [address]    Report Status 07/20/2023 FINAL  Final  Culture, blood (Routine X 2) w Reflex to ID Panel     Status: None   Collection Time: 07/15/23 10:08 AM   Specimen: BLOOD  Result Value Ref Range Status   Specimen Description BLOOD BLOOD LEFT HAND  Final   Special Requests   Final    BOTTLES DRAWN AEROBIC AND ANAEROBIC Blood Culture adequate volume   Culture   Final    NO GROWTH 5  DAYS Performed at [facility], [address] [place], [place] [address]    Report Status 07/20/2023 FINAL  Final  Culture, blood (Routine X 2) w Reflex to ID Panel     Status: None (Preliminary result)   Collection Time: 07/18/23  6:38 PM   Specimen: BLOOD LEFT FOREARM  Result Value Ref Range Status   Specimen Description BLOOD LEFT FOREARM  Final   Special Requests   Final    BOTTLES DRAWN AEROBIC AND ANAEROBIC Blood Culture results may not be optimal due to an excessive volume of blood received in culture bottles   Culture   Final    NO GROWTH 3 DAYS Performed at [facility], [address], [place], [place] [address]    Report Status PENDING  Incomplete  Culture, blood (Routine X 2) w Reflex to ID Panel     Status: None (Preliminary result)   Collection Time: 07/18/23  6:47 PM   Specimen: BLOOD LEFT HAND  Result Value Ref  Range Status   Specimen Description BLOOD LEFT HAND  Final   Special Requests   Final    BOTTLES DRAWN AEROBIC AND ANAEROBIC BLOOD LEFT HAND   Culture   Final    NO GROWTH 3 DAYS Performed at [facility], [address] [place], [place] [address]    Report Status PENDING  Incomplete     Scheduled Meds:  bisacodyl  10 mg Rectal Daily   Chlorhexidine Gluconate Cloth  6 each Topical Daily   cloNIDine  0.2 mg Transdermal Weekly   folic acid  1 mg Oral Daily   insulin aspart  0-15 Units Subcutaneous Q6H   metoprolol tartrate  7.5 mg Intravenous Q6H   nicotine  14 mg Transdermal Daily   polyethylene glycol  17 g Oral Daily   senna-docusate  1 tablet Oral BID   sodium chloride flush  10-40 mL Intracatheter Q12H   thiamine (VITAMIN B1) injection  100 mg Intravenous Q24H   Continuous Infusions:  dexmedetomidine (PRECEDEX) IV infusion Stopped (07/17/23 1136)   diltiazem (CARDIZEM) infusion 15 mg/hr (07/21/23 0544)   meropenem (MERREM) IV 1 g (07/21/23 0540)   TPN ADULT (ION) 80 mL/hr at 07/20/23 1850   vancomycin 150 mL/hr at 07/20/23 1850     Procedures/Studies: CT ABDOMEN PELVIS W CONTRAST  Result Date: 07/18/2023 CLINICAL DATA:  Fever. Respiratory illness. Abdominal pain. Ileus. Colitis. EXAM: CT CHEST, ABDOMEN, AND PELVIS WITH CONTRAST TECHNIQUE: Multidetector CT imaging of the chest, abdomen and pelvis was performed following the standard protocol during bolus administration of intravenous contrast. RADIATION DOSE REDUCTION: This exam was performed according to the departmental dose-optimization program which includes automated exposure control, adjustment of the mA and/or kV according to patient size and/or use of iterative reconstruction technique. CONTRAST:  OMNIPAQUE IOHEXOL 300 MG/ML  SOLN COMPARISON:  Noncontrast AP CT on 07/12/2023 FINDINGS: CT CHEST FINDINGS Cardiovascular: No acute findings. Mediastinum/Lymph Nodes: No masses or pathologically enlarged lymph nodes identified. Lungs/Pleura: No suspicious pulmonary nodules or masses identified. No No evidence of pulmonary consolidation or central endobronchial obstruction. Small left pleural effusion noted as well as mild dependent atelectasis. Musculoskeletal:  No suspicious bone lesions identified. CT ABDOMEN AND PELVIS FINDINGS Hepatobiliary: No masses identified. Gallbladder is unremarkable. No evidence of biliary ductal dilatation. Pancreas:  No mass or inflammatory changes. Spleen: Within normal limits in size. Heterogeneous and poorly defined low-attenuation lesion is seen involving the majority of the spleen, without perisplenic fluid. Differential diagnosis includes splenic laceration, infarct, abscess, or less likely neoplasm. Adrenals/Urinary tract: No suspicious masses or hydronephrosis. Stomach/Bowel: Nasogastric tube tip in body of stomach. Mild diffuse dilatation of small bowel and nondependent colon, most likely due to ileus. No evidence of obstruction, inflammatory process, or abnormal fluid collections. Normal appendix visualized. Vascular/Lymphatic: No  pathologically enlarged lymph nodes identified. No acute vascular findings. Reproductive: No mass or inflammatory changes identified. Small amount of low-attenuation free fluid seen in pelvic cul-de-sac. Other:  None. Musculoskeletal:  No suspicious bone lesions identified. IMPRESSION: Heterogeneous and poorly defined low-attenuation lesion involving the majority of the spleen. Differential diagnosis includes splenic laceration/hematoma, infarct, phlegmon/developing abscess, or less likely neoplasm. Mild diffuse dilatation of small bowel and nondependent colon, most likely due to ileus. Small left pleural effusion and free pelvic fluid. Critical Value/emergent results were called by telephone at the time of interpretation on 07/18/2023 at 4:46 pm to provider DAVID TAT , who verbally acknowledged these results. Electronically Signed   By: Danae Orleans M.D.   On: 07/18/2023 17:04  CT CHEST W CONTRAST  Result Date: 07/18/2023 CLINICAL DATA:  Fever. Respiratory illness. Abdominal pain. Ileus. Colitis. EXAM: CT CHEST, ABDOMEN, AND PELVIS WITH CONTRAST TECHNIQUE: Multidetector CT imaging of the chest, abdomen and pelvis was performed following the standard protocol during bolus administration of intravenous contrast. RADIATION DOSE REDUCTION: This exam was performed according to the departmental dose-optimization program which includes automated exposure control, adjustment of the mA and/or kV according to patient size and/or use of iterative reconstruction technique. CONTRAST:  OMNIPAQUE IOHEXOL 300 MG/ML  SOLN COMPARISON:  Noncontrast AP CT on 07/12/2023 FINDINGS: CT CHEST FINDINGS Cardiovascular: No acute findings. Mediastinum/Lymph Nodes: No masses or pathologically enlarged lymph nodes identified. Lungs/Pleura: No suspicious pulmonary nodules or masses identified. No No evidence of pulmonary consolidation or central endobronchial obstruction. Small left pleural effusion noted as well as mild dependent  atelectasis. Musculoskeletal:  No suspicious bone lesions identified. CT ABDOMEN AND PELVIS FINDINGS Hepatobiliary: No masses identified. Gallbladder is unremarkable. No evidence of biliary ductal dilatation. Pancreas:  No mass or inflammatory changes. Spleen: Within normal limits in size. Heterogeneous and poorly defined low-attenuation lesion is seen involving the majority of the spleen, without perisplenic fluid. Differential diagnosis includes splenic laceration, infarct, abscess, or less likely neoplasm. Adrenals/Urinary tract: No suspicious masses or hydronephrosis. Stomach/Bowel: Nasogastric tube tip in body of stomach. Mild diffuse dilatation of small bowel and nondependent colon, most likely due to ileus. No evidence of obstruction, inflammatory process, or abnormal fluid collections. Normal appendix visualized. Vascular/Lymphatic: No pathologically enlarged lymph nodes identified. No acute vascular findings. Reproductive: No mass or inflammatory changes identified. Small amount of low-attenuation free fluid seen in pelvic cul-de-sac. Other:  None. Musculoskeletal:  No suspicious bone lesions identified. IMPRESSION: Heterogeneous and poorly defined low-attenuation lesion involving the majority of the spleen. Differential diagnosis includes splenic laceration/hematoma, infarct, phlegmon/developing abscess, or less likely neoplasm. Mild diffuse dilatation of small bowel and nondependent colon, most likely due to ileus. Small left pleural effusion and free pelvic fluid. Critical Value/emergent results were called by telephone at the time of interpretation on 07/18/2023 at 4:46 pm to provider DAVID TAT , who verbally acknowledged these results. Electronically Signed   By: Danae Orleans M.D.   On: 07/18/2023 17:04   DG Abd 1 View  Result Date: 07/18/2023 CLINICAL DATA:  Ileus EXAM: ABDOMEN - 1 VIEW COMPARISON:  07/16/2023 FINDINGS: NG tube remains positioned within the stomach. Decreasing size and number of  dilated small bowel loops. Small bowel dilation now measures up to 4.4 cm (previously 5.4 cm). Persistent gaseous distension of the transverse colon. No gross free intraperitoneal air. IMPRESSION: Decreasing size and number of dilated small bowel loops. Appearance suggests resolving ileus or obstruction. Continued radiographic follow-up is recommended. Electronically Signed   By: Duanne Guess D.O.   On: 07/18/2023 09:42   DG Abd 1 View  Result Date: 07/16/2023 CLINICAL DATA:  Ileus, abdominal distention EXAM: ABDOMEN - 1 VIEW COMPARISON:  Previous studies including the examination of 07/15/2023 FINDINGS: There are dilated small bowel loops measuring up to 5.4 cm in diameter. Small-bowel dilation appears slightly more prominent. Mucosal folds in the small bowel loops appear prominent. Gas is present in right colon. Tip of NG tube is seen in the stomach. IMPRESSION: There is dilation of small-bowel loops along with mucosal thickening. Findings may suggest ileus or enteritis or distal colonic obstruction. Electronically Signed   By: Ernie Avena M.D.   On: 07/16/2023 11:41   DG Abd 1 View  Result Date: 07/15/2023 CLINICAL DATA:  [address] Ileus (HCC) [id number] EXAM: ABDOMEN - 1 VIEW COMPARISON:  07/14/2023 FINDINGS: Enteric tube remains positioned within the stomach. Persistently dilated loops of small bowel within the abdomen, similar to prior. No gross free intraperitoneal air on supine view. IMPRESSION: Persistently dilated loops of small bowel within the abdomen, ileus versus obstruction. Electronically Signed   By: Duanne Guess D.O.   On: 07/15/2023 13:17   DG CHEST PORT 1 VIEW  Result Date: 07/15/2023 CLINICAL DATA:  Fever EXAM: PORTABLE CHEST 1 VIEW COMPARISON:  07/09/2023 FINDINGS: New enteric tube which extends into the stomach. The heart size and mediastinal contours are within normal limits. Both lungs are clear. The visualized skeletal structures are unremarkable. Gaseous distension of  bowel within the imaged upper abdomen. IMPRESSION: 1. No active cardiopulmonary disease. 2. New enteric tube which extends into the stomach. Electronically Signed   By: Duanne Guess D.O.   On: 07/15/2023 13:15   [place] EKG SITE RITE  Result Date: 07/14/2023 If Site Rite image not attached, placement could not be confirmed due to current cardiac rhythm.  DG Abd Portable 1V  Result Date: 07/14/2023 CLINICAL DATA:  NG tube placement EXAM: PORTABLE ABDOMEN - 1 VIEW COMPARISON:  Previous studies including the examination none earlier today FINDINGS: There is interval placement of NG tube with its tip and side port within the stomach. Dilation of small-bowel loops in central abdomen has not changed. Gas is present in ascending and transverse colon. IMPRESSION: Tip of enteric tube is seen in the stomach. No change is seen in small bowel dilation. Electronically Signed   By: Ernie Avena M.D.   On: 07/14/2023 13:11   DG Abd 1 View  Result Date: 07/14/2023 CLINICAL DATA:  Abdominal distention EXAM: ABDOMEN - 1 VIEW COMPARISON:  Previous studies including the examination of 07/13/2023 FINDINGS: There is dilation of small-bowel loops in upper and mid abdomen. Gas is present in transverse colon. There is no demonstrable gas in rectosigmoid. No significant changes are noted in small bowel dilation. IMPRESSION: There is dilation of small bowel loops suggesting possible ileus or partial obstruction. No significant changes are noted. Electronically Signed   By: Ernie Avena M.D.   On: 07/14/2023 13:09   DG Abd 1 View  Result Date: 07/13/2023 CLINICAL DATA:  Follow-up ileus, initial encounter EXAM: ABDOMEN - 1 VIEW COMPARISON:  None Available. FINDINGS: Scattered large and small bowel gas is noted. Multiple dilated loops of small bowel are again noted similar to that seen on the prior exam. No free air is seen. No mass lesion is noted. No bony abnormality is seen. IMPRESSION: Stable small bowel  dilatation centrally. Electronically Signed   By: Alcide Clever M.D.   On: 07/13/2023 20:09   DG Abd 1 View  Result Date: 07/13/2023 CLINICAL DATA:  Abdominal distension. EXAM: ABDOMEN - 1 VIEW COMPARISON:  07/12/2023 FINDINGS: Persistent gaseous distension of bowel loops throughout the abdomen and pelvis. The degree of bowel distension has minimally changed. Upper abdomen is incompletely imaged and not clear if nasogastric tube is still present. Cannot evaluate for free air on the supine image. IMPRESSION: Persistent gaseous distension of small and large bowel loops. Based on the previous CT findings, a distal colonic obstruction cannot be excluded. Bowel gas distension has minimally changed. Electronically Signed   By: Richarda Overlie M.D.   On: 07/13/2023 16:33   DG Abd 1 View  Result Date: 07/12/2023 CLINICAL DATA:  Nasogastric tube placement. EXAM: ABDOMEN - 1 VIEW COMPARISON:  Radiograph and  CT earlier today FINDINGS: Tip and side port of the enteric tube below the diaphragm in the stomach. Gaseous bowel distension persists throughout the abdomen. IMPRESSION: Tip and side port of the enteric tube below the diaphragm in the stomach. Electronically Signed   By: Narda Rutherford M.D.   On: 07/12/2023 17:06   CT ABDOMEN PELVIS WO CONTRAST  Result Date: 07/12/2023 CLINICAL DATA:  Abdominal pain, acute. EXAM: CT ABDOMEN AND PELVIS WITHOUT CONTRAST TECHNIQUE: Multidetector CT imaging of the abdomen and pelvis was performed following the standard protocol without IV contrast. RADIATION DOSE REDUCTION: This exam was performed according to the departmental dose-optimization program which includes automated exposure control, adjustment of the mA and/or kV according to patient size and/or use of iterative reconstruction technique. COMPARISON:  Abdominal radiographs 07/12/2023. Abdominal ultrasound 07/06/2023. FINDINGS: Lower chest: Trace bilateral pleural effusions with associated dependent atelectasis at both lung  bases. No consolidation or significant pericardial fluid. There is moderate distal esophageal wall thickening which is nonspecific. Hepatobiliary: No suspicious hepatic findings are identified on noncontrast imaging. In the dome of the left hepatic lobe, there is a 9 mm low-density lesion which is likely a cyst. No evidence of gallstones, gallbladder wall thickening or biliary dilatation. Pancreas: Unremarkable. No pancreatic ductal dilatation or surrounding inflammatory changes. Spleen: Normal in size without focal abnormality. Adrenals/Urinary Tract: Both adrenal glands appear normal. No evidence of urinary tract calculus, suspicious renal lesion or hydronephrosis. Mild nonspecific perinephric soft tissue stranding bilaterally. The bladder appears unremarkable for its degree of distention. Stomach/Bowel: There may be a minimal amount of enteric contrast dependently in the proximal stomach. The stomach and proximal small bowel are not significantly distended. However, as seen on earlier radiographs, there are multiple dilated and fluid-filled loops of mid to distal small bowel and colon. The colon is dilated into the mid descending colon. The sigmoid colon and rectum are relatively decompressed, although did no focal transition point or obvious colonic mass is identified on this noncontrast study. There is no bowel wall thickening or pneumatosis. There is some high density material within the lumen of the appendix, and the appendix appears mildly distended, although demonstrates no wall thickening or focal surrounding inflammation. A rectal tube is in place. Vascular/Lymphatic: There are no enlarged abdominal or pelvic lymph nodes. Aortic and branch vessel atherosclerosis without evidence of aneurysm. Vascular patency not addressed without contrast. Reproductive: The uterus and ovaries appear normal. No adnexal mass. Other: Moderate amount of low-density ascites. There is soft tissue stranding throughout the  mesenteric fat. Possible peritoneal nodularity in the right mid abdomen measuring up to 2.3 x 1.2 cm on image 44/2. No pneumoperitoneum or abdominal wall hernia identified. Musculoskeletal: No acute or significant osseous findings. Unless specific follow-up recommendations are mentioned in the findings or impression sections, no imaging follow-up of any mentioned incidental findings is recommended. IMPRESSION: 1. Multiple dilated and fluid-filled loops of mid to distal small bowel and colon with relative decompression of the sigmoid colon and rectum. No focal transition point or obvious colonic mass is identified on this study which is limited by the lack of intravenous and enteric contrast. Findings could be secondary to an ileus from underlying peritonitis, although are concerning for possible distal colonic obstruction. Recommend further evaluation with colonoscopy. 2. Moderate amount of low-density ascites with soft tissue stranding throughout the mesenteric fat and possible peritoneal nodularity in the right mid abdomen. No evidence of bowel perforation. Cannot exclude peritoneal carcinomatosis. 3. Mildly prominent appendix without evidence of acute inflammation. 4. Trace  bilateral pleural effusions with associated dependent atelectasis at both lung bases. 5. Distal esophageal wall thickening, nonspecific, but potentially related to esophagitis. 6.  Aortic Atherosclerosis (ICD10-I70.0). Electronically Signed   By: Carey Bullocks M.D.   On: 07/12/2023 13:24   DG Abd 1 View  Result Date: 07/12/2023 CLINICAL DATA:  Abdominal pain EXAM: ABDOMEN - 1 VIEW COMPARISON:  None Available. FINDINGS: Significantly dilated bowel loops. No definite free air on this limited supine radiograph. No abnormal calcifications identified although bowel loops limited assessment. Lower lumbar degenerative change. IMPRESSION: Significantly dilated bowel loops which could represent obstruction or ileus. Consider CT of the  abdomen/pelvis for further evaluation. Electronically Signed   By: Feliberto Harts M.D.   On: 07/12/2023 08:31   DG CHEST PORT 1 VIEW  Result Date: 07/09/2023 CLINICAL DATA:  Cough, alcohol withdrawal. EXAM: PORTABLE CHEST 1 VIEW COMPARISON:  Chest radiograph 04/08/2023 FINDINGS: The cardiomediastinal silhouette is normal There is no focal consolidation or pulmonary edema. There is no pleural effusion or pneumothorax There is no acute osseous abnormality. IMPRESSION: No radiographic evidence of acute cardiopulmonary process. Electronically Signed   By: Lesia Hausen M.D.   On: 07/09/2023 12:12   US Abdomen Limited RUQ (LIVER/GB)  Result Date: 07/06/2023 CLINICAL DATA:  Transaminitis EXAM: ULTRASOUND ABDOMEN LIMITED RIGHT UPPER QUADRANT COMPARISON:  None Available. FINDINGS: Gallbladder: No gallstones or wall thickening visualized. No sonographic Murphy sign noted by sonographer. Common bile duct: Diameter: 0.3 cm Liver: No focal lesion identified. Increased parenchymal echogenicity. Portal vein is patent on color Doppler imaging with normal direction of blood flow towards the liver. Other: None. IMPRESSION: 1. Hepatic steatosis. 2. Otherwise unremarkable right upper quadrant ultrasound. Electronically Signed   By: Jearld Lesch M.D.   On: 07/06/2023 14:47    Vassie Loll, MD  [facility]  If 7PM-7AM, please contact night-coverage [email or web address] Password [email or web address] 07/21/2023, 12:04 PM   LOS: 16 days

## 2023-07-21 NOTE — Consult Note (Addendum)
PHARMACY - TOTAL PARENTERAL NUTRITION CONSULT NOTE   Indication: Prolonged ileus  Patient Measurements: Height: 5\' 3"  (160 cm) Weight: 64.6 kg (142 lb 6.7 oz) IBW/kg (Calculated) : 52.4 TPN AdjBW (KG): 55.7 Body mass index is 25.23 kg/m. Usual Weight: 67.2 kg  Assessment: TPN was initiated due to prolonged period of no oral intake and prolonged ileus. Patient having BMs, and improved oral intake. Concern for possible splenic phlegmon. NGT removed and slow to take orals. Abdomen distended, continue with TPN. Taking clear liquids, plan to d/c TPN once taking po's well  Glucose / Insulin: BG 106-203 (past 24 hours) 15 units given total Electrolytes: K 4.1> 4.5> 4.3 Na 130> 132 Phos 4.5 Renal: WNL Hepatic: Albumin 3 Intake / Output; 4360mls/650mls  GI Imaging: 7/28 CT abdomen: Heterogeneous and poorly defined low-attenuation lesion involving the majority of the spleen. Mild diffuse dilatation of small bowel and nondependent colon, most likely due to ileus. 7/24 Abdominal US: Dilation of small-bowel loops in upper and mid abdomen. Gas is present in transverse colon. GI Surgeries / Procedures: 7/23 Flex sigmoidoscopy: Erythematous mucosa in transversa colon. No concerns for necrosis. Dilation in descending and transverse colon. Fluid aspiration performed.  Central access: PICC 7/25 TPN start date: 7/25  Nutritional Goals: Goal TPN rate is 80 mL/hr (provides 92 g of protein and 1731 kcals per day)  RD Assessment: Estimated Needs Total Energy Estimated Needs: 1700-1900 Total Protein Estimated Needs: 85-95 grams Total Fluid Estimated Needs: 1.7-1.9 L/day  Current Nutrition:  NPO  Plan:  Continue TPN at 80 mL/hr   Electrolytes in TPN: Na 36mEq/L, K 66mEq/L, Ca 35mEq/L, Mg 95mEq/L, and Phos 60mmol/L. Cl:Ac 1:2 Add standard MVI and trace elements to TPN Continue moderate q6h SSI and adjust as needed  Monitor TPN labs on Mon/Thurs  Elder Cyphers, BS Pharm D, BCPS Clinical  Pharmacist 07/21/2023 8:02 AM  Addendum: PICC line pulled out, so TPN discontinued. Plan not to reinsert and monitor.

## 2023-07-21 NOTE — Plan of Care (Signed)

## 2023-07-22 ENCOUNTER — Inpatient Hospital Stay (HOSPITAL_COMMUNITY): Payer: Medicaid Other

## 2023-07-22 DIAGNOSIS — F10931 Alcohol use, unspecified with withdrawal delirium: Secondary | ICD-10-CM | POA: Diagnosis not present

## 2023-07-22 DIAGNOSIS — F17200 Nicotine dependence, unspecified, uncomplicated: Secondary | ICD-10-CM | POA: Diagnosis not present

## 2023-07-22 DIAGNOSIS — K567 Ileus, unspecified: Secondary | ICD-10-CM | POA: Diagnosis not present

## 2023-07-22 DIAGNOSIS — D696 Thrombocytopenia, unspecified: Secondary | ICD-10-CM | POA: Diagnosis not present

## 2023-07-22 DIAGNOSIS — R112 Nausea with vomiting, unspecified: Secondary | ICD-10-CM | POA: Diagnosis not present

## 2023-07-22 LAB — GLUCOSE, CAPILLARY
Glucose-Capillary: 107 mg/dL — ABNORMAL HIGH (ref 70–99)
Glucose-Capillary: 114 mg/dL — ABNORMAL HIGH (ref 70–99)
Glucose-Capillary: 82 mg/dL (ref 70–99)
Glucose-Capillary: 86 mg/dL (ref 70–99)
Glucose-Capillary: 90 mg/dL (ref 70–99)
Glucose-Capillary: 94 mg/dL (ref 70–99)

## 2023-07-22 MED ORDER — DILTIAZEM HCL 30 MG PO TABS
45.0000 mg | ORAL_TABLET | Freq: Three times a day (TID) | ORAL | Status: DC
  Administered 2023-07-22 – 2023-07-24 (×5): 45 mg via ORAL
  Filled 2023-07-22 (×5): qty 2

## 2023-07-22 MED ORDER — IOHEXOL 300 MG/ML  SOLN
100.0000 mL | Freq: Once | INTRAMUSCULAR | Status: AC | PRN
  Administered 2023-07-22: 100 mL via INTRAVENOUS

## 2023-07-22 NOTE — Progress Notes (Signed)
PROGRESS NOTE  Heidi [name] MVH:[id number] DOB: [birth year] DOA: 07/05/2023 PCP: Rica Records, FNP  Brief History:   52 y.o. female with medical history significant of hypertension, alcohol abuse, tobacco use disorder who presents to the emergency department from home via EMS due to complaints of generalized bodyaches, nausea, vomiting which she thinks was related to alcohol withdrawal. Patient usually drinks 4 40 ounce beers daily, but she only had 2 12 ounce beers today.  Patient complained of pain in chest, legs, arms and endorsed having had some falls without any injury.  Her hospitalization has been prolonged secondary to delirium and agitation presumably from alcohol withdrawal.  She has been on Precedex since 07/07/2023.  Attempts at weaning had resulted in increased agitation.  Unfortunately, her hospitalization has been prolonged secondary to development of ileus.  NG was inserted to low intermittent suction on 07/14/2023.  PICC line placed on 07/15/23 and TPN was initiated due to prolonged period of no oral intake and prolonged ileus.   On 07/16/23, pt began showing signs of return of bowel function with 3 BMs in last 24 hours.  She developed a fever up to 102.2.  UA was negative.  Blood cultures were drawn and pt started on empiric unasyn  On 07/17/2023 in the late morning, the patient's Precedex was weaned off.  The patient remained fairly lucid and redirectable. She had one BM in last 24 hours.  TPN continues  On 07/10/2023, the patient continued to have fever up to 101.3 F.  She has had 2 bowel movements in last 24 hours.  She had NG output of 1150, but abdomen was less distended.   Assessment/Plan: Acute metabolic encephalopathy/delirium tremens -Patient has been on Precedex and 07/07/2023 -Attempts at weaning has resulted in increased agitation -Unfortunately, the patient's ileus has limited oral medications to help mitigate her agitation -Continue IV Precedex for  now>>weaned off 7/27 AM -Librium was discontinued -received high-dose thiamine  -07/17/23 Repeat UA 0-5WBC -W41--3244 -Ammonia--25 -Folic acid--8.7 -TSH--2.866 -UDS positive for benzodiazepine -07/14/23--add clonidine TTS-1>>TTS-2 -07/17/23--weaned off precedex around noon -07/20/23 mental status much improved, back to baseline -/731/24: Continue supportive care.  Patient planning no further alcohol use in the future.   Abdominal ileus -07/12/2023 CT AP--multiple dilated fluid-filled loops of SB and colon -Appreciate GI>>possible neostigmine -07/13/2023 flex sig--no obstruction noted--erythematous mucosa in the transverse colon with dilated in the descending, transverse: -07/14/2023--NG inserted to low intermittent suction -Appreciate general surgery and GI assistance -optimize electrolytes -Overall appears to be improving>> remove NG 07/19/2023 -07/18/2023 CT abdomen shows improving ileus -Check C. Difficile--no BM to collect -07/21/23: Diet advanced to full liquid with intention to further increase consistency if tolerated. -TPN has been discontinued after losing PICC line.   Splenic phlegmon/laceration? -Case discussed with general surgery, Dr. Robyne Peers -Case discussed with IR, Dr. Deanne Coffer -Primary considerations at this time--hemorrhage versus infection -Continue IV meropenem. -Continue to further advance diet and follow recommendations by general surgery. -Repeat CT scan to follow stability of abnormal spleen findings.   Fever -developed fever 102.2 in am 7/26 -fever 101.2 am 7/28 -blood cultures--neg to date -UA neg for pyuria -7/25 CXR personally reviewed--increased interstitial markings -KUB--personally reviewed--persistent bowel dilatation, unchanged -PCT 0.11 -Lactic acid within normal limits. -CT chest/ AP--Heterogeneous and poorly defined low-attenuation lesion involving the majority of the spleen. Differential diagnosis includes splenic laceration/hematoma, infarct,  phlegmon/developing abscess, or less likely neoplasm. -Continue current IV antibiotics and follow further recommendations by general surgery. (  Planning to discontinue vancomycin and continue treatment with meropenem).   Atrial tachycardia -Continue diltiazem and metoprolol -Follow-up vital signs -Transition meds to oral route when fully tolerating p.o.'s.   FEN -Continue to replete electrolytes as needed -Feeding supplement and diet advancement to be provided -TPN discontinue after losing PICC line and making improvement in oral intake.   Essential hypertension -Irbesartan and diltiazem were initially discontinued secondary to low blood pressure. -Patient blood pressure improved while receiving Cardizem and clonidine -Will also continue IV metoprolol -Continue to follow vital signs -Look to transition medications to oral route when fully tolerating p.o.'s.  Thrombocytopenia -Secondary to myelosuppression from alcohol use -No signs of overt bleeding appreciated -Continue to follow platelet count.   Transaminasemia -In the setting of alcohol abuse -Cessation counseling provided; patient looking to stop drinking -LFTs continue trending down -Continue monitoring.   Tobacco cessation -Cessation counseling provided -Continue nicotine patch.   Hypokalemia -Magnesium within normal limits -Continue to follow electrolytes trend and further replete as needed     Family Communication: Significant other at bedside and updated following patient's permission (07/21/2023).   Consultants:  PCCM   Code Status:  FULL    DVT Prophylaxis:  SCDs     Procedures: As Listed in Progress Note Above   Antibiotics: Unasyn 7/26>>7/28 Vanc 7/28>> Zosyn 7/28>>  Subjective: Tolerating full liquid diet; no chest pain, no nausea, no vomiting.  Patient expressed no difficulty breathing and is currently afebrile.   Objective: Vitals:   07/22/23 1000 07/22/23 1300 07/22/23 1400 07/22/23 1609   BP: 97/71 (!) 148/84 137/81   Pulse: 96 96 93   Resp: 17     Temp:      TempSrc:      SpO2: 99% 98% 99% 100%  Weight:      Height:        Intake/Output Summary (Last 24 hours) at 07/22/2023 1737 Last data filed at 07/22/2023 1430 Gross per 24 hour  Intake 388.62 ml  Output --  Net 388.62 ml   Weight change: 1.9 kg  Exam: General exam: Alert, awake, oriented x 3; no chest pain, no nausea, no vomiting, no shortness of breath.  Feeling better and expressing good tolerance to full liquid diet. Respiratory system: No wheezing or crackles; positive scattered rhonchi at.  No using accessory muscle.  Good saturation on room air. Cardiovascular system: Mild sinus tachycardia (mainly with exertion), no rubs, no gallops, no JVD. Gastrointestinal system: Abdomen is mildly distended, no guarding, positive bowel sounds. Central nervous system: Alert and oriented. No focal neurological deficits. Extremities: No cyanosis or clubbing. Skin: No petechiae. Psychiatry: Judgement and insight appear normal. Mood & affect appropriate.   Data Reviewed: I have personally reviewed following labs and imaging studies  Basic Metabolic Panel: Recent Labs  Lab 07/16/23 0604 07/17/23 0325 07/18/23 0430 07/19/23 0402 07/20/23 0505 07/21/23 0419  NA 141 136 136 130* 132* 132*  K 3.5 3.0* 3.6 4.1 4.5 4.3  CL 114* 108 108 103 103 101  CO2 19* 22 25 21* 25 24  GLUCOSE 136* 134* 155* 174* 180* 147*  BUN 10 7 7 8 13 11   CREATININE 0.64 0.45 0.49 0.45 0.57 0.53  CALCIUM 9.0 9.0 9.0 9.7 9.3 9.3  MG 2.1 1.9 1.9 1.9  --  2.0  PHOS 3.0 3.6 4.5  --   --   --    Liver Function Tests: Recent Labs  Lab 07/16/23 0604 07/18/23 0430 07/19/23 0402 07/20/23 0505 07/21/23 0419  AST 28 27  30 40 73*  ALT 16 17 25  37 73*  ALKPHOS 80 76 88 78 81  BILITOT 0.7 0.6 0.7 0.2* 0.3  PROT 6.3* 6.2* 7.0 6.4* 7.0  ALBUMIN 2.8* 2.6* 3.0* 2.7* 3.0*   No results for input(s): "AMMONIA" in the last 168  hours.  CBC: Recent Labs  Lab 07/17/23 1135 07/18/23 0430 07/19/23 0402 07/20/23 0505 07/21/23 0419  WBC 17.4* 17.2* 19.3* 11.6* 11.2*  NEUTROABS  --   --   --  8.2*  --   HGB 9.1* 8.5* 9.5* 8.8* 8.9*  HCT 26.2* 25.4* 28.6* 26.1* 27.6*  MCV 88.5 89.8 90.2 90.9 92.6  PLT 157 173 218 218 239   CBG: Recent Labs  Lab 07/22/23 0002 07/22/23 0519 07/22/23 0725 07/22/23 1121 07/22/23 1713  GLUCAP 86 82 107* 114* 94   Urine analysis:    Component Value Date/Time   COLORURINE YELLOW 07/17/2023 1745   APPEARANCEUR CLEAR 07/17/2023 1745   LABSPEC 1.010 07/17/2023 1745   PHURINE 7.0 07/17/2023 1745   GLUCOSEU NEGATIVE 07/17/2023 1745   HGBUR NEGATIVE 07/17/2023 1745   BILIRUBINUR NEGATIVE 07/17/2023 1745   KETONESUR NEGATIVE 07/17/2023 1745   PROTEINUR NEGATIVE 07/17/2023 1745   UROBILINOGEN 0.2 01/04/2012 1330   NITRITE NEGATIVE 07/17/2023 1745   LEUKOCYTESUR NEGATIVE 07/17/2023 1745   Sepsis Labs:  Recent Results (from the past 240 hour(s))  Culture, blood (Routine X 2) w Reflex to ID Panel     Status: None   Collection Time: 07/15/23 10:08 AM   Specimen: BLOOD RIGHT HAND  Result Value Ref Range Status   Specimen Description BLOOD RIGHT HAND  Final   Special Requests   Final    BOTTLES DRAWN AEROBIC AND ANAEROBIC Blood Culture adequate volume   Culture   Final    NO GROWTH 5 DAYS Performed at [facility], [address] [place], [place] [address]    Report Status 07/20/2023 FINAL  Final  Culture, blood (Routine X 2) w Reflex to ID Panel     Status: None   Collection Time: 07/15/23 10:08 AM   Specimen: BLOOD  Result Value Ref Range Status   Specimen Description BLOOD BLOOD LEFT HAND  Final   Special Requests   Final    BOTTLES DRAWN AEROBIC AND ANAEROBIC Blood Culture adequate volume   Culture   Final    NO GROWTH 5 DAYS Performed at [facility], [address], [place], [place] [address]    Report Status 07/20/2023 FINAL  Final  Culture, blood (Routine X  2) w Reflex to ID Panel     Status: None (Preliminary result)   Collection Time: 07/18/23  6:38 PM   Specimen: BLOOD LEFT FOREARM  Result Value Ref Range Status   Specimen Description BLOOD LEFT FOREARM  Final   Special Requests   Final    BOTTLES DRAWN AEROBIC AND ANAEROBIC Blood Culture results may not be optimal due to an excessive volume of blood received in culture bottles   Culture   Final    NO GROWTH 4 DAYS Performed at [facility], [address], [place], [place] [address]    Report Status PENDING  Incomplete  Culture, blood (Routine X 2) w Reflex to ID Panel     Status: None (Preliminary result)   Collection Time: 07/18/23  6:47 PM   Specimen: BLOOD LEFT HAND  Result Value Ref Range Status   Specimen Description BLOOD LEFT HAND  Final   Special Requests   Final  BOTTLES DRAWN AEROBIC AND ANAEROBIC BLOOD LEFT HAND   Culture   Final    NO GROWTH 4 DAYS Performed at [facility], [address], [place], [place] [address]    Report Status PENDING  Incomplete     Scheduled Meds:  Chlorhexidine Gluconate Cloth  6 each Topical Daily   cloNIDine  0.2 mg Transdermal Weekly   diltiazem  45 mg Oral Q8H   feeding supplement  237 mL Oral TID BM   folic acid  1 mg Oral Daily   insulin aspart  0-15 Units Subcutaneous Q6H   metoprolol tartrate  7.5 mg Intravenous Q6H   multivitamin with minerals  1 tablet Oral Daily   nicotine  14 mg Transdermal Daily   polyethylene glycol  17 g Oral Daily   senna-docusate  1 tablet Oral BID   sodium chloride flush  10-40 mL Intracatheter Q12H   thiamine (VITAMIN B1) injection  100 mg Intravenous Q24H   Continuous Infusions:  meropenem (MERREM) IV 1 g (07/22/23 1439)    Procedures/Studies: CT ABDOMEN PELVIS W CONTRAST  Result Date: 07/22/2023 CLINICAL DATA:  Abdominal pain. Ileus. Follow-up splenic laceration. EXAM: CT ABDOMEN AND PELVIS WITH CONTRAST TECHNIQUE: Multidetector CT imaging of the abdomen and pelvis was performed using the  standard protocol following bolus administration of intravenous contrast. RADIATION DOSE REDUCTION: This exam was performed according to the departmental dose-optimization program which includes automated exposure control, adjustment of the mA and/or kV according to patient size and/or use of iterative reconstruction technique. CONTRAST:  OMNIPAQUE IOHEXOL 300 MG/ML  SOLN COMPARISON:  07/18/2023 FINDINGS: Lower Chest: No acute findings. Previously seen small pleural effusions have resolved. Hepatobiliary: No suspicious hepatic masses identified. Gallbladder is unremarkable. No evidence of biliary ductal dilatation. Pancreas:  No mass or inflammatory changes. Spleen: Stable appearance of complex low-attenuation lesion involving the majority of the spleen. This has somewhat linear or geographic margins in some locations, suggesting splenic laceration/hematoma or infarct, with infection or neoplasm considered less likely. No evidence of perisplenic hematoma or hemoperitoneum. Adrenals/Urinary Tract: No suspicious masses identified. No evidence of ureteral calculi or hydronephrosis. Stomach/Bowel: Generalized mild dilatation of small bowel and nondependent colon is seen, mildly increased in suspicious for adynamic ileus. No evidence of obstruction, focal inflammatory process or abnormal fluid collections. No No evidence of free intraperitoneal air. Vascular/Lymphatic: No pathologically enlarged lymph nodes. No acute vascular findings. Reproductive: Tiny less than 1 cm posterior uterine fibroid noted. Otherwise unremarkable. Other:  None. Musculoskeletal:  No suspicious bone lesions identified. IMPRESSION: Stable appearance of complex low-attenuation lesion involving the majority of the spleen. Most likely differential diagnosis is splenic laceration or infarct, with infection or neoplasm considered less likely. No evidence of perisplenic hematoma or hemoperitoneum. Increased mild dilatation of small bowel and  nondependent colon, suspicious for adynamic ileus. Tiny less than 1 cm posterior uterine fibroid. Electronically Signed   By: Danae Orleans M.D.   On: 07/22/2023 13:59   CT ABDOMEN PELVIS W CONTRAST  Result Date: 07/18/2023 CLINICAL DATA:  Fever. Respiratory illness. Abdominal pain. Ileus. Colitis. EXAM: CT CHEST, ABDOMEN, AND PELVIS WITH CONTRAST TECHNIQUE: Multidetector CT imaging of the chest, abdomen and pelvis was performed following the standard protocol during bolus administration of intravenous contrast. RADIATION DOSE REDUCTION: This exam was performed according to the departmental dose-optimization program which includes automated exposure control, adjustment of the mA and/or kV according to patient size and/or use of iterative reconstruction technique. CONTRAST:  OMNIPAQUE IOHEXOL 300 MG/ML  SOLN COMPARISON:  Noncontrast AP CT on 07/12/2023 FINDINGS: CT CHEST FINDINGS Cardiovascular: No acute findings. Mediastinum/Lymph Nodes: No masses or pathologically enlarged lymph nodes identified. Lungs/Pleura: No suspicious pulmonary nodules or masses identified. No No evidence of pulmonary consolidation or central endobronchial obstruction. Small left pleural effusion noted as well as mild dependent atelectasis. Musculoskeletal:  No suspicious bone lesions identified. CT ABDOMEN AND PELVIS FINDINGS Hepatobiliary: No masses identified. Gallbladder is unremarkable. No evidence of biliary ductal dilatation. Pancreas:  No mass or inflammatory changes. Spleen: Within normal limits in size. Heterogeneous and poorly defined low-attenuation lesion is seen involving the majority of the spleen, without perisplenic fluid. Differential diagnosis includes splenic laceration, infarct, abscess, or less likely neoplasm. Adrenals/Urinary tract: No suspicious masses or hydronephrosis. Stomach/Bowel: Nasogastric tube tip in body of stomach. Mild diffuse dilatation of small bowel and nondependent colon, most likely due to  ileus. No evidence of obstruction, inflammatory process, or abnormal fluid collections. Normal appendix visualized. Vascular/Lymphatic: No pathologically enlarged lymph nodes identified. No acute vascular findings. Reproductive: No mass or inflammatory changes identified. Small amount of low-attenuation free fluid seen in pelvic cul-de-sac. Other:  None. Musculoskeletal:  No suspicious bone lesions identified. IMPRESSION: Heterogeneous and poorly defined low-attenuation lesion involving the majority of the spleen. Differential diagnosis includes splenic laceration/hematoma, infarct, phlegmon/developing abscess, or less likely neoplasm. Mild diffuse dilatation of small bowel and nondependent colon, most likely due to ileus. Small left pleural effusion and free pelvic fluid. Critical Value/emergent results were called by telephone at the time of interpretation on 07/18/2023 at 4:46 pm to provider DAVID TAT , who verbally acknowledged these results. Electronically Signed   By: Danae Orleans M.D.   On: 07/18/2023 17:04   CT CHEST W CONTRAST  Result Date: 07/18/2023 CLINICAL DATA:  Fever. Respiratory illness. Abdominal pain. Ileus. Colitis. EXAM: CT CHEST, ABDOMEN, AND PELVIS WITH CONTRAST TECHNIQUE: Multidetector CT imaging of the chest, abdomen and pelvis was performed following the standard protocol during bolus administration of intravenous contrast. RADIATION DOSE REDUCTION: This exam was performed according to the departmental dose-optimization program which includes automated exposure control, adjustment of the mA and/or kV according to patient size and/or use of iterative reconstruction technique. CONTRAST:  OMNIPAQUE IOHEXOL 300 MG/ML  SOLN COMPARISON:  Noncontrast AP CT on 07/12/2023 FINDINGS: CT CHEST FINDINGS Cardiovascular: No acute findings. Mediastinum/Lymph Nodes: No masses or pathologically enlarged lymph nodes identified. Lungs/Pleura: No suspicious pulmonary nodules or masses identified. No No  evidence of pulmonary consolidation or central endobronchial obstruction. Small left pleural effusion noted as well as mild dependent atelectasis. Musculoskeletal:  No suspicious bone lesions identified. CT ABDOMEN AND PELVIS FINDINGS Hepatobiliary: No masses identified. Gallbladder is unremarkable. No evidence of biliary ductal dilatation. Pancreas:  No mass or inflammatory changes. Spleen: Within normal limits in size. Heterogeneous and poorly defined low-attenuation lesion is seen involving the majority of the spleen, without perisplenic fluid. Differential diagnosis includes splenic laceration, infarct, abscess, or less likely neoplasm. Adrenals/Urinary tract: No suspicious masses or hydronephrosis. Stomach/Bowel: Nasogastric tube tip in body of stomach. Mild diffuse dilatation of small bowel and nondependent colon, most likely due to ileus. No evidence of obstruction, inflammatory process, or abnormal fluid collections. Normal appendix visualized. Vascular/Lymphatic: No pathologically enlarged lymph nodes identified. No acute vascular findings. Reproductive: No mass or inflammatory changes identified. Small amount of low-attenuation free fluid seen in pelvic cul-de-sac. Other:  None. Musculoskeletal:  No suspicious bone lesions identified. IMPRESSION: Heterogeneous and poorly defined low-attenuation lesion involving the majority of the spleen. Differential diagnosis includes splenic laceration/hematoma, infarct,  phlegmon/developing abscess, or less likely neoplasm. Mild diffuse dilatation of small bowel and nondependent colon, most likely due to ileus. Small left pleural effusion and free pelvic fluid. Critical Value/emergent results were called by telephone at the time of interpretation on 07/18/2023 at 4:46 pm to provider DAVID TAT , who verbally acknowledged these results. Electronically Signed   By: Danae Orleans M.D.   On: 07/18/2023 17:04   DG Abd 1 View  Result Date: 07/18/2023 CLINICAL DATA:  Ileus  EXAM: ABDOMEN - 1 VIEW COMPARISON:  07/16/2023 FINDINGS: NG tube remains positioned within the stomach. Decreasing size and number of dilated small bowel loops. Small bowel dilation now measures up to 4.4 cm (previously 5.4 cm). Persistent gaseous distension of the transverse colon. No gross free intraperitoneal air. IMPRESSION: Decreasing size and number of dilated small bowel loops. Appearance suggests resolving ileus or obstruction. Continued radiographic follow-up is recommended. Electronically Signed   By: Duanne Guess D.O.   On: 07/18/2023 09:42   DG Abd 1 View  Result Date: 07/16/2023 CLINICAL DATA:  Ileus, abdominal distention EXAM: ABDOMEN - 1 VIEW COMPARISON:  Previous studies including the examination of 07/15/2023 FINDINGS: There are dilated small bowel loops measuring up to 5.4 cm in diameter. Small-bowel dilation appears slightly more prominent. Mucosal folds in the small bowel loops appear prominent. Gas is present in right colon. Tip of NG tube is seen in the stomach. IMPRESSION: There is dilation of small-bowel loops along with mucosal thickening. Findings may suggest ileus or enteritis or distal colonic obstruction. Electronically Signed   By: Ernie Avena M.D.   On: 07/16/2023 11:41   DG Abd 1 View  Result Date: 07/15/2023 CLINICAL DATA:  [id number] Ileus [facility]) [id number] EXAM: ABDOMEN - 1 VIEW COMPARISON:  07/14/2023 FINDINGS: Enteric tube remains positioned within the stomach. Persistently dilated loops of small bowel within the abdomen, similar to prior. No gross free intraperitoneal air on supine view. IMPRESSION: Persistently dilated loops of small bowel within the abdomen, ileus versus obstruction. Electronically Signed   By: Duanne Guess D.O.   On: 07/15/2023 13:17   DG CHEST PORT 1 VIEW  Result Date: 07/15/2023 CLINICAL DATA:  Fever EXAM: PORTABLE CHEST 1 VIEW COMPARISON:  07/09/2023 FINDINGS: New enteric tube which extends into the stomach. The heart size and mediastinal  contours are within normal limits. Both lungs are clear. The visualized skeletal structures are unremarkable. Gaseous distension of bowel within the imaged upper abdomen. IMPRESSION: 1. No active cardiopulmonary disease. 2. New enteric tube which extends into the stomach. Electronically Signed   By: Duanne Guess D.O.   On: 07/15/2023 13:15   [place] EKG SITE RITE  Result Date: 07/14/2023 If Site Rite image not attached, placement could not be confirmed due to current cardiac rhythm.  DG Abd Portable 1V  Result Date: 07/14/2023 CLINICAL DATA:  NG tube placement EXAM: PORTABLE ABDOMEN - 1 VIEW COMPARISON:  Previous studies including the examination none earlier today FINDINGS: There is interval placement of NG tube with its tip and side port within the stomach. Dilation of small-bowel loops in central abdomen has not changed. Gas is present in ascending and transverse colon. IMPRESSION: Tip of enteric tube is seen in the stomach. No change is seen in small bowel dilation. Electronically Signed   By: Ernie Avena M.D.   On: 07/14/2023 13:11   DG Abd 1 View  Result Date: 07/14/2023 CLINICAL DATA:  Abdominal distention EXAM: ABDOMEN - 1 VIEW COMPARISON:  Previous studies including the examination of  07/13/2023 FINDINGS: There is dilation of small-bowel loops in upper and mid abdomen. Gas is present in transverse colon. There is no demonstrable gas in rectosigmoid. No significant changes are noted in small bowel dilation. IMPRESSION: There is dilation of small bowel loops suggesting possible ileus or partial obstruction. No significant changes are noted. Electronically Signed   By: Ernie Avena M.D.   On: 07/14/2023 13:09   DG Abd 1 View  Result Date: 07/13/2023 CLINICAL DATA:  Follow-up ileus, initial encounter EXAM: ABDOMEN - 1 VIEW COMPARISON:  None Available. FINDINGS: Scattered large and small bowel gas is noted. Multiple dilated loops of small bowel are again noted similar to that seen  on the prior exam. No free air is seen. No mass lesion is noted. No bony abnormality is seen. IMPRESSION: Stable small bowel dilatation centrally. Electronically Signed   By: Alcide Clever M.D.   On: 07/13/2023 20:09   DG Abd 1 View  Result Date: 07/13/2023 CLINICAL DATA:  Abdominal distension. EXAM: ABDOMEN - 1 VIEW COMPARISON:  07/12/2023 FINDINGS: Persistent gaseous distension of bowel loops throughout the abdomen and pelvis. The degree of bowel distension has minimally changed. Upper abdomen is incompletely imaged and not clear if nasogastric tube is still present. Cannot evaluate for free air on the supine image. IMPRESSION: Persistent gaseous distension of small and large bowel loops. Based on the previous CT findings, a distal colonic obstruction cannot be excluded. Bowel gas distension has minimally changed. Electronically Signed   By: Richarda Overlie M.D.   On: 07/13/2023 16:33   DG Abd 1 View  Result Date: 07/12/2023 CLINICAL DATA:  Nasogastric tube placement. EXAM: ABDOMEN - 1 VIEW COMPARISON:  Radiograph and CT earlier today FINDINGS: Tip and side port of the enteric tube below the diaphragm in the stomach. Gaseous bowel distension persists throughout the abdomen. IMPRESSION: Tip and side port of the enteric tube below the diaphragm in the stomach. Electronically Signed   By: Narda Rutherford M.D.   On: 07/12/2023 17:06   CT ABDOMEN PELVIS WO CONTRAST  Result Date: 07/12/2023 CLINICAL DATA:  Abdominal pain, acute. EXAM: CT ABDOMEN AND PELVIS WITHOUT CONTRAST TECHNIQUE: Multidetector CT imaging of the abdomen and pelvis was performed following the standard protocol without IV contrast. RADIATION DOSE REDUCTION: This exam was performed according to the departmental dose-optimization program which includes automated exposure control, adjustment of the mA and/or kV according to patient size and/or use of iterative reconstruction technique. COMPARISON:  Abdominal radiographs 07/12/2023. Abdominal  ultrasound 07/06/2023. FINDINGS: Lower chest: Trace bilateral pleural effusions with associated dependent atelectasis at both lung bases. No consolidation or significant pericardial fluid. There is moderate distal esophageal wall thickening which is nonspecific. Hepatobiliary: No suspicious hepatic findings are identified on noncontrast imaging. In the dome of the left hepatic lobe, there is a 9 mm low-density lesion which is likely a cyst. No evidence of gallstones, gallbladder wall thickening or biliary dilatation. Pancreas: Unremarkable. No pancreatic ductal dilatation or surrounding inflammatory changes. Spleen: Normal in size without focal abnormality. Adrenals/Urinary Tract: Both adrenal glands appear normal. No evidence of urinary tract calculus, suspicious renal lesion or hydronephrosis. Mild nonspecific perinephric soft tissue stranding bilaterally. The bladder appears unremarkable for its degree of distention. Stomach/Bowel: There may be a minimal amount of enteric contrast dependently in the proximal stomach. The stomach and proximal small bowel are not significantly distended. However, as seen on earlier radiographs, there are multiple dilated and fluid-filled loops of mid to distal small bowel and colon. The colon is  dilated into the mid descending colon. The sigmoid colon and rectum are relatively decompressed, although did no focal transition point or obvious colonic mass is identified on this noncontrast study. There is no bowel wall thickening or pneumatosis. There is some high density material within the lumen of the appendix, and the appendix appears mildly distended, although demonstrates no wall thickening or focal surrounding inflammation. A rectal tube is in place. Vascular/Lymphatic: There are no enlarged abdominal or pelvic lymph nodes. Aortic and branch vessel atherosclerosis without evidence of aneurysm. Vascular patency not addressed without contrast. Reproductive: The uterus and ovaries  appear normal. No adnexal mass. Other: Moderate amount of low-density ascites. There is soft tissue stranding throughout the mesenteric fat. Possible peritoneal nodularity in the right mid abdomen measuring up to 2.3 x 1.2 cm on image 44/2. No pneumoperitoneum or abdominal wall hernia identified. Musculoskeletal: No acute or significant osseous findings. Unless specific follow-up recommendations are mentioned in the findings or impression sections, no imaging follow-up of any mentioned incidental findings is recommended. IMPRESSION: 1. Multiple dilated and fluid-filled loops of mid to distal small bowel and colon with relative decompression of the sigmoid colon and rectum. No focal transition point or obvious colonic mass is identified on this study which is limited by the lack of intravenous and enteric contrast. Findings could be secondary to an ileus from underlying peritonitis, although are concerning for possible distal colonic obstruction. Recommend further evaluation with colonoscopy. 2. Moderate amount of low-density ascites with soft tissue stranding throughout the mesenteric fat and possible peritoneal nodularity in the right mid abdomen. No evidence of bowel perforation. Cannot exclude peritoneal carcinomatosis. 3. Mildly prominent appendix without evidence of acute inflammation. 4. Trace bilateral pleural effusions with associated dependent atelectasis at both lung bases. 5. Distal esophageal wall thickening, nonspecific, but potentially related to esophagitis. 6.  Aortic Atherosclerosis (ICD10-I70.0). Electronically Signed   By: Carey Bullocks M.D.   On: 07/12/2023 13:24   DG Abd 1 View  Result Date: 07/12/2023 CLINICAL DATA:  Abdominal pain EXAM: ABDOMEN - 1 VIEW COMPARISON:  None Available. FINDINGS: Significantly dilated bowel loops. No definite free air on this limited supine radiograph. No abnormal calcifications identified although bowel loops limited assessment. Lower lumbar degenerative  change. IMPRESSION: Significantly dilated bowel loops which could represent obstruction or ileus. Consider CT of the abdomen/pelvis for further evaluation. Electronically Signed   By: Feliberto Harts M.D.   On: 07/12/2023 08:31   DG CHEST PORT 1 VIEW  Result Date: 07/09/2023 CLINICAL DATA:  Cough, alcohol withdrawal. EXAM: PORTABLE CHEST 1 VIEW COMPARISON:  Chest radiograph 04/08/2023 FINDINGS: The cardiomediastinal silhouette is normal There is no focal consolidation or pulmonary edema. There is no pleural effusion or pneumothorax There is no acute osseous abnormality. IMPRESSION: No radiographic evidence of acute cardiopulmonary process. Electronically Signed   By: Lesia Hausen M.D.   On: 07/09/2023 12:12   US Abdomen Limited RUQ (LIVER/GB)  Result Date: 07/06/2023 CLINICAL DATA:  Transaminitis EXAM: ULTRASOUND ABDOMEN LIMITED RIGHT UPPER QUADRANT COMPARISON:  None Available. FINDINGS: Gallbladder: No gallstones or wall thickening visualized. No sonographic Murphy sign noted by sonographer. Common bile duct: Diameter: 0.3 cm Liver: No focal lesion identified. Increased parenchymal echogenicity. Portal vein is patent on color Doppler imaging with normal direction of blood flow towards the liver. Other: None. IMPRESSION: 1. Hepatic steatosis. 2. Otherwise unremarkable right upper quadrant ultrasound. Electronically Signed   By: Jearld Lesch M.D.   On: 07/06/2023 14:47    Vassie Loll, MD  [facility]  If 7PM-7AM, please contact night-coverage [email or web address] Password [facility] 07/22/2023, 5:37 PM   LOS: 17 days

## 2023-07-22 NOTE — Progress Notes (Signed)
Physical Therapy Treatment Patient Details Name: Heidi [name] MRN: [id number] DOB: April 02, 1971 Today's Date: 07/22/2023   History of Present Illness Heidi [name] is a 52 y.o. female with medical history significant of hypertension, alcohol abuse, tobacco use disorder who presents to the emergency department from home via EMS due to complaints of generalized bodyaches, nausea, vomiting which she thinks was related to alcohol withdrawal.  Patient usually drinks 4 40 ounce beers daily, but she only had 2 12 ounce beers today.  Patient complained of pain in chest, legs, arms and endorsed having had some falls without any injury.  She endorsed a left forearm rash which occurred after being bitten by a yellowjacket on Saturday (2 days ago).    PT Comments  Pt in good spirits and willing to participate with therapy today.  Pt A&O x 3.  Pt independent bed mobility and transfer safely just increased time to complete.  Pt stable upon standing with use of RW and able to ambulate with no LOB, does demonstrate slow labored movements with reports of fatigue following gait training.  EOS pt left sitting on EOB per request with call bell within reach and RN aware of status.      If plan is discharge home, recommend the following:     Can travel by private vehicle        Equipment Recommendations       Recommendations for Other Services       Precautions / Restrictions Precautions Precautions: Fall Restrictions Weight Bearing Restrictions: No     Mobility  Bed Mobility Overal bed mobility: Independent                  Transfers Overall transfer level: Independent     Sit to Stand: Min guard           General transfer comment: using RW    Ambulation/Gait Ambulation/Gait assistance: Min guard Gait Distance (Feet): 100 Feet Assistive device: Rolling walker (2 wheels) Gait Pattern/deviations: Decreased step length - right, Decreased step length - left, Decreased stride length,  Knees buckling Gait velocity: slow     General Gait Details: stable with RW, slow labored movements, limited by fatigue at EOS   Stairs             Wheelchair Mobility     Tilt Bed    Modified Rankin (Stroke Patients Only)       Balance                                            Cognition Arousal/Alertness: Awake/alert Behavior During Therapy: WFL for tasks assessed/performed Overall Cognitive Status: Within Functional Limits for tasks assessed                                 General Comments: A&O x 3        Exercises      General Comments        Pertinent Vitals/Pain Pain Assessment Pain Assessment: No/denies pain    Home Living                          Prior Function            PT Goals (current goals can now be found in the care  plan section)      Frequency           PT Plan Current plan remains appropriate    Co-evaluation              AM-PAC PT "6 Clicks" Mobility   Outcome Measure  Help needed turning from your back to your side while in a flat bed without using bedrails?: A Little Help needed moving from lying on your back to sitting on the side of a flat bed without using bedrails?: A Little Help needed moving to and from a bed to a chair (including a wheelchair)?: A Little Help needed standing up from a chair using your arms (e.g., wheelchair or bedside chair)?: A Little Help needed to walk in hospital room?: A Little Help needed climbing 3-5 steps with a railing? : A Lot 6 Click Score: 17    End of Session Equipment Utilized During Treatment:  (SBA) Activity Tolerance: Patient tolerated treatment well Patient left: with call bell/phone within reach;in bed Nurse Communication: Mobility status       Time: 1610-9604 PT Time Calculation (min) (ACUTE ONLY): 17 min  Charges:    $Therapeutic Activity: 8-22 mins PT General Charges $$ ACUTE PT VISIT: 1 Visit                      Becky Sax, LPTA/CLT; CBIS [phone]  Juel Burrow 07/22/2023, 4:12 PM

## 2023-07-22 NOTE — Progress Notes (Addendum)
[facility] Progress Note  9 Days Post-Op  Subjective: Patient seen and examined. She is resting comfortably in bed. Denies nausea, vomiting, or abdominal pain. She reports several Bms since yesterday. She notes not want to eat much because of all the liquid stool. She is ready for solid foods.  Objective: Vital signs in last 24 hours: Temp:  [98.4 F (36.9 C)-101.8 F (38.8 C)] 99 F (37.2 C) (08/01 0418) Pulse Rate:  [87-122] 95 (08/01 0500) Resp:  [11-29] 19 (08/01 0500) BP: (91-138)/(60-98) 121/78 (08/01 0500) SpO2:  [90 %-100 %] 97 % (08/01 0500) Weight:  [66.5 kg] 66.5 kg (08/01 0500) Last BM Date : 07/21/23  Intake/Output from previous day: 07/31 0701 - 08/01 0700 In: 662.7 [I.V.:326.4; IV Piggyback:336.3] Out: -  Intake/Output this shift: Total I/O In: 326.3 [I.V.:126.3; IV Piggyback:200] Out: -   General appearance: alert, cooperative, and no distress GI: Abdomen soft, mild distention, no percussion tenderness, nontender to palpation; no rigidity, guarding, rebound tenderness; no tenderness in left upper quadrant  Lab Results:  Recent Labs    07/20/23 0505 07/21/23 0419  WBC 11.6* 11.2*  HGB 8.8* 8.9*  HCT 26.1* 27.6*  PLT 218 239   BMET Recent Labs    07/20/23 0505 07/21/23 0419  NA 132* 132*  K 4.5 4.3  CL 103 101  CO2 25 24  GLUCOSE 180* 147*  BUN 13 11  CREATININE 0.57 0.53  CALCIUM 9.3 9.3   PT/INR No results for input(s): "LABPROT", "INR" in the last 72 hours.  Studies/Results: No results found.  Anti-infectives: Anti-infectives (From admission, onward)    Start     Dose/Rate Route Frequency Ordered Stop   07/19/23 1800  vancomycin (VANCOREADY) IVPB 1500 mg/300 mL  Status:  Discontinued        1,500 mg 150 mL/hr over 120 Minutes Intravenous Every 24 hours 07/18/23 1840 07/21/23 1622   07/19/23 1500  meropenem (MERREM) 1 g in sodium chloride 0.9 % 100 mL IVPB        1 g 200 mL/hr over 30 Minutes Intravenous Every 8  hours 07/19/23 1352     07/18/23 2200  piperacillin-tazobactam (ZOSYN) IVPB 3.375 g  Status:  Discontinued        3.375 g 12.5 mL/hr over 240 Minutes Intravenous Every 8 hours 07/18/23 1809 07/19/23 1352   07/18/23 1845  vancomycin (VANCOREADY) IVPB 1500 mg/300 mL        1,500 mg 150 mL/hr over 120 Minutes Intravenous STAT 07/18/23 1840 07/19/23 1427   07/16/23 1100  Ampicillin-Sulbactam (UNASYN) 3 g in sodium chloride 0.9 % 100 mL IVPB  Status:  Discontinued        3 g 200 mL/hr over 30 Minutes Intravenous Every 6 hours 07/16/23 0649 07/18/23 1800       Assessment/Plan:  Patient is a 52 year old female who was admitted with alcohol withdrawal. She subsequently developed an adynamic ileus with small and large bowel dilation. She underwent CT of the chest, abdomen, and pelvis 7/28 which demonstrated concern for possible splenic laceration versus abscess versus infarct. Vital signs stable with intermittent tachycardia. Did have an episode of fever which resolved with Tylenol. It is possible that her flexible sigmoidoscopy resulted in splenic laceration which is resolving on its own. Clinically, her Ileus is continuing to improve and she is having several Bms.  -Reimage in the next 24-48 hr to reassess stability of splenic laceration. If there is worsening, recommend reconsulting IR and consider transferring to Main hospital for potential  surgical intervention -Continue broad spectrum antibiotics -Advance to soft diet given continued bowel function, patient advised to take it slow and to back down if N/V reoccurs  -If patient's condition changes to the point of needing surgical intervention, she will be unable to undergo surgery at this facility and will require transfer 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 This was discussed with anesthesiology.  -Decrease bowel regimen, continue Maalox -Electrolyte replacement -Remainder of care per primary team   LOS: 17 days    Dominica Severin 07/22/2023

## 2023-07-22 NOTE — TOC Progression Note (Signed)
Transition of Care [facility]) - Progression Note    Patient Details  Name: Heidi [name] MRN: [id number] Date of Birth: [birth year]  Transition of [facility]) CM/SW Contact  Leitha Bleak, RN Phone Number: 07/22/2023, 12:43 PM  Clinical Narrative:   Patient is improving, day 17 of hospital admission. Patient now alert and oriented. She does not want to go to SNF. She will discharge home. She has equipment needed. We dicussed drinking and substance abuse resources. She does not plan to drink. TOC added Day Loraine Leriche for her to make an appointment for counseling. She has been there in the past.     Expected Discharge Plan: Home/Self Care Barriers to Discharge: Continued Medical Work up  Expected Discharge Plan and Services                                               Social Determinants of Health (SDOH) Interventions SDOH Screenings   Food Insecurity: No Food Insecurity (07/05/2023)  Housing: Low Risk  (07/05/2023)  Transportation Needs: No Transportation Needs (07/05/2023)  Utilities: Not At Risk (07/05/2023)  Alcohol Screen: High Risk (05/06/2023)  Depression (PHQ2-9): Medium Risk (05/18/2023)  Tobacco Use: High Risk (07/13/2023)    Readmission Risk Interventions    07/22/2023   12:43 PM  Readmission Risk Prevention Plan  Transportation Screening Complete  PCP or Specialist Appt within 3-5 Days Not Complete  HRI or Home Care Consult Complete  Social Work Consult for Recovery Care Planning/Counseling Complete  Palliative Care Screening Not Applicable  Medication Review Oceanographer) Complete

## 2023-07-22 NOTE — Progress Notes (Signed)
Patient lost IV access, patient is refusing for staff to attempt another IV. Dr. Herma Carson notified, no new orders.

## 2023-07-23 DIAGNOSIS — R112 Nausea with vomiting, unspecified: Secondary | ICD-10-CM | POA: Diagnosis not present

## 2023-07-23 DIAGNOSIS — E876 Hypokalemia: Secondary | ICD-10-CM | POA: Diagnosis not present

## 2023-07-23 DIAGNOSIS — K567 Ileus, unspecified: Secondary | ICD-10-CM | POA: Diagnosis not present

## 2023-07-23 DIAGNOSIS — R7401 Elevation of levels of liver transaminase levels: Secondary | ICD-10-CM | POA: Diagnosis not present

## 2023-07-23 LAB — GLUCOSE, CAPILLARY
Glucose-Capillary: 101 mg/dL — ABNORMAL HIGH (ref 70–99)
Glucose-Capillary: 110 mg/dL — ABNORMAL HIGH (ref 70–99)
Glucose-Capillary: 137 mg/dL — ABNORMAL HIGH (ref 70–99)

## 2023-07-23 MED ORDER — METOPROLOL TARTRATE 25 MG PO TABS
12.5000 mg | ORAL_TABLET | Freq: Two times a day (BID) | ORAL | Status: DC
  Administered 2023-07-23 – 2023-07-24 (×2): 12.5 mg via ORAL
  Filled 2023-07-23 (×2): qty 1

## 2023-07-23 MED ORDER — THIAMINE MONONITRATE 100 MG PO TABS
100.0000 mg | ORAL_TABLET | Freq: Every day | ORAL | Status: DC
  Administered 2023-07-23 – 2023-07-24 (×2): 100 mg via ORAL
  Filled 2023-07-23 (×2): qty 1

## 2023-07-23 NOTE — Progress Notes (Addendum)
[facility] Progress Note  10 Days Post-Op  Subjective: Patient seen and examined. She is resting comfortably in bed. She was transferred to the floor and is pending discharge today. Denies nausea, vomiting, or abdominal pain. Continuing to have Bms and notes that her most recent one was formed. She is still not eating much. Patient lost IV access.  Objective: Vital signs in last 24 hours: Temp:  [98.3 F (36.8 C)-98.8 F (37.1 C)] 98.3 F (36.8 C) (08/02 0347) Pulse Rate:  [89-106] 89 (08/02 0347) Resp:  [17-20] 20 (08/02 0347) BP: (97-148)/(54-90) 128/83 (08/02 0347) SpO2:  [98 %-100 %] 99 % (08/02 0347) Weight:  [62.6 kg] 62.6 kg (08/02 0347) Last BM Date : 07/22/23  Intake/Output from previous day: 08/01 0701 - 08/02 0700 In: 110.9 [I.V.:20.9; IV Piggyback:90] Out: 400 [Urine:400] Intake/Output this shift: No intake/output data recorded.  General appearance: alert, cooperative, and no distress GI: Abdomen soft, distention significantly improved; no percussion tenderness, nontender to palpation; no rigidity, guarding, rebound tenderness; no tenderness in left upper quadrant  Lab Results:  Recent Labs    07/21/23 0419  WBC 11.2*  HGB 8.9*  HCT 27.6*  PLT 239   BMET Recent Labs    07/21/23 0419  NA 132*  K 4.3  CL 101  CO2 24  GLUCOSE 147*  BUN 11  CREATININE 0.53  CALCIUM 9.3   PT/INR No results for input(s): "LABPROT", "INR" in the last 72 hours.  Studies/Results: CT ABDOMEN PELVIS W CONTRAST  Result Date: 07/22/2023 CLINICAL DATA:  Abdominal pain. Ileus. Follow-up splenic laceration. EXAM: CT ABDOMEN AND PELVIS WITH CONTRAST TECHNIQUE: Multidetector CT imaging of the abdomen and pelvis was performed using the standard protocol following bolus administration of intravenous contrast. RADIATION DOSE REDUCTION: This exam was performed according to the departmental dose-optimization program which includes automated exposure control, adjustment  of the mA and/or kV according to patient size and/or use of iterative reconstruction technique. CONTRAST:  OMNIPAQUE IOHEXOL 300 MG/ML  SOLN COMPARISON:  07/18/2023 FINDINGS: Lower Chest: No acute findings. Previously seen small pleural effusions have resolved. Hepatobiliary: No suspicious hepatic masses identified. Gallbladder is unremarkable. No evidence of biliary ductal dilatation. Pancreas:  No mass or inflammatory changes. Spleen: Stable appearance of complex low-attenuation lesion involving the majority of the spleen. This has somewhat linear or geographic margins in some locations, suggesting splenic laceration/hematoma or infarct, with infection or neoplasm considered less likely. No evidence of perisplenic hematoma or hemoperitoneum. Adrenals/Urinary Tract: No suspicious masses identified. No evidence of ureteral calculi or hydronephrosis. Stomach/Bowel: Generalized mild dilatation of small bowel and nondependent colon is seen, mildly increased in suspicious for adynamic ileus. No evidence of obstruction, focal inflammatory process or abnormal fluid collections. No No evidence of free intraperitoneal air. Vascular/Lymphatic: No pathologically enlarged lymph nodes. No acute vascular findings. Reproductive: Tiny less than 1 cm posterior uterine fibroid noted. Otherwise unremarkable. Other:  None. Musculoskeletal:  No suspicious bone lesions identified. IMPRESSION: Stable appearance of complex low-attenuation lesion involving the majority of the spleen. Most likely differential diagnosis is splenic laceration or infarct, with infection or neoplasm considered less likely. No evidence of perisplenic hematoma or hemoperitoneum. Increased mild dilatation of small bowel and nondependent colon, suspicious for adynamic ileus. Tiny less than 1 cm posterior uterine fibroid. Electronically Signed   By: Danae Orleans M.D.   On: 07/22/2023 13:59    Anti-infectives: Anti-infectives (From admission, onward)     Start     Dose/Rate Route Frequency Ordered Stop  07/19/23 1800  vancomycin (VANCOREADY) IVPB 1500 mg/300 mL  Status:  Discontinued        1,500 mg 150 mL/hr over 120 Minutes Intravenous Every 24 hours 07/18/23 1840 07/21/23 1622   07/19/23 1500  meropenem (MERREM) 1 g in sodium chloride 0.9 % 100 mL IVPB        1 g 200 mL/hr over 30 Minutes Intravenous Every 8 hours 07/19/23 1352     07/18/23 2200  piperacillin-tazobactam (ZOSYN) IVPB 3.375 g  Status:  Discontinued        3.375 g 12.5 mL/hr over 240 Minutes Intravenous Every 8 hours 07/18/23 1809 07/19/23 1352   07/18/23 1845  vancomycin (VANCOREADY) IVPB 1500 mg/300 mL        1,500 mg 150 mL/hr over 120 Minutes Intravenous STAT 07/18/23 1840 07/19/23 1427   07/16/23 1100  Ampicillin-Sulbactam (UNASYN) 3 g in sodium chloride 0.9 % 100 mL IVPB  Status:  Discontinued        3 g 200 mL/hr over 30 Minutes Intravenous Every 6 hours 07/16/23 0649 07/18/23 1800       Assessment/Plan:  Patient is a 52 year old female who was admitted with alcohol withdrawal. She subsequently developed an adynamic ileus with small and large bowel dilation. She underwent CT of the chest, abdomen, and pelvis 7/28 which demonstrated concern for possible splenic laceration versus infarct. It is possible that her flexible sigmoidoscopy resulted in splenic laceration which is resolving on its own. Repeat imaging 8/1 showing stable splenic lesion. Patient has been afebrile and with minimal tachycardia for the past 24 hr. Clinically, her adynamic Ileus is much improved and she is having regular Bms. Discussed preventative measures with patient to prevent constipation.  -CT AP 8/1 showing stable appearance of splenic lesion. Would continue treatment with broad-spectrum antibiotics. Would only consider surgical intervention if patient fails to improve with conservative measures. -Continue soft diet, encourage supplements -If patient's condition changes to the point of  needing surgical intervention, she will be unable to undergo surgery at this facility and will require transfer 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 This was discussed with anesthesiology.  -Bowel regimen includes Senna and Miralax -Electrolyte replacement -Remainder of care per primary team. General surgery will sign off at this time, please call with any questions   LOS: 18 days    Dominica Severin 07/23/2023

## 2023-07-23 NOTE — Progress Notes (Signed)
Occupational Therapy Treatment Patient Details Name: RIYANSHI WAHAB MRN: [id number] DOB: [birth year] Today's Date: 07/23/2023   History of present illness Heidi [name] is a 52 y.o. female with medical history significant of hypertension, alcohol abuse, tobacco use disorder who presents to the emergency department from home via EMS due to complaints of generalized bodyaches, nausea, vomiting which she thinks was related to alcohol withdrawal.  Patient usually drinks 4 40 ounce beers daily, but she only had 2 12 ounce beers today.  Patient complained of pain in chest, legs, arms and endorsed having had some falls without any injury.  She endorsed a left forearm rash which occurred after being bitten by a yellowjacket on Saturday (2 days ago).   OT comments  Pt agreeable to OT treatment session and participates well. She completed bed mobility independently. Pt completed sit to stand t/f with use of RW and supervision for safety. She demonstarted ability to participate in activity while maintaining standing balance with RW. Pt stood at sink and brushed teeth and was face with supervision and RW for balance. She completed ambulated over 100 ft in hallway (down and back on unit) with RW and supervision (no physical assist required). Pt reports that she is feeling much better and required no physical assist for mobility and ADLS. She stated that she feels safe to d/c home and does not require rehab at [facility]. Changed d/c recommendations to home heath therapy based on pt improvements.    Recommendations for follow up therapy are one component of a multi-disciplinary discharge planning process, led by the attending physician.  Recommendations may be updated based on patient status, additional functional criteria and insurance authorization.    Assistance Recommended at Discharge PRN  Patient can return home with the following  Assistance with cooking/housework;Assist for transportation   Equipment  Recommendations  None recommended by OT    Recommendations for Other Services      Precautions / Restrictions Precautions Precautions: Fall Restrictions Weight Bearing Restrictions: No       Mobility Bed Mobility                    Transfers Overall transfer level: Independent Equipment used: Rolling walker (2 wheels) Transfers: Sit to/from Stand, Bed to chair/wheelchair/BSC Sit to Stand: Supervision     Step pivot transfers: Supervision     General transfer comment: using RW     Balance Overall balance assessment: Modified Independent (use of RW for balance) Sitting-balance support: No upper extremity supported, Feet supported Sitting balance-Leahy Scale: Good Sitting balance - Comments: seated at EOB   Standing balance support: Reliant on assistive device for balance, During functional activity, Bilateral upper extremity supported Standing balance-Leahy Scale: Good Standing balance comment: using RW                           ADL either performed or assessed with clinical judgement   ADL       Grooming: Set up;Standing;Supervision/safety                                      Extremity/Trunk Assessment Upper Extremity Assessment Upper Extremity Assessment: Overall WFL for tasks assessed                      Cognition Arousal/Alertness: Awake/alert Behavior During Therapy: WFL for tasks assessed/performed Overall Cognitive Status:  Within Functional Limits for tasks assessed                                                     Pertinent Vitals/ Pain       Pain Assessment Pain Assessment: No/denies pain         Frequency  Min 2X/week        Progress Toward Goals  OT Goals(current goals can now be found in the care plan section)  Progress towards OT goals: Progressing toward goals  ADL Goals Pt Will Perform Grooming: with modified independence;sitting Pt Will Perform Upper Body  Dressing: with modified independence;sitting Pt Will Perform Lower Body Dressing: sitting/lateral leans;with min guard assist Pt Will Perform Toileting - Clothing Manipulation and hygiene: with modified independence;sitting/lateral leans Pt/caregiver will Perform Home Exercise Program: Increased ROM;Increased strength;Both right and left upper extremity;Independently  Plan Discharge plan needs to be updated            OT goals addressed during session: ADL's and self-care      AM-PAC OT "6 Clicks" Daily Activity     Outcome Measure   Help from another person eating meals?: None Help from another person taking care of personal grooming?: None Help from another person toileting, which includes using toliet, bedpan, or urinal?: None Help from another person bathing (including washing, rinsing, drying)?: A Little Help from another person to put on and taking off regular upper body clothing?: None Help from another person to put on and taking off regular lower body clothing?: None 6 Click Score: 23    End of Session Equipment Utilized During Treatment: Rolling walker (2 wheels)  OT Visit Diagnosis: Unsteadiness on feet (R26.81)   Activity Tolerance Patient tolerated treatment well   Patient Left in chair;with call bell/phone within reach           Time: 1610-9604 OT Time Calculation (min): 24 min  Charges: OT General Charges $OT Visit: 1 Visit OT Treatments $Self Care/Home Management : 23-37 mins    Bevelyn Ngo, OTR/L  07/23/2023, 9:35 AM

## 2023-07-23 NOTE — Progress Notes (Signed)
PROGRESS NOTE  NELISSA BOLDUC ZOX:[id number] DOB: 1971/09/29 DOA: 07/05/2023 PCP: Rica Records, FNP  Brief History:   52 y.o. female with medical history significant of hypertension, alcohol abuse, tobacco use disorder who presents to the emergency department from home via EMS due to complaints of generalized bodyaches, nausea, vomiting which she thinks was related to alcohol withdrawal. Patient usually drinks 4 40 ounce beers daily, but she only had 2 12 ounce beers today.  Patient complained of pain in chest, legs, arms and endorsed having had some falls without any injury.  Her hospitalization has been prolonged secondary to delirium and agitation presumably from alcohol withdrawal.  She has been on Precedex since 07/07/2023.  Attempts at weaning had resulted in increased agitation.  Unfortunately, her hospitalization has been prolonged secondary to development of ileus.  NG was inserted to low intermittent suction on 07/14/2023.  PICC line placed on 07/15/23 and TPN was initiated due to prolonged period of no oral intake and prolonged ileus.   On 07/16/23, pt began showing signs of return of bowel function with 3 BMs in last 24 hours.  She developed a fever up to 102.2.  UA was negative.  Blood cultures were drawn and pt started on empiric unasyn  On 07/17/2023 in the late morning, the patient's Precedex was weaned off.  The patient remained fairly lucid and redirectable. She had one BM in last 24 hours.  TPN continues  On 07/10/2023, the patient continued to have fever up to 101.3 F.  She has had 2 bowel movements in last 24 hours.  She had NG output of 1150, but abdomen was less distended.   Assessment/Plan: Acute metabolic encephalopathy/delirium tremens -Patient has been on Precedex and 07/07/2023 -Attempts at weaning has resulted in increased agitation -Unfortunately, the patient's ileus has limited oral medications to help mitigate her agitation -Continue IV Precedex for  now>>weaned off 7/27 AM -Librium was discontinued -received high-dose thiamine  -07/17/23 Repeat UA 0-5WBC -W11--9147 -Ammonia--25 -Folic acid--8.7 -TSH--2.866 -UDS positive for benzodiazepine -07/14/23--add clonidine TTS-1>>TTS-2 -07/17/23--weaned off precedex around noon -07/20/23 mental status much improved, back to baseline -/731/24: Continue supportive care.  Patient planning no further alcohol use in the future. -Patient tolerating diet, following commands appropriately and demonstrating no further signs of withdrawal.   Abdominal ileus -07/12/2023 CT AP--multiple dilated fluid-filled loops of SB and colon -Appreciate GI>>possible neostigmine -07/13/2023 flex sig--no obstruction noted--erythematous mucosa in the transverse colon with dilated in the descending, transverse: -07/14/2023--NG inserted to low intermittent suction -Appreciate general surgery and GI assistance -optimize electrolytes -Overall appears to be improving>> remove NG 07/19/2023 -07/18/2023 CT abdomen shows improving ileus -Check C. Difficile--no BM to collect -07/21/23: Diet advanced to full liquid with intention to further increase consistency if tolerated. -TPN has been discontinued after losing PICC line.   Splenic phlegmon/laceration? -Case discussed with general surgery, Dr. Robyne Peers -Case discussed with IR, Dr. Deanne Coffer -Primary considerations at this time--hemorrhage versus infection -Continue IV meropenem. -Continue to further advance diet and follow recommendations by general surgery. -Repeat CT scan demonstrating stable findings with suggestion most likely to laceration.  No abscess or drainable fluid  -per general surgery okay to DC antibiotics.   Fever -developed fever 102.2 in am 7/26 -fever 101.2 am 7/28 -blood cultures--neg to date -UA neg for pyuria -7/25 CXR personally reviewed--increased interstitial markings -KUB--personally reviewed--persistent bowel dilatation, unchanged -PCT  0.11 -Lactic acid within normal limits. -CT chest/ AP--Heterogeneous and poorly defined low-attenuation lesion  involving the majority of the spleen. Differential diagnosis includes splenic laceration/hematoma, infarct, phlegmon/developing abscess, or less likely neoplasm. -Patient has remained afebrile for over 24 hours; WBCs down to 11 range and per findings on CT after discussing with general surgery okay to discontinue antibiotics and observe clinical response.   Atrial tachycardia -Continue diltiazem and metoprolol -Follow-up vital signs -Transition meds to oral route when fully tolerating p.o.'s.   FEN -Continue to replete electrolytes as needed -Feeding supplement and diet advancement to be provided -TPN discontinue after losing PICC line and making improvement in oral intake. -So far tolerating soft diet.   Essential hypertension -Irbesartan and diltiazem were initially discontinued secondary to low blood pressure. -Patient blood pressure improved while receiving Cardizem and clonidine -Will also continue IV metoprolol -Continue to follow vital signs -Look to transition medications to oral route when fully tolerating p.o.'s.  Thrombocytopenia -Secondary to myelosuppression from alcohol use -No signs of overt bleeding appreciated -Continue to follow platelet count.   Transaminasemia -In the setting of alcohol abuse -Cessation counseling provided; patient looking to stop drinking -LFTs continue trending down -Continue monitoring.   Tobacco cessation -Cessation counseling provided -Continue nicotine patch.   Hypokalemia -Magnesium within normal limits -Continue to follow electrolytes trend and further replete as needed     Family Communication: No family at bedside.   Consultants:  PCCM   Code Status:  FULL    DVT Prophylaxis:  SCDs     Procedures: As Listed in Progress Note Above   Antibiotics: Unasyn, vancomycin, meropenem and Zosyn: At this moment I  will discontinue and patient will be observed off antibiotics.  Subjective: Afebrile, so far demonstrating good tolerance to soft diet.  No nausea, no vomiting, no withdrawal symptoms.   Objective: Vitals:   07/22/23 2145 07/23/23 0003 07/23/23 0347 07/23/23 1437  BP: (!) 139/90 126/80 128/83 (!) 126/94  Pulse: (!) 105 99 89 90  Resp: 20 18 20    Temp: 98.7 F (37.1 C) 98.6 F (37 C) 98.3 F (36.8 C) 97.6 F (36.4 C)  TempSrc: Oral   Axillary  SpO2: 100% 98% 99% 100%  Weight:   62.6 kg   Height:        Intake/Output Summary (Last 24 hours) at 07/23/2023 1830 Last data filed at 07/23/2023 1300 Gross per 24 hour  Intake 240 ml  Output --  Net 240 ml   Weight change: -3.9 kg  Exam: General exam: Alert, awake, oriented x 3; no fever, no chest pain, no nausea, no vomiting.  Expressed so far good tolerance to soft diet. Respiratory system: Clear to auscultation. Respiratory effort normal.  Good saturation on room air. Cardiovascular system: No rubs or gallops.ate controlled. Gastrointestinal system: Abdomen is soft, nontender and with positive bowel sounds. Central nervous system: Alert and oriented. No focal neurological deficits. Extremities: No cyanosis or clubbing. Skin: No petechiae. Psychiatry: Judgement and insight appear normal. Mood & affect appropriate.   Data Reviewed: I have personally reviewed following labs and imaging studies  Basic Metabolic Panel: Recent Labs  Lab 07/17/23 0325 07/18/23 0430 07/19/23 0402 07/20/23 0505 07/21/23 0419  NA 136 136 130* 132* 132*  K 3.0* 3.6 4.1 4.5 4.3  CL 108 108 103 103 101  CO2 22 25 21* 25 24  GLUCOSE 134* 155* 174* 180* 147*  BUN 7 7 8 13 11   CREATININE 0.45 0.49 0.45 0.57 0.53  CALCIUM 9.0 9.0 9.7 9.3 9.3  MG 1.9 1.9 1.9  --  2.0  PHOS 3.6 4.5  --   --   --  Liver Function Tests: Recent Labs  Lab 07/18/23 0430 07/19/23 0402 07/20/23 0505 07/21/23 0419  AST 27 30 40 73*  ALT 17 25 37 73*  ALKPHOS 76  88 78 81  BILITOT 0.6 0.7 0.2* 0.3  PROT 6.2* 7.0 6.4* 7.0  ALBUMIN 2.6* 3.0* 2.7* 3.0*   CBC: Recent Labs  Lab 07/17/23 1135 07/18/23 0430 07/19/23 0402 07/20/23 0505 07/21/23 0419  WBC 17.4* 17.2* 19.3* 11.6* 11.2*  NEUTROABS  --   --   --  8.2*  --   HGB 9.1* 8.5* 9.5* 8.8* 8.9*  HCT 26.2* 25.4* 28.6* 26.1* 27.6*  MCV 88.5 89.8 90.2 90.9 92.6  PLT 157 173 218 218 239   CBG: Recent Labs  Lab 07/22/23 1121 07/22/23 1713 07/22/23 2334 07/23/23 0534 07/23/23 1721  GLUCAP 114* 94 90 110* 101*   Urine analysis:    Component Value Date/Time   COLORURINE YELLOW 07/17/2023 1745   APPEARANCEUR CLEAR 07/17/2023 1745   LABSPEC 1.010 07/17/2023 1745   PHURINE 7.0 07/17/2023 1745   GLUCOSEU NEGATIVE 07/17/2023 1745   HGBUR NEGATIVE 07/17/2023 1745   BILIRUBINUR NEGATIVE 07/17/2023 1745   KETONESUR NEGATIVE 07/17/2023 1745   PROTEINUR NEGATIVE 07/17/2023 1745   UROBILINOGEN 0.2 01/04/2012 1330   NITRITE NEGATIVE 07/17/2023 1745   LEUKOCYTESUR NEGATIVE 07/17/2023 1745   Sepsis Labs:  Recent Results (from the past 240 hour(s))  Culture, blood (Routine X 2) w Reflex to ID Panel     Status: None   Collection Time: 07/15/23 10:08 AM   Specimen: BLOOD RIGHT HAND  Result Value Ref Range Status   Specimen Description BLOOD RIGHT HAND  Final   Special Requests   Final    BOTTLES DRAWN AEROBIC AND ANAEROBIC Blood Culture adequate volume   Culture   Final    NO GROWTH 5 DAYS Performed at [facility], [address] [place], [place] [address]    Report Status 07/20/2023 FINAL  Final  Culture, blood (Routine X 2) w Reflex to ID Panel     Status: None   Collection Time: 07/15/23 10:08 AM   Specimen: BLOOD  Result Value Ref Range Status   Specimen Description BLOOD BLOOD LEFT HAND  Final   Special Requests   Final    BOTTLES DRAWN AEROBIC AND ANAEROBIC Blood Culture adequate volume   Culture   Final    NO GROWTH 5 DAYS Performed at [facility], [address]  [place], [place] [address]    Report Status 07/20/2023 FINAL  Final  Culture, blood (Routine X 2) w Reflex to ID Panel     Status: None   Collection Time: 07/18/23  6:38 PM   Specimen: BLOOD LEFT FOREARM  Result Value Ref Range Status   Specimen Description BLOOD LEFT FOREARM  Final   Special Requests   Final    BOTTLES DRAWN AEROBIC AND ANAEROBIC Blood Culture results may not be optimal due to an excessive volume of blood received in culture bottles   Culture   Final    NO GROWTH 5 DAYS Performed at [facility], [address] [place], [place] [address]    Report Status 07/23/2023 FINAL  Final  Culture, blood (Routine X 2) w Reflex to ID Panel     Status: None   Collection Time: 07/18/23  6:47 PM   Specimen: BLOOD LEFT HAND  Result Value Ref Range Status   Specimen Description BLOOD LEFT HAND  Final   Special Requests   Final    BOTTLES  DRAWN AEROBIC AND ANAEROBIC BLOOD LEFT HAND   Culture   Final    NO GROWTH 5 DAYS Performed at [facility], [address] [place], [place] [address]    Report Status 07/23/2023 FINAL  Final     Scheduled Meds:  Chlorhexidine Gluconate Cloth  6 each Topical Daily   cloNIDine  0.2 mg Transdermal Weekly   diltiazem  45 mg Oral Q8H   feeding supplement  237 mL Oral TID BM   folic acid  1 mg Oral Daily   insulin aspart  0-15 Units Subcutaneous Q6H   metoprolol tartrate  12.5 mg Oral BID   multivitamin with minerals  1 tablet Oral Daily   nicotine  14 mg Transdermal Daily   polyethylene glycol  17 g Oral Daily   senna-docusate  1 tablet Oral BID   sodium chloride flush  10-40 mL Intracatheter Q12H   thiamine  100 mg Oral Daily   Procedures/Studies: CT ABDOMEN PELVIS W CONTRAST  Result Date: 07/22/2023 CLINICAL DATA:  Abdominal pain. Ileus. Follow-up splenic laceration. EXAM: CT ABDOMEN AND PELVIS WITH CONTRAST TECHNIQUE: Multidetector CT imaging of the abdomen and pelvis was performed using the standard protocol following bolus administration of  intravenous contrast. RADIATION DOSE REDUCTION: This exam was performed according to the departmental dose-optimization program which includes automated exposure control, adjustment of the mA and/or kV according to patient size and/or use of iterative reconstruction technique. CONTRAST:  OMNIPAQUE IOHEXOL 300 MG/ML  SOLN COMPARISON:  07/18/2023 FINDINGS: Lower Chest: No acute findings. Previously seen small pleural effusions have resolved. Hepatobiliary: No suspicious hepatic masses identified. Gallbladder is unremarkable. No evidence of biliary ductal dilatation. Pancreas:  No mass or inflammatory changes. Spleen: Stable appearance of complex low-attenuation lesion involving the majority of the spleen. This has somewhat linear or geographic margins in some locations, suggesting splenic laceration/hematoma or infarct, with infection or neoplasm considered less likely. No evidence of perisplenic hematoma or hemoperitoneum. Adrenals/Urinary Tract: No suspicious masses identified. No evidence of ureteral calculi or hydronephrosis. Stomach/Bowel: Generalized mild dilatation of small bowel and nondependent colon is seen, mildly increased in suspicious for adynamic ileus. No evidence of obstruction, focal inflammatory process or abnormal fluid collections. No No evidence of free intraperitoneal air. Vascular/Lymphatic: No pathologically enlarged lymph nodes. No acute vascular findings. Reproductive: Tiny less than 1 cm posterior uterine fibroid noted. Otherwise unremarkable. Other:  None. Musculoskeletal:  No suspicious bone lesions identified. IMPRESSION: Stable appearance of complex low-attenuation lesion involving the majority of the spleen. Most likely differential diagnosis is splenic laceration or infarct, with infection or neoplasm considered less likely. No evidence of perisplenic hematoma or hemoperitoneum. Increased mild dilatation of small bowel and nondependent colon, suspicious for adynamic ileus. Tiny  less than 1 cm posterior uterine fibroid. Electronically Signed   By: Danae Orleans M.D.   On: 07/22/2023 13:59   CT ABDOMEN PELVIS W CONTRAST  Result Date: 07/18/2023 CLINICAL DATA:  Fever. Respiratory illness. Abdominal pain. Ileus. Colitis. EXAM: CT CHEST, ABDOMEN, AND PELVIS WITH CONTRAST TECHNIQUE: Multidetector CT imaging of the chest, abdomen and pelvis was performed following the standard protocol during bolus administration of intravenous contrast. RADIATION DOSE REDUCTION: This exam was performed according to the departmental dose-optimization program which includes automated exposure control, adjustment of the mA and/or kV according to patient size and/or use of iterative reconstruction technique. CONTRAST:  OMNIPAQUE IOHEXOL 300 MG/ML  SOLN COMPARISON:  Noncontrast AP CT on 07/12/2023 FINDINGS: CT CHEST FINDINGS Cardiovascular: No acute findings. Mediastinum/Lymph Nodes: No  masses or pathologically enlarged lymph nodes identified. Lungs/Pleura: No suspicious pulmonary nodules or masses identified. No No evidence of pulmonary consolidation or central endobronchial obstruction. Small left pleural effusion noted as well as mild dependent atelectasis. Musculoskeletal:  No suspicious bone lesions identified. CT ABDOMEN AND PELVIS FINDINGS Hepatobiliary: No masses identified. Gallbladder is unremarkable. No evidence of biliary ductal dilatation. Pancreas:  No mass or inflammatory changes. Spleen: Within normal limits in size. Heterogeneous and poorly defined low-attenuation lesion is seen involving the majority of the spleen, without perisplenic fluid. Differential diagnosis includes splenic laceration, infarct, abscess, or less likely neoplasm. Adrenals/Urinary tract: No suspicious masses or hydronephrosis. Stomach/Bowel: Nasogastric tube tip in body of stomach. Mild diffuse dilatation of small bowel and nondependent colon, most likely due to ileus. No evidence of obstruction, inflammatory process,  or abnormal fluid collections. Normal appendix visualized. Vascular/Lymphatic: No pathologically enlarged lymph nodes identified. No acute vascular findings. Reproductive: No mass or inflammatory changes identified. Small amount of low-attenuation free fluid seen in pelvic cul-de-sac. Other:  None. Musculoskeletal:  No suspicious bone lesions identified. IMPRESSION: Heterogeneous and poorly defined low-attenuation lesion involving the majority of the spleen. Differential diagnosis includes splenic laceration/hematoma, infarct, phlegmon/developing abscess, or less likely neoplasm. Mild diffuse dilatation of small bowel and nondependent colon, most likely due to ileus. Small left pleural effusion and free pelvic fluid. Critical Value/emergent results were called by telephone at the time of interpretation on 07/18/2023 at 4:46 pm to provider DAVID TAT , who verbally acknowledged these results. Electronically Signed   By: Danae Orleans M.D.   On: 07/18/2023 17:04   CT CHEST W CONTRAST  Result Date: 07/18/2023 CLINICAL DATA:  Fever. Respiratory illness. Abdominal pain. Ileus. Colitis. EXAM: CT CHEST, ABDOMEN, AND PELVIS WITH CONTRAST TECHNIQUE: Multidetector CT imaging of the chest, abdomen and pelvis was performed following the standard protocol during bolus administration of intravenous contrast. RADIATION DOSE REDUCTION: This exam was performed according to the departmental dose-optimization program which includes automated exposure control, adjustment of the mA and/or kV according to patient size and/or use of iterative reconstruction technique. CONTRAST:  OMNIPAQUE IOHEXOL 300 MG/ML  SOLN COMPARISON:  Noncontrast AP CT on 07/12/2023 FINDINGS: CT CHEST FINDINGS Cardiovascular: No acute findings. Mediastinum/Lymph Nodes: No masses or pathologically enlarged lymph nodes identified. Lungs/Pleura: No suspicious pulmonary nodules or masses identified. No No evidence of pulmonary consolidation or central  endobronchial obstruction. Small left pleural effusion noted as well as mild dependent atelectasis. Musculoskeletal:  No suspicious bone lesions identified. CT ABDOMEN AND PELVIS FINDINGS Hepatobiliary: No masses identified. Gallbladder is unremarkable. No evidence of biliary ductal dilatation. Pancreas:  No mass or inflammatory changes. Spleen: Within normal limits in size. Heterogeneous and poorly defined low-attenuation lesion is seen involving the majority of the spleen, without perisplenic fluid. Differential diagnosis includes splenic laceration, infarct, abscess, or less likely neoplasm. Adrenals/Urinary tract: No suspicious masses or hydronephrosis. Stomach/Bowel: Nasogastric tube tip in body of stomach. Mild diffuse dilatation of small bowel and nondependent colon, most likely due to ileus. No evidence of obstruction, inflammatory process, or abnormal fluid collections. Normal appendix visualized. Vascular/Lymphatic: No pathologically enlarged lymph nodes identified. No acute vascular findings. Reproductive: No mass or inflammatory changes identified. Small amount of low-attenuation free fluid seen in pelvic cul-de-sac. Other:  None. Musculoskeletal:  No suspicious bone lesions identified. IMPRESSION: Heterogeneous and poorly defined low-attenuation lesion involving the majority of the spleen. Differential diagnosis includes splenic laceration/hematoma, infarct, phlegmon/developing abscess, or less likely neoplasm. Mild diffuse dilatation of small bowel and nondependent colon, most  likely due to ileus. Small left pleural effusion and free pelvic fluid. Critical Value/emergent results were called by telephone at the time of interpretation on 07/18/2023 at 4:46 pm to provider DAVID TAT , who verbally acknowledged these results. Electronically Signed   By: Danae Orleans M.D.   On: 07/18/2023 17:04   DG Abd 1 View  Result Date: 07/18/2023 CLINICAL DATA:  Ileus EXAM: ABDOMEN - 1 VIEW COMPARISON:  07/16/2023  FINDINGS: NG tube remains positioned within the stomach. Decreasing size and number of dilated small bowel loops. Small bowel dilation now measures up to 4.4 cm (previously 5.4 cm). Persistent gaseous distension of the transverse colon. No gross free intraperitoneal air. IMPRESSION: Decreasing size and number of dilated small bowel loops. Appearance suggests resolving ileus or obstruction. Continued radiographic follow-up is recommended. Electronically Signed   By: Duanne Guess D.O.   On: 07/18/2023 09:42   DG Abd 1 View  Result Date: 07/16/2023 CLINICAL DATA:  Ileus, abdominal distention EXAM: ABDOMEN - 1 VIEW COMPARISON:  Previous studies including the examination of 07/15/2023 FINDINGS: There are dilated small bowel loops measuring up to 5.4 cm in diameter. Small-bowel dilation appears slightly more prominent. Mucosal folds in the small bowel loops appear prominent. Gas is present in right colon. Tip of NG tube is seen in the stomach. IMPRESSION: There is dilation of small-bowel loops along with mucosal thickening. Findings may suggest ileus or enteritis or distal colonic obstruction. Electronically Signed   By: Ernie Avena M.D.   On: 07/16/2023 11:41   DG Abd 1 View  Result Date: 07/15/2023 CLINICAL DATA:  [id number] Ileus [facility]) [id number] EXAM: ABDOMEN - 1 VIEW COMPARISON:  07/14/2023 FINDINGS: Enteric tube remains positioned within the stomach. Persistently dilated loops of small bowel within the abdomen, similar to prior. No gross free intraperitoneal air on supine view. IMPRESSION: Persistently dilated loops of small bowel within the abdomen, ileus versus obstruction. Electronically Signed   By: Duanne Guess D.O.   On: 07/15/2023 13:17   DG CHEST PORT 1 VIEW  Result Date: 07/15/2023 CLINICAL DATA:  Fever EXAM: PORTABLE CHEST 1 VIEW COMPARISON:  07/09/2023 FINDINGS: New enteric tube which extends into the stomach. The heart size and mediastinal contours are within normal limits. Both lungs  are clear. The visualized skeletal structures are unremarkable. Gaseous distension of bowel within the imaged upper abdomen. IMPRESSION: 1. No active cardiopulmonary disease. 2. New enteric tube which extends into the stomach. Electronically Signed   By: Duanne Guess D.O.   On: 07/15/2023 13:15   [place] EKG SITE RITE  Result Date: 07/14/2023 If Site Rite image not attached, placement could not be confirmed due to current cardiac rhythm.  DG Abd Portable 1V  Result Date: 07/14/2023 CLINICAL DATA:  NG tube placement EXAM: PORTABLE ABDOMEN - 1 VIEW COMPARISON:  Previous studies including the examination none earlier today FINDINGS: There is interval placement of NG tube with its tip and side port within the stomach. Dilation of small-bowel loops in central abdomen has not changed. Gas is present in ascending and transverse colon. IMPRESSION: Tip of enteric tube is seen in the stomach. No change is seen in small bowel dilation. Electronically Signed   By: Ernie Avena M.D.   On: 07/14/2023 13:11   DG Abd 1 View  Result Date: 07/14/2023 CLINICAL DATA:  Abdominal distention EXAM: ABDOMEN - 1 VIEW COMPARISON:  Previous studies including the examination of 07/13/2023 FINDINGS: There is dilation of small-bowel loops in upper and mid abdomen. Gas is present  in transverse colon. There is no demonstrable gas in rectosigmoid. No significant changes are noted in small bowel dilation. IMPRESSION: There is dilation of small bowel loops suggesting possible ileus or partial obstruction. No significant changes are noted. Electronically Signed   By: Ernie Avena M.D.   On: 07/14/2023 13:09   DG Abd 1 View  Result Date: 07/13/2023 CLINICAL DATA:  Follow-up ileus, initial encounter EXAM: ABDOMEN - 1 VIEW COMPARISON:  None Available. FINDINGS: Scattered large and small bowel gas is noted. Multiple dilated loops of small bowel are again noted similar to that seen on the prior exam. No free air is seen. No mass  lesion is noted. No bony abnormality is seen. IMPRESSION: Stable small bowel dilatation centrally. Electronically Signed   By: Alcide Clever M.D.   On: 07/13/2023 20:09   DG Abd 1 View  Result Date: 07/13/2023 CLINICAL DATA:  Abdominal distension. EXAM: ABDOMEN - 1 VIEW COMPARISON:  07/12/2023 FINDINGS: Persistent gaseous distension of bowel loops throughout the abdomen and pelvis. The degree of bowel distension has minimally changed. Upper abdomen is incompletely imaged and not clear if nasogastric tube is still present. Cannot evaluate for free air on the supine image. IMPRESSION: Persistent gaseous distension of small and large bowel loops. Based on the previous CT findings, a distal colonic obstruction cannot be excluded. Bowel gas distension has minimally changed. Electronically Signed   By: Richarda Overlie M.D.   On: 07/13/2023 16:33   DG Abd 1 View  Result Date: 07/12/2023 CLINICAL DATA:  Nasogastric tube placement. EXAM: ABDOMEN - 1 VIEW COMPARISON:  Radiograph and CT earlier today FINDINGS: Tip and side port of the enteric tube below the diaphragm in the stomach. Gaseous bowel distension persists throughout the abdomen. IMPRESSION: Tip and side port of the enteric tube below the diaphragm in the stomach. Electronically Signed   By: Narda Rutherford M.D.   On: 07/12/2023 17:06   CT ABDOMEN PELVIS WO CONTRAST  Result Date: 07/12/2023 CLINICAL DATA:  Abdominal pain, acute. EXAM: CT ABDOMEN AND PELVIS WITHOUT CONTRAST TECHNIQUE: Multidetector CT imaging of the abdomen and pelvis was performed following the standard protocol without IV contrast. RADIATION DOSE REDUCTION: This exam was performed according to the departmental dose-optimization program which includes automated exposure control, adjustment of the mA and/or kV according to patient size and/or use of iterative reconstruction technique. COMPARISON:  Abdominal radiographs 07/12/2023. Abdominal ultrasound 07/06/2023. FINDINGS: Lower chest: Trace  bilateral pleural effusions with associated dependent atelectasis at both lung bases. No consolidation or significant pericardial fluid. There is moderate distal esophageal wall thickening which is nonspecific. Hepatobiliary: No suspicious hepatic findings are identified on noncontrast imaging. In the dome of the left hepatic lobe, there is a 9 mm low-density lesion which is likely a cyst. No evidence of gallstones, gallbladder wall thickening or biliary dilatation. Pancreas: Unremarkable. No pancreatic ductal dilatation or surrounding inflammatory changes. Spleen: Normal in size without focal abnormality. Adrenals/Urinary Tract: Both adrenal glands appear normal. No evidence of urinary tract calculus, suspicious renal lesion or hydronephrosis. Mild nonspecific perinephric soft tissue stranding bilaterally. The bladder appears unremarkable for its degree of distention. Stomach/Bowel: There may be a minimal amount of enteric contrast dependently in the proximal stomach. The stomach and proximal small bowel are not significantly distended. However, as seen on earlier radiographs, there are multiple dilated and fluid-filled loops of mid to distal small bowel and colon. The colon is dilated into the mid descending colon. The sigmoid colon and rectum are relatively decompressed, although did  no focal transition point or obvious colonic mass is identified on this noncontrast study. There is no bowel wall thickening or pneumatosis. There is some high density material within the lumen of the appendix, and the appendix appears mildly distended, although demonstrates no wall thickening or focal surrounding inflammation. A rectal tube is in place. Vascular/Lymphatic: There are no enlarged abdominal or pelvic lymph nodes. Aortic and branch vessel atherosclerosis without evidence of aneurysm. Vascular patency not addressed without contrast. Reproductive: The uterus and ovaries appear normal. No adnexal mass. Other: Moderate  amount of low-density ascites. There is soft tissue stranding throughout the mesenteric fat. Possible peritoneal nodularity in the right mid abdomen measuring up to 2.3 x 1.2 cm on image 44/2. No pneumoperitoneum or abdominal wall hernia identified. Musculoskeletal: No acute or significant osseous findings. Unless specific follow-up recommendations are mentioned in the findings or impression sections, no imaging follow-up of any mentioned incidental findings is recommended. IMPRESSION: 1. Multiple dilated and fluid-filled loops of mid to distal small bowel and colon with relative decompression of the sigmoid colon and rectum. No focal transition point or obvious colonic mass is identified on this study which is limited by the lack of intravenous and enteric contrast. Findings could be secondary to an ileus from underlying peritonitis, although are concerning for possible distal colonic obstruction. Recommend further evaluation with colonoscopy. 2. Moderate amount of low-density ascites with soft tissue stranding throughout the mesenteric fat and possible peritoneal nodularity in the right mid abdomen. No evidence of bowel perforation. Cannot exclude peritoneal carcinomatosis. 3. Mildly prominent appendix without evidence of acute inflammation. 4. Trace bilateral pleural effusions with associated dependent atelectasis at both lung bases. 5. Distal esophageal wall thickening, nonspecific, but potentially related to esophagitis. 6.  Aortic Atherosclerosis (ICD10-I70.0). Electronically Signed   By: Carey Bullocks M.D.   On: 07/12/2023 13:24   DG Abd 1 View  Result Date: 07/12/2023 CLINICAL DATA:  Abdominal pain EXAM: ABDOMEN - 1 VIEW COMPARISON:  None Available. FINDINGS: Significantly dilated bowel loops. No definite free air on this limited supine radiograph. No abnormal calcifications identified although bowel loops limited assessment. Lower lumbar degenerative change. IMPRESSION: Significantly dilated bowel  loops which could represent obstruction or ileus. Consider CT of the abdomen/pelvis for further evaluation. Electronically Signed   By: Feliberto Harts M.D.   On: 07/12/2023 08:31   DG CHEST PORT 1 VIEW  Result Date: 07/09/2023 CLINICAL DATA:  Cough, alcohol withdrawal. EXAM: PORTABLE CHEST 1 VIEW COMPARISON:  Chest radiograph 04/08/2023 FINDINGS: The cardiomediastinal silhouette is normal There is no focal consolidation or pulmonary edema. There is no pleural effusion or pneumothorax There is no acute osseous abnormality. IMPRESSION: No radiographic evidence of acute cardiopulmonary process. Electronically Signed   By: Lesia Hausen M.D.   On: 07/09/2023 12:12   US Abdomen Limited RUQ (LIVER/GB)  Result Date: 07/06/2023 CLINICAL DATA:  Transaminitis EXAM: ULTRASOUND ABDOMEN LIMITED RIGHT UPPER QUADRANT COMPARISON:  None Available. FINDINGS: Gallbladder: No gallstones or wall thickening visualized. No sonographic Murphy sign noted by sonographer. Common bile duct: Diameter: 0.3 cm Liver: No focal lesion identified. Increased parenchymal echogenicity. Portal vein is patent on color Doppler imaging with normal direction of blood flow towards the liver. Other: None. IMPRESSION: 1. Hepatic steatosis. 2. Otherwise unremarkable right upper quadrant ultrasound. Electronically Signed   By: Jearld Lesch M.D.   On: 07/06/2023 14:47    Vassie Loll, MD  [facility]  If 7PM-7AM, please contact night-coverage [email or web address] Password [email or web address] 07/23/2023, 6:30 PM   LOS: 18  days

## 2023-07-24 DIAGNOSIS — E876 Hypokalemia: Secondary | ICD-10-CM | POA: Diagnosis not present

## 2023-07-24 DIAGNOSIS — F17200 Nicotine dependence, unspecified, uncomplicated: Secondary | ICD-10-CM | POA: Diagnosis not present

## 2023-07-24 DIAGNOSIS — D696 Thrombocytopenia, unspecified: Secondary | ICD-10-CM | POA: Diagnosis not present

## 2023-07-24 DIAGNOSIS — S36039A Unspecified laceration of spleen, initial encounter: Secondary | ICD-10-CM

## 2023-07-24 DIAGNOSIS — F10931 Alcohol use, unspecified with withdrawal delirium: Secondary | ICD-10-CM | POA: Diagnosis not present

## 2023-07-24 LAB — GLUCOSE, CAPILLARY: Glucose-Capillary: 115 mg/dL — ABNORMAL HIGH (ref 70–99)

## 2023-07-24 MED ORDER — NICOTINE 14 MG/24HR TD PT24: 14.0000 mg | MEDICATED_PATCH | Freq: Every day | TRANSDERMAL | 1 refills | Status: DC

## 2023-07-24 MED ORDER — METOPROLOL TARTRATE 25 MG PO TABS: 12.5000 mg | ORAL_TABLET | Freq: Two times a day (BID) | ORAL | 2 refills | Status: DC

## 2023-07-24 MED ORDER — CLONIDINE 0.2 MG/24HR TD PTWK: 0.2000 mg | MEDICATED_PATCH | TRANSDERMAL | 4 refills | Status: DC

## 2023-07-24 MED ORDER — FOLIC ACID 1 MG PO TABS: 1.0000 mg | ORAL_TABLET | Freq: Every day | ORAL | 3 refills | Status: DC

## 2023-07-24 MED ORDER — SENNOSIDES-DOCUSATE SODIUM 8.6-50 MG PO TABS: 1.0000 | ORAL_TABLET | Freq: Two times a day (BID) | ORAL | 1 refills | Status: DC

## 2023-07-24 MED ORDER — POLYETHYLENE GLYCOL 3350 17 G PO PACK: 17.0000 g | PACK | Freq: Every day | ORAL | 0 refills | Status: DC

## 2023-07-24 MED ORDER — PANTOPRAZOLE SODIUM 40 MG PO TBEC: 40.0000 mg | DELAYED_RELEASE_TABLET | Freq: Every day | ORAL | 3 refills | Status: DC

## 2023-07-24 MED ORDER — VITAMIN B-1 100 MG PO TABS: 100.0000 mg | ORAL_TABLET | Freq: Every day | ORAL | 1 refills | Status: DC

## 2023-07-24 MED ORDER — ENSURE ENLIVE PO LIQD: 237.0000 mL | Freq: Three times a day (TID) | ORAL | Status: DC

## 2023-07-24 NOTE — Discharge Summary (Signed)
Physician Discharge Summary   Patient: NYLAH BUTKUS MRN: [id number] DOB: 10-Oct-1971  Admit date:     07/05/2023  Discharge date: 07/24/23  Discharge Physician: Vassie Loll   PCP: Rica Records, FNP   Recommendations at discharge:  Repeat CBC to follow hemoglobin trend/stability Repeat complete metabolic panel to follow electrolytes, renal function and LFTs Continue assisting patient with complete alcohol abstinence Repeat CT scan in 6-8 weeks to follow-up stability/resolution of a spleen laceration. Reassess blood pressure and adjust antihypertensive regimen as needed Continue assisting patient with a smoking cessation  Discharge Diagnoses: Principal Problem:   Alcohol withdrawal (HCC) Active Problems:   Nausea & vomiting   Essential hypertension   Transaminitis   Tobacco use disorder   Hypokalemia   Thrombocytopenia (HCC)   Hypothermia   Elevated LFTs   Colonic obstruction (HCC)   Ileus (HCC)   Laceration of spleen  Hospital Course:  52 y.o. female with medical history significant of hypertension, alcohol abuse, tobacco use disorder who presents to the emergency department from home via EMS due to complaints of generalized bodyaches, nausea, vomiting which she thinks was related to alcohol withdrawal. Patient usually drinks 4 40 ounce beers daily, but she only had 2 12 ounce beers today.  Patient complained of pain in chest, legs, arms and endorsed having had some falls without any injury.  Her hospitalization has been prolonged secondary to delirium and agitation presumably from alcohol withdrawal.  She has been on Precedex since 07/07/2023.  Attempts at weaning had resulted in increased agitation.  Unfortunately, her hospitalization has been prolonged secondary to development of ileus.  NG was inserted to low intermittent suction on 07/14/2023.  PICC line placed on 07/15/23 and TPN was initiated due to prolonged period of no oral intake and prolonged ileus.   On  07/16/23, pt began showing signs of return of bowel function with 3 BMs in last 24 hours.  She developed a fever up to 102.2.  UA was negative.  Blood cultures were drawn and pt started on empiric unasyn  On 07/17/2023 in the late morning, the patient's Precedex was weaned off.  The patient remained fairly lucid and redirectable. She had one BM in last 24 hours.  TPN continues  On 07/10/2023, the patient continued to have fever up to 101.3 F.  She has had 2 bowel movements in last 24 hours.  She had NG output of 1150, but abdomen was less distended.  Assessment and Plan: Acute metabolic encephalopathy/delirium tremens -Patient has been on Precedex and 07/07/2023 -Attempts at weaning has resulted in increased agitation -Unfortunately, the patient's ileus has limited oral medications to help mitigate her agitation -C no requiring Precedex, Librium or Ativan -Complete withdrawal episode resolved -Continue thiamine and folic acid at discharge. -/731/24: Continue supportive care.  Patient planning no further alcohol use in the future. -Patient tolerating diet, following commands appropriately and demonstrating no further signs of withdrawal. -Outpatient follow-up at [facility] recommended to continue assisting with maintaining complete abstinence; TOC has provided information and resources.   Abdominal ileus -07/12/2023 CT AP--multiple dilated fluid-filled loops of SB and colon -Appreciate GI>>possible neostigmine -07/13/2023 flex sig--no obstruction noted--erythematous mucosa in the transverse colon with dilated in the descending, transverse: -07/14/2023--NG inserted to low intermittent suction -Appreciate general surgery and GI assistance -optimize electrolytes -Overall appears to be improving>> remove NG 07/19/2023 -07/18/2023 CT abdomen shows improving ileus -Check C. Difficile--no BM to collect -07/21/23: Diet advanced to full liquid with intention to further increase consistency if  tolerated. -Patient tolerating diet at discharge and feeling ready to go home. -Patient advised to continue the use of feeding supplements and to maintain adequate hydration. -Continue as needed laxative/bowel regimen as recommended by general surgery.   Splenic phlegmon/laceration? -Case discussed with general surgery, Dr. Robyne Peers -Case discussed with IR, Dr. Deanne Coffer -Primary considerations at this time--hemorrhage versus infection -Patient has remained afebrile and is stable for discharge. -Tolerating diet. -Repeated CT scan demonstrating stable findings with suggestion most likely to laceration.  No abscess or drainable fluid  -per general surgery okay to DC antibiotics. -Repeat CT scan in 6-8 weeks to assess spleen laceration and stability and resolution.   Fever -developed fever 102.2 in am 7/26 -fever 101.2 am 7/28 -blood cultures--neg to date -UA neg for pyuria -7/25 CXR personally reviewed--increased interstitial markings -KUB--personally reviewed--persistent bowel dilatation, unchanged -PCT 0.11 -Lactic acid within normal limits. -CT chest/ AP--Heterogeneous and poorly defined low-attenuation lesion involving the majority of the spleen. Differential diagnosis includes splenic laceration/hematoma, infarct, phlegmon/developing abscess, or less likely neoplasm. -Patient has remained afebrile for over 24 hours; WBCs down to 11 range and per findings on CT after discussing with general surgery okay to discontinue antibiotics and observe clinical response. -No further fever appreciated.  Repeat CT scan in about 6-weeks to assess spleen laceration and stability.   Atrial tachycardia -Continue diltiazem and metoprolol -Heart rate is stable and within normal limits at discharge.   FEN -Continue to replete electrolytes as needed -Feeding supplement and diet advancement to be provided -TPN discontinue after losing PICC line and making improvement in oral intake. -So far  tolerating soft diet.   Essential hypertension -Irbesartan and diltiazem were initially discontinued secondary to low blood pressure. -Patient blood pressure has improve/stabilize while receiving metoprolol, clonidine and Cardizem -Reassess blood pressure at follow-up visit and further adjust antihypertensive regimen.   Thrombocytopenia -Secondary to myelosuppression from alcohol use -No signs of overt bleeding appreciated -Continue to follow platelet count trend/stability with repeat CBC at follow-up visit.   Transaminasemia -In the setting of alcohol abuse -Cessation counseling provided and complete abstinence encouraged -LFTs continue trending down antistaphylolysin -Continue outpatient monitoring.   Tobacco cessation -Cessation counseling provided -Continue nicotine patch.   Hypokalemia -Magnesium within normal limits -Stable potassium level at discharge; continue outpatient follow-up of electrolytes trend.  6-8 weeks to assess spleen laceration and stability.   Consultants: General surgery Procedures performed: See below for x-ray report Disposition: Home Diet recommendation: Heart healthy/low-sodium diet.  DISCHARGE MEDICATION: Allergies as of 07/24/2023   No Known Allergies      Medication List     STOP taking these medications    chlordiazePOXIDE 5 MG capsule Commonly known as: LIBRIUM   cyclobenzaprine 5 MG tablet Commonly known as: FLEXERIL   naltrexone 50 MG tablet Commonly known as: DEPADE   naproxen 500 MG tablet Commonly known as: NAPROSYN   olmesartan 20 MG tablet Commonly known as: BENICAR   ondansetron 4 MG tablet Commonly known as: ZOFRAN   QUEtiapine 25 MG tablet Commonly known as: SEROQUEL       TAKE these medications    cloNIDine 0.2 mg/24hr patch Commonly known as: CATAPRES - Dosed in mg/24 hr Place 1 patch (0.2 mg total) onto the skin once a week.   diltiazem 120 MG 24 hr capsule Commonly known as: CARDIZEM CD Take 1  capsule (120 mg total) by mouth daily.   feeding supplement Liqd Take 237 mLs by mouth 3 (three) times daily between meals.   folic acid 1  MG tablet Commonly known as: FOLVITE Take 1 tablet (1 mg total) by mouth daily. Start taking on: July 25, 2023   metoprolol tartrate 25 MG tablet Commonly known as: LOPRESSOR Take 0.5 tablets (12.5 mg total) by mouth 2 (two) times daily.   nicotine 14 mg/24hr patch Commonly known as: NICODERM CQ - dosed in mg/24 hours Place 1 patch (14 mg total) onto the skin daily.   pantoprazole 40 MG tablet Commonly known as: Protonix Take 1 tablet (40 mg total) by mouth daily.   polyethylene glycol 17 g packet Commonly known as: MIRALAX / GLYCOLAX Take 17 g by mouth daily. Start taking on: July 25, 2023   senna-docusate 8.6-50 MG tablet Commonly known as: Senokot-S Take 1 tablet by mouth 2 (two) times daily.   thiamine 100 MG tablet Commonly known as: Vitamin B-1 Take 1 tablet (100 mg total) by mouth daily. Start taking on: July 25, 2023               Durable Medical Equipment  (From admission, onward)           Start     Ordered   07/19/23 1150  For home use only DME Walker rolling  Once       Comments: Patient at high risk for falls when taking steps without AD, safer using RW due to weakness.  Question Answer Comment  Walker: With 5 Inch Wheels   Patient needs a walker to treat with the following condition Gait difficulty      07/19/23 1150            Follow-up Information     Services, [facility]. Schedule an appointment as soon as possible for a visit.   Why: for substance abuse counsiling Contact information: [address] [place] [address] [phone]         Rica Records, FNP. Schedule an appointment as soon as possible for a visit in 10 day(s).   Specialty: Family Medicine Contact information: [address] [place] [address] [phone]                 Discharge Exam: Ceasar Mons Weights   07/22/23 0500 07/23/23 0347 07/24/23 0602  Weight: 66.5 kg 62.6 kg 62.1 kg   General exam: Alert, awake, oriented x 3; no fever, no chest pain, no nausea, no vomiting.  Expressed so far good tolerance to soft diet. Respiratory system: Clear to auscultation. Respiratory effort normal.  Good saturation on room air. Cardiovascular system: No rubs or gallops.ate controlled. Gastrointestinal system: Abdomen is soft, nontender and with positive bowel sounds. Central nervous system: Alert and oriented. No focal neurological deficits. Extremities: No cyanosis or clubbing. Skin: No petechiae. Psychiatry: Judgement and insight appear normal. Mood & affect appropriate.   Condition at discharge: Stable and improved.  The results of significant diagnostics from this hospitalization (including imaging, microbiology, ancillary and laboratory) are listed below for reference.   Imaging Studies: CT ABDOMEN PELVIS W CONTRAST  Result Date: 07/22/2023 CLINICAL DATA:  Abdominal pain. Ileus. Follow-up splenic laceration. EXAM: CT ABDOMEN AND PELVIS WITH CONTRAST TECHNIQUE: Multidetector CT imaging of the abdomen and pelvis was performed using the standard protocol following bolus administration of intravenous contrast. RADIATION DOSE REDUCTION: This exam was performed according to the departmental dose-optimization program which includes automated exposure control, adjustment of the mA and/or kV according to patient size and/or use of iterative reconstruction technique. CONTRAST:  OMNIPAQUE IOHEXOL 300 MG/ML  SOLN COMPARISON:  07/18/2023 FINDINGS: Lower Chest: No acute findings. Previously seen small pleural effusions have resolved. Hepatobiliary: No suspicious hepatic masses identified. Gallbladder is unremarkable. No evidence of biliary ductal dilatation. Pancreas:  No mass or inflammatory changes. Spleen: Stable appearance of complex low-attenuation lesion involving the  majority of the spleen. This has somewhat linear or geographic margins in some locations, suggesting splenic laceration/hematoma or infarct, with infection or neoplasm considered less likely. No evidence of perisplenic hematoma or hemoperitoneum. Adrenals/Urinary Tract: No suspicious masses identified. No evidence of ureteral calculi or hydronephrosis. Stomach/Bowel: Generalized mild dilatation of small bowel and nondependent colon is seen, mildly increased in suspicious for adynamic ileus. No evidence of obstruction, focal inflammatory process or abnormal fluid collections. No No evidence of free intraperitoneal air. Vascular/Lymphatic: No pathologically enlarged lymph nodes. No acute vascular findings. Reproductive: Tiny less than 1 cm posterior uterine fibroid noted. Otherwise unremarkable. Other:  None. Musculoskeletal:  No suspicious bone lesions identified. IMPRESSION: Stable appearance of complex low-attenuation lesion involving the majority of the spleen. Most likely differential diagnosis is splenic laceration or infarct, with infection or neoplasm considered less likely. No evidence of perisplenic hematoma or hemoperitoneum. Increased mild dilatation of small bowel and nondependent colon, suspicious for adynamic ileus. Tiny less than 1 cm posterior uterine fibroid. Electronically Signed   By: Danae Orleans M.D.   On: 07/22/2023 13:59   CT ABDOMEN PELVIS W CONTRAST  Result Date: 07/18/2023 CLINICAL DATA:  Fever. Respiratory illness. Abdominal pain. Ileus. Colitis. EXAM: CT CHEST, ABDOMEN, AND PELVIS WITH CONTRAST TECHNIQUE: Multidetector CT imaging of the chest, abdomen and pelvis was performed following the standard protocol during bolus administration of intravenous contrast. RADIATION DOSE REDUCTION: This exam was performed according to the departmental dose-optimization program which includes automated exposure control, adjustment of the mA and/or kV according to patient size and/or use of iterative  reconstruction technique. CONTRAST:  OMNIPAQUE IOHEXOL 300 MG/ML  SOLN COMPARISON:  Noncontrast AP CT on 07/12/2023 FINDINGS: CT CHEST FINDINGS Cardiovascular: No acute findings. Mediastinum/Lymph Nodes: No masses or pathologically enlarged lymph nodes identified. Lungs/Pleura: No suspicious pulmonary nodules or masses identified. No No evidence of pulmonary consolidation or central endobronchial obstruction. Small left pleural effusion noted as well as mild dependent atelectasis. Musculoskeletal:  No suspicious bone lesions identified. CT ABDOMEN AND PELVIS FINDINGS Hepatobiliary: No masses identified. Gallbladder is unremarkable. No evidence of biliary ductal dilatation. Pancreas:  No mass or inflammatory changes. Spleen: Within normal limits in size. Heterogeneous and poorly defined low-attenuation lesion is seen involving the majority of the spleen, without perisplenic fluid. Differential diagnosis includes splenic laceration, infarct, abscess, or less likely neoplasm. Adrenals/Urinary tract: No suspicious masses or hydronephrosis. Stomach/Bowel: Nasogastric tube tip in body of stomach. Mild diffuse dilatation of small bowel and nondependent colon, most likely due to ileus. No evidence of obstruction, inflammatory process, or abnormal fluid collections. Normal appendix visualized. Vascular/Lymphatic: No pathologically enlarged lymph nodes identified. No acute vascular findings. Reproductive: No mass or inflammatory changes identified. Small amount of low-attenuation free fluid seen in pelvic cul-de-sac. Other:  None. Musculoskeletal:  No suspicious bone lesions identified. IMPRESSION: Heterogeneous and poorly defined low-attenuation lesion involving the majority of the spleen. Differential diagnosis includes splenic laceration/hematoma, infarct, phlegmon/developing abscess, or less likely neoplasm. Mild diffuse dilatation of small bowel and nondependent colon, most likely due to ileus. Small left pleural  effusion and free pelvic fluid. Critical Value/emergent results were called by telephone at the time of interpretation on 07/18/2023 at 4:46 pm to provider DAVID TAT , who verbally acknowledged  these results. Electronically Signed   By: Danae Orleans M.D.   On: 07/18/2023 17:04   CT CHEST W CONTRAST  Result Date: 07/18/2023 CLINICAL DATA:  Fever. Respiratory illness. Abdominal pain. Ileus. Colitis. EXAM: CT CHEST, ABDOMEN, AND PELVIS WITH CONTRAST TECHNIQUE: Multidetector CT imaging of the chest, abdomen and pelvis was performed following the standard protocol during bolus administration of intravenous contrast. RADIATION DOSE REDUCTION: This exam was performed according to the departmental dose-optimization program which includes automated exposure control, adjustment of the mA and/or kV according to patient size and/or use of iterative reconstruction technique. CONTRAST:  OMNIPAQUE IOHEXOL 300 MG/ML  SOLN COMPARISON:  Noncontrast AP CT on 07/12/2023 FINDINGS: CT CHEST FINDINGS Cardiovascular: No acute findings. Mediastinum/Lymph Nodes: No masses or pathologically enlarged lymph nodes identified. Lungs/Pleura: No suspicious pulmonary nodules or masses identified. No No evidence of pulmonary consolidation or central endobronchial obstruction. Small left pleural effusion noted as well as mild dependent atelectasis. Musculoskeletal:  No suspicious bone lesions identified. CT ABDOMEN AND PELVIS FINDINGS Hepatobiliary: No masses identified. Gallbladder is unremarkable. No evidence of biliary ductal dilatation. Pancreas:  No mass or inflammatory changes. Spleen: Within normal limits in size. Heterogeneous and poorly defined low-attenuation lesion is seen involving the majority of the spleen, without perisplenic fluid. Differential diagnosis includes splenic laceration, infarct, abscess, or less likely neoplasm. Adrenals/Urinary tract: No suspicious masses or hydronephrosis. Stomach/Bowel: Nasogastric tube tip in  body of stomach. Mild diffuse dilatation of small bowel and nondependent colon, most likely due to ileus. No evidence of obstruction, inflammatory process, or abnormal fluid collections. Normal appendix visualized. Vascular/Lymphatic: No pathologically enlarged lymph nodes identified. No acute vascular findings. Reproductive: No mass or inflammatory changes identified. Small amount of low-attenuation free fluid seen in pelvic cul-de-sac. Other:  None. Musculoskeletal:  No suspicious bone lesions identified. IMPRESSION: Heterogeneous and poorly defined low-attenuation lesion involving the majority of the spleen. Differential diagnosis includes splenic laceration/hematoma, infarct, phlegmon/developing abscess, or less likely neoplasm. Mild diffuse dilatation of small bowel and nondependent colon, most likely due to ileus. Small left pleural effusion and free pelvic fluid. Critical Value/emergent results were called by telephone at the time of interpretation on 07/18/2023 at 4:46 pm to provider DAVID TAT , who verbally acknowledged these results. Electronically Signed   By: Danae Orleans M.D.   On: 07/18/2023 17:04   DG Abd 1 View  Result Date: 07/18/2023 CLINICAL DATA:  Ileus EXAM: ABDOMEN - 1 VIEW COMPARISON:  07/16/2023 FINDINGS: NG tube remains positioned within the stomach. Decreasing size and number of dilated small bowel loops. Small bowel dilation now measures up to 4.4 cm (previously 5.4 cm). Persistent gaseous distension of the transverse colon. No gross free intraperitoneal air. IMPRESSION: Decreasing size and number of dilated small bowel loops. Appearance suggests resolving ileus or obstruction. Continued radiographic follow-up is recommended. Electronically Signed   By: Duanne Guess D.O.   On: 07/18/2023 09:42   DG Abd 1 View  Result Date: 07/16/2023 CLINICAL DATA:  Ileus, abdominal distention EXAM: ABDOMEN - 1 VIEW COMPARISON:  Previous studies including the examination of 07/15/2023 FINDINGS:  There are dilated small bowel loops measuring up to 5.4 cm in diameter. Small-bowel dilation appears slightly more prominent. Mucosal folds in the small bowel loops appear prominent. Gas is present in right colon. Tip of NG tube is seen in the stomach. IMPRESSION: There is dilation of small-bowel loops along with mucosal thickening. Findings may suggest ileus or enteritis or distal colonic obstruction. Electronically Signed   By: Ernie Avena  M.D.   On: 07/16/2023 11:41   DG Abd 1 View  Result Date: 07/15/2023 CLINICAL DATA:  [id number] Ileus (HCC) [id number] EXAM: ABDOMEN - 1 VIEW COMPARISON:  07/14/2023 FINDINGS: Enteric tube remains positioned within the stomach. Persistently dilated loops of small bowel within the abdomen, similar to prior. No gross free intraperitoneal air on supine view. IMPRESSION: Persistently dilated loops of small bowel within the abdomen, ileus versus obstruction. Electronically Signed   By: Duanne Guess D.O.   On: 07/15/2023 13:17   DG CHEST PORT 1 VIEW  Result Date: 07/15/2023 CLINICAL DATA:  Fever EXAM: PORTABLE CHEST 1 VIEW COMPARISON:  07/09/2023 FINDINGS: New enteric tube which extends into the stomach. The heart size and mediastinal contours are within normal limits. Both lungs are clear. The visualized skeletal structures are unremarkable. Gaseous distension of bowel within the imaged upper abdomen. IMPRESSION: 1. No active cardiopulmonary disease. 2. New enteric tube which extends into the stomach. Electronically Signed   By: Duanne Guess D.O.   On: 07/15/2023 13:15   [place] EKG SITE RITE  Result Date: 07/14/2023 If Site Rite image not attached, placement could not be confirmed due to current cardiac rhythm.  DG Abd Portable 1V  Result Date: 07/14/2023 CLINICAL DATA:  NG tube placement EXAM: PORTABLE ABDOMEN - 1 VIEW COMPARISON:  Previous studies including the examination none earlier today FINDINGS: There is interval placement of NG tube with its tip and side  port within the stomach. Dilation of small-bowel loops in central abdomen has not changed. Gas is present in ascending and transverse colon. IMPRESSION: Tip of enteric tube is seen in the stomach. No change is seen in small bowel dilation. Electronically Signed   By: Ernie Avena M.D.   On: 07/14/2023 13:11   DG Abd 1 View  Result Date: 07/14/2023 CLINICAL DATA:  Abdominal distention EXAM: ABDOMEN - 1 VIEW COMPARISON:  Previous studies including the examination of 07/13/2023 FINDINGS: There is dilation of small-bowel loops in upper and mid abdomen. Gas is present in transverse colon. There is no demonstrable gas in rectosigmoid. No significant changes are noted in small bowel dilation. IMPRESSION: There is dilation of small bowel loops suggesting possible ileus or partial obstruction. No significant changes are noted. Electronically Signed   By: Ernie Avena M.D.   On: 07/14/2023 13:09   DG Abd 1 View  Result Date: 07/13/2023 CLINICAL DATA:  Follow-up ileus, initial encounter EXAM: ABDOMEN - 1 VIEW COMPARISON:  None Available. FINDINGS: Scattered large and small bowel gas is noted. Multiple dilated loops of small bowel are again noted similar to that seen on the prior exam. No free air is seen. No mass lesion is noted. No bony abnormality is seen. IMPRESSION: Stable small bowel dilatation centrally. Electronically Signed   By: Alcide Clever M.D.   On: 07/13/2023 20:09   DG Abd 1 View  Result Date: 07/13/2023 CLINICAL DATA:  Abdominal distension. EXAM: ABDOMEN - 1 VIEW COMPARISON:  07/12/2023 FINDINGS: Persistent gaseous distension of bowel loops throughout the abdomen and pelvis. The degree of bowel distension has minimally changed. Upper abdomen is incompletely imaged and not clear if nasogastric tube is still present. Cannot evaluate for free air on the supine image. IMPRESSION: Persistent gaseous distension of small and large bowel loops. Based on the previous CT findings, a distal  colonic obstruction cannot be excluded. Bowel gas distension has minimally changed. Electronically Signed   By: Richarda Overlie M.D.   On: 07/13/2023 16:33   DG Abd 1 View  Result Date: 07/12/2023 CLINICAL DATA:  Nasogastric tube placement. EXAM: ABDOMEN - 1 VIEW COMPARISON:  Radiograph and CT earlier today FINDINGS: Tip and side port of the enteric tube below the diaphragm in the stomach. Gaseous bowel distension persists throughout the abdomen. IMPRESSION: Tip and side port of the enteric tube below the diaphragm in the stomach. Electronically Signed   By: Narda Rutherford M.D.   On: 07/12/2023 17:06   CT ABDOMEN PELVIS WO CONTRAST  Result Date: 07/12/2023 CLINICAL DATA:  Abdominal pain, acute. EXAM: CT ABDOMEN AND PELVIS WITHOUT CONTRAST TECHNIQUE: Multidetector CT imaging of the abdomen and pelvis was performed following the standard protocol without IV contrast. RADIATION DOSE REDUCTION: This exam was performed according to the departmental dose-optimization program which includes automated exposure control, adjustment of the mA and/or kV according to patient size and/or use of iterative reconstruction technique. COMPARISON:  Abdominal radiographs 07/12/2023. Abdominal ultrasound 07/06/2023. FINDINGS: Lower chest: Trace bilateral pleural effusions with associated dependent atelectasis at both lung bases. No consolidation or significant pericardial fluid. There is moderate distal esophageal wall thickening which is nonspecific. Hepatobiliary: No suspicious hepatic findings are identified on noncontrast imaging. In the dome of the left hepatic lobe, there is a 9 mm low-density lesion which is likely a cyst. No evidence of gallstones, gallbladder wall thickening or biliary dilatation. Pancreas: Unremarkable. No pancreatic ductal dilatation or surrounding inflammatory changes. Spleen: Normal in size without focal abnormality. Adrenals/Urinary Tract: Both adrenal glands appear normal. No evidence of urinary tract  calculus, suspicious renal lesion or hydronephrosis. Mild nonspecific perinephric soft tissue stranding bilaterally. The bladder appears unremarkable for its degree of distention. Stomach/Bowel: There may be a minimal amount of enteric contrast dependently in the proximal stomach. The stomach and proximal small bowel are not significantly distended. However, as seen on earlier radiographs, there are multiple dilated and fluid-filled loops of mid to distal small bowel and colon. The colon is dilated into the mid descending colon. The sigmoid colon and rectum are relatively decompressed, although did no focal transition point or obvious colonic mass is identified on this noncontrast study. There is no bowel wall thickening or pneumatosis. There is some high density material within the lumen of the appendix, and the appendix appears mildly distended, although demonstrates no wall thickening or focal surrounding inflammation. A rectal tube is in place. Vascular/Lymphatic: There are no enlarged abdominal or pelvic lymph nodes. Aortic and branch vessel atherosclerosis without evidence of aneurysm. Vascular patency not addressed without contrast. Reproductive: The uterus and ovaries appear normal. No adnexal mass. Other: Moderate amount of low-density ascites. There is soft tissue stranding throughout the mesenteric fat. Possible peritoneal nodularity in the right mid abdomen measuring up to 2.3 x 1.2 cm on image 44/2. No pneumoperitoneum or abdominal wall hernia identified. Musculoskeletal: No acute or significant osseous findings. Unless specific follow-up recommendations are mentioned in the findings or impression sections, no imaging follow-up of any mentioned incidental findings is recommended. IMPRESSION: 1. Multiple dilated and fluid-filled loops of mid to distal small bowel and colon with relative decompression of the sigmoid colon and rectum. No focal transition point or obvious colonic mass is identified on this  study which is limited by the lack of intravenous and enteric contrast. Findings could be secondary to an ileus from underlying peritonitis, although are concerning for possible distal colonic obstruction. Recommend further evaluation with colonoscopy. 2. Moderate amount of low-density ascites with soft tissue stranding throughout the mesenteric fat and possible peritoneal nodularity in the right mid abdomen. No evidence  of bowel perforation. Cannot exclude peritoneal carcinomatosis. 3. Mildly prominent appendix without evidence of acute inflammation. 4. Trace bilateral pleural effusions with associated dependent atelectasis at both lung bases. 5. Distal esophageal wall thickening, nonspecific, but potentially related to esophagitis. 6.  Aortic Atherosclerosis (ICD10-I70.0). Electronically Signed   By: Carey Bullocks M.D.   On: 07/12/2023 13:24   DG Abd 1 View  Result Date: 07/12/2023 CLINICAL DATA:  Abdominal pain EXAM: ABDOMEN - 1 VIEW COMPARISON:  None Available. FINDINGS: Significantly dilated bowel loops. No definite free air on this limited supine radiograph. No abnormal calcifications identified although bowel loops limited assessment. Lower lumbar degenerative change. IMPRESSION: Significantly dilated bowel loops which could represent obstruction or ileus. Consider CT of the abdomen/pelvis for further evaluation. Electronically Signed   By: Feliberto Harts M.D.   On: 07/12/2023 08:31   DG CHEST PORT 1 VIEW  Result Date: 07/09/2023 CLINICAL DATA:  Cough, alcohol withdrawal. EXAM: PORTABLE CHEST 1 VIEW COMPARISON:  Chest radiograph 04/08/2023 FINDINGS: The cardiomediastinal silhouette is normal There is no focal consolidation or pulmonary edema. There is no pleural effusion or pneumothorax There is no acute osseous abnormality. IMPRESSION: No radiographic evidence of acute cardiopulmonary process. Electronically Signed   By: Lesia Hausen M.D.   On: 07/09/2023 12:12   US Abdomen Limited RUQ  (LIVER/GB)  Result Date: 07/06/2023 CLINICAL DATA:  Transaminitis EXAM: ULTRASOUND ABDOMEN LIMITED RIGHT UPPER QUADRANT COMPARISON:  None Available. FINDINGS: Gallbladder: No gallstones or wall thickening visualized. No sonographic Murphy sign noted by sonographer. Common bile duct: Diameter: 0.3 cm Liver: No focal lesion identified. Increased parenchymal echogenicity. Portal vein is patent on color Doppler imaging with normal direction of blood flow towards the liver. Other: None. IMPRESSION: 1. Hepatic steatosis. 2. Otherwise unremarkable right upper quadrant ultrasound. Electronically Signed   By: Jearld Lesch M.D.   On: 07/06/2023 14:47    Microbiology: Results for orders placed or performed during the hospital encounter of 07/05/23  MRSA Next Gen by PCR, Nasal     Status: None   Collection Time: 07/05/23 10:10 PM   Specimen: Nasal Mucosa; Nasal Swab  Result Value Ref Range Status   MRSA by PCR Next Gen NOT DETECTED NOT DETECTED Final    Comment: (NOTE) The GeneXpert MRSA Assay (FDA approved for NASAL specimens only), is one component of a comprehensive MRSA colonization surveillance program. It is not intended to diagnose MRSA infection nor to guide or monitor treatment for MRSA infections. Test performance is not FDA approved in patients less than 51 years old. Performed at [facility], [address], [place], [place] [address]   Culture, blood (Routine X 2) w Reflex to ID Panel     Status: None   Collection Time: 07/05/23 11:45 PM   Specimen: BLOOD LEFT ARM  Result Value Ref Range Status   Specimen Description BLOOD LEFT ARM  Final   Special Requests   Final    BOTTLES DRAWN AEROBIC AND ANAEROBIC Blood Culture adequate volume   Culture   Final    NO GROWTH 5 DAYS Performed at [facility], [address], [place], [place] [address]    Report Status 07/11/2023 FINAL  Final  Culture, blood (Routine X 2) w Reflex to ID Panel     Status: None   Collection Time: 07/05/23 11:48 PM    Specimen: BLOOD LEFT HAND  Result Value Ref Range Status   Specimen Description BLOOD LEFT HAND  Final   Special Requests   Final  BOTTLES DRAWN AEROBIC AND ANAEROBIC Blood Culture adequate volume   Culture   Final    NO GROWTH 5 DAYS Performed at [facility], [address] [place], [place] [address]    Report Status 07/11/2023 FINAL  Final  Culture, blood (Routine X 2) w Reflex to ID Panel     Status: None   Collection Time: 07/09/23  9:09 AM   Specimen: BLOOD  Result Value Ref Range Status   Specimen Description BLOOD LEFT ASSIST CONTROL  Final   Special Requests   Final    BOTTLES DRAWN AEROBIC AND ANAEROBIC Blood Culture adequate volume   Culture   Final    NO GROWTH 6 DAYS Performed at [facility], [address], [place], [place] [address]    Report Status 07/15/2023 FINAL  Final  Culture, blood (Routine X 2) w Reflex to ID Panel     Status: None   Collection Time: 07/09/23  9:09 AM   Specimen: BLOOD  Result Value Ref Range Status   Specimen Description BLOOD LEFT HAND  Final   Special Requests   Final    BOTTLES DRAWN AEROBIC AND ANAEROBIC Blood Culture adequate volume   Culture   Final    NO GROWTH 6 DAYS Performed at [facility], [address] [place], [place] [address]    Report Status 07/15/2023 FINAL  Final  Culture, blood (Routine X 2) w Reflex to ID Panel     Status: None   Collection Time: 07/15/23 10:08 AM   Specimen: BLOOD RIGHT HAND  Result Value Ref Range Status   Specimen Description BLOOD RIGHT HAND  Final   Special Requests   Final    BOTTLES DRAWN AEROBIC AND ANAEROBIC Blood Culture adequate volume   Culture   Final    NO GROWTH 5 DAYS Performed at [facility], [address], [place], [place] [address]    Report Status 07/20/2023 FINAL  Final  Culture, blood (Routine X 2) w Reflex to ID Panel     Status: None   Collection Time: 07/15/23 10:08 AM   Specimen: BLOOD  Result Value Ref Range Status   Specimen Description BLOOD BLOOD LEFT  HAND  Final   Special Requests   Final    BOTTLES DRAWN AEROBIC AND ANAEROBIC Blood Culture adequate volume   Culture   Final    NO GROWTH 5 DAYS Performed at [facility], [address], [place], [place] [address]    Report Status 07/20/2023 FINAL  Final  Culture, blood (Routine X 2) w Reflex to ID Panel     Status: None   Collection Time: 07/18/23  6:38 PM   Specimen: BLOOD LEFT FOREARM  Result Value Ref Range Status   Specimen Description BLOOD LEFT FOREARM  Final   Special Requests   Final    BOTTLES DRAWN AEROBIC AND ANAEROBIC Blood Culture results may not be optimal due to an excessive volume of blood received in culture bottles   Culture   Final    NO GROWTH 5 DAYS Performed at [facility], [address] [place], [place] [address]    Report Status 07/23/2023 FINAL  Final  Culture, blood (Routine X 2) w Reflex to ID Panel     Status: None   Collection Time: 07/18/23  6:47 PM   Specimen: BLOOD LEFT HAND  Result Value Ref Range Status   Specimen Description BLOOD LEFT HAND  Final   Special Requests   Final    BOTTLES DRAWN AEROBIC  AND ANAEROBIC BLOOD LEFT HAND   Culture   Final    NO GROWTH 5 DAYS Performed at [facility], [address] [place], [place] [address]    Report Status 07/23/2023 FINAL  Final    Labs: CBC: Recent Labs  Lab 07/17/23 1135 07/18/23 0430 07/19/23 0402 07/20/23 0505 07/21/23 0419  WBC 17.4* 17.2* 19.3* 11.6* 11.2*  NEUTROABS  --   --   --  8.2*  --   HGB 9.1* 8.5* 9.5* 8.8* 8.9*  HCT 26.2* 25.4* 28.6* 26.1* 27.6*  MCV 88.5 89.8 90.2 90.9 92.6  PLT 157 173 218 218 239   Basic Metabolic Panel: Recent Labs  Lab 07/18/23 0430 07/19/23 0402 07/20/23 0505 07/21/23 0419  NA 136 130* 132* 132*  K 3.6 4.1 4.5 4.3  CL 108 103 103 101  CO2 25 21* 25 24  GLUCOSE 155* 174* 180* 147*  BUN 7 8 13 11   CREATININE 0.49 0.45 0.57 0.53  CALCIUM 9.0 9.7 9.3 9.3  MG 1.9 1.9  --  2.0  PHOS 4.5  --   --   --    Liver Function Tests: Recent  Labs  Lab 07/18/23 0430 07/19/23 0402 07/20/23 0505 07/21/23 0419  AST 27 30 40 73*  ALT 17 25 37 73*  ALKPHOS 76 88 78 81  BILITOT 0.6 0.7 0.2* 0.3  PROT 6.2* 7.0 6.4* 7.0  ALBUMIN 2.6* 3.0* 2.7* 3.0*   CBG: Recent Labs  Lab 07/22/23 2334 07/23/23 0534 07/23/23 1721 07/23/23 2336 07/24/23 0604  GLUCAP 90 110* 101* 137* 115*    Discharge time spent: greater than 30 minutes.  Signed: Vassie Loll, MD [facility] 07/24/2023

## 2023-07-26 ENCOUNTER — Telehealth: Payer: Self-pay | Admitting: Family Medicine

## 2023-07-26 NOTE — Telephone Encounter (Signed)
Patient calling says she is going to bring another copy of the paperwork for Heidi [name] to sign- says she never got it back last time when she dropped it off in May.

## 2023-07-27 NOTE — Telephone Encounter (Signed)
Patient is at the office says she is going to need a note saying she cannot work. Please advise  Thank you

## 2023-07-28 ENCOUNTER — Ambulatory Visit: Payer: Medicaid Other | Admitting: Family Medicine

## 2023-07-28 NOTE — Telephone Encounter (Signed)
Note is in patient chart from 3 weeks ago. Patient notified.

## 2023-07-29 ENCOUNTER — Ambulatory Visit (INDEPENDENT_AMBULATORY_CARE_PROVIDER_SITE_OTHER): Payer: Medicaid Other | Admitting: Family Medicine

## 2023-07-29 ENCOUNTER — Encounter: Payer: Self-pay | Admitting: Family Medicine

## 2023-07-29 VITALS — BP 125/82 | HR 63 | Resp 16 | Ht 63.0 in | Wt 137.0 lb

## 2023-07-29 DIAGNOSIS — Z09 Encounter for follow-up examination after completed treatment for conditions other than malignant neoplasm: Secondary | ICD-10-CM

## 2023-07-29 DIAGNOSIS — I10 Essential (primary) hypertension: Secondary | ICD-10-CM

## 2023-07-29 DIAGNOSIS — F1090 Alcohol use, unspecified, uncomplicated: Secondary | ICD-10-CM | POA: Diagnosis not present

## 2023-07-29 DIAGNOSIS — F10931 Alcohol use, unspecified with withdrawal delirium: Secondary | ICD-10-CM | POA: Diagnosis not present

## 2023-07-29 DIAGNOSIS — R7989 Other specified abnormal findings of blood chemistry: Secondary | ICD-10-CM

## 2023-07-29 DIAGNOSIS — K567 Ileus, unspecified: Secondary | ICD-10-CM

## 2023-07-29 DIAGNOSIS — F17200 Nicotine dependence, unspecified, uncomplicated: Secondary | ICD-10-CM | POA: Diagnosis not present

## 2023-07-29 DIAGNOSIS — S36039D Unspecified laceration of spleen, subsequent encounter: Secondary | ICD-10-CM

## 2023-07-29 NOTE — Assessment & Plan Note (Signed)
P hospitalized in [place], states motivated to stop drinking , has not joined AA group as yet and is followed at [facility], list of AA group meeting places in the area is provided at visit , and she is strongly encouraged to join

## 2023-07-29 NOTE — Patient Instructions (Addendum)
F/U with PCP as scheduled on 08/11/2023  Please start using your clonidine patches  We will give you list of places in [place] with AA meetings  Blood work next week pls cBC and cmp   Discuss mobility equipment with PCP  Neeed to start vit B1  Keep thinking about grand baby, life and 81 th birthday

## 2023-07-29 NOTE — Assessment & Plan Note (Signed)
Asked:confirms currently smokes cigarettes Assess: Unwilling to set a quit date, but is cutting back Advise: needs to QUIT to reduce risk of cancer, cardio and cerebrovascular disease Assist: counseled for 5 minutes and literature provided Arrange: follow up in 2 to 4 months Not currently using nicodrm patches as she is afraid to use them as still smoking

## 2023-07-29 NOTE — Assessment & Plan Note (Signed)
Repeat scan to be done post hospitalization in 6 weeks, will defer  to PCP to order as she is seeing her in 2 weeks

## 2023-07-29 NOTE — Assessment & Plan Note (Signed)
Resolved

## 2023-07-29 NOTE — Assessment & Plan Note (Signed)
2nd hospitalization since 2024 due to alcohol withdrawal

## 2023-07-29 NOTE — Assessment & Plan Note (Signed)
Patient in for follow up of recent hospitalization. Discharge summary, and laboratory and radiology data are reviewed, and any questions or concerns  are discussed. Specific issues requiring follow up are specifically addressed.  

## 2023-07-29 NOTE — Assessment & Plan Note (Signed)
Rept labs ordered and will be forwarded to PCP

## 2023-07-29 NOTE — Progress Notes (Signed)
ODESSIE STIPES     MRN: [id number]      DOB: 05/12/1971  Chief Complaint  Patient presents with   Hospitalization Follow-up    D/c 8/3 alcohol withdrawal and complications. Wants a walker with seat or scooter   H/O recurrent falls , c/o low back pain , uses cane  to ambulate, requesting rolling walker with seat or scooter , will defer to PCP to follow up on this Hospital course reviewed while in office,Ms Bravata is not wearing her catapress patch as she is afraid to smoke and wear I, educated re importance of using it and advised not the same as the nicoderm patch Reports alcohol and drug addiction which she has battled over the years and really wants to stop as she "nearly died "  Needs scan of abdomen in Septemeber will defer ordering as she has an appt  with her PCP in 2 weeks Needs labs  Info to be given re AA meetings After  visit at checkout, she requested a letter stating that she is unable to work so that she is able to get food stamps  and there was urgency in the need which is the main reason she I being seen today. Record review shows that she had recently got a letter from her PCP on 7/5 stating that she is unfit for work. Pt in today following hospitalization from 7/15 to08/02/2023, was hospitalized in 03/2023 also  HPI Ms. Kimbel is here for follow up of recent hopsitalization See HPI  ROS C/o bck pain, requested pain pills "stronger than tramadol" Uses a cane due to pain and is requesting additional mobility equipment  PE  BP 125/82   Pulse 63   Resp 16   Ht 5\' 3"  (1.6 m)   Wt 137 lb (62.1 kg)   SpO2 99%   BMI 24.27 kg/m   Patient alert and oriented and in no cardiopulmonary distress.  HEENT: No facial asymmetry, EOMI,     Neck supple .  Chest: Clear to auscultation bilaterally.  CVS: S1, S2 no murmurs, no S3.Regular rate.  .   Ext: No edema  Psych: Good eye contact, normal affect. Memory intact tearful at times, mildly  anxious not  depressed  appearing.  CNS: CN 2-12 intact, ted.   Assessment & Plan Alcohol use disorder P hospitalized in [place], states motivated to stop drinking , has not joined AA group as yet and is followed at [facility], list of AA group meeting places in the area is provided at visit , and she is strongly encouraged to join  Laceration of spleen Repeat scan to be done post hospitalization in 6 weeks, will defer  to PCP to order as she is seeing her in 2 weeks  Tobacco use disorder Asked:confirms currently smokes cigarettes Assess: Unwilling to set a quit date, but is cutting back Advise: needs to QUIT to reduce risk of cancer, cardio and cerebrovascular disease Assist: counseled for 5 minutes and literature provided Arrange: follow up in 2 to 4 months Not currently using nicodrm patches as she is afraid to use them as still smoking  Alcohol withdrawal (HCC) 2nd hospitalization since 2024 due to alcohol withdrawal  Elevated LFTs Rept labs ordered and will be forwarded to PCP  [facility]) Resolved   Hospital discharge follow-up Patient in for follow up of recent hospitalization. Discharge summary, and laboratory and radiology data are reviewed, and any questions or concerns  are discussed. Specific issues requiring follow up are specifically addressed.

## 2023-08-03 ENCOUNTER — Other Ambulatory Visit: Payer: Self-pay

## 2023-08-03 DIAGNOSIS — F10931 Alcohol use, unspecified with withdrawal delirium: Secondary | ICD-10-CM

## 2023-08-03 NOTE — Progress Notes (Unsigned)
Referring Provider: Wylene Men* Primary Care Physician:  Rica Records, FNP Primary GI Physician: Dr. Marletta Lor  Chief Complaint  Patient presents with   Hospitalization Follow-up    HPI:   Heidi [name] is a 52 y.o. female with a history of HTN and alcohol abuse, presenting today for hospital follow-up of dilated small bowel and colon likely related to ileus.  Patient presented to the ER 7/15 with N/V, body aches,  found to be mildly febrile at 100.2, also with hypokalemia, mildly elevated LFTs and bilirubin.  RUQ ultrasound with hepatic steatosis.  She was admitted on CIWA protocol.  Ultimately developed severe alcohol withdrawal, DTs, requiring Precedex.  CT A/P without contrast 7/22 with multiple dilated and fluid-filled loops of the mid to distal small bowel and colon with relative decompression of sigmoid colon and rectum.  She required NG tube.  Flex sig completed 7/23 for decompression noting erythema in transverse colon, dilated descending and transverse colon s/p fluid aspiration. Stool in rectum, sigmoid, descending, and transverse.  Due to extended timeframe with n.p.o. status, started on TPN.  Ultimately, with supportive measures, bowel regimen including bisacodyl suppository, MiraLAX, Senokot, and mag citrate, her bowels finally began to move, diet was able to be advanced, and NG tube was removed.  An additional concern while inpatient was poorly defined low-attenuating lesion involving majority of the spleen noted on CT 7/28 concerning for possible splenic laceration versus abscess versus infarct.  She had already been on broad-spectrum antibiotics and this was continued due to leukocytosis and fevers, but ultimately discontinued after 5 days.  She had repeat CT 8/1 with stable appearance of splenic lesion most likely splenic laceration or infarct. No further intervention recommended.   She has also had elevated LFTs. LFTs normalized during recent  hospitalization, but bumped again during admission on 7/31 with no follow-up labs thereafter.   Today:  Having a BM daily. Stools are "normal". No abdominal pain.  Not taking MiraLAX right now because her bowels are moving well.  States she was given a prescription for this, but does not want to take it right now.   No brbpr or melena. No vaginal bleeding, on depo shot.   Feels full all the time. Eats a couple of bites and is full. No nausea or vomiting. No heartburn or dysphagia. No prior EGD or complete colonoscopy. Weight is fairly stable since hospital discharge.  Takes goody powders for back pain. 6 per day. Also taking tylenol, 650 mg, taking 6 per day. States she has to take something because she is not going to be in pain.   Has been on iron for about 1 year.  States she was on it years ago, but discontinued for a while.  She has not abstinent from alcohol since her hospital discharge on 8/3.   Past Medical History:  Diagnosis Date   Alcohol withdrawal (HCC)    Cervical radiculopathy    Malignant hyperthermia     Past Surgical History:  Procedure Laterality Date   FLEXIBLE SIGMOIDOSCOPY N/A 07/13/2023   Procedure: FLEXIBLE SIGMOIDOSCOPY;  Surgeon: Dolores Frame, MD;  [facility];  Service: Gastroenterology;  Laterality: N/A;    Current Outpatient Medications  Medication Sig Dispense Refill   citalopram (CELEXA) 20 MG tablet Take 20 mg by mouth daily.     cloNIDine (CATAPRES - DOSED IN MG/24 HR) 0.2 mg/24hr patch Place 1 patch (0.2 mg total) onto the skin once a week. 4 patch 4  diltiazem (CARDIZEM CD) 120 MG 24 hr capsule Take 1 capsule (120 mg total) by mouth daily. 30 capsule 1   ferrous sulfate 325 (65 FE) MG tablet Take 325 mg by mouth daily with breakfast.     folic acid (FOLVITE) 1 MG tablet Take 1 tablet (1 mg total) by mouth daily. 30 tablet 3   metoprolol tartrate (LOPRESSOR) 25 MG tablet Take 0.5 tablets (12.5 mg total) by mouth 2 (two)  times daily. 60 tablet 2   pantoprazole (PROTONIX) 40 MG tablet Take 1 tablet (40 mg total) by mouth daily before breakfast. 30 tablet 3   traZODone (DESYREL) 150 MG tablet Take 150 mg by mouth at bedtime.     No current facility-administered medications for this visit.    Allergies as of 08/05/2023   (No Known Allergies)    Family History  Problem Relation Age of Onset   Stroke Father     Social History   Socioeconomic History   Marital status: Widowed    Spouse name: Not on file   Number of children: Not on file   Years of education: Not on file   Highest education level: Not on file  Occupational History   Not on file  Tobacco Use   Smoking status: Every Day    Current packs/day: 0.50    Average packs/day: 0.5 packs/day for 37.6 years (18.8 ttl pk-yrs)    Types: Cigarettes    Start date: 47   Smokeless tobacco: Never  Vaping Use   Vaping status: Never Used  Substance and Sexual Activity   Alcohol use: Not Currently    Alcohol/week: 42.0 standard drinks of alcohol    Types: 42 Cans of beer per week    Comment: 6 pack of beer daily. No alcohol since 07/05/23.   Drug use: No   Sexual activity: Yes  Other Topics Concern   Not on file  Social History Narrative   Not on file   Social Determinants of Health   Financial Resource Strain: Not on file  Food Insecurity: No Food Insecurity (07/05/2023)   Hunger Vital Sign    Worried About Running Out of Food in the Last Year: Never true    Ran Out of Food in the Last Year: Never true  Transportation Needs: No Transportation Needs (07/05/2023)   PRAPARE - Administrator, Civil Service (Medical): No    Lack of Transportation (Non-Medical): No  Physical Activity: Not on file  Stress: Not on file  Social Connections: Not on file    Review of Systems: Gen: Denies fever, chills, cold or flulike symptoms, presyncope, syncope. CV: Denies chest pain, palpitations. Resp: Denies dyspnea, cough. GI: See  HPI Heme: See HPI  Physical Exam: BP 106/76 (BP Location: Right Arm, Patient Position: Sitting, Cuff Size: Normal)   Pulse 95   Temp 98.2 F (36.8 C) (Oral)   Ht 5\' 3"  (1.6 m)   Wt 135 lb 3.2 oz (61.3 kg)   LMP  (LMP Unknown)   SpO2 98%   BMI 23.95 kg/m  General:   Alert and oriented. No distress noted. Pleasant and cooperative.  Head:  Normocephalic and atraumatic. Eyes:  Conjuctiva clear without scleral icterus. Heart:  S1, S2 present without murmurs appreciated. Lungs:  Clear to auscultation bilaterally. No wheezes, rales, or rhonchi. No distress.  Abdomen:  +BS, soft, non-tender and non-distended. No rebound or guarding. No HSM or masses noted. Msk:  Symmetrical without gross deformities. Normal posture. Extremities:  Without edema.  Neurologic:  Alert and  oriented x4 Psych:  Normal mood and affect.    Assessment:  52 y.o. female with a history of HTN and alcohol abuse, presenting today for hospital follow-up of ileus, possible splenic laceration noted on CT. Also discussed peritoneal nodularity noted on CT, anemia, early satiety, elevated LFTs.    Ileus: Developed ileus during recent hospitalization for alcohol withdrawal, developing severe Dts and sedated with Precedex. Underwent flex sig for decompression, required NG tube, but ultimately with bowel regimen of bisacodyl suppositories, MiraLAX, Senokot, magnesium citrate, her bowels finally began to move and diet was advanced.  She is doing well at this time with normal bowel movements.  She was advised take MiraLAX daily, but has opted not to take MiraLAX as her bowels are moving well on a daily basis.  Splenic lesion: Possible splenic laceration or infarct noted on CT following flexible sigmoidoscopy.  Flexible sigmoidoscopy was completed 7/23.  CT on 7/28 with possible splenic laceration versus abscess versus infarct.  Follow-up CT 8/1 with stable appearance of splenic lesion most likely splenic laceration or infarct,  infection or neoplasm less likely.  No evidence of perisplenic hematoma or hemoperitoneum.  Clinically, the patient is doing well without any abdominal pain.  Discussed case with Dr. Marletta Lor.  Will plan for surveillance CT in 3 months.  Abnormal CT abdomen/peritoneal nodularity: CT A/P without contrast 07/12/2023 with moderate amount of low-density ascites with soft tissue stranding throughout the mesenteric fat and possible peritoneal nodularity in the right mid abdomen.  No evidence of bowel perforation.  Radiologist stated cannot exclude peritoneal carcinomatosis.  Notably, follow-up CT A/P with contrast on 7/28 and 8/1 did not redemonstrate any of these findings.  Discussed with Dr. Marletta Lor.  Will plan to repeat CT in 3 months for surveillance.  Anemia: Hemoglobin declined from 12 range to as low as 8.5 during recent hospitalization in July.  Last hemoglobin on file was 8.9 on 7/31.  Does not appear that patient had any overt bleeding during admission.  She denies any overt GI bleeding since hospital discharge.  No vaginal bleeding on Depo.  She is taking iron daily and reports she has been taking this for about 1 year now and actually used to take iron years ago.  She has never had a complete colonoscopy or an upper endoscopy and is not interested in pursuing either of these at this time.  I will repeat CBC and also check an iron panel.  Early satiety: New onset since hospital discharge.  Weight has remained fairly stable.  Denies nausea, vomiting, heartburn symptoms, abdominal pain.  Offered EGD for further evaluation, but patient states she will think about this.  Notably, she is taking a significant amount of Goody powders on a daily basis and is at risk for peptic ulcer disease, gastritis, duodenitis, esophagitis.  She was advised to discontinue BC powders and avoid NSAIDs, but it is unlikely that patient will avoid these completely as she has ongoing back pain.  I am going to start her on a PPI for  GI protection.  Elevated LFTs: Likely secondary to chronic alcohol abuse.  LFTs did normalize during recent hospitalization, but became mildly elevated again near the end of admission on 7/31 with AST 73, ALT 73.  She has remained abstinent from alcohol since hospital discharge, but reports taking significant amounts of Tylenol daily (6, 650 mg tablets per day).  At this point, despite absence from alcohol, significant Tylenol use may cause persistent LFT elevation.  She  was counseled on the importance of limiting Tylenol.  I will recheck LFTs at this time.  If persistent elevation, will at least evaluate for viral hepatitis.  Notably, recent ultrasound on file in July with hepatic steatosis.   Plan:  CBC, HFP, iron panel Start pantoprazole 40 mg daily. Start MiraLAX 17 g daily if bowels do not continue to move well every day. Continue to abstain from alcohol. Avoid NSAIDs. No more than 2000-3000 mg of Tylenol per 24 hours. Recommended for patient to discuss her chronic back pain with PCP for other management options aside from NSAIDs and Tylenol. Plan for repeat CT A/P with contrast in 3 months. The patient will let me know if she would like to pursue EGD and colonoscopy. Follow-up in 3 months or sooner if needed.   Heidi Memos, PA-C [facility] Gastroenter[facility] 08/05/2023

## 2023-08-05 ENCOUNTER — Encounter: Payer: Self-pay | Admitting: Gastroenterology

## 2023-08-05 ENCOUNTER — Ambulatory Visit (INDEPENDENT_AMBULATORY_CARE_PROVIDER_SITE_OTHER): Payer: Medicaid Other | Admitting: Gastroenterology

## 2023-08-05 VITALS — BP 106/76 | HR 95 | Temp 98.2°F | Ht 63.0 in | Wt 135.2 lb

## 2023-08-05 DIAGNOSIS — R935 Abnormal findings on diagnostic imaging of other abdominal regions, including retroperitoneum: Secondary | ICD-10-CM

## 2023-08-05 DIAGNOSIS — R6881 Early satiety: Secondary | ICD-10-CM | POA: Diagnosis not present

## 2023-08-05 DIAGNOSIS — R7989 Other specified abnormal findings of blood chemistry: Secondary | ICD-10-CM | POA: Diagnosis not present

## 2023-08-05 DIAGNOSIS — D649 Anemia, unspecified: Secondary | ICD-10-CM

## 2023-08-05 DIAGNOSIS — Z8719 Personal history of other diseases of the digestive system: Secondary | ICD-10-CM

## 2023-08-05 DIAGNOSIS — D7389 Other diseases of spleen: Secondary | ICD-10-CM

## 2023-08-05 MED ORDER — PANTOPRAZOLE SODIUM 40 MG PO TBEC
40.0000 mg | DELAYED_RELEASE_TABLET | Freq: Every day | ORAL | 3 refills | Status: DC
Start: 2023-08-05 — End: 2023-11-30

## 2023-08-05 NOTE — Patient Instructions (Addendum)
Please have blood work completed at [facility].  You need an upper endoscopy and a colonoscopy. Please let me know when you are ready to schedule.   Continue to monitor your bowel function.  If you start to notice that you are not having good, productive bowel movements every day, please start taking MiraLAX 1 capful (17 g) daily in 8 ounces of water or other noncarbonated beverage of your choice.  Continue to avoid all alcohol.  As we discussed, you are taking too much Tylenol and Goody powders.  You should not take more than 2000-3000 mg of Tylenol per 24 hours.  You need to avoid Goody powders altogether and avoid all NSAID products including ibuprofen, Aleve, Advil, BC powders, Goody powders, anything that says "NSAID" the package as these medications can cause inflammation throughout your GI tract.  I would like for you to go ahead and start pantoprazole 40 mg daily to protect your stomach in light of all the Goody powders you have been taking.  Please discuss your back pain with your primary care doctor.  Due to the spleen abnormalities noted on the CT scan in the hospital, we will plan to repeat a CT scan in 3 months for monitoring.  I will plan to see you back in the office in 3 months or sooner if needed.  If you decide that you would like to proceed with an upper endoscopy and colonoscopy prior to that, please let me know and we can get this scheduled.  It was good to see you today!  Ermalinda Memos, PA-C [facility]astroenter[facility]

## 2023-08-07 LAB — IRON,TIBC AND FERRITIN PANEL
%SAT: 22 % (ref 16–45)
Ferritin: 348 ng/mL — ABNORMAL HIGH (ref 16–232)
Iron: 58 ug/dL (ref 40–190)
TIBC: 260 mcg/dL (calc) (ref 250–450)

## 2023-08-07 LAB — CBC WITH DIFFERENTIAL/PLATELET
Absolute Monocytes: 380 {cells}/uL (ref 200–950)
Basophils Absolute: 39 {cells}/uL (ref 0–200)
Basophils Relative: 0.7 %
Eosinophils Absolute: 88 {cells}/uL (ref 15–500)
Eosinophils Relative: 1.6 %
HCT: 34.8 % — ABNORMAL LOW (ref 35.0–45.0)
Hemoglobin: 11.4 g/dL — ABNORMAL LOW (ref 11.7–15.5)
Lymphs Abs: 1397 {cells}/uL (ref 850–3900)
MCH: 29.8 pg (ref 27.0–33.0)
MCHC: 32.8 g/dL (ref 32.0–36.0)
MCV: 90.9 fL (ref 80.0–100.0)
MPV: 10.1 fL (ref 7.5–12.5)
Monocytes Relative: 6.9 %
Neutro Abs: 3597 {cells}/uL (ref 1500–7800)
Neutrophils Relative %: 65.4 %
Platelets: 443 10*3/uL — ABNORMAL HIGH (ref 140–400)
RBC: 3.83 10*6/uL (ref 3.80–5.10)
RDW: 14.3 % (ref 11.0–15.0)
Total Lymphocyte: 25.4 %
WBC: 5.5 10*3/uL (ref 3.8–10.8)

## 2023-08-07 LAB — HEPATIC FUNCTION PANEL
AG Ratio: 1.2 (calc) (ref 1.0–2.5)
ALT: 9 U/L (ref 6–29)
AST: 13 U/L (ref 10–35)
Albumin: 4.1 g/dL (ref 3.6–5.1)
Alkaline phosphatase (APISO): 94 U/L (ref 31–125)
Bilirubin, Direct: 0.1 mg/dL (ref 0.0–0.2)
Globulin: 3.3 g/dL (ref 1.9–3.7)
Indirect Bilirubin: 0.3 mg/dL (ref 0.2–1.2)
Total Bilirubin: 0.4 mg/dL (ref 0.2–1.2)
Total Protein: 7.4 g/dL (ref 6.1–8.1)

## 2023-08-11 ENCOUNTER — Telehealth: Payer: Self-pay | Admitting: Family Medicine

## 2023-08-11 ENCOUNTER — Ambulatory Visit (INDEPENDENT_AMBULATORY_CARE_PROVIDER_SITE_OTHER): Payer: Medicaid Other | Admitting: Family Medicine

## 2023-08-11 VITALS — BP 126/87 | HR 93 | Ht 63.0 in | Wt 137.0 lb

## 2023-08-11 DIAGNOSIS — M5136 Other intervertebral disc degeneration, lumbar region: Secondary | ICD-10-CM | POA: Diagnosis not present

## 2023-08-11 DIAGNOSIS — M545 Low back pain, unspecified: Secondary | ICD-10-CM

## 2023-08-11 DIAGNOSIS — F1090 Alcohol use, unspecified, uncomplicated: Secondary | ICD-10-CM

## 2023-08-11 DIAGNOSIS — F1010 Alcohol abuse, uncomplicated: Secondary | ICD-10-CM

## 2023-08-11 DIAGNOSIS — G8929 Other chronic pain: Secondary | ICD-10-CM

## 2023-08-11 DIAGNOSIS — Z993 Dependence on wheelchair: Secondary | ICD-10-CM

## 2023-08-11 MED ORDER — TIZANIDINE HCL 4 MG PO CAPS: 4.0000 mg | ORAL_CAPSULE | Freq: Two times a day (BID) | ORAL | 2 refills | Status: DC | PRN

## 2023-08-11 MED ORDER — LIDOCAINE 5 % EX PTCH: 1.0000 | MEDICATED_PATCH | CUTANEOUS | 0 refills | Status: DC

## 2023-08-11 MED ORDER — CLONIDINE 0.2 MG/24HR TD PTWK: 0.2000 mg | MEDICATED_PATCH | TRANSDERMAL | 4 refills | Status: DC

## 2023-08-11 NOTE — Assessment & Plan Note (Signed)
Lidocaine 5% patch and Zanaflex Advise to follow up with Orthopedics and Physical therapy Discussed medication desired effects, potential side effects. Non pharmacological interventions include rest, avoid twisting, improper bending, straining lower back. Demonstration of proper body mechanics. Alternate ice and heat. Recommend stretching back and legs.

## 2023-08-11 NOTE — Progress Notes (Signed)
Patient Office Visit   Subjective   Patient ID: Heidi [name], female    DOB: 24-Jul-1971  Age: 52 y.o. MRN: [id number]  CC:  Chief Complaint  Patient presents with   Follow-up    ED release follow up for Alcohol withdrawal, Pt. Would like referral to a new home care agency, as well as order for new electric DPME equipment.    Hypertension    F/u Hypertension would like medication refills   Gastroesophageal Reflux    F/u Genella Rife. Would like to request medication refills.    Back Pain    Would like to request medication for lower back pain as well as rt. Shoulder pain.     HPI Heidi [name] 52 year old female, presents to the clinic for chronic lumbar pain and ED follow up for alcohol withdraw. She  has a past medical history of Alcohol withdrawal (HCC), Cervical radiculopathy, and Malignant hyperthermia.  Back Pain This is a recurrent problem. The problem occurs constantly. The problem has been gradually worsening since onset. The pain is present in the lumbar spine. The quality of the pain is described as aching. The pain does not radiate. The pain is at a severity of 8/10. The pain is Worse during the day. The symptoms are aggravated by lying down and bending. Stiffness is present All day. Associated symptoms include numbness, tingling and weakness. Pertinent negatives include no bladder incontinence, bowel incontinence or leg pain. Risk factors include poor posture and sedentary lifestyle. She has tried analgesics and bed rest for the symptoms. The treatment provided no relief.  Drug / Alcohol Assessment The patient's primary symptoms include weakness. This is a chronic problem. The problem has been waxing and waning since onset. Suspected agents include alcohol. Associated symptoms include nausea. Pertinent negatives include no bladder incontinence or bowel incontinence. Past treatments include Alcoholics Anonymous and outpatient rehab. The treatment provided no relief. Her past medical  history is significant for addiction treatment, a chronic illness and a mental illness.      Outpatient Encounter Medications as of 08/11/2023  Medication Sig   citalopram (CELEXA) 20 MG tablet Take 20 mg by mouth daily.   diltiazem (CARDIZEM CD) 120 MG 24 hr capsule Take 1 capsule (120 mg total) by mouth daily.   ferrous sulfate 325 (65 FE) MG tablet Take 325 mg by mouth daily with breakfast.   folic acid (FOLVITE) 1 MG tablet Take 1 tablet (1 mg total) by mouth daily.   lidocaine (LIDODERM) 5 % Place 1 patch onto the skin daily. Remove & Discard patch within 12 hours or as directed by MD   metoprolol tartrate (LOPRESSOR) 25 MG tablet Take 0.5 tablets (12.5 mg total) by mouth 2 (two) times daily.   pantoprazole (PROTONIX) 40 MG tablet Take 1 tablet (40 mg total) by mouth daily before breakfast.   traZODone (DESYREL) 150 MG tablet Take 150 mg by mouth at bedtime.   [DISCONTINUED] cloNIDine (CATAPRES - DOSED IN MG/24 HR) 0.2 mg/24hr patch Place 1 patch (0.2 mg total) onto the skin once a week.   [DISCONTINUED] tiZANidine (ZANAFLEX) 4 MG capsule Take 1 capsule (4 mg total) by mouth 2 (two) times daily as needed for muscle spasms.   cloNIDine (CATAPRES - DOSED IN MG/24 HR) 0.2 mg/24hr patch Place 1 patch (0.2 mg total) onto the skin once a week.   tiZANidine (ZANAFLEX) 4 MG capsule Take 1 capsule (4 mg total) by mouth 2 (two) times daily as needed for muscle spasms.  No facility-administered encounter medications on file as of 08/11/2023.    Past Surgical History:  Procedure Laterality Date   FLEXIBLE SIGMOIDOSCOPY N/A 07/13/2023   Procedure: FLEXIBLE SIGMOIDOSCOPY;  Surgeon: Dolores Frame, MD;  [facility];  Service: Gastroenterology;  Laterality: N/A;    Review of Systems  Gastrointestinal:  Positive for nausea. Negative for bowel incontinence.  Genitourinary:  Negative for bladder incontinence.  Musculoskeletal:  Positive for back pain.  Neurological:  Positive  for tingling, weakness and numbness.      Objective    BP 126/87 (BP Location: Right Arm, Patient Position: Sitting, Cuff Size: Normal)   Pulse 93   Ht 5\' 3"  (1.6 m)   Wt 137 lb 0.6 oz (62.2 kg)   LMP  (LMP Unknown)   SpO2 96%   BMI 24.28 kg/m   Physical Exam Vitals reviewed.  Constitutional:      General: She is not in acute distress.    Appearance: Normal appearance. She is not ill-appearing, toxic-appearing or diaphoretic.  HENT:     Head: Normocephalic.  Eyes:     General:        Right eye: No discharge.        Left eye: No discharge.     Conjunctiva/sclera: Conjunctivae normal.  Cardiovascular:     Rate and Rhythm: Normal rate.     Pulses: Normal pulses.     Heart sounds: Normal heart sounds.  Pulmonary:     Effort: Pulmonary effort is normal. No respiratory distress.     Breath sounds: Normal breath sounds.  Musculoskeletal:        General: Normal range of motion.     Cervical back: Normal range of motion.  Skin:    General: Skin is warm and dry.     Capillary Refill: Capillary refill takes less than 2 seconds.  Neurological:     General: No focal deficit present.     Mental Status: She is alert and oriented to person, place, and time.     Coordination: Coordination abnormal.     Gait: Gait abnormal.  Psychiatric:        Mood and Affect: Mood normal.        Behavior: Behavior normal.       Assessment & Plan:  Alcohol abuse -     CBC with Differential/Platelet -     CMP14+EGFR  Degenerative disc disease, lumbar -     Lidocaine; Place 1 patch onto the skin daily. Remove & Discard patch within 12 hours or as directed by MD  Dispense: 30 patch; Refill: 0  Chronic bilateral low back pain without sciatica Assessment & Plan: Lidocaine 5% patch and Zanaflex Advise to follow up with Orthopedics and Physical therapy Discussed medication desired effects, potential side effects. Non pharmacological interventions include rest, avoid twisting, improper bending,  straining lower back. Demonstration of proper body mechanics. Alternate ice and heat. Recommend stretching back and legs.   Orders: -     Ambulatory referral to Pain Clinic  Uses powered wheelchair -     Ambulatory referral to Occupational Therapy  Alcohol use disorder Assessment & Plan: Labs ordered BMP , CBC to monitor trends. Patient follows up with [facility] for addiction treatment Given other resources to contact Sherre Lain for additional addition treatment patient agreed.  The patient stated wanting to quit drinking Will assess patient liver enzymes  before starting naltrexone 50 mg daily to help with cravings for alcohol use Resources provided for AA classes Advise  avoid purchasing alcohol, and remove alcohol from home     Other orders -     cloNIDine; Place 1 patch (0.2 mg total) onto the skin once a week.  Dispense: 4 patch; Refill: 4 -     tiZANidine HCl; Take 1 capsule (4 mg total) by mouth 2 (two) times daily as needed for muscle spasms.  Dispense: 30 capsule; Refill: 2    Return in about 3 months (around 11/11/2023), or Cancel 8/28 appointment, for chronic follow-up.   Cruzita Lederer Newman Nip, FNP

## 2023-08-11 NOTE — Assessment & Plan Note (Signed)
Labs ordered BMP , CBC to monitor trends. Patient follows up with [facility] for addiction treatment Given other resources to contact Sherre Lain for additional addition treatment patient agreed.  The patient stated wanting to quit drinking Will assess patient liver enzymes  before starting naltrexone 50 mg daily to help with cravings for alcohol use Resources provided for AA classes Advise avoid purchasing alcohol, and remove alcohol from home

## 2023-08-11 NOTE — Patient Instructions (Addendum)
Please follow up with:  Sherre Lain MD Treatment [facility] [place]  Address: [address] [place], [place] [address]  Phone: [phone]   Physical therapy has been ordered for you at [facility]. They should call you to schedule, [phone] is the phone number to call, if you want to call to schedule.             Great to see you today.  I have refilled the medication(s) we provide.    If labs were collected, we will inform you of lab results once received either by echart message or telephone call.   - echart message- for normal results that have been seen by the patient already.   - telephone call: abnormal results or if patient has not viewed results in their echart.   - Please take medications as prescribed. - Follow up with your primary health provider if any health concerns arises. - If symptoms worsen please contact your primary care provider and/or visit the emergency department.

## 2023-08-12 ENCOUNTER — Telehealth: Payer: Self-pay | Admitting: Family Medicine

## 2023-08-12 LAB — CBC WITH DIFFERENTIAL/PLATELET
Basophils Absolute: 0.1 10*3/uL (ref 0.0–0.2)
Basos: 1 %
EOS (ABSOLUTE): 0.1 10*3/uL (ref 0.0–0.4)
Eos: 2 %
Hematocrit: 35.5 % (ref 34.0–46.6)
Hemoglobin: 11.6 g/dL (ref 11.1–15.9)
Immature Grans (Abs): 0 10*3/uL (ref 0.0–0.1)
Immature Granulocytes: 0 %
Lymphocytes Absolute: 1.7 10*3/uL (ref 0.7–3.1)
Lymphs: 26 %
MCH: 29.4 pg (ref 26.6–33.0)
MCHC: 32.7 g/dL (ref 31.5–35.7)
MCV: 90 fL (ref 79–97)
Monocytes Absolute: 0.6 10*3/uL (ref 0.1–0.9)
Monocytes: 9 %
Neutrophils Absolute: 4 10*3/uL (ref 1.4–7.0)
Neutrophils: 62 %
Platelets: 400 10*3/uL (ref 150–450)
RBC: 3.94 x10E6/uL (ref 3.77–5.28)
RDW: 14.7 % (ref 11.7–15.4)
WBC: 6.5 10*3/uL (ref 3.4–10.8)

## 2023-08-12 LAB — CMP14+EGFR
ALT: 11 IU/L (ref 0–32)
AST: 22 IU/L (ref 0–40)
Albumin: 4.1 g/dL (ref 3.9–4.9)
Alkaline Phosphatase: 97 IU/L (ref 44–121)
BUN/Creatinine Ratio: 15 (ref 9–23)
BUN: 9 mg/dL (ref 6–24)
Bilirubin Total: 0.2 mg/dL (ref 0.0–1.2)
CO2: 22 mmol/L (ref 20–29)
Calcium: 10.1 mg/dL (ref 8.7–10.2)
Chloride: 106 mmol/L (ref 96–106)
Creatinine, Ser: 0.59 mg/dL (ref 0.57–1.00)
Globulin, Total: 2.7 g/dL (ref 1.5–4.5)
Glucose: 70 mg/dL (ref 70–99)
Potassium: 4.4 mmol/L (ref 3.5–5.2)
Sodium: 141 mmol/L (ref 134–144)
Total Protein: 6.8 g/dL (ref 6.0–8.5)
eGFR: 110 mL/min/{1.73_m2} (ref >59)

## 2023-08-12 NOTE — Telephone Encounter (Signed)
Pt LVM says she is unable to afford lidocaine (LIDODERM) 5 % [[id number]] requesting an alternative. Please advise Thank you

## 2023-08-15 ENCOUNTER — Other Ambulatory Visit: Payer: Self-pay | Admitting: Family Medicine

## 2023-08-16 ENCOUNTER — Telehealth: Payer: Self-pay | Admitting: Family Medicine

## 2023-08-16 NOTE — Telephone Encounter (Signed)
Pt came by office in regard cloNIDine (CATAPRES - DOSED IN MG/24 HR) 0.2 mg/24hr patch    Patients states that patches are to expensive  Wants to see if she can try cheaper alternative will even consider oral medication as well.  Wants a call back.

## 2023-08-17 ENCOUNTER — Other Ambulatory Visit: Payer: Self-pay | Admitting: Family Medicine

## 2023-08-17 MED ORDER — CLONIDINE HCL 0.1 MG PO TABS: 0.1000 mg | ORAL_TABLET | Freq: Every day | ORAL | 2 refills | Status: DC

## 2023-08-17 NOTE — Telephone Encounter (Signed)
I sent in the oral tablet form

## 2023-08-17 NOTE — Telephone Encounter (Signed)
Please advise 

## 2023-08-18 ENCOUNTER — Ambulatory Visit: Payer: Medicaid Other | Admitting: Family Medicine

## 2023-08-19 ENCOUNTER — Ambulatory Visit (HOSPITAL_COMMUNITY): Payer: Medicaid Other | Attending: Family Medicine | Admitting: Occupational Therapy

## 2023-08-19 DIAGNOSIS — Z993 Dependence on wheelchair: Secondary | ICD-10-CM | POA: Insufficient documentation

## 2023-08-19 DIAGNOSIS — M6281 Muscle weakness (generalized): Secondary | ICD-10-CM | POA: Insufficient documentation

## 2023-08-19 NOTE — Telephone Encounter (Signed)
Pt aware.

## 2023-08-19 NOTE — Therapy (Signed)
[facility] Outpatient Rehabilitation at [facility] [address] [place], [place], [address] Phone: [phone]   Fax:  [phone]  Occupational Therapy Evaluation  Patient Details  Name: Heidi [name] MRN: [id number] Date of Birth: February 12, 1971 No data recorded  Encounter Date: 08/19/2023   OT End of Session - 08/19/23 0851     Visit Number 1    Number of Visits 1    Date for OT Re-Evaluation 08/19/23    OT Start Time 0819    OT Stop Time 0824    OT Time Calculation (min) 5 min    Activity Tolerance Patient tolerated treatment well    Behavior During Therapy Agitated             Past Medical History:  Diagnosis Date   Alcohol withdrawal (HCC)    Cervical radiculopathy    Malignant hyperthermia     Past Surgical History:  Procedure Laterality Date   FLEXIBLE SIGMOIDOSCOPY N/A 07/13/2023   Procedure: FLEXIBLE SIGMOIDOSCOPY;  Surgeon: Dolores Frame, MD;  Location: [facility];  Service: Gastroenterology;  Laterality: N/A;    There were no vitals filed for this visit.   Visit Diagnosis: Muscle weakness (generalized)    Problem List Patient Active Problem List   Diagnosis Date Noted   Degenerative disc disease, lumbar 08/11/2023   Hospital discharge follow-up 07/29/2023   Laceration of spleen 07/24/2023   Ileus (HCC) 07/13/2023   Elevated LFTs 07/12/2023   Colonic obstruction (HCC) 07/12/2023   Hypothermia 07/09/2023   Hypokalemia 07/06/2023   Thrombocytopenia (HCC) 07/06/2023   Chronic back pain 05/18/2023   Bilateral knee pain 04/26/2023   Depression, recurrent (HCC) 04/26/2023   Alcohol use disorder 04/20/2023   Carpal tunnel syndrome of right wrist 04/20/2023   Tachycardia 04/20/2023   Transaminitis 04/06/2023   GERD (gastroesophageal reflux disease) 04/06/2023   Tobacco use disorder 04/06/2023   Alcohol withdrawal (HCC) 10/19/2022   Essential hypertension 10/19/2022   Nausea & vomiting 10/19/2022   Hypomagnesemia  10/19/2022   CERVICAL RADICULOPATHY 10/29/2010    Pt arrived for Power Wheelchair Evaluation, ambulating with a small base quad cane. Pt ambulated 40+ft with independence and minimal difficulty. OT then advised pt that they could complete the Assessment, however due to her ability to safely ambulate, pt would not qualify for a power wheelchair or manual wheelchair at this time. OT provided education on local shops that supply DME for affordable rates. Pt elected to not complete the assessment and left the clinic.    Trish Mage, OTR/L [facility] [phone] [place], OT 08/19/2023, 8:52 AM  [facility] Outpatient Rehabilitation at [facility] [address] [place], [place], [address] Phone: [phone]   Fax:  [phone]  Name: Heidi [name] MRN: [id number] Date of Birth: 1971-07-19

## 2023-09-03 ENCOUNTER — Encounter: Payer: Self-pay | Admitting: Orthopedic Surgery

## 2023-09-03 ENCOUNTER — Ambulatory Visit: Payer: Medicaid Other | Admitting: Orthopedic Surgery

## 2023-09-03 VITALS — Ht 63.0 in | Wt 147.0 lb

## 2023-09-03 DIAGNOSIS — M5136 Other intervertebral disc degeneration, lumbar region: Secondary | ICD-10-CM | POA: Diagnosis not present

## 2023-09-03 DIAGNOSIS — G8929 Other chronic pain: Secondary | ICD-10-CM | POA: Diagnosis not present

## 2023-09-03 DIAGNOSIS — M545 Low back pain, unspecified: Secondary | ICD-10-CM | POA: Diagnosis not present

## 2023-09-03 DIAGNOSIS — M4126 Other idiopathic scoliosis, lumbar region: Secondary | ICD-10-CM

## 2023-09-03 MED ORDER — INDOMETHACIN 25 MG PO CAPS
25.0000 mg | ORAL_CAPSULE | Freq: Two times a day (BID) | ORAL | 0 refills | Status: DC
Start: 2023-09-03 — End: 2023-12-18

## 2023-09-03 NOTE — Patient Instructions (Addendum)
Call central scheduling [phone] schedule appointment
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
## 2023-09-03 NOTE — Addendum Note (Signed)
Addended by: Michaele Offer on: 09/03/2023 10:46 AM   Modules accepted: Orders

## 2023-09-08 ENCOUNTER — Telehealth: Payer: Self-pay | Admitting: Gastroenterology

## 2023-09-08 NOTE — Telephone Encounter (Signed)
Not sure why patient needs colonoscopy, she had flex sig while inpatient, we will need referral for a TCS and if she's having issues she will need OV.

## 2023-09-08 NOTE — Telephone Encounter (Signed)
Patient came into the office asking to be scheduled for a colonoscopy.  Can you send her a letter?

## 2023-09-09 ENCOUNTER — Telehealth: Payer: Self-pay | Admitting: Orthopedic Surgery

## 2023-09-09 ENCOUNTER — Other Ambulatory Visit: Payer: Self-pay | Admitting: Orthopedic Surgery

## 2023-09-09 MED ORDER — DIAZEPAM 5 MG PO TABS: 5.0000 mg | ORAL_TABLET | Freq: Once | ORAL | 0 refills | Status: AC

## 2023-09-09 NOTE — Progress Notes (Signed)
Requested Prescriptions   Signed Prescriptions Disp Refills   diazepam (VALIUM) 5 MG tablet 1 tablet 0    Sig: Take 1 tablet (5 mg total) by mouth once for 1 dose.

## 2023-09-09 NOTE — Telephone Encounter (Signed)
Dr. Mort Sawyers pt - pt has a MRI scheduled 9/24, she is claustrophobic and would like something to relax her for the MRI.  She uses [employer].

## 2023-09-13 ENCOUNTER — Encounter (HOSPITAL_COMMUNITY): Payer: Self-pay

## 2023-09-13 ENCOUNTER — Other Ambulatory Visit: Payer: Self-pay

## 2023-09-13 ENCOUNTER — Ambulatory Visit (HOSPITAL_COMMUNITY): Payer: Medicaid Other | Attending: Family Medicine

## 2023-09-13 DIAGNOSIS — G8929 Other chronic pain: Secondary | ICD-10-CM | POA: Insufficient documentation

## 2023-09-13 DIAGNOSIS — M4126 Other idiopathic scoliosis, lumbar region: Secondary | ICD-10-CM | POA: Insufficient documentation

## 2023-09-13 DIAGNOSIS — M545 Low back pain, unspecified: Secondary | ICD-10-CM | POA: Diagnosis present

## 2023-09-13 DIAGNOSIS — M5136 Other intervertebral disc degeneration, lumbar region: Secondary | ICD-10-CM | POA: Insufficient documentation
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
## 2023-09-14 ENCOUNTER — Ambulatory Visit (HOSPITAL_COMMUNITY): Payer: Medicaid Other

## 2023-09-14 ENCOUNTER — Encounter (HOSPITAL_COMMUNITY): Payer: Self-pay | Admitting: Gastroenterology

## 2023-09-14 ENCOUNTER — Other Ambulatory Visit: Payer: Self-pay | Admitting: Orthopedic Surgery

## 2023-09-14 ENCOUNTER — Telehealth: Payer: Self-pay | Admitting: Radiology

## 2023-09-14 MED ORDER — IBUPROFEN 800 MG PO TABS: 800.0000 mg | ORAL_TABLET | Freq: Three times a day (TID) | ORAL | 5 refills | Status: DC | PRN

## 2023-09-14 MED ORDER — TIZANIDINE HCL 4 MG PO CAPS: 4.0000 mg | ORAL_CAPSULE | Freq: Two times a day (BID) | ORAL | 2 refills | Status: DC | PRN

## 2023-09-14 NOTE — Telephone Encounter (Signed)
I spoke to patient about her MRI not approving until PT completed.  She understands.  She is hurting worse, and wants an appt for re evaluation.  Scheduled.  Maybe we can get auth depending on eval/findings at that visit?   Also asks for something for pain.  Uses [employer].

## 2023-09-14 NOTE — Congregational Nurse Program (Unsigned)
The patient presented at the clinic requesting a blood pressure check and stated that she was in chronic pain, as evidenced by the pain patch she was wearing. I instructed her to contact her PCP if her pain worsened.  [name] RN MSN CN [facility]

## 2023-09-14 NOTE — Telephone Encounter (Signed)
I called her to explain she is not home bound and can not have home health come in. She states she knows people who drive who have home health. I advised her in order to send order for home health we have to attest a person is homebound and if we are not truthful that is insurance fraud. She voiced understanding  She needs Ibuprofen 800 sent in so she will not have to buy so much ibuprofen OTC  when she finishes the Indocin and she is out of the Tizanidine as well (it did not go to pharmacy when primary care sent it printed)  Can you send to [employer] ?

## 2023-09-14 NOTE — Telephone Encounter (Signed)
Dr. Mort Sawyers pt - pt lvm requesting Home Health.  [phone]

## 2023-09-16 ENCOUNTER — Telehealth: Payer: Self-pay | Admitting: Orthopedic Surgery

## 2023-09-16 NOTE — Telephone Encounter (Signed)
Dr. Mort Sawyers pt - spoke w/the patient, she stated that she did not get to have her MRI done because [facility] denied it.  She is stated taht [facility] is requesting another referral for the MRI.  [phone]

## 2023-09-16 NOTE — Telephone Encounter (Signed)
She has to complete physical therapy AND return to office then we can order again left message to advise. She only went to 1 therapy visit

## 2023-09-17 ENCOUNTER — Telehealth: Payer: Self-pay | Admitting: Orthopedic Surgery

## 2023-09-17 NOTE — Telephone Encounter (Signed)
Dr. Romeo Apple   Patient called and states her Medicaid / [facility] isn't going to pay for her MRI.  You will need to send a referral for [facility] will pay for it.  She needs it authorized to have done.   Please call her back at [phone]

## 2023-09-17 NOTE — Telephone Encounter (Signed)
There is a message from yesterday She should keep appointment She has to finish therapy has only had 1 visit I am unable to reach her by phone  I am sorry you did not see message from yesterday when she called today

## 2023-09-20 ENCOUNTER — Ambulatory Visit: Payer: Medicaid Other | Admitting: Orthopedic Surgery

## 2023-09-23 ENCOUNTER — Ambulatory Visit (HOSPITAL_COMMUNITY): Payer: MEDICAID | Attending: Family Medicine | Admitting: Physical Therapy

## 2023-09-23 ENCOUNTER — Ambulatory Visit: Payer: MEDICAID | Admitting: Family Medicine

## 2023-09-23 ENCOUNTER — Encounter: Payer: Self-pay | Admitting: Family Medicine

## 2023-09-23 VITALS — BP 130/78 | HR 115 | Ht 63.0 in | Wt 158.0 lb

## 2023-09-23 DIAGNOSIS — I10 Essential (primary) hypertension: Secondary | ICD-10-CM

## 2023-09-23 DIAGNOSIS — M545 Low back pain, unspecified: Secondary | ICD-10-CM | POA: Diagnosis present

## 2023-09-23 DIAGNOSIS — G8929 Other chronic pain: Secondary | ICD-10-CM | POA: Insufficient documentation

## 2023-09-23 DIAGNOSIS — F1090 Alcohol use, unspecified, uncomplicated: Secondary | ICD-10-CM | POA: Diagnosis not present

## 2023-09-23 DIAGNOSIS — Z136 Encounter for screening for cardiovascular disorders: Secondary | ICD-10-CM | POA: Diagnosis not present

## 2023-09-23 DIAGNOSIS — M6281 Muscle weakness (generalized): Secondary | ICD-10-CM | POA: Diagnosis present

## 2023-09-23 DIAGNOSIS — R000 Tachycardia, unspecified: Secondary | ICD-10-CM

## 2023-09-23 DIAGNOSIS — Z1159 Encounter for screening for other viral diseases: Secondary | ICD-10-CM

## 2023-09-23 MED ORDER — NALTREXONE HCL 50 MG PO TABS: 50.0000 mg | ORAL_TABLET | Freq: Every day | ORAL | 2 refills | Status: DC

## 2023-09-23 NOTE — Progress Notes (Signed)
Patient Office Visit   Subjective   Patient ID: Heidi [name], female    DOB: 23-Feb-1971  Age: 52 y.o. MRN: [id number]  CC:  Chief Complaint  Patient presents with   Follow-up    HPI ESTHA FEW 52 year old presents to the clinic for chronic follow up. She  has a past medical history of Alcohol withdrawal (HCC), Cervical radiculopathy, and Malignant hyperthermia.  Palpitations  This is a recurrent problem. Palpations occurs constantly and has been waxing and waning. The symptoms are aggravated by stress. Associated symptoms include anxiety, diaphoresis, dizziness, malaise/fatigue and shortness of breath. She has tried beta blockers for the symptoms. The treatment provided no relief. Risk factors include family history, sedentary lifestyle and smoking/tobacco exposure. Her past medical history is significant for anxiety.        Outpatient Encounter Medications as of 09/23/2023  Medication Sig   citalopram (CELEXA) 20 MG tablet Take 20 mg by mouth daily.   cloNIDine (CATAPRES - DOSED IN MG/24 HR) 0.2 mg/24hr patch 0.2 mg once a week.   cloNIDine (CATAPRES) 0.1 MG tablet Take 1 tablet (0.1 mg total) by mouth daily.   diazepam (VALIUM) 5 MG tablet Take 5 mg by mouth once.   diltiazem (CARDIZEM CD) 120 MG 24 hr capsule Take 1 capsule (120 mg total) by mouth daily.   ferrous sulfate 325 (65 FE) MG tablet Take 325 mg by mouth daily with breakfast.   folic acid (FOLVITE) 1 MG tablet Take 1 tablet (1 mg total) by mouth daily.   ibuprofen (ADVIL) 800 MG tablet Take 1 tablet (800 mg total) by mouth every 8 (eight) hours as needed.   indomethacin (INDOCIN) 25 MG capsule Take 1 capsule (25 mg total) by mouth 2 (two) times daily with a meal.   lidocaine (LIDODERM) 5 % Place 1 patch onto the skin daily. Remove & Discard patch within 12 hours or as directed by MD   metoprolol tartrate (LOPRESSOR) 25 MG tablet Take 0.5 tablets (12.5 mg total) by mouth 2 (two) times daily.   naltrexone  (DEPADE) 50 MG tablet Take 1 tablet (50 mg total) by mouth daily.   nicotine (NICODERM CQ - DOSED IN MG/24 HOURS) 14 mg/24hr patch 14 mg daily.   pantoprazole (PROTONIX) 40 MG tablet Take 1 tablet (40 mg total) by mouth daily before breakfast.   tiZANidine (ZANAFLEX) 4 MG capsule Take 1 capsule (4 mg total) by mouth 2 (two) times daily as needed for muscle spasms.   traZODone (DESYREL) 150 MG tablet Take 150 mg by mouth at bedtime.   No facility-administered encounter medications on file as of 09/23/2023.    Past Surgical History:  Procedure Laterality Date   FLEXIBLE SIGMOIDOSCOPY N/A 07/13/2023   Procedure: FLEXIBLE SIGMOIDOSCOPY;  Surgeon: Dolores Frame, MD;  [facility];  Service: Gastroenterology;  Laterality: N/A;    Review of Systems  Constitutional:  Negative for chills and fever.  Eyes:  Negative for blurred vision.  Respiratory:  Positive for shortness of breath.   Cardiovascular:  Positive for palpitations. Negative for chest pain and leg swelling.  Gastrointestinal:  Negative for abdominal pain.  Genitourinary:  Negative for dysuria.  Neurological:  Positive for headaches. Negative for dizziness.      Objective    BP 130/78   Pulse (!) 115   Ht 5\' 3"  (1.6 m)   Wt 158 lb (71.7 kg)   SpO2 91%   BMI 27.99 kg/m   Physical Exam Vitals  reviewed.  Constitutional:      General: She is not in acute distress.    Appearance: Normal appearance. She is not ill-appearing, toxic-appearing or diaphoretic.  HENT:     Head: Normocephalic.  Eyes:     General:        Right eye: No discharge.        Left eye: No discharge.     Conjunctiva/sclera: Conjunctivae normal.  Cardiovascular:     Rate and Rhythm: Tachycardia present.     Pulses: Normal pulses.     Heart sounds: Normal heart sounds.  Pulmonary:     Effort: Pulmonary effort is normal. No respiratory distress.     Breath sounds: Normal breath sounds.  Abdominal:     General: Bowel sounds are  normal.     Palpations: Abdomen is soft.     Tenderness: There is no abdominal tenderness. There is no right CVA tenderness, left CVA tenderness or guarding.  Musculoskeletal:        General: Normal range of motion.     Cervical back: Normal range of motion.     Right lower leg: No edema.     Left lower leg: No edema.  Skin:    General: Skin is warm and dry.     Capillary Refill: Capillary refill takes less than 2 seconds.  Neurological:     Mental Status: She is alert and oriented to person, place, and time.     Coordination: Coordination normal.     Gait: Gait normal.  Psychiatric:        Mood and Affect: Mood normal.       Assessment & Plan:  Tachycardia Assessment & Plan: EKG ordered. Advise patient can be related to anxiety, Discussed to avoid or limit caffeine, alcohol, smoking, illegal drugs. Try relaxation techniques such as mediatation, yoga or deep breathing    Orders: -     EKG 12-Lead -     Ambulatory referral to Cardiology  Need for hepatitis C screening test -     Hepatitis C antibody  Encounter for screening for cardiovascular disorders -     Lipid panel -     Brain natriuretic peptide  Alcohol use disorder Assessment & Plan: AST, ALT within normal limits Started patient on Naltrexone 50 mg once daily Patient reported following up with [facility] services for recovery and has been abstinence from alcohol for 2 months Advise to continue avoiding purchasing alcohol, and remove alcohol from home.      Essential hypertension Assessment & Plan: Vitals:   09/23/23 1350 09/23/23 1415  BP: (!) 125/91 130/78  Metoprolol 12.5 mg twice daily and clonidine 0.1 mg once daily, Labs ordered. Continued discussion on DASH diet, low sodium diet and maintain a exercise routine for 150 minutes per week.    Other orders -     Naltrexone HCl; Take 1 tablet (50 mg total) by mouth daily.  Dispense: 30 tablet; Refill: 2    Return in about 4 weeks (around 10/21/2023),  or if symptoms worsen or fail to improve, for Pap smear.   Cruzita Lederer Newman Nip, FNP

## 2023-09-23 NOTE — Patient Instructions (Signed)

## 2023-09-23 NOTE — Assessment & Plan Note (Addendum)
Vitals:   09/23/23 1350 09/23/23 1415  BP: (!) 125/91 130/78  Metoprolol 12.5 mg twice daily and clonidine 0.1 mg once daily, Labs ordered. Continued discussion on DASH diet, low sodium diet and maintain a exercise routine for 150 minutes per week.

## 2023-09-23 NOTE — Assessment & Plan Note (Signed)
EKG ordered. Advise patient can be related to anxiety, Discussed to avoid or limit caffeine, alcohol, smoking, illegal drugs. Try relaxation techniques such as mediatation, yoga or deep breathing
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
## 2023-09-23 NOTE — Assessment & Plan Note (Signed)
AST, ALT within normal limits Started patient on Naltrexone 50 mg once daily Patient reported following up with [facility] services for recovery and has been abstinence from alcohol for 2 months Advise to continue avoiding purchasing alcohol, and remove alcohol from home.

## 2023-09-24 ENCOUNTER — Ambulatory Visit: Payer: Medicaid Other | Admitting: Orthopedic Surgery

## 2023-09-26 LAB — LIPID PANEL
Chol/HDL Ratio: 4.2 {ratio} (ref 0.0–4.4)
Cholesterol, Total: 236 mg/dL — ABNORMAL HIGH (ref 100–199)
HDL: 56 mg/dL (ref >39)
LDL Chol Calc (NIH): 145 mg/dL — ABNORMAL HIGH (ref 0–99)
Triglycerides: 195 mg/dL — ABNORMAL HIGH (ref 0–149)
VLDL Cholesterol Cal: 35 mg/dL (ref 5–40)

## 2023-09-26 LAB — BRAIN NATRIURETIC PEPTIDE: BNP: 25.2 pg/mL (ref 0.0–100.0)

## 2023-09-26 LAB — HEPATITIS C ANTIBODY: Hep C Virus Ab: NONREACTIVE

## 2023-09-27 ENCOUNTER — Encounter (HOSPITAL_COMMUNITY): Payer: Self-pay

## 2023-09-27 ENCOUNTER — Other Ambulatory Visit: Payer: Self-pay | Admitting: Family Medicine

## 2023-09-27 ENCOUNTER — Ambulatory Visit (HOSPITAL_COMMUNITY): Payer: MEDICAID

## 2023-09-27 DIAGNOSIS — M6281 Muscle weakness (generalized): Secondary | ICD-10-CM

## 2023-09-27 DIAGNOSIS — M545 Low back pain, unspecified: Secondary | ICD-10-CM | POA: Diagnosis not present

## 2023-09-27 DIAGNOSIS — G8929 Other chronic pain: Secondary | ICD-10-CM

## 2023-09-27 MED ORDER — ROSUVASTATIN CALCIUM 40 MG PO TABS: 40.0000 mg | ORAL_TABLET | Freq: Every day | ORAL | 3 refills | Status: DC

## 2023-09-27 MED ORDER — FISH OIL 1000 MG PO CAPS: 1.0000 | ORAL_CAPSULE | Freq: Two times a day (BID) | ORAL | 3 refills | Status: DC
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
## 2023-09-27 NOTE — Progress Notes (Signed)
Please inform patient,  PCS form will be ready on Friday  Cholesterol and Triglyceride levels elevated,  Plan of treatment will include Lifestyle changes and Crestor 40 mg once daily, I encourage over the counter omega-3 fish oil 1000 mg twice daily.   Patients with high cholesterol are at risk for Coronary Heart Diease such as myocardial infarction, angina and coronary artery stenosis.   Lifestyle Changes include Reducing Saturated and Trans Fats,  Limit intake of red meat, full-fat dairy products, fried foods, and processed foods.   Increase Soluble Fiber: Foods like oats, beans, lentils, fruits, and vegetables can help lower LDL cholesterol.   Eat More Omega-3 Fatty Acids:  Found in fatty fish (like salmon, mackerel, and sardines), flaxseeds, and walnuts, omega-3s supplements can help improve cholesterol levels.   Regular Physical Activity:  Aim for at least 150 minutes of moderate aerobic exercise or 75 minutes of vigorous exercise per week. Activities like walking, jogging, swimming, and cycling can help raise HDL (good) cholesterol and lower LDL cholesterol.    Maintain a Healthy Weight: Losing excess weight can help lower total cholesterol and improve overall heart health.   Smoking Cessation: Quitting smoking improves HDL cholesterol levels and benefits overall cardiovascular health.   Limit Alcohol : Moderate Alcohol Intake If you drink alcohol, do so in moderation. Excessive drinking can increase cholesterol and triglyceride levels.  Repeat labs in our next visit to monitor trends.

## 2023-09-28 ENCOUNTER — Telehealth: Payer: Self-pay | Admitting: *Deleted

## 2023-09-28 ENCOUNTER — Encounter: Payer: Self-pay | Admitting: Family Medicine

## 2023-09-28 ENCOUNTER — Telehealth: Payer: Self-pay | Admitting: Family Medicine

## 2023-09-28 NOTE — Telephone Encounter (Signed)
If her symptoms are so bad that she is having a hard time moving around and breathing, I recommend proceeding to the ER for evaluation.

## 2023-09-28 NOTE — Telephone Encounter (Signed)
Patient called asking when will home health be setup for her home?  [facility] has received the information patient has not yet heard anything.

## 2023-09-28 NOTE — Telephone Encounter (Signed)
Pt called and states that her stomach is swollen. She states she has never worn 158lbs. She can't fit her clothes and is having a hard time moving around and breathing. She also states her left side hurts. Would like to come in before November.

## 2023-09-29 ENCOUNTER — Other Ambulatory Visit: Payer: Self-pay | Admitting: Family Medicine

## 2023-09-29 NOTE — Telephone Encounter (Signed)
Pt has been scheduled with Kristen on 09/30/2023.

## 2023-09-29 NOTE — Telephone Encounter (Signed)
Its probably the same thing, She can pick up form today

## 2023-09-29 NOTE — Progress Notes (Signed)
Referring Provider: Wylene Men* Primary Care Physician:  Rica Records, FNP Primary GI Physician: Dr. Marletta Lor  Chief Complaint  Patient presents with   Bloated    Stomach is swollen     HPI:   Heidi [name] is a 52 y.o. female with history of hypertension, alcohol abuse, elevated LFTs, extended hospital stay in July with ileus in the setting of alcohol withdrawal developing severe DTs requiring sedation with Precedex, also noted anemia along with other abnormal findings on CT including peritoneal nodularity and possible splenic laceration versus infarct following flexible sigmoidoscopy for ileus, presenting today for follow-up with chief complaint of abdominal swelling.   Last seen in the office 08/05/2023 for hospital follow-up.   In regards to history of ileus, she was doing fairly well with bowel movements on a daily basis.  She had been advised to take MiraLAX daily, but she opted not to continue with this as her bowels were moving on their own.  She was advised to start MiraLAX if her bowels began to slow down.  She did report new onset early satiety since hospital discharge to her weight had remained stable and she denied nausea, vomiting, GERD, abdominal pain.  Offered EGD for further evaluation, but patient requested to think about this.  She did admit to taking a significant amount of Goody powders on a daily basis and was advised to discontinue these as well as to start Protonix 40 mg daily.   Regarding possible splenic laceration versus infarct as well as peritoneal nodularity noted on CT, discussed case with Dr. Marletta Lor who recommended repeating a CT in 3 months for surveillance.   Regarding her anemia, she denied overt GI bleeding.  She was taking iron daily reporting she had been on this for about 1 year and actually used to take iron years ago as well.  She had never had complete colonoscopy or upper endoscopy and was not interested in pursuing either  at that time.  I did recommend updating a CBC and checking an iron panel.  Regarding history of elevated LFTs and alcohol abuse, patient had remained abstinent since hospital discharge though she was taking 6, 650 mg tablets of Tylenol per day.  She is advised not to exceed 3000 mg of Tylenol per 24 hours and to repeat LFTs.  Advised to discuss pain management with PCP.  Labs were completed 08/06/2023 showing normal LFTs, hemoglobin improved to 11.4, ferritin elevated at 348, iron 58, iron saturation 22%.  Query whether elevated ferritin was related to recent acute illness as platelets were also elevated.  Recommended continuing iron for now and we would recheck labs at follow-up.   Today: About 2 weeks ago, started noticing that her stomach was getting swollen. Feels firm. Seems to be getting worse. Also with about 1 week of soreness in the LLQ when pressing on this area. First noticed in the shower while washing. No pain otherwise. No nausea or vomiting. Bowels are moving every other day, soft and productive. Doesn't feel constipated. Will take MiraLAX on occasion. No brbpr or melena. No GERD symptoms. Taking pantoprazole daily.   Eating well. Snacks a lot in the early morning hours. States she will be up eating oatmeal cookies, chocolate, etc. Drinks a lot of soda.   No alcohol.   4 ibuprofen 800 mg daily.  No tylenol.  Still taking iron and folic acid daily.   States she is ok with EGD and colonoscopy now.   Past Medical  History:  Diagnosis Date   Alcohol withdrawal (HCC)    Cervical radiculopathy    Malignant hyperthermia     Past Surgical History:  Procedure Laterality Date   FLEXIBLE SIGMOIDOSCOPY N/A 07/13/2023   Procedure: FLEXIBLE SIGMOIDOSCOPY;  Surgeon: Dolores Frame, MD;  Location: [facility];  Service: Gastroenterology;  Laterality: N/A;    Current Outpatient Medications  Medication Sig Dispense Refill   citalopram (CELEXA) 20 MG tablet Take 20 mg by  mouth daily.     cloNIDine (CATAPRES - DOSED IN MG/24 HR) 0.2 mg/24hr patch 0.2 mg once a week.     cloNIDine (CATAPRES) 0.1 MG tablet Take 1 tablet (0.1 mg total) by mouth daily. 30 tablet 2   diazepam (VALIUM) 5 MG tablet Take 5 mg by mouth as needed.     diltiazem (CARDIZEM CD) 120 MG 24 hr capsule Take 1 capsule (120 mg total) by mouth daily. 30 capsule 1   ferrous sulfate 325 (65 FE) MG tablet Take 325 mg by mouth daily with breakfast.     folic acid (FOLVITE) 1 MG tablet Take 1 tablet (1 mg total) by mouth daily. 30 tablet 3   ibuprofen (ADVIL) 800 MG tablet Take 1 tablet (800 mg total) by mouth every 8 (eight) hours as needed. 90 tablet 5   lidocaine (LIDODERM) 5 % Place 1 patch onto the skin daily. Remove & Discard patch within 12 hours or as directed by MD 30 patch 0   metoprolol tartrate (LOPRESSOR) 25 MG tablet Take 0.5 tablets (12.5 mg total) by mouth 2 (two) times daily. 60 tablet 2   naltrexone (DEPADE) 50 MG tablet Take 1 tablet (50 mg total) by mouth daily. 30 tablet 2   nicotine (NICODERM CQ - DOSED IN MG/24 HOURS) 14 mg/24hr patch 14 mg daily.     pantoprazole (PROTONIX) 40 MG tablet Take 1 tablet (40 mg total) by mouth daily before breakfast. 30 tablet 3   rosuvastatin (CRESTOR) 40 MG tablet Take 1 tablet (40 mg total) by mouth daily. 90 tablet 3   traZODone (DESYREL) 150 MG tablet Take 150 mg by mouth at bedtime.     gabapentin (NEURONTIN) 300 MG capsule Take 1 capsule (300 mg total) by mouth 3 (three) times daily. 90 capsule 5   indomethacin (INDOCIN) 25 MG capsule Take 1 capsule (25 mg total) by mouth 2 (two) times daily with a meal. (Patient not taking: Reported on 09/30/2023) 28 capsule 0   Omega-3 Fatty Acids (FISH OIL) 1000 MG CAPS Take 1 capsule (1,000 mg total) by mouth 2 (two) times daily. (Patient not taking: Reported on 09/30/2023) 90 capsule 3   predniSONE (DELTASONE) 10 MG tablet Take 1 tablet (10 mg total) by mouth 3 (three) times daily. 42 tablet 0   tiZANidine  (ZANAFLEX) 4 MG capsule Take 1 capsule (4 mg total) by mouth 2 (two) times daily as needed for muscle spasms. 60 capsule 2   No current facility-administered medications for this visit.    Allergies as of 09/30/2023   (No Known Allergies)    Family History  Problem Relation Age of Onset   Stroke Father     Social History   Socioeconomic History   Marital status: Widowed    Spouse name: Not on file   Number of children: Not on file   Years of education: Not on file   Highest education level: Not on file  Occupational History   Not on file  Tobacco Use   Smoking status:  Every Day    Current packs/day: 0.50    Average packs/day: 0.5 packs/day for 37.8 years (18.9 ttl pk-yrs)    Types: Cigarettes    Start date: 69   Smokeless tobacco: Never  Vaping Use   Vaping status: Never Used  Substance and Sexual Activity   Alcohol use: Not Currently    Alcohol/week: 42.0 standard drinks of alcohol    Types: 42 Cans of beer per week    Comment: 6 pack of beer daily. No alcohol since 07/05/23.   Drug use: No   Sexual activity: Yes  Other Topics Concern   Not on file  Social History Narrative   Not on file   Social Determinants of Health   Financial Resource Strain: Not on file  Food Insecurity: No Food Insecurity (07/05/2023)   Hunger Vital Sign    Worried About Running Out of Food in the Last Year: Never true    Ran Out of Food in the Last Year: Never true  Transportation Needs: No Transportation Needs (07/05/2023)   PRAPARE - Administrator, Civil Service (Medical): No    Lack of Transportation (Non-Medical): No  Physical Activity: Not on file  Stress: Not on file  Social Connections: Not on file    Review of Systems: Gen: Denies fever, chills,  CV: Denies chest pain, palpitations.  Resp: Denies dyspnea, cough.  GI: See HPI Heme: See HPI  Physical Exam: BP 112/73 (BP Location: Right Arm, Patient Position: Sitting, Cuff Size: Normal)   Pulse 85   Temp  97.7 F (36.5 C) (Temporal)   Ht 5\' 3"  (1.6 m)   Wt 162 lb 3.2 oz (73.6 kg)   BMI 28.73 kg/m  General:   Alert and oriented. No distress noted. Pleasant and cooperative.  Head:  Normocephalic and atraumatic. Eyes:  Conjuctiva clear without scleral icterus. Heart:  S1, S2 present without murmurs appreciated. Lungs:  Clear to auscultation bilaterally. No wheezes, rales, or rhonchi. No distress.  Abdomen:  +BS. Full, round abdomen, but non tense. Mild TTP in epigastric area. No rebound or guarding. No HSM or masses noted. Msk:  Symmetrical without gross deformities. Normal posture. Extremities:  Without edema. Neurologic:  Alert and  oriented x4 Psych:  Normal mood and affect.    Assessment:  52 y.o. female with history of hypertension, alcohol abuse, elevated LFTs, extended hospital stay in July with ileus in the setting of alcohol withdrawal developing severe DTs requiring sedation with Precedex, also noted anemia along with other abnormal findings on CT including peritoneal nodularity and possible splenic laceration versus infarct following flexible sigmoidoscopy for ileus, presenting today for follow-up with chief complaint of abdominal swelling.   Abdominal distension:  Query whether this is related to weight gain from dietary indiscretion, eating high calorie foods/drinking soda since alcohol abstinence. Symptoms not consistent with ileus as bowels are moving well and she has normal bowel sounds today on exam. Less likely development of ascites as she had no obvious cirrhosis on prior imaging, but can't rule this out as there was some asciets noted on a prior CT in July. Notably,  CT at that time also mentioned soft tissue stranding throughout the mesenteric fat and possible peritoneal nodularity with inability to exclude peritoneal carcinomatosis though this was not redemonstrated on follow-up CTs.  Had previously recommended follow-up CT in 3 months from the last (8/1) which would be due  in November.    Will plan to update an [place] and labs for further evaluation.  May need to consider updating CT abdomen early pending ultrasound findings.  Discussed dietary changes to help with weight loss as she has gained 27 lbs in the last 2 months.    Splenic lesion: Possible splenic laceration or infarct noted on CT following flexible sigmoidoscopy.  Flexible sigmoidoscopy was completed 7/23.  CT on 7/28 with possible splenic laceration versus abscess versus infarct.  Follow-up CT 8/1 with stable appearance of splenic lesion most likely splenic laceration or infarct, infection or neoplasm less likely.  No evidence of perisplenic hematoma or hemoperitoneum. Case previously discussed with Dr. Marletta Lor who recommended 23-month surveillance CT, due next month.  Abnormal CT abdomen/peritoneal nodularity: CT A/P without contrast 07/12/2023 with moderate amount of low-density ascites with soft tissue stranding throughout the mesenteric fat and possible peritoneal nodularity in the right mid abdomen.  Radiologist stated cannot exclude peritoneal carcinomatosis.  Notably, follow-up CT A/P with contrast on 7/28 and 8/1 did not redemonstrate any of these findings.  Also discussed with Dr. Marletta Lor who recommended repeat CT in 3 months for surveillance, due in November.  Anemia: Hemoglobin declined from 12 range to as low as 8.5 during recent hospitalization in July with no overt GI bleeding.  No vaginal bleeding on Depo. She does have history of significant NSAID use which she has continued. Has some mild TTP in the epigastric area and very well could have gastritis, duodenitis, PUD. She is maintaining on PPI daily. She has also been taking iron daily for about 1 year now and has never completed an upper endoscopy or colonoscopy.  She declined this at her last office visit, but states she is interested in proceeding at this time.  We will consider scheduling EGD and colonoscopy following evaluation of abdominal  distention as discussed above.  Encouragingly, hemoglobin previously normalized to 11.6 on 8/21.  We will update CBC for stability and check iron panel.   Plan:  CBC, CMP, iron panel US abdomen ASAP Due for surveillance CT abdomen/pelvis next month. May need to update sooner pending [place] results.  EGD and colonoscopy for anemia will be arranged following evaluation of abdominal distension.  Continue pantoprazole 40 mg daily.  Recommended limiting ibuprofen as much as possible.  OK to take tylenol. No more than 2000-3000 mg per day. Continue iron daily. Further recommendations to follow labs and imaging.    Ermalinda Memos, PA-C [facility] 09/30/2023

## 2023-09-29 NOTE — Telephone Encounter (Signed)
Called patient, left VM.

## 2023-09-30 ENCOUNTER — Encounter: Payer: Self-pay | Admitting: Gastroenterology

## 2023-09-30 ENCOUNTER — Ambulatory Visit (INDEPENDENT_AMBULATORY_CARE_PROVIDER_SITE_OTHER): Payer: MEDICAID | Admitting: Gastroenterology

## 2023-09-30 VITALS — BP 112/73 | HR 85 | Temp 97.7°F | Ht 63.0 in | Wt 162.2 lb

## 2023-09-30 DIAGNOSIS — R935 Abnormal findings on diagnostic imaging of other abdominal regions, including retroperitoneum: Secondary | ICD-10-CM | POA: Diagnosis not present

## 2023-09-30 DIAGNOSIS — D649 Anemia, unspecified: Secondary | ICD-10-CM | POA: Diagnosis not present

## 2023-09-30 DIAGNOSIS — R140 Abdominal distension (gaseous): Secondary | ICD-10-CM | POA: Diagnosis not present

## 2023-09-30 DIAGNOSIS — D7389 Other diseases of spleen: Secondary | ICD-10-CM

## 2023-09-30 NOTE — Patient Instructions (Signed)
We will get you scheduled for an ultrasound of your abdomen at [facility].  Please have labs completed at [facility] as well.  Further recommendations to follow.  Ermalinda Memos, PA-C [facility]

## 2023-10-01 ENCOUNTER — Ambulatory Visit (HOSPITAL_COMMUNITY)
Admission: RE | Admit: 2023-10-01 | Discharge: 2023-10-01 | Disposition: A | Discharge status: Home / Self Care | Payer: MEDICAID | Source: Ambulatory Visit | Attending: Gastroenterology | Admitting: Gastroenterology

## 2023-10-01 ENCOUNTER — Ambulatory Visit: Payer: MEDICAID | Admitting: Orthopedic Surgery

## 2023-10-01 ENCOUNTER — Ambulatory Visit (INDEPENDENT_AMBULATORY_CARE_PROVIDER_SITE_OTHER): Payer: MEDICAID

## 2023-10-01 ENCOUNTER — Ambulatory Visit (HOSPITAL_COMMUNITY): Payer: MEDICAID

## 2023-10-01 ENCOUNTER — Encounter: Payer: Self-pay | Admitting: Gastroenterology

## 2023-10-01 ENCOUNTER — Encounter: Payer: Self-pay | Admitting: Orthopedic Surgery

## 2023-10-01 VITALS — BP 133/83 | HR 68 | Ht 63.0 in | Wt 160.0 lb

## 2023-10-01 DIAGNOSIS — M47812 Spondylosis without myelopathy or radiculopathy, cervical region: Secondary | ICD-10-CM

## 2023-10-01 DIAGNOSIS — G8929 Other chronic pain: Secondary | ICD-10-CM

## 2023-10-01 DIAGNOSIS — G5601 Carpal tunnel syndrome, right upper limb: Secondary | ICD-10-CM | POA: Diagnosis not present

## 2023-10-01 DIAGNOSIS — R140 Abdominal distension (gaseous): Secondary | ICD-10-CM | POA: Insufficient documentation

## 2023-10-01 DIAGNOSIS — M25511 Pain in right shoulder: Secondary | ICD-10-CM

## 2023-10-01 DIAGNOSIS — M79641 Pain in right hand: Secondary | ICD-10-CM

## 2023-10-01 DIAGNOSIS — R202 Paresthesia of skin: Secondary | ICD-10-CM

## 2023-10-01 DIAGNOSIS — M545 Low back pain, unspecified: Secondary | ICD-10-CM | POA: Diagnosis not present

## 2023-10-01 DIAGNOSIS — M542 Cervicalgia: Secondary | ICD-10-CM

## 2023-10-01 DIAGNOSIS — M6281 Muscle weakness (generalized): Secondary | ICD-10-CM

## 2023-10-01 LAB — COMPLETE METABOLIC PANEL WITH GFR
AG Ratio: 1.5 (calc) (ref 1.0–2.5)
ALT: 9 U/L (ref 6–29)
AST: 13 U/L (ref 10–35)
Albumin: 4.1 g/dL (ref 3.6–5.1)
Alkaline phosphatase (APISO): 77 U/L (ref 37–153)
BUN: 8 mg/dL (ref 7–25)
CO2: 25 mmol/L (ref 20–32)
Calcium: 10.2 mg/dL (ref 8.6–10.4)
Chloride: 110 mmol/L (ref 98–110)
Creat: 0.8 mg/dL (ref 0.50–1.03)
Globulin: 2.7 g/dL (ref 1.9–3.7)
Glucose, Bld: 92 mg/dL (ref 65–99)
Potassium: 4.4 mmol/L (ref 3.5–5.3)
Sodium: 142 mmol/L (ref 135–146)
Total Bilirubin: 0.4 mg/dL (ref 0.2–1.2)
Total Protein: 6.8 g/dL (ref 6.1–8.1)
eGFR: 90 mL/min/{1.73_m2} (ref >=60)

## 2023-10-01 LAB — CBC WITH DIFFERENTIAL/PLATELET
Absolute Monocytes: 580 {cells}/uL (ref 200–950)
Basophils Absolute: 41 {cells}/uL (ref 0–200)
Basophils Relative: 0.7 %
Eosinophils Absolute: 81 {cells}/uL (ref 15–500)
Eosinophils Relative: 1.4 %
HCT: 34.5 % — ABNORMAL LOW (ref 35.0–45.0)
Hemoglobin: 11.5 g/dL — ABNORMAL LOW (ref 11.7–15.5)
Lymphs Abs: 1949 {cells}/uL (ref 850–3900)
MCH: 29 pg (ref 27.0–33.0)
MCHC: 33.3 g/dL (ref 32.0–36.0)
MCV: 86.9 fL (ref 80.0–100.0)
MPV: 11.2 fL (ref 7.5–12.5)
Monocytes Relative: 10 %
Neutro Abs: 3149 {cells}/uL (ref 1500–7800)
Neutrophils Relative %: 54.3 %
Platelets: 219 10*3/uL (ref 140–400)
RBC: 3.97 10*6/uL (ref 3.80–5.10)
RDW: 13.1 % (ref 11.0–15.0)
Total Lymphocyte: 33.6 %
WBC: 5.8 10*3/uL (ref 3.8–10.8)

## 2023-10-01 LAB — IRON,TIBC AND FERRITIN PANEL
%SAT: 29 % (ref 16–45)
Ferritin: 95 ng/mL (ref 16–232)
Iron: 92 ug/dL (ref 45–160)
TIBC: 313 ug/dL (ref 250–450)

## 2023-10-01 MED ORDER — PREDNISONE 10 MG PO TABS
10.0000 mg | ORAL_TABLET | Freq: Three times a day (TID) | ORAL | 0 refills | Status: DC
Start: 2023-10-01 — End: 2023-12-18

## 2023-10-01 MED ORDER — TIZANIDINE HCL 4 MG PO CAPS: 4.0000 mg | ORAL_CAPSULE | Freq: Two times a day (BID) | ORAL | 2 refills | Status: DC | PRN

## 2023-10-01 MED ORDER — GABAPENTIN 300 MG PO CAPS
300.0000 mg | ORAL_CAPSULE | Freq: Three times a day (TID) | ORAL | 5 refills | Status: DC
Start: 2023-10-01 — End: 2023-10-11

## 2023-10-01 NOTE — Patient Instructions (Signed)
We are referring you to [facility] from [facility] Office address is [address] [place] The phone number is [phone]  The office will call you with an appointment Dr. Newton will do the nerve study   

## 2023-10-01 NOTE — Addendum Note (Signed)
Addended byCaffie Damme on: 10/01/2023 10:37 AM   Modules accepted: Orders

## 2023-10-01 NOTE — Addendum Note (Signed)
Addended byCaffie Damme on: 10/01/2023 10:34 AM   Modules accepted: Orders

## 2023-10-01 NOTE — Progress Notes (Signed)
Office Visit Note   Patient: Heidi [name]           Date of Birth: Jun 03, 1971           MRN: [id number] Visit Date: 10/01/2023 Requested by: Rica Records, FNP [phone] [address] [place],  [place] [address] PCP: Rica Records, FNP   Assessment & Plan:   Encounter Diagnoses  Name Primary?   Neck pain Yes   Chronic right shoulder pain    Pain in right hand    Soft tissue lesion of knee region     Meds ordered this encounter  Medications   gabapentin (NEURONTIN) 300 MG capsule    Sig: Take 1 capsule (300 mg total) by mouth 3 (three) times daily.    Dispense:  90 capsule    Refill:  5   predniSONE (DELTASONE) 10 MG tablet    Sig: Take 1 tablet (10 mg total) by mouth 3 (three) times daily.    Dispense:  42 tablet    Refill:  0   tiZANidine (ZANAFLEX) 4 MG capsule    Sig: Take 1 capsule (4 mg total) by mouth 2 (two) times daily as needed for muscle spasms.    Dispense:  60 capsule    Refill:  2    NCS TO BE DONE   PT NECK      Subjective: Chief Complaint  Patient presents with   Shoulder Pain    RIGHT    Arm Pain    RIGHT    Hand Pain    RIGHT     HPI: 52 year old female presents with right shoulder arm and hand pain.  She reports cramping and spasms of her right hand and then pain in the upper aspect of her right shoulder which is really best described as pain in the trapezial muscle and medial scapula.  There is no pain over the right shoulder joint.              ROS: History of carpal tunnel syndrome of the right upper extremity, has a history of cervical radiculopathy radiculitis brachial neuritis   Images personally read and my interpretation :  SEE DICTATED REPORTS   C SPINE SPONDYLOSIS HAND NORMAL  SHOULDER NORMAL   Visit Diagnoses:  1. Neck pain   2. Chronic right shoulder pain   3. Pain in right hand   4. Soft tissue lesion of knee region      Follow-Up Instructions: No follow-ups on file.    Objective: Vital  Signs: BP 133/83   Pulse 68   Ht 5\' 3"  (1.6 m)   Wt 160 lb (72.6 kg)   BMI 28.34 kg/m   Physical Exam Vitals and nursing note reviewed.  Constitutional:      Appearance: Normal appearance.  HENT:     Head: Normocephalic and atraumatic.  Eyes:     General: No scleral icterus.       Right eye: No discharge.        Left eye: No discharge.     Extraocular Movements: Extraocular movements intact.     Conjunctiva/sclera: Conjunctivae normal.     Pupils: Pupils are equal, round, and reactive to light.  Cardiovascular:     Rate and Rhythm: Normal rate.     Pulses: Normal pulses.  Skin:    General: Skin is warm and dry.     Capillary Refill: Capillary refill takes less than 2 seconds.  Neurological:  General: No focal deficit present.     Mental Status: She is alert and oriented to person, place, and time.  Psychiatric:        Mood and Affect: Mood normal.        Behavior: Behavior normal.        Thought Content: Thought content normal.        Judgment: Judgment normal.      Ortho Exam Gait was normal  The patient's tenderness was in the right upper shoulder near the trapezius muscle no glenohumeral joint line tenderness normal range of motion of the right shoulder without weakness  Tenderness or pain increased with rotation of the neck to the right and lateral flexion to the right.  The neck in the midline was nontender.  There were no neurovascular deficits distally.  The right hand at the small finger appears to have a chronic tendon laceration does not need treatment or management  Specialty Comments:  No specialty comments available.  Imaging: DG Hand Complete Right  Result Date: 10/01/2023 Right hand x-ray Normal scapholunate interval normal radiocarpal joint normal interphalangeal joints normal carpal bones Normal hand   DG Shoulder Right  Result Date: 10/01/2023 Images of the rightShoulder shoulder Normal glenohumeral joint normal humeral head glenoid  humerus scapula chest wall Normal shoulder   DG Cervical Spine 2 or 3 views  Result Date: 10/01/2023 X-ray of the cervical spine shows again C5 and 6 C6 and 7 degenerative disc changes anterior osteophytes disc space narrowing abnormal cervical lordosis mid cervical osteophytes in the uncovertebral joints Impression cervical spondylosis     PMFS History: Patient Active Problem List   Diagnosis Date Noted   Degenerative disc disease, lumbar 08/11/2023   Hospital discharge follow-up 07/29/2023   Laceration of spleen 07/24/2023   Ileus (HCC) 07/13/2023   Elevated LFTs 07/12/2023   Colonic obstruction (HCC) 07/12/2023   Hypothermia 07/09/2023   Hypokalemia 07/06/2023   Thrombocytopenia (HCC) 07/06/2023   Chronic back pain 05/18/2023   Bilateral knee pain 04/26/2023   Depression, recurrent (HCC) 04/26/2023   Alcohol use disorder 04/20/2023   Carpal tunnel syndrome of right wrist 04/20/2023   Tachycardia 04/20/2023   Transaminitis 04/06/2023   GERD (gastroesophageal reflux disease) 04/06/2023   Tobacco use disorder 04/06/2023   Alcohol withdrawal (HCC) 10/19/2022   Essential hypertension 10/19/2022   Nausea & vomiting 10/19/2022   Hypomagnesemia 10/19/2022   Brachial neuritis or radiculitis 10/29/2010   Past Medical History:  Diagnosis Date   Alcohol withdrawal (HCC)    Cervical radiculopathy    Malignant hyperthermia     Family History  Problem Relation Age of Onset   Stroke Father     Past Surgical History:  Procedure Laterality Date   FLEXIBLE SIGMOIDOSCOPY N/A 07/13/2023   Procedure: FLEXIBLE SIGMOIDOSCOPY;  Surgeon: Dolores Frame, MD;  Location: [facility];  Service: Gastroenterology;  Laterality: N/A;   Social History   Occupational History   Not on file  Tobacco Use   Smoking status: Every Day    Current packs/day: 0.50    Average packs/day: 0.5 packs/day for 37.8 years (18.9 ttl pk-yrs)    Types: Cigarettes    Start date: 63   Smokeless  tobacco: Never  Vaping Use   Vaping status: Never Used  Substance and Sexual Activity   Alcohol use: Not Currently    Alcohol/week: 42.0 standard drinks of alcohol    Types: 42 Cans of beer per week    Comment: 6 pack of beer daily.  No alcohol since 07/05/23.   Drug use: No   Sexual activity: Yes
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
## 2023-10-06 ENCOUNTER — Encounter (HOSPITAL_COMMUNITY): Payer: Medicaid Other

## 2023-10-06 ENCOUNTER — Telehealth (HOSPITAL_COMMUNITY): Payer: Self-pay

## 2023-10-06 NOTE — Telephone Encounter (Signed)
Patient called the clinic today at 11am to inform that she won't be able to make it for her 11am appointment today because she just woke up. Patient was able to speak with [place].  Tish Frederickson. Sharian Delia, PT, DPT, OCS Board-Certified Clinical Specialist in Orthopedic PT PT Compact Privilege # ([place]): [phone] T

## 2023-10-08 ENCOUNTER — Encounter: Payer: MEDICAID | Admitting: Physical Medicine and Rehabilitation

## 2023-10-11 ENCOUNTER — Telehealth: Payer: Self-pay | Admitting: Orthopedic Surgery

## 2023-10-11 ENCOUNTER — Ambulatory Visit: Payer: MEDICAID | Admitting: Orthopedic Surgery

## 2023-10-11 VITALS — Ht 63.0 in | Wt 160.0 lb

## 2023-10-11 DIAGNOSIS — M47812 Spondylosis without myelopathy or radiculopathy, cervical region: Secondary | ICD-10-CM | POA: Diagnosis not present

## 2023-10-11 MED ORDER — GABAPENTIN 400 MG PO CAPS
400.0000 mg | ORAL_CAPSULE | Freq: Three times a day (TID) | ORAL | 5 refills | Status: DC
Start: 2023-10-11 — End: 2024-03-06

## 2023-10-11 NOTE — Telephone Encounter (Signed)
Dr. Mort Sawyers pt - pt lvm stating that she needs a script for her to be able to have the MRI because she is claustrophobic.

## 2023-10-11 NOTE — Progress Notes (Signed)
Chief Complaint  Patient presents with   Follow-up    Recheck on back pain. Went for 4 PT visits helped loosen up the pain but still has pain legs burning nubmness    52 year old female with chronic back pain approximately 10 years, was treated as noted below has not improved she says her pain is increasing she is having right-sided lower back pain with some midline symptoms with pain radiating to the right leg   We sent her for physical therapy she had 4 visits still complains of bilateral burning leg pain exacerbated by lifting objects or bending forward  The gabapentin 300-3 times a day Indocin 25 3 times a day/ibuprofen 803 times a day 4 mg the tizanidine is not controlling her pain she also had a steroid Dosepak  Recommend increasing gabapentin to 400, 3 times a day  Also recommend MRI of the spine  Follow-up Dr. Call results to patient  Meds ordered this encounter  Medications   gabapentin (NEURONTIN) 400 MG capsule    Sig: Take 1 capsule (400 mg total) by mouth 3 (three) times daily.    Dispense:  60 capsule    Refill:  5    Encounter Diagnosis  Name Primary?   Spondylosis without myelopathy or radiculopathy, cervical region Yes   Prior examination showed the following: Gait assessment: The patient stands with AB normal gait and station cane right hand   Lumbar spine Tenderness  to palpation is noted in the LEFT L 5 S1 segment  Range of motion decreased Muscle tone   normal on the right and left sides of the spine   Lower extremities right and left Normal range of motion hip knee and ankle All 3 joints are reduced and stable   Normal muscle tone in both lower extremities  neurologic right lower extremity examination  Reflexes were attempted to be evaluated she could not cooperate  Sensation was normal in both feet and legs      Babinski's tests were down going  Straight leg raise testing normal   The vascular examination revealed normal dorsalis pedis pulses  in both feet and both feet were warm with good capillary refill

## 2023-10-11 NOTE — Patient Instructions (Signed)
While we are working on your approval for MRI please go ahead and call to schedule your appointment with [facility] within at least one (1) week.   Central Scheduling [phone]  

## 2023-10-12 ENCOUNTER — Ambulatory Visit (HOSPITAL_COMMUNITY): Payer: MEDICAID | Admitting: Physical Therapy

## 2023-10-12 DIAGNOSIS — M6281 Muscle weakness (generalized): Secondary | ICD-10-CM

## 2023-10-12 DIAGNOSIS — M545 Low back pain, unspecified: Secondary | ICD-10-CM | POA: Diagnosis not present
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
## 2023-10-13 ENCOUNTER — Other Ambulatory Visit: Payer: Self-pay | Admitting: Orthopedic Surgery

## 2023-10-13 DIAGNOSIS — F40240 Claustrophobia: Secondary | ICD-10-CM

## 2023-10-13 MED ORDER — ALPRAZOLAM 0.25 MG PO TABS: 0.2500 mg | ORAL_TABLET | Freq: Every evening | ORAL | 0 refills | Status: DC | PRN

## 2023-10-13 NOTE — Progress Notes (Signed)
Meds ordered this encounter  Medications   ALPRAZolam (XANAX) 0.25 MG tablet    Sig: Take 1 tablet (0.25 mg total) by mouth at bedtime as needed for anxiety.    Dispense:  30 tablet    Refill:  0  '

## 2023-10-14 ENCOUNTER — Encounter (HOSPITAL_COMMUNITY): Payer: Medicaid Other | Admitting: Physical Therapy

## 2023-10-14 ENCOUNTER — Telehealth (HOSPITAL_COMMUNITY): Payer: Self-pay | Admitting: Physical Therapy

## 2023-10-14 NOTE — Telephone Encounter (Signed)
Pt did not show for appt.  Called and spoke to pt who stated she had a dentist appt today and that's why she did not make it.  Reminded of NS policy and next appt on Monday 10/28 at 11 am.   Lurena Nida, PTA/CLT [facility] Ph: [phone]

## 2023-10-15 ENCOUNTER — Ambulatory Visit: Payer: MEDICAID | Admitting: Physical Medicine and Rehabilitation

## 2023-10-15 DIAGNOSIS — G8929 Other chronic pain: Secondary | ICD-10-CM

## 2023-10-15 DIAGNOSIS — M25511 Pain in right shoulder: Secondary | ICD-10-CM | POA: Diagnosis not present

## 2023-10-15 DIAGNOSIS — R202 Paresthesia of skin: Secondary | ICD-10-CM

## 2023-10-15 DIAGNOSIS — M79601 Pain in right arm: Secondary | ICD-10-CM

## 2023-10-15 DIAGNOSIS — R531 Weakness: Secondary | ICD-10-CM

## 2023-10-15 NOTE — Progress Notes (Unsigned)
Functional Pain Scale - descriptive words and definitions  Unmanageable (7)  Pain interferes with normal ADL's/nothing seems to help/sleep is very difficult/active distractions are very difficult to concentrate on. Severe range order  Average Pain 7  RUE NCS Pain, numbness, tingling in right arm down to hand. Difficultly grasping.

## 2023-10-18 ENCOUNTER — Other Ambulatory Visit: Payer: Self-pay | Admitting: *Deleted

## 2023-10-18 ENCOUNTER — Ambulatory Visit (HOSPITAL_COMMUNITY)
Admission: RE | Admit: 2023-10-18 | Discharge: 2023-10-18 | Disposition: A | Discharge status: Home / Self Care | Payer: MEDICAID | Source: Ambulatory Visit | Attending: Orthopedic Surgery | Admitting: Orthopedic Surgery

## 2023-10-18 ENCOUNTER — Encounter (HOSPITAL_COMMUNITY): Payer: Medicaid Other | Admitting: Physical Therapy

## 2023-10-18 DIAGNOSIS — M47812 Spondylosis without myelopathy or radiculopathy, cervical region: Secondary | ICD-10-CM | POA: Insufficient documentation

## 2023-10-18 DIAGNOSIS — D7389 Other diseases of spleen: Secondary | ICD-10-CM

## 2023-10-18 DIAGNOSIS — R935 Abnormal findings on diagnostic imaging of other abdominal regions, including retroperitoneum: Secondary | ICD-10-CM

## 2023-10-19 ENCOUNTER — Encounter: Payer: Self-pay | Admitting: Physical Medicine and Rehabilitation

## 2023-10-19 NOTE — Procedures (Signed)
EMG & NCV Findings: Evaluation of the right median motor nerve showed prolonged distal onset latency (4.4 ms) and decreased conduction velocity (Elbow-Wrist, 43 m/s).  The right median (across palm) sensory nerve showed prolonged distal peak latency (4.7 ms) and reduced amplitude (9.6 V).  All remaining nerves (as indicated in the following tables) were within normal limits.    All examined muscles (as indicated in the following table) showed no evidence of electrical instability.    Impression: The above electrodiagnostic study is ABNORMAL and reveals evidence of a moderate right median nerve entrapment at the wrist (carpal tunnel syndrome) affecting sensory and motor components.   There is no significant electrodiagnostic evidence of any other focal nerve entrapment, brachial plexopathy or cervical radiculopathy.   Recommendations: 1.  Follow-up with referring physician. 2.  Continue current management of symptoms. 3.  Continue use of resting splint at night-time and as needed during the day. 4.  Suggest surgical evaluation.  ___________________________ Naaman Plummer FAAPMR Board Certified, American Board of Physical Medicine and Rehabilitation    Nerve Conduction Studies Anti Sensory Summary Table   Stim Site NR Peak (ms) Norm Peak (ms) P-T Amp (V) Norm P-T Amp Site1 Site2 Delta-P (ms) Dist (cm) Vel (m/s) Norm Vel (m/s)  Right Median Acr Palm Anti Sensory (2nd Digit)  32.2C  Wrist    *4.7 <3.6 *9.6 >10        Right Radial Anti Sensory (Base 1st Digit)  32C  Wrist    2.3 <3.1 17.8  Wrist Base 1st Digit 2.3 0.0    Right Ulnar Anti Sensory (5th Digit)  32.4C  Wrist    3.3 <3.7 18.3 >15.0 Wrist 5th Digit 3.3 14.0 42 >38   Motor Summary Table   Stim Site NR Onset (ms) Norm Onset (ms) O-P Amp (mV) Norm O-P Amp Site1 Site2 Delta-0 (ms) Dist (cm) Vel (m/s) Norm Vel (m/s)  Right Median Motor (Abd Poll Brev)  32.2C  Wrist    *4.4 <4.2 6.5 >5 Elbow Wrist 4.9 21.0 *43 >50  Elbow     9.3  2.9         Right Ulnar Motor (Abd Dig Min)  32.2C  Wrist    2.7 <4.2 7.8 >3 B Elbow Wrist 3.7 20.0 54 >53  B Elbow    6.4  8.0  A Elbow B Elbow 1.6 10.0 63 >53  A Elbow    8.0  7.7          EMG   Side Muscle Nerve Root Ins Act Fibs Psw Amp Dur Poly Recrt Int Dennie Bible Comment  Right Abd Poll Brev Median C8-T1 Nml Nml Nml Nml Nml 0 Nml Nml   Right 1stDorInt Ulnar C8-T1 Nml Nml Nml Nml Nml 0 Nml Nml   Right PronatorTeres Median C6-7 Nml Nml Nml Nml Nml 0 Nml Nml   Right Biceps Musculocut C5-6 Nml Nml Nml Nml Nml 0 Nml Nml   Right Deltoid Axillary C5-6 Nml Nml Nml Nml Nml 0 Nml Nml     Nerve Conduction Studies Anti Sensory Left/Right Comparison   Stim Site L Lat (ms) R Lat (ms) L-R Lat (ms) L Amp (V) R Amp (V) L-R Amp (%) Site1 Site2 L Vel (m/s) R Vel (m/s) L-R Vel (m/s)  Median Acr Palm Anti Sensory (2nd Digit)  32.2C  Wrist  *4.7   *9.6        Radial Anti Sensory (Base 1st Digit)  32C  Wrist  2.3   17.8  Wrist Base  1st Digit     Ulnar Anti Sensory (5th Digit)  32.4C  Wrist  3.3   18.3  Wrist 5th Digit  42    Motor Left/Right Comparison   Stim Site L Lat (ms) R Lat (ms) L-R Lat (ms) L Amp (mV) R Amp (mV) L-R Amp (%) Site1 Site2 L Vel (m/s) R Vel (m/s) L-R Vel (m/s)  Median Motor (Abd Poll Brev)  32.2C  Wrist  *4.4   6.5  Elbow Wrist  *43   Elbow  9.3   2.9        Ulnar Motor (Abd Dig Min)  32.2C  Wrist  2.7   7.8  B Elbow Wrist  54   B Elbow  6.4   8.0  A Elbow B Elbow  63   A Elbow  8.0   7.7           Waveforms:

## 2023-10-19 NOTE — Addendum Note (Signed)
Addended by: Ashok Norris on: 10/19/2023 12:17 PM   Modules accepted: Level of Service

## 2023-10-19 NOTE — Progress Notes (Addendum)
[name] - 52 y.o. female MRN [id number]  Date of birth: 02-Dec-1971  Office Visit Note: Visit Date: 10/15/2023 PCP: Rica Records, FNP Referred by: Vickki Hearing, MD  Subjective: Chief Complaint  Patient presents with   Right Arm - Pain, Numbness, Tingling   HPI: KIMMYA BRACKENS is a 52 y.o. female who comes in today at the request of Dr. Fuller Canada for evaluation and management of chronic, worsening and severe pain, numbness and tingling in the Right upper extremities.  Patient is Right hand dominant.  She reports chronic ongoing history recalcitrant to treatment and physical therapy of right sided shoulder and arm pain down into the hand with paresthesias in a nondermatomal fashion.  She has neck pain.  She talks about a history of carpal tunnel syndrome but no history of release.  She has been diagnosed with cervical radiculopathy in the past.  She denies any real left-sided complaints.  Symptoms are worse with activity sometimes worse at night.  She is dropping objects and does feel weaker.  She does feel achy goes down the arm.  She has not had any advanced imaging of the cervical spine.  No prior electrodiagnostic studies.  She is not diabetic.  She does have chronic pain syndrome and chronic low back pain with MRI on order of her low back.   I spent more than 30 minutes speaking face-to-face with the patient with 50% of the time in counseling and discussing coordination of care.      Impression: The above electrodiagnostic study is ABNORMAL and reveals evidence of a moderate right median nerve entrapment at the wrist (carpal tunnel syndrome) affecting sensory and motor components.   There is no significant electrodiagnostic evidence of any other focal nerve entrapment, brachial plexopathy or cervical radiculopathy.   Recommendations: 1.  Follow-up with referring physician. 2.  Continue current management of symptoms. 3.  Continue use of resting splint  at night-time and as needed during the day. 4.  Suggest surgical evaluation.  Review of Systems  Musculoskeletal:  Positive for back pain, joint pain and neck pain.  Neurological:  Positive for tingling and weakness.  All other systems reviewed and are negative.  Otherwise per HPI.  Assessment & Plan: Visit Diagnoses:    ICD-10-CM   1. Paresthesia of skin  R20.2 NCV with EMG (electromyography)    2. Right arm pain  M79.601     3. Weakness  R53.1     4. Chronic right shoulder pain  M25.511    G89.29        Plan: Impression: Complicated right shoulder and arm pain with paresthesias consistent somewhat with potential for radicular pathology versus focal nerve entrapment such as carpal tunnel syndrome.  She has chronic pain syndrome as well as chronic low back pain with MRI pending of the lumbar spine.  Electrodiagnostic study performed today.  The above electrodiagnostic study is ABNORMAL and reveals evidence of a moderate right median nerve entrapment at the wrist (carpal tunnel syndrome) affecting sensory and motor components.    There is no significant electrodiagnostic evidence of any other focal nerve entrapment, brachial plexopathy or cervical radiculopathy.    88As you know, this particular electrodiagnostic study cannot rule out chemical radiculitis or sensory only radiculopathy.   Recommendations: 1.  Follow-up with referring physician. 2.  Continue current management of symptoms. 3.  Continue use of resting splint at night-time and as needed during the day. 4.  Suggest surgical evaluation vs diagnostic  carpal tunnel injection.  Consider cervical MRI if felt to be radicular.  Meds & Orders: No orders of the defined types were placed in this encounter.   Orders Placed This Encounter  Procedures   NCV with EMG (electromyography)    Follow-up: Return for Fuller Canada, MD.   Procedures: No procedures performed  EMG & NCV Findings: Evaluation of the right median  motor nerve showed prolonged distal onset latency (4.4 ms) and decreased conduction velocity (Elbow-Wrist, 43 m/s).  The right median (across palm) sensory nerve showed prolonged distal peak latency (4.7 ms) and reduced amplitude (9.6 V).  All remaining nerves (as indicated in the following tables) were within normal limits.    All examined muscles (as indicated in the following table) showed no evidence of electrical instability.    Impression: The above electrodiagnostic study is ABNORMAL and reveals evidence of a moderate right median nerve entrapment at the wrist (carpal tunnel syndrome) affecting sensory and motor components.   There is no significant electrodiagnostic evidence of any other focal nerve entrapment, brachial plexopathy or cervical radiculopathy.   Recommendations: 1.  Follow-up with referring physician. 2.  Continue current management of symptoms. 3.  Continue use of resting splint at night-time and as needed during the day. 4.  Suggest surgical evaluation.  ___________________________ Naaman Plummer FAAPMR Board Certified, American Board of Physical Medicine and Rehabilitation    Nerve Conduction Studies Anti Sensory Summary Table   Stim Site NR Peak (ms) Norm Peak (ms) P-T Amp (V) Norm P-T Amp Site1 Site2 Delta-P (ms) Dist (cm) Vel (m/s) Norm Vel (m/s)  Right Median Acr Palm Anti Sensory (2nd Digit)  32.2C  Wrist    *4.7 <3.6 *9.6 >10        Right Radial Anti Sensory (Base 1st Digit)  32C  Wrist    2.3 <3.1 17.8  Wrist Base 1st Digit 2.3 0.0    Right Ulnar Anti Sensory (5th Digit)  32.4C  Wrist    3.3 <3.7 18.3 >15.0 Wrist 5th Digit 3.3 14.0 42 >38   Motor Summary Table   Stim Site NR Onset (ms) Norm Onset (ms) O-P Amp (mV) Norm O-P Amp Site1 Site2 Delta-0 (ms) Dist (cm) Vel (m/s) Norm Vel (m/s)  Right Median Motor (Abd Poll Brev)  32.2C  Wrist    *4.4 <4.2 6.5 >5 Elbow Wrist 4.9 21.0 *43 >50  Elbow    9.3  2.9         Right Ulnar Motor (Abd Dig Min)   32.2C  Wrist    2.7 <4.2 7.8 >3 B Elbow Wrist 3.7 20.0 54 >53  B Elbow    6.4  8.0  A Elbow B Elbow 1.6 10.0 63 >53  A Elbow    8.0  7.7          EMG   Side Muscle Nerve Root Ins Act Fibs Psw Amp Dur Poly Recrt Int Dennie Bible Comment  Right Abd Poll Brev Median C8-T1 Nml Nml Nml Nml Nml 0 Nml Nml   Right 1stDorInt Ulnar C8-T1 Nml Nml Nml Nml Nml 0 Nml Nml   Right PronatorTeres Median C6-7 Nml Nml Nml Nml Nml 0 Nml Nml   Right Biceps Musculocut C5-6 Nml Nml Nml Nml Nml 0 Nml Nml   Right Deltoid Axillary C5-6 Nml Nml Nml Nml Nml 0 Nml Nml     Nerve Conduction Studies Anti Sensory Left/Right Comparison   Stim Site L Lat (ms) R Lat (ms) L-R Lat (ms) L Amp (V) R  Amp (V) L-R Amp (%) Site1 Site2 L Vel (m/s) R Vel (m/s) L-R Vel (m/s)  Median Acr Palm Anti Sensory (2nd Digit)  32.2C  Wrist  *4.7   *9.6        Radial Anti Sensory (Base 1st Digit)  32C  Wrist  2.3   17.8  Wrist Base 1st Digit     Ulnar Anti Sensory (5th Digit)  32.4C  Wrist  3.3   18.3  Wrist 5th Digit  42    Motor Left/Right Comparison   Stim Site L Lat (ms) R Lat (ms) L-R Lat (ms) L Amp (mV) R Amp (mV) L-R Amp (%) Site1 Site2 L Vel (m/s) R Vel (m/s) L-R Vel (m/s)  Median Motor (Abd Poll Brev)  32.2C  Wrist  *4.4   6.5  Elbow Wrist  *43   Elbow  9.3   2.9        Ulnar Motor (Abd Dig Min)  32.2C  Wrist  2.7   7.8  B Elbow Wrist  54   B Elbow  6.4   8.0  A Elbow B Elbow  63   A Elbow  8.0   7.7           Waveforms:             Clinical History: No specialty comments available.   She reports that she has been smoking cigarettes. She started smoking about 37 years ago. She has a 18.9 pack-year smoking history. She has never used smokeless tobacco. No results for input(s): "HGBA1C", "LABURIC" in the last 8760 hours.  Objective:  VS:  HT:    WT:   BMI:     BP:   HR: bpm  TEMP: ( )  RESP:  Physical Exam Vitals and nursing note reviewed.  Constitutional:      General: She is not in acute distress.     Appearance: Normal appearance. She is well-developed. She is not ill-appearing.  HENT:     Head: Normocephalic and atraumatic.  Eyes:     Conjunctiva/sclera: Conjunctivae normal.     Pupils: Pupils are equal, round, and reactive to light.  Cardiovascular:     Rate and Rhythm: Normal rate.     Pulses: Normal pulses.  Pulmonary:     Effort: Pulmonary effort is normal.  Musculoskeletal:        General: Tenderness present. No swelling or deformity.     Cervical back: Tenderness present.     Right lower leg: No edema.     Left lower leg: No edema.     Comments: Inspection reveals no atrophy of the bilateral APB or FDI or hand intrinsics. There is no swelling, color changes, allodynia or dystrophic changes. There is 5 out of 5 strength in the bilateral wrist extension, finger abduction and long finger flexion. There is intact sensation to light touch in all dermatomal and peripheral nerve distributions.  There is a negative Tinel's test at the bilateral wrist and elbow. There is a positive Phalen's test on the right. There is a negative Hoffmann's test bilaterally.  Skin:    General: Skin is warm and dry.     Findings: No erythema or rash.  Neurological:     General: No focal deficit present.     Mental Status: She is alert and oriented to person, place, and time.     Cranial Nerves: No cranial nerve deficit.     Sensory: No sensory deficit.     Motor: No  weakness or abnormal muscle tone.     Coordination: Coordination normal.     Gait: Gait normal.  Psychiatric:        Mood and Affect: Mood normal.        Behavior: Behavior normal.     Ortho Exam  Imaging: No results found.  Past Medical/Family/Surgical/Social History: Medications & Allergies reviewed per EMR, new medications updated. Patient Active Problem List   Diagnosis Date Noted   Degenerative disc disease, lumbar 08/11/2023   Hospital discharge follow-up 07/29/2023   Laceration of spleen 07/24/2023   Ileus (HCC)  07/13/2023   Elevated LFTs 07/12/2023   Colonic obstruction (HCC) 07/12/2023   Hypothermia 07/09/2023   Hypokalemia 07/06/2023   Thrombocytopenia (HCC) 07/06/2023   Chronic back pain 05/18/2023   Bilateral knee pain 04/26/2023   Depression, recurrent (HCC) 04/26/2023   Alcohol use disorder 04/20/2023   Carpal tunnel syndrome of right wrist 04/20/2023   Tachycardia 04/20/2023   Transaminitis 04/06/2023   GERD (gastroesophageal reflux disease) 04/06/2023   Tobacco use disorder 04/06/2023   Alcohol withdrawal (HCC) 10/19/2022   Essential hypertension 10/19/2022   Nausea & vomiting 10/19/2022   Hypomagnesemia 10/19/2022   Brachial neuritis or radiculitis 10/29/2010   Past Medical History:  Diagnosis Date   Alcohol withdrawal (HCC)    Cervical radiculopathy    Malignant hyperthermia    Family History  Problem Relation Age of Onset   Stroke Father    Past Surgical History:  Procedure Laterality Date   FLEXIBLE SIGMOIDOSCOPY N/A 07/13/2023   Procedure: FLEXIBLE SIGMOIDOSCOPY;  Surgeon: Dolores Frame, MD;  Location: [facility];  Service: Gastroenterology;  Laterality: N/A;   Social History   Occupational History   Not on file  Tobacco Use   Smoking status: Every Day    Current packs/day: 0.50    Average packs/day: 0.5 packs/day for 37.8 years (18.9 ttl pk-yrs)    Types: Cigarettes    Start date: 68   Smokeless tobacco: Never  Vaping Use   Vaping status: Never Used  Substance and Sexual Activity   Alcohol use: Not Currently    Alcohol/week: 42.0 standard drinks of alcohol    Types: 42 Cans of beer per week    Comment: 6 pack of beer daily. No alcohol since 07/05/23.   Drug use: No   Sexual activity: Yes

## 2023-10-20 ENCOUNTER — Encounter: Payer: Self-pay | Admitting: *Deleted

## 2023-10-20 ENCOUNTER — Other Ambulatory Visit: Payer: Self-pay | Admitting: *Deleted

## 2023-10-20 ENCOUNTER — Encounter (HOSPITAL_COMMUNITY): Payer: Medicaid Other

## 2023-10-20 MED ORDER — PEG 3350-KCL-NA BICARB-NACL 420 G PO SOLR: 4000.0000 mL | Freq: Once | ORAL | 0 refills | Status: AC

## 2023-10-20 NOTE — Patient Instructions (Signed)

## 2023-10-20 NOTE — Progress Notes (Unsigned)
   Established Patient Office Visit   Subjective  Patient ID: Heidi [name], female    DOB: 15-Sep-1971  Age: 52 y.o. MRN: [id number]  No chief complaint on file.   She  has a past medical history of Alcohol withdrawal (HCC), Cervical radiculopathy, and Malignant hyperthermia.  HPI  ROS    Objective:     There were no vitals taken for this visit. {Vitals History (Optional):23777}  Physical Exam   No results found for any visits on 10/21/23.  The 10-year ASCVD risk score (Arnett DK, et al., 2019) is: 8.9%    Assessment & Plan:  There are no diagnoses linked to this encounter.  No follow-ups on file.   Cruzita Lederer Newman Nip, FNP

## 2023-10-21 ENCOUNTER — Ambulatory Visit: Payer: MEDICAID | Admitting: Family Medicine

## 2023-10-21 ENCOUNTER — Encounter: Payer: Self-pay | Admitting: *Deleted

## 2023-10-21 DIAGNOSIS — Z124 Encounter for screening for malignant neoplasm of cervix: Secondary | ICD-10-CM

## 2023-10-22 ENCOUNTER — Other Ambulatory Visit: Payer: Self-pay | Admitting: Orthopedic Surgery

## 2023-10-22 ENCOUNTER — Encounter (HOSPITAL_COMMUNITY): Payer: Self-pay

## 2023-10-22 ENCOUNTER — Telehealth (HOSPITAL_COMMUNITY): Payer: Self-pay

## 2023-10-22 NOTE — Telephone Encounter (Signed)
Called patient today for her 1st no show. Patient did not answer and PT left a voice message. Informed patient about the facility's policies on cancellation/no shows and that she can call if she has questions and if she needs to make future appointments.  Tish Frederickson. Ebelyn Bohnet, PT, DPT, OCS Board-Certified Clinical Specialist in Orthopedic PT PT Compact Privilege # ([place]): [phone] T

## 2023-10-25 NOTE — Progress Notes (Unsigned)
   Established Patient Office Visit   Subjective  Patient ID: Heidi [name], female    DOB: [birth year]  Age: 52 y.o. MRN: [id number]  No chief complaint on file.   She  has a past medical history of Alcohol withdrawal (HCC), Cervical radiculopathy, and Malignant hyperthermia.  HPI  ROS    Objective:     There were no vitals taken for this visit. {Vitals History (Optional):23777}  Physical Exam   No results found for any visits on 10/26/23.  The 10-year ASCVD risk score (Arnett DK, et al., 2019) is: 8.9%    Assessment & Plan:  There are no diagnoses linked to this encounter.  No follow-ups on file.   Cruzita Lederer Newman Nip, FNP

## 2023-10-25 NOTE — Patient Instructions (Signed)

## 2023-10-26 ENCOUNTER — Encounter: Payer: Self-pay | Admitting: Family Medicine

## 2023-10-26 ENCOUNTER — Other Ambulatory Visit (HOSPITAL_COMMUNITY)
Admission: RE | Admit: 2023-10-26 | Discharge: 2023-10-26 | Disposition: A | Discharge status: Home / Self Care | Payer: MEDICAID | Source: Ambulatory Visit | Attending: Family Medicine | Admitting: Family Medicine

## 2023-10-26 ENCOUNTER — Ambulatory Visit (INDEPENDENT_AMBULATORY_CARE_PROVIDER_SITE_OTHER): Payer: MEDICAID | Admitting: Family Medicine

## 2023-10-26 VITALS — BP 136/87 | HR 120 | Ht 63.0 in | Wt 178.0 lb

## 2023-10-26 DIAGNOSIS — Z124 Encounter for screening for malignant neoplasm of cervix: Secondary | ICD-10-CM | POA: Diagnosis not present

## 2023-10-26 DIAGNOSIS — G8929 Other chronic pain: Secondary | ICD-10-CM

## 2023-10-26 MED ORDER — ALBUTEROL SULFATE HFA 108 (90 BASE) MCG/ACT IN AERS: 2.0000 | INHALATION_SPRAY | Freq: Four times a day (QID) | RESPIRATORY_TRACT | 2 refills | Status: DC | PRN

## 2023-10-26 NOTE — Assessment & Plan Note (Signed)
Pap smear done, Patient tolerated procedure well. Informed patient I will keep them updated on results. Right breast normal without mass, skin or nipple changes or axillary nodes, left breast normal without mass, skin or nipple changes or axillary nodes. 

## 2023-10-27 ENCOUNTER — Telehealth: Payer: Self-pay | Admitting: Family Medicine

## 2023-10-27 NOTE — Telephone Encounter (Signed)
Patient came by office does not want to see [facility].  Would like to [facility]. Patient call back # [phone].

## 2023-10-28 ENCOUNTER — Other Ambulatory Visit: Payer: Self-pay | Admitting: Family Medicine

## 2023-10-28 ENCOUNTER — Telehealth: Payer: Self-pay

## 2023-10-28 DIAGNOSIS — Z742 Need for assistance at home and no other household member able to render care: Secondary | ICD-10-CM

## 2023-10-28 NOTE — Telephone Encounter (Signed)
No . She called in with the information. She wanted [facility].

## 2023-10-28 NOTE — Telephone Encounter (Signed)
Physician has sent referral and pt. Has been notified.

## 2023-10-28 NOTE — Telephone Encounter (Signed)
Referral sent 

## 2023-10-28 NOTE — Telephone Encounter (Signed)
Hey! Did she give you the brochure ? That she was talking about ?

## 2023-10-28 NOTE — Telephone Encounter (Signed)
Pt. Has been informed.

## 2023-11-01 ENCOUNTER — Ambulatory Visit (HOSPITAL_COMMUNITY): Payer: MEDICAID

## 2023-11-01 ENCOUNTER — Other Ambulatory Visit: Payer: Self-pay | Admitting: Family Medicine

## 2023-11-01 LAB — CYTOLOGY - PAP
Chlamydia: NEGATIVE
Comment: NEGATIVE
Comment: NEGATIVE
Comment: NEGATIVE
Comment: NORMAL
Diagnosis: NEGATIVE
HSV1: NEGATIVE
HSV2: NEGATIVE
Neisseria Gonorrhea: NEGATIVE
Trichomonas: NEGATIVE

## 2023-11-01 MED ORDER — FLUCONAZOLE 150 MG PO TABS
150.0000 mg | ORAL_TABLET | Freq: Once | ORAL | 0 refills | Status: AC
Start: 2023-11-01 — End: 2023-11-01

## 2023-11-01 NOTE — Progress Notes (Signed)
Please inform patient,  Pap smear results negative Repeat Pap smear will be in 3 years  Results are positive for a yeast infection. I have ordered a single 150 mg dose of Diflucan, which has been sent to your pharmacy. I encourage to wear loose, cotton clothing, practice good hygiene, and avoid scented products that can cause irritation. Adding probiotics and reducing sugar intake may also help maintain a healthy balance.

## 2023-11-04 ENCOUNTER — Other Ambulatory Visit: Payer: Self-pay | Admitting: Orthopedic Surgery

## 2023-11-04 ENCOUNTER — Ambulatory Visit: Payer: Medicaid Other | Admitting: Gastroenterology

## 2023-11-04 ENCOUNTER — Other Ambulatory Visit: Payer: Self-pay | Admitting: Family Medicine

## 2023-11-04 DIAGNOSIS — F40240 Claustrophobia: Secondary | ICD-10-CM

## 2023-11-04 NOTE — Telephone Encounter (Signed)
I sent this a few days ago

## 2023-11-05 NOTE — Telephone Encounter (Signed)
Thank you :)

## 2023-11-09 ENCOUNTER — Other Ambulatory Visit: Payer: Self-pay | Admitting: Family Medicine

## 2023-11-10 ENCOUNTER — Ambulatory Visit: Payer: MEDICAID | Admitting: Gastroenterology

## 2023-11-11 ENCOUNTER — Ambulatory Visit: Payer: Medicaid Other | Admitting: Family Medicine

## 2023-11-12 ENCOUNTER — Ambulatory Visit: Payer: MEDICAID | Admitting: Gastroenterology

## 2023-11-17 ENCOUNTER — Telehealth: Payer: Self-pay | Admitting: Orthopedic Surgery

## 2023-11-17 ENCOUNTER — Other Ambulatory Visit: Payer: Self-pay | Admitting: Orthopedic Surgery

## 2023-11-17 DIAGNOSIS — M48062 Spinal stenosis, lumbar region with neurogenic claudication: Secondary | ICD-10-CM

## 2023-11-17 MED ORDER — IBUPROFEN 800 MG PO TABS: 800.0000 mg | ORAL_TABLET | Freq: Three times a day (TID) | ORAL | 5 refills | Status: DC | PRN

## 2023-11-17 NOTE — Telephone Encounter (Signed)
Results given of MRI  Patient has spinal stenosis  Recommend referral to spine specialist that we will take [facility]

## 2023-11-21 ENCOUNTER — Other Ambulatory Visit: Payer: Self-pay | Admitting: Orthopedic Surgery

## 2023-11-21 DIAGNOSIS — F40240 Claustrophobia: Secondary | ICD-10-CM

## 2023-11-24 NOTE — Patient Instructions (Signed)
Heidi [name]  11/24/2023     @PREFPERIOPPHARMACY @   Your procedure is scheduled on  11/30/2023.   Report to [name] at  0830 A.M.    Call this number if you have problems the morning of surgery:  [phone]  If you experience any cold or flu symptoms such as cough, fever, chills, shortness of breath, etc. between now and your scheduled surgery, please notify us at the above number.   Remember:  Follow the diet and prep instructions given to you by the office.    You may drink clear liquids until 0630 am on 11/30/2023.   Clear liquids allowed are:                    Water, Juice (No red color; non-citric and without pulp; diabetics please choose diet or no sugar options), Carbonated beverages (diabetics please choose diet or no sugar options), Clear Tea (No creamer, milk, or cream, including half & half and powdered creamer), Black Coffee Only (No creamer, milk or cream, including half & half and powdered creamer), and Clear Sports drink (No red color; diabetics please choose diet or no sugar options)    Take these medicines the morning of surgery with A SIP OF WATER          citalopram, clonidine, cyclobenzaprine, diazepam (if needed), diltiazem, gabapentin, indomethacin, metoprolol, naltrexone, pantoprazole, prednisone.     Do not wear jewelry, make-up or nail polish, including gel polish,  artificial nails, or any other type of covering on natural nails (fingers and  toes).  Do not wear lotions, powders, or perfumes, or deodorant.  Do not shave 48 hours prior to surgery.  Men may shave face and neck.  Do not bring valuables to the hospital.  [facility] is not responsible for any belongings or valuables.  Contacts, dentures or bridgework may not be worn into surgery.  Leave your suitcase in the car.  After surgery it may be brought to your room.  For patients admitted to the hospital, discharge time will be determined by your treatment team.  Patients  discharged the day of surgery will not be allowed to drive home and must have someone with them for 24 hours.    Special instructions:   DO NOT smoke tobacco or vape for 24 hours before your procedure.  Please read over the following fact sheets that you were given. Anesthesia Post-op Instructions and Care and Recovery After Surgery      Upper Endoscopy, Adult, Care After After the procedure, it is common to have a sore throat. It is also common to have: Mild stomach pain or discomfort. Bloating. Nausea. Follow these instructions at home: The instructions below may help you care for yourself at home. Your health care provider may give you more instructions. If you have questions, ask your health care provider. If you were given a sedative during the procedure, it can affect you for several hours. Do not drive or operate machinery until your health care provider says that it is safe. If you will be going home right after the procedure, plan to have a responsible adult: Take you home from the hospital or clinic. You will not be allowed to drive. Care for you for the time you are told. Follow instructions from your health care provider about what you may eat and drink. Return to your normal activities as told by your health care provider. Ask your health care provider  what activities are safe for you. Take over-the-counter and prescription medicines only as told by your health care provider. Contact a health care provider if you: Have a sore throat that lasts longer than one day. Have trouble swallowing. Have a fever. Get help right away if you: Vomit blood or your vomit looks like coffee grounds. Have bloody, black, or tarry stools. Have a very bad sore throat or you cannot swallow. Have difficulty breathing or very bad pain in your chest or abdomen. These symptoms may be an emergency. Get help right away. Call 911. Do not wait to see if the symptoms will go away. Do not drive  yourself to the hospital. Summary After the procedure, it is common to have a sore throat, mild stomach discomfort, bloating, and nausea. If you were given a sedative during the procedure, it can affect you for several hours. Do not drive until your health care provider says that it is safe. Follow instructions from your health care provider about what you may eat and drink. Return to your normal activities as told by your health care provider. This information is not intended to replace advice given to you by your health care provider. Make sure you discuss any questions you have with your health care provider. Document Revised: 03/18/2022 Document Reviewed: 03/18/2022 Elsevier Patient Education  2024 [employer] Colonoscopy, Adult, Care After The following information offers guidance on how to care for yourself after your procedure. Your health care provider may also give you more specific instructions. If you have problems or questions, contact your health care provider. What can I expect after the procedure? After the procedure, it is common to have: A small amount of blood in your stool for 24 hours after the procedure. Some gas. Mild cramping or bloating of your abdomen. Follow these instructions at home: Eating and drinking  Drink enough fluid to keep your urine pale yellow. Follow instructions from your health care provider about eating or drinking restrictions. Resume your normal diet as told by your health care provider. Avoid heavy or fried foods that are hard to digest. Activity Rest as told by your health care provider. Avoid sitting for a long time without moving. Get up to take short walks every 1-2 hours. This is important to improve blood flow and breathing. Ask for help if you feel weak or unsteady. Return to your normal activities as told by your health care provider. Ask your health care provider what activities are safe for you. Managing cramping and bloating  Try  walking around when you have cramps or feel bloated. If directed, apply heat to your abdomen as told by your health care provider. Use the heat source that your health care provider recommends, such as a moist heat pack or a heating pad. Place a towel between your skin and the heat source. Leave the heat on for 20-30 minutes. Remove the heat if your skin turns bright red. This is especially important if you are unable to feel pain, heat, or cold. You have a greater risk of getting burned. General instructions If you were given a sedative during the procedure, it can affect you for several hours. Do not drive or operate machinery until your health care provider says that it is safe. For the first 24 hours after the procedure: Do not sign important documents. Do not drink alcohol. Do your regular daily activities at a slower pace than normal. Eat soft foods that are easy to digest. Take over-the-counter and prescription medicines  only as told by your health care provider. Keep all follow-up visits. This is important. Contact a health care provider if: You have blood in your stool 2-3 days after the procedure. Get help right away if: You have more than a small spotting of blood in your stool. You have large blood clots in your stool. You have swelling of your abdomen. You have nausea or vomiting. You have a fever. You have increasing pain in your abdomen that is not relieved with medicine. These symptoms may be an emergency. Get help right away. Call 911. Do not wait to see if the symptoms will go away. Do not drive yourself to the hospital. Summary After the procedure, it is common to have a small amount of blood in your stool. You may also have mild cramping and bloating of your abdomen. If you were given a sedative during the procedure, it can affect you for several hours. Do not drive or operate machinery until your health care provider says that it is safe. Get help right away if you  have a lot of blood in your stool, nausea or vomiting, a fever, or increased pain in your abdomen. This information is not intended to replace advice given to you by your health care provider. Make sure you discuss any questions you have with your health care provider. Document Revised: 01/19/2023 Document Reviewed: 07/30/2021 Elsevier Patient Education  2024 Else[employer] Inc. Monitored Anesthesia Care, Care After The following information offers guidance on how to care for yourself after your procedure. Your health care provider may also give you more specific instructions. If you have problems or questions, contact your health care provider. What can I expect after the procedure? After the procedure, it is common to have: Tiredness. Little or no memory about what happened during or after the procedure. Impaired judgment when it comes to making decisions. Nausea or vomiting. Some trouble with balance. Follow these instructions at home: For the time period you were told by your health care provider:  Rest. Do not participate in activities where you could fall or become injured. Do not drive or use machinery. Do not drink alcohol. Do not take sleeping pills or medicines that cause drowsiness. Do not make important decisions or sign legal documents. Do not take care of children on your own. Medicines Take over-the-counter and prescription medicines only as told by your health care provider. If you were prescribed antibiotics, take them as told by your health care provider. Do not stop using the antibiotic even if you start to feel better. Eating and drinking Follow instructions from your health care provider about what you may eat and drink. Drink enough fluid to keep your urine pale yellow. If you vomit: Drink clear fluids slowly and in small amounts as you are able. Clear fluids include water, ice chips, low-calorie sports drinks, and fruit juice that has water added to it (diluted fruit  juice). Eat light and bland foods in small amounts as you are able. These foods include bananas, applesauce, rice, lean meats, toast, and crackers. General instructions  Have a responsible adult stay with you for the time you are told. It is important to have someone help care for you until you are awake and alert. If you have sleep apnea, surgery and some medicines can increase your risk for breathing problems. Follow instructions from your health care provider about wearing your sleep device: When you are sleeping. This includes during daytime naps. While taking prescription pain medicines, sleeping medicines, or  medicines that make you drowsy. Do not use any products that contain nicotine or tobacco. These products include cigarettes, chewing tobacco, and vaping devices, such as e-cigarettes. If you need help quitting, ask your health care provider. Contact a health care provider if: You feel nauseous or vomit every time you eat or drink. You feel light-headed. You are still sleepy or having trouble with balance after 24 hours. You get a rash. You have a fever. You have redness or swelling around the IV site. Get help right away if: You have trouble breathing. You have new confusion after you get home. These symptoms may be an emergency. Get help right away. Call 911. Do not wait to see if the symptoms will go away. Do not drive yourself to the hospital. This information is not intended to replace advice given to you by your health care provider. Make sure you discuss any questions you have with your health care provider. Document Revised: 05/04/2022 Document Reviewed: 05/04/2022 Elsevier Patient Education  2024 [employer].

## 2023-11-25 ENCOUNTER — Encounter (HOSPITAL_COMMUNITY): Payer: Self-pay

## 2023-11-25 ENCOUNTER — Encounter (HOSPITAL_COMMUNITY)
Admission: RE | Admit: 2023-11-25 | Discharge: 2023-11-25 | Disposition: A | Discharge status: Home / Self Care | Payer: MEDICAID | Source: Ambulatory Visit | Attending: Internal Medicine | Admitting: Internal Medicine

## 2023-11-25 ENCOUNTER — Other Ambulatory Visit: Payer: Self-pay | Admitting: Family Medicine

## 2023-11-25 ENCOUNTER — Other Ambulatory Visit: Payer: Self-pay

## 2023-11-25 VITALS — BP 111/71 | HR 104 | Temp 97.8°F | Resp 18 | Ht 63.0 in | Wt 178.1 lb

## 2023-11-25 DIAGNOSIS — Z01818 Encounter for other preprocedural examination: Secondary | ICD-10-CM | POA: Insufficient documentation

## 2023-11-25 HISTORY — DX: Anxiety disorder, unspecified: F41.9

## 2023-11-25 HISTORY — DX: Depression, unspecified: F32.A

## 2023-11-25 HISTORY — DX: Gastro-esophageal reflux disease without esophagitis: K21.9

## 2023-11-25 LAB — POCT PREGNANCY, URINE: Preg Test, Ur: NEGATIVE

## 2023-11-25 NOTE — Telephone Encounter (Signed)
Sent this two weeks ago

## 2023-11-30 ENCOUNTER — Other Ambulatory Visit: Payer: Self-pay

## 2023-11-30 ENCOUNTER — Ambulatory Visit (HOSPITAL_COMMUNITY): Payer: MEDICAID | Admitting: Anesthesiology

## 2023-11-30 ENCOUNTER — Encounter (HOSPITAL_COMMUNITY): Payer: Self-pay

## 2023-11-30 ENCOUNTER — Ambulatory Visit (HOSPITAL_COMMUNITY)
Admission: RE | Admit: 2023-11-30 | Discharge: 2023-11-30 | Disposition: A | Discharge status: Home / Self Care | Payer: MEDICAID | Attending: Internal Medicine | Admitting: Internal Medicine

## 2023-11-30 ENCOUNTER — Encounter (HOSPITAL_COMMUNITY)
Admission: RE | Disposition: A | Discharge status: Home / Self Care | Payer: Self-pay | Source: Home / Self Care | Attending: Internal Medicine

## 2023-11-30 DIAGNOSIS — K219 Gastro-esophageal reflux disease without esophagitis: Secondary | ICD-10-CM | POA: Insufficient documentation

## 2023-11-30 DIAGNOSIS — D649 Anemia, unspecified: Secondary | ICD-10-CM | POA: Diagnosis not present

## 2023-11-30 DIAGNOSIS — R1084 Generalized abdominal pain: Secondary | ICD-10-CM | POA: Diagnosis present

## 2023-11-30 DIAGNOSIS — K3189 Other diseases of stomach and duodenum: Secondary | ICD-10-CM

## 2023-11-30 DIAGNOSIS — D126 Benign neoplasm of colon, unspecified: Secondary | ICD-10-CM | POA: Diagnosis not present

## 2023-11-30 DIAGNOSIS — K2980 Duodenitis without bleeding: Secondary | ICD-10-CM | POA: Diagnosis not present

## 2023-11-30 DIAGNOSIS — Z01818 Encounter for other preprocedural examination: Secondary | ICD-10-CM

## 2023-11-30 DIAGNOSIS — K2970 Gastritis, unspecified, without bleeding: Secondary | ICD-10-CM | POA: Insufficient documentation

## 2023-11-30 DIAGNOSIS — D127 Benign neoplasm of rectosigmoid junction: Secondary | ICD-10-CM | POA: Diagnosis not present

## 2023-11-30 DIAGNOSIS — F17200 Nicotine dependence, unspecified, uncomplicated: Secondary | ICD-10-CM | POA: Insufficient documentation

## 2023-11-30 DIAGNOSIS — K449 Diaphragmatic hernia without obstruction or gangrene: Secondary | ICD-10-CM | POA: Insufficient documentation

## 2023-11-30 DIAGNOSIS — I10 Essential (primary) hypertension: Secondary | ICD-10-CM | POA: Diagnosis not present

## 2023-11-30 DIAGNOSIS — R6881 Early satiety: Secondary | ICD-10-CM

## 2023-11-30 DIAGNOSIS — D125 Benign neoplasm of sigmoid colon: Secondary | ICD-10-CM | POA: Diagnosis not present

## 2023-11-30 HISTORY — PX: BIOPSY: SHX5522

## 2023-11-30 HISTORY — PX: COLONOSCOPY WITH PROPOFOL: SHX5780

## 2023-11-30 HISTORY — PX: ESOPHAGOGASTRODUODENOSCOPY (EGD) WITH PROPOFOL: SHX5813

## 2023-11-30 HISTORY — PX: POLYPECTOMY: SHX5525

## 2023-11-30 SURGERY — COLONOSCOPY WITH PROPOFOL
Anesthesia: General

## 2023-11-30 MED ORDER — LIDOCAINE HCL (CARDIAC) PF 100 MG/5ML IV SOSY
PREFILLED_SYRINGE | INTRAVENOUS | Status: DC | PRN
  Administered 2023-11-30: 60 mg via INTRAVENOUS

## 2023-11-30 MED ORDER — SODIUM CHLORIDE 0.9 % IV SOLN: INTRAVENOUS | Status: DC | PRN

## 2023-11-30 MED ORDER — PROPOFOL 500 MG/50ML IV EMUL
INTRAVENOUS | Status: DC | PRN
  Administered 2023-11-30: 100 mg via INTRAVENOUS
  Administered 2023-11-30: 100 ug/kg/min via INTRAVENOUS

## 2023-11-30 MED ORDER — LACTATED RINGERS IV SOLN: INTRAVENOUS | Status: DC

## 2023-11-30 MED ORDER — STERILE WATER FOR IRRIGATION IR SOLN
Status: DC | PRN
  Administered 2023-11-30: 60 mL

## 2023-11-30 MED ORDER — PANTOPRAZOLE SODIUM 40 MG PO TBEC: 40.0000 mg | DELAYED_RELEASE_TABLET | Freq: Two times a day (BID) | ORAL | 11 refills | Status: DC

## 2023-11-30 NOTE — Op Note (Signed)
[facility] Patient Name: Heidi [name] Procedure Date: 11/30/2023 10:15 AM MRN: [id number] Date of Birth: 1971-04-09 Attending MD: Hennie Duos. Marletta Lor , [place], [id number] CSN: [id number] Age: 52 Admit Type: Inpatient Procedure:                Colonoscopy Indications:              Generalized abdominal pain, anemia Providers:                Hennie Duos. Marletta Lor, DO, Nena Polio, RN, Francoise Ceo                            RN, RN, Dyann Ruddle Referring MD:              Medicines:                See the Anesthesia note for documentation of the                            administered medications Complications:            No immediate complications. Estimated Blood Loss:     Estimated blood loss was minimal. Procedure:                Pre-Anesthesia Assessment:                           - The anesthesia plan was to use monitored                            anesthesia care (MAC).                           After obtaining informed consent, the colonoscope                            was passed under direct vision. Throughout the                            procedure, the patient's blood pressure, pulse, and                            oxygen saturations were monitored continuously. The                            [id number]-HQ190L ([id number]) scope was introduced through                            the anus and advanced to the the cecum, identified                            by appendiceal orifice and ileocecal valve. The                            colonoscopy was performed without difficulty. The                            patient tolerated the  procedure well. The quality                            of the bowel preparation was evaluated using the                            BBPS [facility] Bowel Preparation Scale) with scores                            of: Right Colon = 2 (minor amount of residual                            staining, small fragments of stool and/or opaque                            liquid, but  mucosa seen well), Transverse Colon = 2                            (minor amount of residual staining, small fragments                            of stool and/or opaque liquid, but mucosa seen                            well) and Left Colon = 2 (minor amount of residual                            staining, small fragments of stool and/or opaque                            liquid, but mucosa seen well). The total BBPS score                            equals 6. The quality of the bowel preparation was                            good. Scope In: 10:33:49 AM Scope Out: 10:54:28 AM Scope Withdrawal Time: 0 hours 17 minutes 47 seconds  Total Procedure Duration: 0 hours 20 minutes 39 seconds  Findings:      A 5 mm polyp was found in the recto-sigmoid colon. The polyp was       sessile. The polyp was removed with a cold snare. Resection and       retrieval were complete.      A large amount of semi-liquid stool was found in the entire colon,       making visualization difficult. Lavage of the area was performed using       copious amounts of sterile water, resulting in clearance with good       visualization.      The exam was otherwise without abnormality. Impression:               - One 5 mm polyp at the recto-sigmoid colon,  removed with a cold snare. Resected and retrieved.                           - Stool in the entire examined colon.                           - The examination was otherwise normal. Moderate Sedation:      Per Anesthesia Care Recommendation:           - Patient has a contact number available for                            emergencies. The signs and symptoms of potential                            delayed complications were discussed with the                            patient. Return to normal activities tomorrow.                            Written discharge instructions were provided to the                            patient.                            - Resume previous diet.                           - Continue present medications.                           - Await pathology results.                           - Repeat colonoscopy in 5 years for surveillance                            and borderline prep today                           - Return to GI clinic in 6 weeks. 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 All rights reserved. The codes documented in this report are preliminary and upon coder review may  be revised to meet current compliance requirements. Hennie Duos. Marletta Lor, DO Hennie Duos. Sayre Witherington, DO 11/30/2023 10:59:14 AM This report has been signed electronically. Number of Addenda: 0

## 2023-11-30 NOTE — Anesthesia Preprocedure Evaluation (Signed)
Anesthesia Evaluation  Patient identified by MRN, date of birth, ID band Patient awake    Reviewed: Allergy & Precautions, H&P , NPO status , Patient's Chart, lab work & pertinent test results, reviewed documented beta blocker date and time   Airway Mallampati: II  TM Distance: >3 FB Neck ROM: full    Dental no notable dental hx.    Pulmonary neg pulmonary ROS, Current Smoker   Pulmonary exam normal breath sounds clear to auscultation       Cardiovascular Exercise Tolerance: Good hypertension,  Rhythm:regular Rate:Normal     Neuro/Psych  PSYCHIATRIC DISORDERS Anxiety Depression     Neuromuscular disease    GI/Hepatic Neg liver ROS,GERD  ,,  Endo/Other  negative endocrine ROS    Renal/GU negative Renal ROS  negative genitourinary   Musculoskeletal   Abdominal   Peds  Hematology negative hematology ROS (+)   Anesthesia Other Findings   Reproductive/Obstetrics negative OB ROS                             Anesthesia Physical Anesthesia Plan  ASA: 2  Anesthesia Plan: General   Post-op Pain Management:    Induction:   PONV Risk Score and Plan: Propofol infusion  Airway Management Planned:   Additional Equipment:   Intra-op Plan:   Post-operative Plan:   Informed Consent: I have reviewed the patients History and Physical, chart, labs and discussed the procedure including the risks, benefits and alternatives for the proposed anesthesia with the patient or authorized representative who has indicated his/her understanding and acceptance.     Dental Advisory Given  Plan Discussed with: CRNA  Anesthesia Plan Comments:        Anesthesia Quick Evaluation

## 2023-11-30 NOTE — Transfer of Care (Signed)
Immediate Anesthesia Transfer of Care Note  Patient: Heidi [name]  Procedure(s) Performed: COLONOSCOPY WITH PROPOFOL ESOPHAGOGASTRODUODENOSCOPY (EGD) WITH PROPOFOL BIOPSY POLYPECTOMY  Patient Location: Short Stay  Anesthesia Type:General  Level of Consciousness: awake  Airway & Oxygen Therapy: Patient Spontanous Breathing  Post-op Assessment: Report given to RN and Post -op Vital signs reviewed and stable  Post vital signs: Reviewed and stable  Last Vitals:  Vitals Value Taken Time  BP 127/92   Temp 36.6   Pulse 96   Resp 19   SpO2 99%     Last Pain:  Vitals:   11/30/23 1100  TempSrc: Oral  PainSc: 0-No pain      Patients Stated Pain Goal: 5 (11/30/23 0947)  Complications: No notable events documented.

## 2023-11-30 NOTE — Discharge Instructions (Addendum)
EGD Discharge instructions Please read the instructions outlined below and refer to this sheet in the next few weeks. These discharge instructions provide you with general information on caring for yourself after you leave the hospital. Your doctor may also give you specific instructions. While your treatment has been planned according to the most current medical practices available, unavoidable complications occasionally occur. If you have any problems or questions after discharge, please call your doctor. ACTIVITY You may resume your regular activity but move at a slower pace for the next 24 hours.  Take frequent rest periods for the next 24 hours.  Walking will help expel (get rid of) the air and reduce the bloated feeling in your abdomen.  No driving for 24 hours (because of the anesthesia (medicine) used during the test).  You may shower.  Do not sign any important legal documents or operate any machinery for 24 hours (because of the anesthesia used during the test).  NUTRITION Drink plenty of fluids.  You may resume your normal diet.  Begin with a light meal and progress to your normal diet.  Avoid alcoholic beverages for 24 hours or as instructed by your caregiver.  MEDICATIONS You may resume your normal medications unless your caregiver tells you otherwise.  WHAT YOU CAN EXPECT TODAY You may experience abdominal discomfort such as a feeling of fullness or "gas" pains.  FOLLOW-UP Your doctor will discuss the results of your test with you.  SEEK IMMEDIATE MEDICAL ATTENTION IF ANY OF THE FOLLOWING OCCUR: Excessive nausea (feeling sick to your stomach) and/or vomiting.  Severe abdominal pain and distention (swelling).  Trouble swallowing.  Temperature over 101 F (37.8 C).  Rectal bleeding or vomiting of blood.    Colonoscopy Discharge Instructions  Read the instructions outlined below and refer to this sheet in the next few weeks. These discharge instructions provide you with  general information on caring for yourself after you leave the hospital. Your doctor may also give you specific instructions. While your treatment has been planned according to the most current medical practices available, unavoidable complications occasionally occur.   ACTIVITY You may resume your regular activity, but move at a slower pace for the next 24 hours.  Take frequent rest periods for the next 24 hours.  Walking will help get rid of the air and reduce the bloated feeling in your belly (abdomen).  No driving for 24 hours (because of the medicine (anesthesia) used during the test).   Do not sign any important legal documents or operate any machinery for 24 hours (because of the anesthesia used during the test).  NUTRITION Drink plenty of fluids.  You may resume your normal diet as instructed by your doctor.  Begin with a light meal and progress to your normal diet. Heavy or fried foods are harder to digest and may make you feel sick to your stomach (nauseated).  Avoid alcoholic beverages for 24 hours or as instructed.  MEDICATIONS You may resume your normal medications unless your doctor tells you otherwise.  WHAT YOU CAN EXPECT TODAY Some feelings of bloating in the abdomen.  Passage of more gas than usual.  Spotting of blood in your stool or on the toilet paper.  IF YOU HAD POLYPS REMOVED DURING THE COLONOSCOPY: No aspirin products for 7 days or as instructed.  No alcohol for 7 days or as instructed.  Eat a soft diet for the next 24 hours.  FINDING OUT THE RESULTS OF YOUR TEST Not all test results are available  during your visit. If your test results are not back during the visit, make an appointment with your caregiver to find out the results. Do not assume everything is normal if you have not heard from your caregiver or the medical facility. It is important for you to follow up on all of your test results.  SEEK IMMEDIATE MEDICAL ATTENTION IF: You have more than a spotting of  blood in your stool.  Your belly is swollen (abdominal distention).  You are nauseated or vomiting.  You have a temperature over 101.  You have abdominal pain or discomfort that is severe or gets worse throughout the day.   Your EGD revealed moderate amount inflammation in your stomach and small bowel.  I took biopsies of this to rule out infection with a bacteria called H. pylori.  Small hiatal hernia seen as well.  Otherwise unremarkable.  I am going to increase your pantoprazole twice daily.  I sent this to your pharmacy.  Your colonoscopy revealed 1 polyp(s) which I removed successfully. Await pathology results, my office will contact you. I recommend repeating colonoscopy in 5 years for surveillance purposes.   Follow-up in GI office in 6 weeks.   I hope you have a great rest of your week!  Hennie Duos. Marletta Lor, D.O. Gastroenterology and Hepatology [facility]

## 2023-11-30 NOTE — H&P (Signed)
Primary Care Physician:  Rica Records, FNP Primary Gastroenterologist:  Dr. Marletta Lor  Pre-Procedure History & Physical: HPI:  Heidi [name] is a 52 y.o. female is here for an EGD and colonoscopy to be performed for anemia Past Medical History:  Diagnosis Date   Alcohol withdrawal (HCC)    Anxiety    Cervical radiculopathy    Depression    GERD (gastroesophageal reflux disease)    Hypertension     Past Surgical History:  Procedure Laterality Date   FLEXIBLE SIGMOIDOSCOPY N/A 07/13/2023   Procedure: FLEXIBLE SIGMOIDOSCOPY;  Surgeon: Dolores Frame, MD;  Location: [facility];  Service: Gastroenterology;  Laterality: N/A;    Prior to Admission medications   Medication Sig Start Date End Date Taking? Authorizing Provider  citalopram (CELEXA) 20 MG tablet Take 20 mg by mouth daily. 08/04/23  Yes [provider]  cloNIDine (CATAPRES) 0.1 MG tablet TAKE ONE TABLET BY MOUTH EVERY DAY 11/09/23  Yes Del Newman Nip, [place], FNP  diltiazem (CARDIZEM CD) 120 MG 24 hr capsule Take 1 capsule (120 mg total) by mouth daily. 04/16/23  Yes Vassie Loll, MD  ferrous sulfate 325 (65 FE) MG tablet Take 325 mg by mouth daily with breakfast.   Yes [provider]  folic acid (FOLVITE) 1 MG tablet Take 1 tablet (1 mg total) by mouth daily. 07/25/23  Yes Vassie Loll, MD  gabapentin (NEURONTIN) 400 MG capsule Take 1 capsule (400 mg total) by mouth 3 (three) times daily. 10/11/23  Yes Vickki Hearing, MD  ibuprofen (ADVIL) 800 MG tablet Take 1 tablet (800 mg total) by mouth every 8 (eight) hours as needed. 11/17/23  Yes Vickki Hearing, MD  indomethacin (INDOCIN) 25 MG capsule Take 1 capsule (25 mg total) by mouth 2 (two) times daily with a meal. 09/03/23  Yes Vickki Hearing, MD  metoprolol tartrate (LOPRESSOR) 25 MG tablet Take 0.5 tablets (12.5 mg total) by mouth 2 (two) times daily. 07/24/23  Yes Vassie Loll, MD  naltrexone (DEPADE) 50 MG tablet  Take 1 tablet (50 mg total) by mouth daily. 09/23/23  Yes Del Newman Nip, Tenna Child, FNP  nicotine (NICODERM CQ - DOSED IN MG/24 HOURS) 14 mg/24hr patch 14 mg daily. 08/09/23  Yes [provider]  Omega-3 Fatty Acids (FISH OIL) 1000 MG CAPS Take 1 capsule (1,000 mg total) by mouth 2 (two) times daily. 09/27/23  Yes Del Newman Nip, Tenna Child, FNP  pantoprazole (PROTONIX) 40 MG tablet Take 1 tablet (40 mg total) by mouth daily before breakfast. 08/05/23  Yes Letta Median, PA-C  predniSONE (DELTASONE) 10 MG tablet Take 1 tablet (10 mg total) by mouth 3 (three) times daily. 10/01/23  Yes Vickki Hearing, MD  rosuvastatin (CRESTOR) 40 MG tablet Take 1 tablet (40 mg total) by mouth daily. 09/27/23  Yes Del Newman Nip, Tenna Child, FNP  tiZANidine (ZANAFLEX) 4 MG capsule Take 1 capsule (4 mg total) by mouth 2 (two) times daily as needed for muscle spasms. 10/01/23  Yes Vickki Hearing, MD  traZODone (DESYREL) 150 MG tablet Take 150 mg by mouth at bedtime. 08/04/23  Yes [provider]  albuterol (VENTOLIN HFA) 108 (90 Base) MCG/ACT inhaler Inhale 2 puffs into the lungs every 6 (six) hours as needed for wheezing or shortness of breath. 10/26/23   Del Nigel Berthold, FNP  ALPRAZolam (XANAX) 0.25 MG tablet Take 1 tablet (0.25 mg total) by mouth at bedtime as needed for anxiety. 10/13/23   Fuller Canada  E, MD  cyclobenzaprine (FLEXERIL) 5 MG tablet TAKE 1 TABLET BY MOUTH (3) TIMES A DAY AS NEEDED FOR MUSCLE SPASMS. 11/05/23   Del Nigel Berthold, FNP  diazepam (VALIUM) 5 MG tablet Take 5 mg by mouth as needed. 09/09/23   [provider]  lidocaine (LIDODERM) 5 % Place 1 patch onto the skin daily. Remove & Discard patch within 12 hours or as directed by MD 08/11/23   Rica Records, FNP    Allergies as of 10/20/2023   (No Known Allergies)    Family History  Problem Relation Age of Onset   Stroke Father     Social History   Socioeconomic History    Marital status: Widowed    Spouse name: Not on file   Number of children: Not on file   Years of education: Not on file   Highest education level: Not on file  Occupational History   Not on file  Tobacco Use   Smoking status: Every Day    Current packs/day: 0.50    Average packs/day: 0.5 packs/day for 37.9 years (19.0 ttl pk-yrs)    Types: Cigarettes    Start date: 71   Smokeless tobacco: Never  Vaping Use   Vaping status: Never Used  Substance and Sexual Activity   Alcohol use: Not Currently    Alcohol/week: 42.0 standard drinks of alcohol    Types: 42 Cans of beer per week    Comment: 6 pack of beer daily. No alcohol since 05/05/23.   Drug use: No   Sexual activity: Yes  Other Topics Concern   Not on file  Social History Narrative   Not on file   Social Determinants of Health   Financial Resource Strain: Not on file  Food Insecurity: No Food Insecurity (07/05/2023)   Hunger Vital Sign    Worried About Running Out of Food in the Last Year: Never true    Ran Out of Food in the Last Year: Never true  Transportation Needs: No Transportation Needs (07/05/2023)   PRAPARE - Administrator, Civil Service (Medical): No    Lack of Transportation (Non-Medical): No  Physical Activity: Not on file  Stress: Not on file  Social Connections: Not on file  Intimate Partner Violence: Not At Risk (07/05/2023)   Humiliation, Afraid, Rape, and Kick questionnaire    Fear of Current or Ex-Partner: No    Emotionally Abused: No    Physically Abused: No    Sexually Abused: No    Review of Systems: General: Negative for fever, chills, fatigue, weakness. Eyes: Negative for vision changes.  ENT: Negative for hoarseness, difficulty swallowing , nasal congestion. CV: Negative for chest pain, angina, palpitations, dyspnea on exertion, peripheral edema.  Respiratory: Negative for dyspnea at rest, dyspnea on exertion, cough, sputum, wheezing.  GI: See history of present illness. GU:   Negative for dysuria, hematuria, urinary incontinence, urinary frequency, nocturnal urination.  MS: Negative for joint pain, low back pain.  Derm: Negative for rash or itching.  Neuro: Negative for weakness, abnormal sensation, seizure, frequent headaches, memory loss, confusion.  Psych: Negative for anxiety, depression Endo: Negative for unusual weight change.  Heme: Negative for bruising or bleeding. Allergy: Negative for rash or hives.  Physical Exam: Vital signs in last 24 hours: Temp:  [98.7 F (37.1 C)] 98.7 F (37.1 C) (12/10 0947) Pulse Rate:  [82] 82 (12/10 0947) Resp:  [19] 19 (12/10 0947) BP: (127)/(94) 127/94 (12/10 0947) SpO2:  [99 %]  99 % (12/10 0947) Weight:  [80.8 kg] 80.8 kg (12/10 0947)   General:   Alert,  Well-developed, well-nourished, pleasant and cooperative in NAD Head:  Normocephalic and atraumatic. Eyes:  Sclera clear, no icterus.   Conjunctiva pink. Ears:  Normal auditory acuity. Nose:  No deformity, discharge,  or lesions. Msk:  Symmetrical without gross deformities. Normal posture. Extremities:  Without clubbing or edema. Neurologic:  Alert and  oriented x4;  grossly normal neurologically. Skin:  Intact without significant lesions or rashes. Psych:  Alert and cooperative. Normal mood and affect.   Impression/Plan: PRUDY FRATELLO is here for an EGD and colonoscopy to be performed for anemia  Risks, benefits, limitations, imponderables and alternatives regarding procedure have been reviewed with the patient. Questions have been answered. All parties agreeable.

## 2023-11-30 NOTE — Op Note (Signed)
[facility] Patient Name: Heidi [name] Procedure Date: 11/30/2023 10:16 AM MRN: [id number] Date of Birth: [birth year] Attending MD: Hennie Duos. Marletta Lor , [place], [id number] CSN: [id number] Age: 52 Admit Type: Outpatient Procedure:                Upper GI endoscopy Indications:              Generalized abdominal pain, anemia Providers:                Hennie Duos. Marletta Lor, DO, Nena Polio, RN, Francoise Ceo                            RN, RN, Dyann Ruddle Referring MD:              Medicines:                See the Anesthesia note for documentation of the                            administered medications Complications:            No immediate complications. Estimated Blood Loss:     Estimated blood loss was minimal. Procedure:                Pre-Anesthesia Assessment:                           - The anesthesia plan was to use monitored                            anesthesia care (MAC).                           After obtaining informed consent, the endoscope was                            passed under direct vision. Throughout the                            procedure, the patient's blood pressure, pulse, and                            oxygen saturations were monitored continuously. The                            [id number]-H190 ([id number]) scope was introduced through the                            mouth, and advanced to the second part of duodenum.                            The upper GI endoscopy was accomplished without                            difficulty. The patient tolerated the procedure  well. Scope In: 10:26:02 AM Scope Out: 10:30:07 AM Total Procedure Duration: 0 hours 4 minutes 5 seconds  Findings:      A small hiatal hernia was present.      Localized moderate inflammation characterized by erosions and erythema       was found in the gastric antrum. Biopsies were taken with a cold forceps       for Helicobacter pylori testing.      Localized moderate  inflammation characterized by congestion (edema) and       erythema was found in the duodenal bulb. Impression:               - Small hiatal hernia.                           - Gastritis. Biopsied.                           - Duodenitis. Moderate Sedation:      Per Anesthesia Care Recommendation:           - Patient has a contact number available for                            emergencies. The signs and symptoms of potential                            delayed complications were discussed with the                            patient. Return to normal activities tomorrow.                            Written discharge instructions were provided to the                            patient.                           - Resume previous diet.                           - Continue present medications.                           - Await pathology results.                           - Return to GI clinic in 3 months.                           - Use a proton pump inhibitor PO BID. Procedure Code(s):        --- Professional ---                           [phone], Esophagogastroduodenoscopy, flexible,                            transoral; with biopsy, single or multiple Diagnosis  Code(s):        --- Professional ---                           K44.9, Diaphragmatic hernia without obstruction or                            gangrene                           K29.70, Gastritis, unspecified, without bleeding                           K29.80, Duodenitis without bleeding                           R10.84, Generalized abdominal pain CPT copyright 2022 American Medical Association. All rights reserved. The codes documented in this report are preliminary and upon coder review may  be revised to meet current compliance requirements. Hennie Duos. Marletta Lor, DO Hennie Duos. [name], DO 11/30/2023 10:33:06 AM This report has been signed electronically. Number of Addenda: 0

## 2023-12-01 ENCOUNTER — Telehealth: Payer: Self-pay

## 2023-12-01 ENCOUNTER — Telehealth: Payer: Self-pay | Admitting: Family Medicine

## 2023-12-01 LAB — SURGICAL PATHOLOGY

## 2023-12-01 NOTE — Telephone Encounter (Signed)
Pt phoned stating that she was advised she was needing her Rx for Prednisone and it wasn't sent to her pharmacy. Please advise

## 2023-12-01 NOTE — Telephone Encounter (Signed)
I am not sure who this recommendation came from. I do not see that our office (myself or Dr. Marletta Lor) have recommended prednisone.

## 2023-12-01 NOTE — Telephone Encounter (Signed)
PCS Forms Noted Copied Scanned Original in provider box Copy at front desk

## 2023-12-05 NOTE — Anesthesia Postprocedure Evaluation (Signed)
Anesthesia Post Note  Patient: Heidi [name]  Procedure(s) Performed: COLONOSCOPY WITH PROPOFOL ESOPHAGOGASTRODUODENOSCOPY (EGD) WITH PROPOFOL BIOPSY POLYPECTOMY  Patient location during evaluation: Phase II Anesthesia Type: General Level of consciousness: awake Pain management: pain level controlled Vital Signs Assessment: post-procedure vital signs reviewed and stable Respiratory status: spontaneous breathing and respiratory function stable Cardiovascular status: blood pressure returned to baseline and stable Postop Assessment: no headache and no apparent nausea or vomiting Anesthetic complications: no Comments: Late entry   No notable events documented.   Last Vitals:  Vitals:   11/30/23 0947 11/30/23 1100  BP: (!) 127/94 (!) 127/92  Pulse: 82 96  Resp: 19 19  Temp: 37.1 C 36.6 C  SpO2: 99% 99%    Last Pain:  Vitals:   12/01/23 1506  TempSrc:   PainSc: 0-No pain                 Windell Norfolk

## 2023-12-06 NOTE — Telephone Encounter (Signed)
Spoke with pt and she got the name mixed up, she was talking about her pantoprazole being increased. Advised pt that the Rx was sent to [facility]. Pt verbalized understanding.

## 2023-12-06 NOTE — Telephone Encounter (Signed)
Noted  

## 2023-12-07 ENCOUNTER — Other Ambulatory Visit: Payer: Self-pay

## 2023-12-07 DIAGNOSIS — R6881 Early satiety: Secondary | ICD-10-CM

## 2023-12-07 DIAGNOSIS — D649 Anemia, unspecified: Secondary | ICD-10-CM

## 2023-12-07 MED ORDER — PANTOPRAZOLE SODIUM 40 MG PO TBEC: 40.0000 mg | DELAYED_RELEASE_TABLET | Freq: Two times a day (BID) | ORAL | 11 refills | Status: DC

## 2023-12-08 ENCOUNTER — Encounter (HOSPITAL_COMMUNITY): Payer: Self-pay | Admitting: Internal Medicine

## 2023-12-08 ENCOUNTER — Other Ambulatory Visit: Payer: Self-pay | Admitting: Family Medicine

## 2023-12-18 ENCOUNTER — Emergency Department (HOSPITAL_COMMUNITY)
Admission: EM | Admit: 2023-12-18 | Discharge: 2023-12-18 | Disposition: A | Discharge status: Home / Self Care | Payer: MEDICAID | Attending: Emergency Medicine | Admitting: Emergency Medicine

## 2023-12-18 ENCOUNTER — Other Ambulatory Visit: Payer: Self-pay

## 2023-12-18 ENCOUNTER — Encounter (HOSPITAL_COMMUNITY): Payer: Self-pay

## 2023-12-18 DIAGNOSIS — M549 Dorsalgia, unspecified: Secondary | ICD-10-CM | POA: Diagnosis present

## 2023-12-18 DIAGNOSIS — G8929 Other chronic pain: Secondary | ICD-10-CM

## 2023-12-18 DIAGNOSIS — M5431 Sciatica, right side: Secondary | ICD-10-CM

## 2023-12-18 MED ORDER — CELECOXIB 200 MG PO CAPS
200.0000 mg | ORAL_CAPSULE | Freq: Two times a day (BID) | ORAL | 0 refills | Status: DC
Start: 2023-12-18 — End: 2024-02-14

## 2023-12-18 MED ORDER — METHYLPREDNISOLONE 4 MG PO TBPK: ORAL_TABLET | ORAL | 0 refills | Status: DC

## 2023-12-18 MED ORDER — KETOROLAC TROMETHAMINE 30 MG/ML IJ SOLN
30.0000 mg | Freq: Once | INTRAMUSCULAR | Status: AC
  Administered 2023-12-18: 30 mg via INTRAMUSCULAR
  Filled 2023-12-18: qty 1

## 2023-12-18 MED ORDER — KETOROLAC TROMETHAMINE 30 MG/ML IJ SOLN: 30.0000 mg | Freq: Once | INTRAMUSCULAR | Status: DC

## 2023-12-18 MED ORDER — DEXAMETHASONE SODIUM PHOSPHATE 10 MG/ML IJ SOLN
10.0000 mg | Freq: Once | INTRAMUSCULAR | Status: AC
  Administered 2023-12-18: 10 mg via INTRAMUSCULAR
  Filled 2023-12-18: qty 1

## 2023-12-18 NOTE — ED Triage Notes (Signed)
Pt arrives with c/o back pain and generalized body aches reports history of sciatic nerve pain. Pain X2 days. Pt has taken ibuprofen and tylenol with no relief.

## 2023-12-18 NOTE — Discharge Instructions (Signed)
You may take Tylenol with the medications I prescribed but do not take ibuprofen, aspirin, Aleve . SEEK IMMEDIATE MEDICAL ATTENTION IF: New numbness, tingling, weakness, or problem with the use of your arms or legs.  Severe back pain not relieved with medications.  Change in bowel or bladder control.  Increasing pain in any areas of the body (such as chest or abdominal pain).  Shortness of breath, dizziness or fainting.  Nausea (feeling sick to your stomach), vomiting, fever, or sweats.

## 2023-12-18 NOTE — ED Provider Notes (Cosign Needed)
[place] EMERGENCY DEPARTMENT AT [facility] Provider Note   CSN: [id number] Arrival date & time: 12/18/23  1357     History  Chief Complaint  Patient presents with   Back Pain    Heidi [name] is a 52 y.o. female with a past medical history of chronic right sided sciatica, degenerative disc disease, chronic joint pain.  Patient states that her pain is a lot worse right now.  She has been taking ibuprofen and tizanidine without relief of her symptoms.  Skin complains of pain radiating down the back of her right leg.  She denies weakness, saddle anesthesia, loss of bowel or bladder control.  She has had an MRI recently   Back Pain      Home Medications Prior to Admission medications   Medication Sig Start Date End Date Taking? Authorizing Provider  albuterol (VENTOLIN HFA) 108 (90 Base) MCG/ACT inhaler Inhale 2 puffs into the lungs every 6 (six) hours as needed for wheezing or shortness of breath. 10/26/23   Del Nigel Berthold, FNP  ALPRAZolam (XANAX) 0.25 MG tablet Take 1 tablet (0.25 mg total) by mouth at bedtime as needed for anxiety. 10/13/23   Vickki Hearing, MD  citalopram (CELEXA) 20 MG tablet Take 20 mg by mouth daily. 08/04/23   [provider]  cloNIDine (CATAPRES) 0.1 MG tablet TAKE ONE TABLET BY MOUTH EVERY DAY 11/09/23   Del Newman Nip, Tenna Child, FNP  cyclobenzaprine (FLEXERIL) 5 MG tablet TAKE 1 TABLET BY MOUTH (3) TIMES A DAY AS NEEDED FOR MUSCLE SPASMS. 11/05/23   Del Nigel Berthold, FNP  diazepam (VALIUM) 5 MG tablet Take 5 mg by mouth as needed. 09/09/23   [provider]  diltiazem (CARDIZEM CD) 120 MG 24 hr capsule Take 1 capsule (120 mg total) by mouth daily. 04/16/23   Vassie Loll, MD  ferrous sulfate 325 (65 FE) MG tablet Take 325 mg by mouth daily with breakfast.    [provider]  folic acid (FOLVITE) 1 MG tablet Take 1 tablet (1 mg total) by mouth daily. 07/25/23   Vassie Loll, MD  gabapentin  (NEURONTIN) 400 MG capsule Take 1 capsule (400 mg total) by mouth 3 (three) times daily. 10/11/23   Vickki Hearing, MD  ibuprofen (ADVIL) 800 MG tablet Take 1 tablet (800 mg total) by mouth every 8 (eight) hours as needed. 11/17/23   Vickki Hearing, MD  indomethacin (INDOCIN) 25 MG capsule Take 1 capsule (25 mg total) by mouth 2 (two) times daily with a meal. 09/03/23   Vickki Hearing, MD  lidocaine (LIDODERM) 5 % Place 1 patch onto the skin daily. Remove & Discard patch within 12 hours or as directed by MD 08/11/23   Del Nigel Berthold, FNP  metoprolol tartrate (LOPRESSOR) 25 MG tablet Take 0.5 tablets (12.5 mg total) by mouth 2 (two) times daily. 07/24/23   Vassie Loll, MD  naltrexone (DEPADE) 50 MG tablet Take 1 tablet (50 mg total) by mouth daily. 09/23/23   Del Newman Nip, Tenna Child, FNP  nicotine (NICODERM CQ - DOSED IN MG/24 HOURS) 14 mg/24hr patch 14 mg daily. 08/09/23   [provider]  Omega-3 Fatty Acids (FISH OIL) 1000 MG CAPS Take 1 capsule (1,000 mg total) by mouth 2 (two) times daily. 09/27/23   Del Nigel Berthold, FNP  pantoprazole (PROTONIX) 40 MG tablet Take 1 tablet (40 mg total) by mouth 2 (two) times daily. 12/07/23 12/06/24  Lanelle Bal, DO  predniSONE (DELTASONE) 10 MG tablet Take 1 tablet (10 mg total) by mouth 3 (three) times daily. 10/01/23   Vickki Hearing, MD  rosuvastatin (CRESTOR) 40 MG tablet Take 1 tablet (40 mg total) by mouth daily. 09/27/23   Del Nigel Berthold, FNP  tiZANidine (ZANAFLEX) 4 MG capsule Take 1 capsule (4 mg total) by mouth 2 (two) times daily as needed for muscle spasms. 10/01/23   Vickki Hearing, MD  traZODone (DESYREL) 150 MG tablet Take 150 mg by mouth at bedtime. 08/04/23   [provider]      Allergies    Patient has no known allergies.    Review of Systems   Review of Systems  Musculoskeletal:  Positive for back pain.    Physical Exam Updated Vital Signs BP (!) 130/90 (BP  Location: Right Arm)   Pulse 100   Temp 98.7 F (37.1 C) (Oral)   Resp 18   Ht 5\' 3"  (1.6 m)   Wt 80.7 kg   SpO2 96%   BMI 31.53 kg/m  Physical Exam Vitals and nursing note reviewed.  Constitutional:      General: She is not in acute distress.    Appearance: She is well-developed. She is not diaphoretic.  HENT:     Head: Normocephalic and atraumatic.     Right Ear: External ear normal.     Left Ear: External ear normal.     Nose: Nose normal.     Mouth/Throat:     Mouth: Mucous membranes are moist.  Eyes:     General: No scleral icterus.    Conjunctiva/sclera: Conjunctivae normal.  Cardiovascular:     Rate and Rhythm: Normal rate and regular rhythm.     Heart sounds: Normal heart sounds. No murmur heard.    No friction rub. No gallop.  Pulmonary:     Effort: Pulmonary effort is normal. No respiratory distress.     Breath sounds: Normal breath sounds.  Abdominal:     General: Bowel sounds are normal. There is no distension.     Palpations: Abdomen is soft. There is no mass.     Tenderness: There is no abdominal tenderness. There is no guarding.  Musculoskeletal:     Cervical back: Normal range of motion.     Comments: Patient appears to be in mild to moderate pain, antalgic gait noted. Lumbosacral spine area reveals no local tenderness or mass. Painful and reduced LS ROM noted. Straight leg raise is positive on right. DTR's, motor strength and sensation normal, including heel and toe gait.  Peripheral pulses are palpable.   Skin:    General: Skin is warm and dry.  Neurological:     Mental Status: She is alert and oriented to person, place, and time.  Psychiatric:        Behavior: Behavior normal.     ED Results / Procedures / Treatments   Labs (all labs ordered are listed, but only abnormal results are displayed) Labs Reviewed - No data to display  EKG None  Radiology No results found.  Procedures Procedures    Medications Ordered in ED Medications - No  data to display  ED Course/ Medical Decision Making/ A&P                                 Medical Decision Making Patient with back pain.  No neurological deficits and normal neuro exam.  Patient can walk  but states is painful.  No loss of bowel or bladder control.  No concern for cauda equina.  No fever, night sweats, weight loss, h/o cancer, IVDU.  RICE protocol and pain medicine indicated and discussed with patient.    Risk Prescription drug management.           Final Clinical Impression(s) / ED Diagnoses Final diagnoses:  None    Rx / DC Orders ED Discharge Orders     None         Arthor Captain, PA-C 12/18/23 1907

## 2023-12-18 NOTE — ED Provider Triage Note (Cosign Needed)
Emergency Medicine Provider Triage Evaluation Note  Heidi [name] , a 52 y.o. female  was evaluated in triage.  Pt complains of left-sided sciatica acting up, also having gnawing pain in her right shoulder and pain in bilateral hands.  Also having neck pain, no chest pain or shortness of breath.  Symptoms ongoing for 2 days.  Review of Systems  Positive: Joint pain, sciatica pain Negative: Abdominal pain, fevers, chest pain, shortness of breath  Physical Exam  BP (!) 130/90 (BP Location: Right Arm)   Pulse 100   Temp 98.7 F (37.1 C) (Oral)   Resp 18   Ht 5\' 3"  (1.6 m)   Wt 80.7 kg   SpO2 96%   BMI 31.53 kg/m  Gen:   Awake, no distress   Resp:  Normal effort  MSK:   Moves extremities without difficulty  Other:    Medical Decision Making  Medically screening exam initiated at 4:29 PM.  Appropriate orders placed.  Heidi [name] was informed that the remainder of the evaluation will be completed by another provider, this initial triage assessment does not replace that evaluation, and the importance of remaining in the ED until their evaluation is complete.     Heidi [name], [place] 12/18/23 [phone]

## 2023-12-28 ENCOUNTER — Encounter: Payer: Self-pay | Admitting: Student in an Organized Health Care Education/Training Program

## 2023-12-28 ENCOUNTER — Ambulatory Visit
Payer: MEDICAID | Attending: Student in an Organized Health Care Education/Training Program | Admitting: Student in an Organized Health Care Education/Training Program

## 2023-12-28 VITALS — BP 141/74 | HR 75 | Temp 97.1°F | Resp 16 | Ht 63.0 in | Wt 184.0 lb

## 2023-12-28 DIAGNOSIS — F339 Major depressive disorder, recurrent, unspecified: Secondary | ICD-10-CM

## 2023-12-28 DIAGNOSIS — G5603 Carpal tunnel syndrome, bilateral upper limbs: Secondary | ICD-10-CM | POA: Insufficient documentation

## 2023-12-28 DIAGNOSIS — M5412 Radiculopathy, cervical region: Secondary | ICD-10-CM

## 2023-12-28 DIAGNOSIS — M47816 Spondylosis without myelopathy or radiculopathy, lumbar region: Secondary | ICD-10-CM | POA: Diagnosis not present

## 2023-12-28 DIAGNOSIS — G894 Chronic pain syndrome: Secondary | ICD-10-CM

## 2023-12-28 DIAGNOSIS — F1090 Alcohol use, unspecified, uncomplicated: Secondary | ICD-10-CM | POA: Diagnosis not present

## 2023-12-28 NOTE — Patient Instructions (Signed)
 Moderate Conscious Sedation, Adult, Care After After the procedure, it is common to have: Sleepiness for a few hours. Impaired judgment for a few hours. Trouble with balance. Nausea or vomiting if you eat too soon. Follow these instructions at home: For the time period you were told by your health care provider:  Rest. Do not participate in activities where you could fall or become injured. Do not drive or use machinery. Do not drink alcohol. Do not take sleeping pills or medicines that cause drowsiness. Do not make important decisions or sign legal documents. Do not take care of children on your own. Eating and drinking Follow instructions from your health care provider about what you may eat and drink. Drink enough fluid to keep your urine pale yellow. If you vomit: Drink clear fluids slowly and in small amounts as you are able. Clear fluids include water , ice chips, low-calorie sports drinks, and fruit juice that has water  added to it (diluted fruit juice). Eat light and bland foods in small amounts as you are able. These foods include bananas, applesauce, rice, lean meats, toast, and crackers. General instructions Take over-the-counter and prescription medicines only as told by your health care provider. Have a responsible adult stay with you for the time you are told. Do not use any products that contain nicotine  or tobacco. These products include cigarettes, chewing tobacco, and vaping devices, such as e-cigarettes. If you need help quitting, ask your health care provider. Return to your normal activities as told by your health care provider. Ask your health care provider what activities are safe for you. Your health care provider may give you more instructions. Make sure you know what you can and cannot do. Contact a health care provider if: You are still sleepy or having trouble with balance after 24 hours. You feel light-headed. You vomit every time you eat or drink. You get  a rash. You have a fever. You have redness or swelling around the IV site. Get help right away if: You have trouble breathing. You start to feel confused at home. These symptoms may be an emergency. Get help right away. Call 911. Do not wait to see if the symptoms will go away. Do not drive yourself to the hospital. This information is not intended to replace advice given to you by your health care provider. Make sure you discuss any questions you have with your health care provider. Document Revised: 06/22/2022 Document Reviewed: 06/22/2022 Elsevier Patient Education  2024 [employer] GENERAL RISKS AND COMPLICATIONS  What are the risk, side effects and possible complications? Generally speaking, most procedures are safe.  However, with any procedure there are risks, side effects, and the possibility of complications.  The risks and complications are dependent upon the sites that are lesioned, or the type of nerve block to be performed.  The closer the procedure is to the spine, the more serious the risks are.  Great care is taken when placing the radio frequency needles, block needles or lesioning probes, but sometimes complications can occur. Infection: Any time there is an injection through the skin, there is a risk of infection.  This is why sterile conditions are used for these blocks.  There are four possible types of infection. Localized skin infection. Central Nervous System Infection-This can be in the form of Meningitis, which can be deadly. Epidural Infections-This can be in the form of an epidural abscess, which can cause pressure inside of the spine, causing compression of the spinal cord with subsequent  paralysis. This would require an emergency surgery to decompress, and there are no guarantees that the patient would recover from the paralysis. Discitis-This is an infection of the intervertebral discs.  It occurs in about 1% of discography procedures.  It is difficult to treat  and it may lead to surgery.        2. Pain: the needles have to go through skin and soft tissues, will cause soreness.       3. Damage to internal structures:  The nerves to be lesioned may be near blood vessels or    other nerves which can be potentially damaged.       4. Bleeding: Bleeding is more common if the patient is taking blood thinners such as  aspirin , Coumadin, Ticiid, Plavix, etc., or if he/she have some genetic predisposition  such as hemophilia. Bleeding into the spinal canal can cause compression of the spinal  cord with subsequent paralysis.  This would require an emergency surgery to  decompress and there are no guarantees that the patient would recover from the  paralysis.       5. Pneumothorax:  Puncturing of a lung is a possibility, every time a needle is introduced in  the area of the chest or upper back.  Pneumothorax refers to free air around the  collapsed lung(s), inside of the thoracic cavity (chest cavity).  Another two possible  complications related to a similar event would include: Hemothorax and Chylothorax.   These are variations of the Pneumothorax, where instead of air around the collapsed  lung(s), you may have blood or chyle, respectively.       6. Spinal headaches: They may occur with any procedures in the area of the spine.       7. Persistent CSF (Cerebro-Spinal Fluid) leakage: This is a rare problem, but may occur  with prolonged intrathecal or epidural catheters either due to the formation of a fistulous  track or a dural tear.       8. Nerve damage: By working so close to the spinal cord, there is always a possibility of  nerve damage, which could be as serious as a permanent spinal cord injury with  paralysis.       9. Death:  Although rare, severe deadly allergic reactions known as Anaphylactic  reaction can occur to any of the medications used.      10. Worsening of the symptoms:  We can always make thing worse.  What are the chances of something like this  happening? Chances of any of this occuring are extremely low.  By statistics, you have more of a chance of getting killed in a motor vehicle accident: while driving to the hospital than any of the above occurring .  Nevertheless, you should be aware that they are possibilities.  In general, it is similar to taking a shower.  Everybody knows that you can slip, hit your head and get killed.  Does that mean that you should not shower again?  Nevertheless always keep in mind that statistics do not mean anything if you happen to be on the wrong side of them.  Even if a procedure has a 1 (one) in a 1,000,000 (million) chance of going wrong, it you happen to be that one..Also, keep in mind that by statistics, you have more of a chance of having something go wrong when taking medications.  Who should not have this procedure? If you are on a blood thinning medication (e.g. Coumadin, Plavix, see  list of Blood Thinners), or if you have an active infection going on, you should not have the procedure.  If you are taking any blood thinners, please inform your physician.  How should I prepare for this procedure? Do not eat or drink anything at least six hours prior to the procedure. Bring a driver with you .  It cannot be a taxi. Come accompanied by an adult that can drive you back, and that is strong enough to help you if your legs get weak or numb from the local anesthetic. Take all of your medicines the morning of the procedure with just enough water  to swallow them. If you have diabetes, make sure that you are scheduled to have your procedure done first thing in the morning, whenever possible. If you have diabetes, take only half of your insulin  dose and notify our nurse that you have done so as soon as you arrive at the clinic. If you are diabetic, but only take blood sugar pills (oral hypoglycemic), then do not take them on the morning of your procedure.  You may take them after you have had the procedure. Do  not take aspirin  or any aspirin -containing medications, at least eleven (11) days prior to the procedure.  They may prolong bleeding. Wear loose fitting clothing that may be easy to take off and that you would not mind if it got stained with Betadine or blood. Do not wear any jewelry or perfume Remove any nail coloring.  It will interfere with some of our monitoring equipment.  NOTE: Remember that this is not meant to be interpreted as a complete list of all possible complications.  Unforeseen problems may occur.  BLOOD THINNERS The following drugs contain aspirin  or other products, which can cause increased bleeding during surgery and should not be taken for 2 weeks prior to and 1 week after surgery.  If you should need take something for relief of minor pain, you may take acetaminophen  which is found in Tylenol ,m Datril, Anacin-3 and Panadol. It is not blood thinner. The products listed below are.  Do not take any of the products listed below in addition to any listed on your instruction sheet.  A.P.C or A.P.C with Codeine Codeine Phosphate Capsules #3 Ibuprofen  Ridaura  ABC compound Congesprin Imuran rimadil  Advil  Cope Indocin  Robaxisal  Alka-Seltzer Effervescent Pain Reliever and Antacid Coricidin or Coricidin-D  Indomethacin  Rufen   Alka-Seltzer plus Cold Medicine Cosprin Ketoprofen S-A-C Tablets  Anacin Analgesic Tablets or Capsules Coumadin Korlgesic Salflex  Anacin Extra Strength Analgesic tablets or capsules CP-2 Tablets Lanoril Salicylate  Anaprox  Cuprimine Capsules Levenox Salocol  Anexsia-D Dalteparin Magan Salsalate  Anodynos Darvon compound Magnesium  Salicylate Sine-off  Ansaid Dasin Capsules Magsal Sodium Salicylate  Anturane Depen Capsules Marnal Soma  APF Arthritis pain formula Dewitt's Pills Measurin  Stanback  Argesic Dia-Gesic Meclofenamic Sulfinpyrazone  Arthritis Bayer Timed Release Aspirin  Diclofenac Meclomen Sulindac  Arthritis pain formula Anacin Dicumarol Medipren   Supac  Analgesic (Safety coated) Arthralgen Diffunasal Mefanamic Suprofen  Arthritis Strength Bufferin Dihydrocodeine Mepro Compound Suprol  Arthropan liquid Dopirydamole Methcarbomol with Aspirin  Synalgos  ASA tablets/Enseals Disalcid Micrainin Tagament  Ascriptin Doan's Midol  Talwin  Ascriptin A/D Dolene Mobidin Tanderil  Ascriptin Extra Strength Dolobid Moblgesic Ticlid  Ascriptin with Codeine Doloprin or Doloprin with Codeine Momentum Tolectin  Asperbuf Duoprin Mono-gesic Trendar  Aspergum Duradyne Motrin  or Motrin  IB Triminicin  Aspirin  plain, buffered or enteric coated Durasal Myochrisine Trigesic  Aspirin  Suppositories Easprin Nalfon Trillsate  Aspirin  with Codeine Ecotrin Regular or Extra  Strength Naprosyn  Uracel  Atromid-S Efficin Naproxen  Ursinus  Auranofin Capsules Elmiron Neocylate Vanquish  Axotal Emagrin Norgesic Verin  Azathioprine Empirin or Empirin with Codeine Normiflo Vitamin E  Azolid Emprazil Nuprin  Voltaren  Bayer Aspirin  plain, buffered or children's or timed BC Tablets or powders Encaprin Orgaran Warfarin Sodium  Buff-a-Comp Enoxaparin  Orudis Zorpin  Buff-a-Comp with Codeine Equegesic Os-Cal-Gesic   Buffaprin Excedrin plain, buffered or Extra Strength Oxalid   Bufferin Arthritis Strength Feldene Oxphenbutazone   Bufferin plain or Extra Strength Feldene Capsules Oxycodone  with Aspirin    Bufferin with Codeine Fenoprofen Fenoprofen Pabalate or Pabalate-SF   Buffets II Flogesic Panagesic   Buffinol plain or Extra Strength Florinal or Florinal with Codeine Panwarfarin   Buf-Tabs Flurbiprofen Penicillamine   Butalbital Compound Four-way cold tablets Penicillin   Butazolidin Fragmin Pepto-Bismol   Carbenicillin Geminisyn Percodan   Carna Arthritis Reliever Geopen Persantine   Carprofen Gold's salt Persistin   Chloramphenicol Goody's Phenylbutazone   Chloromycetin Haltrain Piroxlcam   Clmetidine heparin  Plaquenil   Cllnoril Hyco-pap Ponstel   Clofibrate Hydroxy  chloroquine Propoxyphen         Before stopping any of these medications, be sure to consult the physician who ordered them.  Some, such as Coumadin (Warfarin) are ordered to prevent or treat serious conditions such as deep thrombosis, pumonary embolisms, and other heart problems.  The amount of time that you may need off of the medication may also vary with the medication and the reason for which you were taking it.  If you are taking any of these medications, please make sure you notify your pain physician before you undergo any procedures.         Facet Blocks Patient Information  Description: The facets are joints in the spine between the vertebrae.  Like any joints in the body, facets can become irritated and painful.  Arthritis can also effect the facets.  By injecting steroids and local anesthetic in and around these joints, we can temporarily block the nerve supply to them.  Steroids act directly on irritated nerves and tissues to reduce selling and inflammation which often leads to decreased pain.  Facet blocks may be done anywhere along the spine from the neck to the low back depending upon the location of your pain.   After numbing the skin with local anesthetic (like Novocaine), a small needle is passed onto the facet joints under x-ray guidance.  You may experience a sensation of pressure while this is being done.  The entire block usually lasts about 15-25 minutes.   Conditions which may be treated by facet blocks:  Low back/buttock pain Neck/shoulder pain Certain types of headaches  Preparation for the injection:  Do not eat any solid food or dairy products within 8 hours of your appointment. You may drink clear liquid up to 3 hours before appointment.  Clear liquids include water , black coffee, juice or soda.  No milk or cream please. You may take your regular medication, including pain medications, with a sip of water  before your appointment.  Diabetics should hold  regular insulin  (if taken separately) and take 1/2 normal NPH dose the morning of the procedure.  Carry some sugar containing items with you to your appointment. A driver must accompany you and be prepared to drive you home after your procedure. Bring all your current medications with you. An IV may be inserted and sedation may be given at the discretion of the physician. A blood pressure cuff, EKG and other monitors will often be  applied during the procedure.  Some patients may need to have extra oxygen administered for a short period. You will be asked to provide medical information, including your allergies and medications, prior to the procedure.  We must know immediately if you are taking blood thinners (like Coumadin/Warfarin) or if you are allergic to IV iodine contrast (dye).  We must know if you could possible be pregnant.  Possible side-effects:  Bleeding from needle site Infection (rare, may require surgery) Nerve injury (rare) Numbness & tingling (temporary) Difficulty urinating (rare, temporary) Spinal headache (a headache worse with upright posture) Light-headedness (temporary) Pain at injection site (serveral days) Decreased blood pressure (rare, temporary) Weakness in arm/leg (temporary) Pressure sensation in back/neck (temporary)   Call if you experience:  Fever/chills associated with headache or increased back/neck pain Headache worsened by an upright position New onset, weakness or numbness of an extremity below the injection site Hives or difficulty breathing (go to the emergency room) Inflammation or drainage at the injection site(s) Severe back/neck pain greater than usual New symptoms which are concerning to you  Please note:  Although the local anesthetic injected can often make your back or neck feel good for several hours after the injection, the pain will likely return. It takes 3-7 days for steroids to work.  You may not notice any pain relief for at  least one week.  If effective, we will often do a series of 2-3 injections spaced 3-6 weeks apart to maximally decrease your pain.  After the initial series, you may be a candidate for a more permanent nerve block of the facets.  If you have any questions, please call #[phone] [facility] Clinic[facility] Patient Information  Description: The epidural space surrounds the nerves as they exit the spinal cord.  In some patients, the nerves can be compressed and inflamed by a bulging disc or a tight spinal canal (spinal stenosis).  By injecting steroids into the epidural space, we can bring irritated nerves into direct contact with a potentially helpful medication.  These steroids act directly on the irritated nerves and can reduce swelling and inflammation which often leads to decreased pain.  Epidural steroids may be injected anywhere along the spine and from the neck to the low back depending upon the location of your pain.   After numbing the skin with local anesthetic (like Novocaine), a small needle is passed into the epidural space slowly.  You may experience a sensation of pressure while this is being done.  The entire block usually last less than 10 minutes.  Conditions which may be treated by epidural steroids:  Low back and leg pain Neck and arm pain Spinal stenosis Post-laminectomy syndrome Herpes zoster (shingles) pain Pain from compression fractures  Preparation for the injection:  Do not eat any solid food or dairy products within 8 hours of your appointment.  You may drink clear liquids up to 3 hours before appointment.  Clear liquids include water , black coffee, juice or soda.  No milk or cream please. You may take your regular medication, including pain medications, with a sip of water  before your appointment  Diabetics should hold regular insulin  (if taken separately) and take 1/2 normal NPH dos the morning of the procedure.   Carry some sugar containing items with you to your appointment. A driver must accompany you and be prepared to drive you home after your procedure.  Bring all your current medications with your. An IV may be inserted and  sedation may be given at the discretion of the physician.   A blood pressure cuff, EKG and other monitors will often be applied during the procedure.  Some patients may need to have extra oxygen administered for a short period. You will be asked to provide medical information, including your allergies, prior to the procedure.  We must know immediately if you are taking blood thinners (like Coumadin/Warfarin)  Or if you are allergic to IV iodine contrast (dye). We must know if you could possible be pregnant.  Possible side-effects: Bleeding from needle site Infection (rare, may require surgery) Nerve injury (rare) Numbness & tingling (temporary) Difficulty urinating (rare, temporary) Spinal headache ( a headache worse with upright posture) Light -headedness (temporary) Pain at injection site (several days) Decreased blood pressure (temporary) Weakness in arm/leg (temporary) Pressure sensation in back/neck (temporary)  Call if you experience: Fever/chills associated with headache or increased back/neck pain. Headache worsened by an upright position. New onset weakness or numbness of an extremity below the injection site Hives or difficulty breathing (go to the emergency room) Inflammation or drainage at the infection site Severe back/neck pain Any new symptoms which are concerning to you  Please note:  Although the local anesthetic injected can often make your back or neck feel good for several hours after the injection, the pain will likely return.  It takes 3-7 days for steroids to work in the epidural space.  You may not notice any pain relief for at least that one week.  If effective, we will often do a series of three injections spaced 3-6 weeks apart to maximally  decrease your pain.  After the initial series, we generally will wait several months before considering a repeat injection of the same type.  If you have any questions, please call [phone] [facility]

## 2023-12-28 NOTE — Progress Notes (Signed)
 Safety precautions to be maintained throughout the outpatient stay will include: orient to surroundings, keep bed in low position, maintain call bell within reach at all times, provide assistance with transfer out of bed and ambulation.

## 2023-12-28 NOTE — Progress Notes (Signed)
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 10/26/2023   Lumbar spondylosis 12/28/2023   Chronic pain syndrome 12/28/2023   Resolved Ambulatory Problems    Diagnosis Date Noted   SHOULDER PAIN 10/29/2010   Cervicalgia 01/28/2011   Past Medical History:  Diagnosis Date   Anxiety    Cervical radiculopathy    Depression    Hypertension    Constitutional Exam  General appearance: Well nourished, well developed, and well hydrated. In no apparent acute distress Vitals:   12/28/23 0947  BP: (!) 141/74  Pulse: 75  Resp: 16  Temp: (!) 97.1 F (36.2 C)  SpO2: 95%  Weight: 184 lb (83.5 kg)  Height: 5' 3 (1.6 m)   BMI Assessment: Estimated body mass index is 32.59 kg/m as calculated from the following:   Height as  of this  encounter: 5' 3 (1.6 m).   Weight as of this encounter: 184 lb (83.5 kg).  BMI interpretation table: BMI level Category Range association with higher incidence of chronic pain  <18 kg/m2 Underweight   18.5-24.9 kg/m2 Ideal body weight   25-29.9 kg/m2 Overweight Increased incidence by 20%  30-34.9 kg/m2 Obese (Class I) Increased incidence by 68%  35-39.9 kg/m2 Severe obesity (Class II) Increased incidence by 136%  >40 kg/m2 Extreme obesity (Class III) Increased incidence by 254%   Patient's current BMI Ideal Body weight  Body mass index is 32.59 kg/m. Ideal body weight: 52.4 kg (115 lb 8.3 oz) Adjusted ideal body weight: 64.8 kg (142 lb 14.6 oz)   BMI Readings from Last 4 Encounters:  12/28/23 32.59 kg/m  12/18/23 31.53 kg/m  11/30/23 31.55 kg/m  11/25/23 31.55 kg/m   Wt Readings from Last 4 Encounters:  12/28/23 184 lb (83.5 kg)  12/18/23 178 lb (80.7 kg)  11/30/23 178 lb 2.1 oz (80.8 kg)  11/25/23 178 lb 2.1 oz (80.8 kg)    Psych/Mental status: Alert, oriented x 3 (person, place, & time)       Eyes: PERLA Respiratory: No evidence of acute respiratory distress  Cervical Spine Area Exam  Skin & Axial Inspection: No masses, redness, edema, swelling, or associated skin lesions Alignment: Symmetrical Functional ROM: Pain restricted ROM, bilaterally Stability: No instability detected Muscle Tone/Strength: Functionally intact. No obvious neuro-muscular anomalies detected. Sensory (Neurological): Neurogenic pain pattern Palpation: No palpable anomalies             Upper Extremity (UE) Exam    Side: Right upper extremity  Side: Left upper extremity  Skin & Extremity Inspection: Skin color, temperature, and hair growth are WNL. No peripheral edema or cyanosis. No masses, redness, swelling, asymmetry, or associated skin lesions. No contractures.  Skin & Extremity Inspection: Skin color, temperature, and hair growth are WNL. No peripheral edema or cyanosis. No masses, redness,  swelling, asymmetry, or associated skin lesions. No contractures.  Functional ROM: Pain restricted ROM for shoulder  Functional ROM: Pain restricted ROM for shoulder  Muscle Tone/Strength: Functionally intact. No obvious neuro-muscular anomalies detected.  Muscle Tone/Strength: Functionally intact. No obvious neuro-muscular anomalies detected.  Sensory (Neurological): Neurogenic pain pattern          Sensory (Neurological): Neurogenic pain pattern          Palpation: No palpable anomalies              Palpation: No palpable anomalies              Provocative Test(s):  Phalen's test: deferred Tinel's test: deferred Apley's scratch test (touch opposite shoulder):  Action 1 (Across chest): deferred Action 2 (Overhead): deferred Action 3 (LB reach): deferred   Provocative Test(s):  Phalen's test: deferred Tinel's test: deferred Apley's scratch test (touch opposite shoulder):  Action 1 (Across chest): deferred Action 2 (Overhead): deferred Action 3 (LB reach): deferred    Thoracic Spine Area Exam  Skin & Axial Inspection: No masses, redness, or swelling Alignment: Symmetrical Functional ROM: Unrestricted ROM Stability: No instability detected Muscle Tone/Strength: Functionally intact. No obvious neuro-muscular anomalies detected. Sensory (Neurological): Unimpaired Muscle strength & Tone: No palpable anomalies Lumbar Spine Area Exam  Skin & Axial Inspection: No masses, redness, or swelling Alignment: Symmetrical Functional ROM: Pain restricted ROM affecting both sides Stability: No instability detected Muscle Tone/Strength: Functionally intact. No obvious neuro-muscular anomalies detected. Sensory (Neurological): Dermatomal pain pattern +SLR Gait & Posture Assessment  Ambulation:  Patient ambulates using a walker Gait: Significantly limited. Dependent on assistive device to ambulate Posture: Difficulty standing up straight, due to pain  Lower Extremity Exam    Side: Right lower  extremity  Side: Left lower extremity  Stability: No instability observed          Stability: No instability observed          Skin & Extremity Inspection: Skin color, temperature, and hair growth are WNL. No peripheral edema or cyanosis. No masses, redness, swelling, asymmetry, or associated skin lesions. No contractures.  Skin & Extremity Inspection: Skin color, temperature, and hair growth are WNL. No peripheral edema or cyanosis. No masses, redness, swelling, asymmetry, or associated skin lesions. No contractures.  Functional ROM: Unrestricted ROM                  Functional ROM: Unrestricted ROM                  Muscle Tone/Strength: Functionally intact. No obvious neuro-muscular anomalies detected.  Muscle Tone/Strength: Functionally intact. No obvious neuro-muscular anomalies detected.  Sensory (Neurological): Unimpaired        Sensory (Neurological): Unimpaired        DTR: Patellar: deferred today Achilles: deferred today Plantar: deferred today  DTR: Patellar: deferred today Achilles: deferred today Plantar: deferred today  Palpation: No palpable anomalies  Palpation: No palpable anomalies    Assessment  Primary Diagnosis & Pertinent Problem List: The primary encounter diagnosis was Lumbar facet arthropathy. Diagnoses of Lumbar spondylosis, Cervical radicular pain, Alcohol use disorder, Depression, recurrent (HCC), Bilateral carpal tunnel syndrome, and Chronic pain syndrome were also pertinent to this visit.  Visit Diagnosis (New problems to examiner): 1. Lumbar facet arthropathy   2. Lumbar spondylosis   3. Cervical radicular pain   4. Alcohol use disorder   5. Depression, recurrent (HCC)   6. Bilateral carpal tunnel syndrome   7. Chronic pain syndrome    Plan of Care (Initial workup plan)   Problem-specific plan: Assessment and Plan    Lumbar facet arthropathy with Nerve Compression   She exhibits chronic lower back pain radiating to the knees, predominantly on the  right, with an MRI revealing arthritis at L3-L4 and L4-L5 and nerve compression at L3-L4. A positive straight leg raise test supports this diagnosis. She is currently engaged in physical therapy, has received recent hip injections, and completed a steroid taper. We discussed performing medial branch nerve blocks at L3, L4, and L5 as diagnostic tools to assess if pain relief exceeds 50%, which, if successful, could lead to considering nerve ablation. Should the nerve blocks prove ineffective, an epidural injection will be considered. It was explained that nerve blocks do not require general anesthesia, though IV sedation is available. Carl ONEIDA Mulch has a history of greater than 3 months of moderate to severe pain which is resulted in functional impairment.  The patient has tried various conservative therapeutic options such as NSAIDs, Tylenol , muscle relaxants, physical therapy which was inadequately effective.  Patient's pain is predominantly axial with physical exam and L-MRI  findings suggestive of facet arthropathy. Lumbar facet medial branch nerve blocks were discussed with the patient.  Risks and benefits were reviewed.  Patient would like to proceed with bilateral L3, L4, L5 medial branch nerve block.  Cervical Spine Pain   She reports chronic neck pain extending to both shoulders and arms, accompanied by numbness and tingling, which worsens when lying down. A recent cervical spine x-ray has been performed. She also  experiences difficulty raising her legs from a sitting position due to back pain. An epidural injection for the cervical spine pain and the numbness/tingling in the shoulders has been discussed.  As needed cervical ESI  Carpal Tunnel Syndrome   She has chronic carpal tunnel syndrome in the right hand, characterized by pain, spasms, and tingling in both hands, difficulty holding utensils, and occasional swelling and inability to bend the right pinky finger. Diagnosed years ago, she has not  used recent bracing. We discussed the potential benefits of a wrist brace, heat therapy, and a TENS unit as therapy options.   Patient states that she will think about the options that we have discussed and give us  a call if she would like to move forward with either her lumbar medial branch nerve block or her cervical epidural steroid injection.   Procedure Orders         LUMBAR FACET(MEDIAL BRANCH NERVE BLOCK) MBNB         Cervical Epidural Injection     Orders Placed This Encounter  Procedures   LUMBAR FACET(MEDIAL BRANCH NERVE BLOCK) MBNB    Standing Status:   Standing    Number of Occurrences:   3    Next Expected Occurrence:   03/17/2024    Expiration Date:   06/26/2024    Scheduling Instructions:     Procedure: Lumbar facet block (AKA.: Lumbosacral medial branch nerve block)     Side: Bilateral     Level: L3-4, L4-5, Facets (L3, L4, L5, Medial Branch)     Sedation: Patient's choice.     Timeframe: PRN    Where will this procedure be performed?:   [facility]   Cervical Epidural Injection    Sedation: Patient's choice. Purpose: Diagnostic/Therapeutic Indication(s): Radiculitis and cervicalgia associater with cervical degenerative disc disease.    Standing Status:   Standing    Number of Occurrences:   2    Next Expected Occurrence:   03/17/2024    Expiration Date:   06/26/2024    Scheduling Instructions:     Procedure: Cervical Epidural Steroid Injection/Block     Level(s): C7-T1     Laterality: TBD     Timeframe: PRN    Where will this procedure be performed?:   [facility]             Chijioke Lasser     Provider-requested follow-up: Return for patient will call to schedule PRCOEDURE appt prn.  Future Appointments  Date Time Provider Department Center  01/06/2024 10:40 AM Branch, Dorn FALCON, MD [facility]  01/12/2024 10:30 AM Rudy Josette RAMAN, PA-C [facility]  02/23/2024 10:20 AM [name], FNP [facility]    Duration of  encounter: .  Total time on encounter, as per AMA guidelines included both the face-to-face and non-face-to-face time personally spent by the physician and/or other qualified health care professional(s) on the day of the encounter (includes time in activities that require the physician or other qualified health care professional and does not include time in activities normally performed by clinical staff). Physician's time may include the following activities when performed: Preparing to see the patient (e.g., pre-charting review of records, searching for previously ordered imaging, lab work, and nerve conduction tests) Review of prior analgesic pharmacotherapies. Reviewing PMP Interpreting ordered tests (e.g., lab work, imaging, nerve conduction tests) Performing post-procedure evaluations, including interpretation of diagnostic procedures Obtaining and/or reviewing separately obtained history Performing a medically appropriate examination and/or evaluation Counseling and  educating the patient/family/caregiver Ordering medications, tests, or procedures Referring and communicating with other health care professionals (when not separately reported) Documenting clinical information in the electronic or other health record Independently interpreting results (not separately reported) and communicating results to the patient/ family/caregiver Care coordination (not separately reported)  Note by: Wallie Sherry, MD (AI and TTS technology used. I apologize for any typographical errors that were not detected and corrected.) Date: 12/28/2023; Time: 10:33 AM

## 2024-01-03 ENCOUNTER — Telehealth: Payer: Self-pay

## 2024-01-03 NOTE — Telephone Encounter (Signed)
 Copied from CRM [id number]. Topic: General - Other >> Dec 31, 2023  3:09 PM Twila L wrote: Reason for CRM:  patient needs PCA form faxed to [facility] TEAM  [phone]   ATTN Sharon dennis  [phone]   case worke

## 2024-01-04 ENCOUNTER — Telehealth: Payer: Self-pay | Admitting: Family Medicine

## 2024-01-04 NOTE — Telephone Encounter (Signed)
 The patient has to have medicaid to be eligible for PCS and their medicaid ID must be written on the form and we do not have any medicaid on file for her. If this is incorrect, she needs to bring the medicaid information to the office before we can fill out a PCS form.

## 2024-01-04 NOTE — Telephone Encounter (Signed)
 Copied from CRM [id number]. Topic: General - Other >> Jan 04, 2024 11:11 AM Lorenz Romano B wrote: Reason for CRM: [facility] care manager is calling and they want to know if they can get  request for independent personal service assessment faxed over from 10/14/2023 fax to [phone] attention [facility] assessment team

## 2024-01-05 NOTE — Telephone Encounter (Signed)
 Form completed and put in Iliana's box to sign with note to return to me to fax

## 2024-01-06 ENCOUNTER — Ambulatory Visit: Payer: MEDICAID | Attending: Cardiology | Admitting: Cardiology

## 2024-01-06 ENCOUNTER — Encounter: Payer: Self-pay | Admitting: Cardiology

## 2024-01-06 ENCOUNTER — Ambulatory Visit: Payer: MEDICAID

## 2024-01-06 VITALS — BP 122/84 | HR 110 | Ht 63.0 in | Wt 188.0 lb

## 2024-01-06 DIAGNOSIS — R002 Palpitations: Secondary | ICD-10-CM

## 2024-01-06 DIAGNOSIS — R0602 Shortness of breath: Secondary | ICD-10-CM | POA: Diagnosis not present

## 2024-01-06 NOTE — Patient Instructions (Signed)
Medication Instructions:  Your physician recommends that you continue on your current medications as directed. Please refer to the Current Medication list given to you today.  *If you need a refill on your cardiac medications before your next appointment, please call your pharmacy*   Lab Work: None If you have labs (blood work) drawn today and your tests are completely normal, you will receive your results only by: MyChart Message (if you have MyChart) OR A paper copy in the mail If you have any lab test that is abnormal or we need to change your treatment, we will call you to review the results.   Testing/Procedures: Your physician has recommended that you have a pulmonary function test. Pulmonary Function Tests are a group of tests that measure how well air moves in and out of your lungs.    Follow-Up: At [facility], you and your health needs are our priority.  As part of our continuing mission to provide you with exceptional heart care, we have created designated Provider Care Teams.  These Care Teams include your primary Cardiologist (physician) and Advanced Practice Providers (APPs -  Physician Assistants and Nurse Practitioners) who all work together to provide you with the care you need, when you need it.  We recommend signing up for the patient portal called "MyChart".  Sign up information is provided on this After Visit Summary.  MyChart is used to connect with patients for Virtual Visits (Telemedicine).  Patients are able to view lab/test results, encounter notes, upcoming appointments, etc.  Non-urgent messages can be sent to your provider as well.   To learn more about what you can do with MyChart, go to [email or web address].    Your next appointment:   6 week(s)  Provider:   You will see one of the following Advanced Practice Providers on your designated Care Team:   Randall An, PA-C  Jacolyn Reedy, PA-C       Other Instructions Heidi [name]- Long Term  Monitor Instructions   Your physician has requested you wear your ZIO patch monitor___7____days.   This is a single patch monitor.  [employer] supplies one patch monitor per enrollment.  Additional stickers are not available.   Please do not apply patch if you will be having a Nuclear Stress Test, Echocardiogram, Cardiac CT, MRI, or Chest Xray during the time frame you would be wearing the monitor. The patch cannot be worn during these tests.  You cannot remove and re-apply the ZIO XT patch monitor.   Your ZIO patch monitor will be sent USPS Priority mail from [facility] directly to your home address. The monitor may also be mailed to a PO BOX if home delivery is not available.   It may take 3-5 days to receive your monitor after you have been enrolled.   Once you have received you monitor, please review enclosed instructions.  Your monitor has already been registered assigning a specific monitor serial # to you.   Applying the monitor   Shave hair from upper left chest.   Hold abrader disc by orange tab.  Rub abrader in 40 strokes over left upper chest as indicated in your monitor instructions.   Clean area with 4 enclosed alcohol pads .  Use all pads to assure are is cleaned thoroughly.  Let dry.   Apply patch as indicated in monitor instructions.  Patch will be place under collarbone on left side of chest with arrow pointing upward.   Rub patch adhesive wings for 2 minutes.Remove  white label marked "1".  Remove white label marked "2".  Rub patch adhesive wings for 2 additional minutes.   While looking in a mirror, press and release button in center of patch.  A small green light will flash 3-4 times .  This will be your only indicator the monitor has been turned on.     Do not shower for the first 24 hours.  You may shower after the first 24 hours.   Press button if you feel a symptom. You will hear a small click.  Record Date, Time and Symptom in the Patient Log Book.   When  you are ready to remove patch, follow instructions on last 2 pages of Patient Log Book.  Stick patch monitor onto last page of Patient Log Book.   Place Patient Log Book in [place] box.  Use locking tab on box and tape box closed securely.  The [employer] and [employer] has [employer] on it.  Please place in mailbox as soon as possible.  Your physician should have your test results approximately 7 days after the monitor has been mailed back to [facility].   Call [facility] Customer Care at [phone] if you have questions regarding your ZIO XT patch monitor.  Call them immediately if you see an orange light blinking on your monitor.   If your monitor falls off in less than 4 days contact our Monitor department at [phone].  If your monitor becomes loose or falls off after 4 days call [employer] at [phone] for suggestions on securing your monitor.

## 2024-01-06 NOTE — Progress Notes (Signed)
Clinical Summary Ms. Copeman is a 53 y.o.female seen today as a new consult, referred by NP Lonna Cobb Polanco for the following medical problems.   1.Tachycardia - 09/23/23 during pcp visit HR 115 by vitals, EKG showed sinus rhythm at 97 - 10/26/23 visit HR 120s  - can have some palpitations, occurs 1-2 times per week. No specific pattern. Lasts few minutes. +Dizzy, some SOB.  - rare coffee, sodas x 5 bottles, no energy drinks. No EtOH.      2. SOB - roughly 6 months. SOB with mild activities - nightime cough, dry cough. No wheezing.  - no recent edema - no chest pains.   - history of tobacco use over 30 years.  - nevery formally tested fro COPD - inhaler can help breathing.  Past Medical History:  Diagnosis Date   Alcohol withdrawal (HCC)    Anxiety    Cervical radiculopathy    Depression    GERD (gastroesophageal reflux disease)    Hypertension      No Known Allergies   Current Outpatient Medications  Medication Sig Dispense Refill   albuterol (VENTOLIN HFA) 108 (90 Base) MCG/ACT inhaler Inhale 2 puffs into the lungs every 6 (six) hours as needed for wheezing or shortness of breath. 8 g 2   ALPRAZolam (XANAX) 0.25 MG tablet Take 1 tablet (0.25 mg total) by mouth at bedtime as needed for anxiety. 30 tablet 0   celecoxib (CELEBREX) 200 MG capsule Take 1 capsule (200 mg total) by mouth 2 (two) times daily. 20 capsule 0   citalopram (CELEXA) 20 MG tablet Take 20 mg by mouth daily.     cloNIDine (CATAPRES) 0.1 MG tablet TAKE ONE TABLET BY MOUTH EVERY DAY 30 tablet 3   cyclobenzaprine (FLEXERIL) 5 MG tablet TAKE 1 TABLET BY MOUTH (3) TIMES A DAY AS NEEDED FOR MUSCLE SPASMS. 30 tablet 0   diazepam (VALIUM) 5 MG tablet Take 5 mg by mouth as needed.     diltiazem (CARDIZEM CD) 120 MG 24 hr capsule Take 1 capsule (120 mg total) by mouth daily. 30 capsule 1   ferrous sulfate 325 (65 FE) MG tablet Take 325 mg by mouth daily with breakfast.     folic acid (FOLVITE) 1 MG tablet  Take 1 tablet (1 mg total) by mouth daily. 30 tablet 3   gabapentin (NEURONTIN) 400 MG capsule Take 1 capsule (400 mg total) by mouth 3 (three) times daily. 60 capsule 5   lidocaine (LIDODERM) 5 % Place 1 patch onto the skin daily. Remove & Discard patch within 12 hours or as directed by MD 30 patch 0   methylPREDNISolone (MEDROL DOSEPAK) 4 MG TBPK tablet Use as directed 21 tablet 0   metoprolol tartrate (LOPRESSOR) 25 MG tablet Take 0.5 tablets (12.5 mg total) by mouth 2 (two) times daily. 60 tablet 2   naltrexone (DEPADE) 50 MG tablet Take 1 tablet (50 mg total) by mouth daily. 30 tablet 2   nicotine (NICODERM CQ - DOSED IN MG/24 HOURS) 14 mg/24hr patch 14 mg daily.     Omega-3 Fatty Acids (FISH OIL) 1000 MG CAPS Take 1 capsule (1,000 mg total) by mouth 2 (two) times daily. 90 capsule 3   pantoprazole (PROTONIX) 40 MG tablet Take 1 tablet (40 mg total) by mouth 2 (two) times daily. 60 tablet 11   rosuvastatin (CRESTOR) 40 MG tablet Take 1 tablet (40 mg total) by mouth daily. 90 tablet 3   tiZANidine (ZANAFLEX) 4 MG  capsule Take 1 capsule (4 mg total) by mouth 2 (two) times daily as needed for muscle spasms. 60 capsule 2   traZODone (DESYREL) 150 MG tablet Take 150 mg by mouth at bedtime.     No current facility-administered medications for this visit.     Past Surgical History:  Procedure Laterality Date   BIOPSY  11/30/2023   Procedure: BIOPSY;  Surgeon: Lanelle Bal, DO;  [facility];  Service: Endoscopy;;   BRAIN SURGERY     COLONOSCOPY WITH PROPOFOL N/A 11/30/2023   Procedure: COLONOSCOPY WITH PROPOFOL;  Surgeon: Lanelle Bal, DO;  [facility];  Service: Endoscopy;  Laterality: N/A;  10:15 am, asa 3   ESOPHAGOGASTRODUODENOSCOPY (EGD) WITH PROPOFOL N/A 11/30/2023   Procedure: ESOPHAGOGASTRODUODENOSCOPY (EGD) WITH PROPOFOL;  Surgeon: Lanelle Bal, DO;  [facility];  Service: Endoscopy;  Laterality: N/A;   FLEXIBLE SIGMOIDOSCOPY N/A  07/13/2023   Procedure: FLEXIBLE SIGMOIDOSCOPY;  Surgeon: Dolores Frame, MD;  [facility];  Service: Gastroenterology;  Laterality: N/A;   POLYPECTOMY  11/30/2023   Procedure: POLYPECTOMY;  Surgeon: Lanelle Bal, DO;  [facility];  Service: Endoscopy;;     No Known Allergies    Family History  Problem Relation Age of Onset   Stroke Father      Social History Ms. Peine reports that she has been smoking cigarettes. She started smoking about 38 years ago. She has a 19 pack-year smoking history. She has never used smokeless tobacco. Ms. Zierden reports that she does not currently use alcohol after a past usage of about 42.0 standard drinks of alcohol per week.   Review of Systems CONSTITUTIONAL: No weight loss, fever, chills, weakness or fatigue.  HEENT: Eyes: No visual loss, blurred vision, double vision or yellow sclerae.No hearing loss, sneezing, congestion, runny nose or sore throat.  SKIN: No rash or itching.  CARDIOVASCULAR: per hpi RESPIRATORY: per hpi GASTROINTESTINAL: No anorexia, nausea, vomiting or diarrhea. No abdominal pain or blood.  GENITOURINARY: No burning on urination, no polyuria NEUROLOGICAL: No headache, dizziness, syncope, paralysis, ataxia, numbness or tingling in the extremities. No change in bowel or bladder control.  MUSCULOSKELETAL: No muscle, back pain, joint pain or stiffness.  LYMPHATICS: No enlarged nodes. No history of splenectomy.  PSYCHIATRIC: No history of depression or anxiety.  ENDOCRINOLOGIC: No reports of sweating, cold or heat intolerance. No polyuria or polydipsia.  Marland Kitchen   Physical Examination Today's Vitals   01/06/24 1026  BP: 122/84  Pulse: (!) 110  SpO2: 97%  Weight: 188 lb (85.3 kg)  Height: 5\' 3"  (1.6 m)   Body mass index is 33.3 kg/m.  Gen: resting comfortably, no acute distress HEENT: no scleral icterus, pupils equal round and reactive, no palptable cervical adenopathy,  CV: RRR, no  m/rg, no jvd Resp: Clear to auscultation bilaterally GI: abdomen is soft, non-tender, non-distended, normal bowel sounds, no hepatosplenomegaly MSK: extremities are warm, no edema.  Skin: warm, no rash Neuro:  no focal deficits Psych: appropriate affect   Diagnostic Studies     Assessment and Plan  1.Palpitations/tachycardia - plan for 7 day monitor to further evaluate  2. SOB - with some associated coughing, can be better with prn albuterol - has not had formal PFTs, will order - hold on cardiac workup for now as symptoms sound more lung related, most likely COPD. If benign PFTs or ongoing symptoms with treatment of COPD consider echo at that time.  Antoine Poche, M.D.

## 2024-01-07 NOTE — Progress Notes (Signed)
Referring Provider: Wylene Men* Primary Care Physician:  Rica Records, FNP Primary GI Physician: Dr. Marletta Lor  Chief Complaint  Patient presents with   Follow-up    Follow up. Everything is hurting. Swelling everywhere     HPI:   Heidi [name] is a 53 y.o. female with history of hypertension, alcohol abuse, elevated LFTs, extended hospital stay in July with ileus in the setting of alcohol withdrawal developing severe DTs, also noted anemia along with other abnormal findings on CT including peritoneal nodularity and possible splenic laceration versus infarct following flexible sigmoidoscopy for ileus. She is presenting today for follow-up.   Last seen in the office 09/30/2023.  Primary concern from patient was abdominal distention.  Suspected this was related to weight gain from dietary indiscretion, eating high caloric foods since alcohol abstinence, but planned to update an ultrasound to rule out ascites.  Also discussed dietary changes to help with weight loss.  Otherwise, patient had no significant GI symptoms.  Bowels are moving well with MiraLAX as needed. Maintaining on daily PPI (started for early satiety and daily NSAID use).  No overt GI bleeding.  Remained abstinent from alcohol.  She was still taking 4 ibuprofen daily.  Recommended updating labs, arrange EGD and colonoscopy for anemia, limiting ibuprofen, and noted patient would be due for surveillance CT in November to follow-up on splenic lesion and peritoneal nodularity noted on prior CT.  Last completed 09/30/2023:  Hemoglobin stable at 11.5, CMP within normal limits, iron panel within normal limits.   Abdominal ultrasound 10/01/2023 with hepatic steatosis, no ascites.  Surveillance CT abdomen pelvis with contrast was ordered, but never completed.   EGD 11/30/2023: Small hiatal hernia, gastritis biopsied, duodenitis.  Pathology with reactive gastropathy and focal erosion, negative for H. pylori.  Recommended PPI BID.   Colonoscopy 11/30/2023: 5 mm polyp resected and retrieved, stool throughout the entire colon.  Pathology with tubular adenoma.  Recommended 5-year surveillance.    Today: Reports she continues to gain weight and is not quite sure why. Abdomen is getting bigger. Having swelling in her hands and terrible pain in her wrist and back, shoulders, knees. Was on steroids earlier this month for arthritis pain.   Eating a lot of sweats.  24-hour diet recall: Chicken rice casserole, broccoli cheese casserole for breakfast, bologna sandwich and beans for lunch, chicken and rice casserole, whole single serving pie, and Hershey bar at dinner.   Has some tenderness to her abdomen when she touches it, but no pain when not touching it. No nausea or vomiting. Eating well. Some cramping pain in the upper abdomen and moves to the lower abdomen about 20 minutes after eating. Pain lasts couple of minutes. No more than 5 minutes. May be gas pain.   Having a BM every other day. Good and productive. Taking MiraLAX about every other day. No brbpr or melena.   Ibuprofen- Taking 9, 800 mg ibuprofen a day.  Tylenol- 2 tylenol TID.   Wt Readings from Last 10 Encounters:  01/12/24 190 lb (86.2 kg)  01/06/24 188 lb (85.3 kg)  12/28/23 184 lb (83.5 kg)  12/18/23 178 lb (80.7 kg)  11/30/23 178 lb 2.1 oz (80.8 kg)  11/25/23 178 lb 2.1 oz (80.8 kg)  10/26/23 178 lb 0.6 oz (80.8 kg)  10/11/23 160 lb (72.6 kg)  10/01/23 160 lb (72.6 kg)  09/30/23 162 lb 3.2 oz (73.6 kg)     Past Medical History:  Diagnosis Date  Alcohol withdrawal (HCC)    Anxiety    Cervical radiculopathy    Depression    GERD (gastroesophageal reflux disease)    Hypertension     Past Surgical History:  Procedure Laterality Date   BIOPSY  11/30/2023   Procedure: BIOPSY;  Surgeon: Lanelle Bal, DO;  [facility];  Service: Endoscopy;;   BRAIN SURGERY     COLONOSCOPY WITH PROPOFOL N/A 11/30/2023    Procedure: COLONOSCOPY WITH PROPOFOL;  Surgeon: Lanelle Bal, DO;  [facility];  Service: Endoscopy;  Laterality: N/A;  10:15 am, asa 3   ESOPHAGOGASTRODUODENOSCOPY (EGD) WITH PROPOFOL N/A 11/30/2023   Procedure: ESOPHAGOGASTRODUODENOSCOPY (EGD) WITH PROPOFOL;  Surgeon: Lanelle Bal, DO;  [facility];  Service: Endoscopy;  Laterality: N/A;   FLEXIBLE SIGMOIDOSCOPY N/A 07/13/2023   Procedure: FLEXIBLE SIGMOIDOSCOPY;  Surgeon: Dolores Frame, MD;  [facility];  Service: Gastroenterology;  Laterality: N/A;   POLYPECTOMY  11/30/2023   Procedure: POLYPECTOMY;  Surgeon: Lanelle Bal, DO;  [facility];  Service: Endoscopy;;    Current Outpatient Medications  Medication Sig Dispense Refill   albuterol (VENTOLIN HFA) 108 (90 Base) MCG/ACT inhaler Inhale 2 puffs into the lungs every 6 (six) hours as needed for wheezing or shortness of breath. 8 g 2   ALPRAZolam (XANAX) 0.25 MG tablet Take 1 tablet (0.25 mg total) by mouth at bedtime as needed for anxiety. 30 tablet 0   celecoxib (CELEBREX) 200 MG capsule Take 1 capsule (200 mg total) by mouth 2 (two) times daily. 20 capsule 0   citalopram (CELEXA) 20 MG tablet Take 20 mg by mouth daily.     cloNIDine (CATAPRES) 0.1 MG tablet TAKE ONE TABLET BY MOUTH EVERY DAY 30 tablet 3   cyclobenzaprine (FLEXERIL) 5 MG tablet TAKE 1 TABLET BY MOUTH (3) TIMES A DAY AS NEEDED FOR MUSCLE SPASMS. 30 tablet 0   diazepam (VALIUM) 5 MG tablet Take 5 mg by mouth as needed.     diltiazem (CARDIZEM CD) 120 MG 24 hr capsule Take 1 capsule (120 mg total) by mouth daily. 30 capsule 1   ferrous sulfate 325 (65 FE) MG tablet Take 325 mg by mouth daily with breakfast.     folic acid (FOLVITE) 1 MG tablet Take 1 tablet (1 mg total) by mouth daily. 30 tablet 3   gabapentin (NEURONTIN) 400 MG capsule Take 1 capsule (400 mg total) by mouth 3 (three) times daily. 60 capsule 5   lidocaine (LIDODERM) 5 % Place 1 patch  onto the skin daily. Remove & Discard patch within 12 hours or as directed by MD 30 patch 0   metoprolol tartrate (LOPRESSOR) 25 MG tablet Take 0.5 tablets (12.5 mg total) by mouth 2 (two) times daily. 60 tablet 2   naltrexone (DEPADE) 50 MG tablet Take 1 tablet (50 mg total) by mouth daily. 30 tablet 2   nicotine (NICODERM CQ - DOSED IN MG/24 HOURS) 14 mg/24hr patch 14 mg daily.     Omega-3 Fatty Acids (FISH OIL) 1000 MG CAPS Take 1 capsule (1,000 mg total) by mouth 2 (two) times daily. 90 capsule 3   pantoprazole (PROTONIX) 40 MG tablet Take 1 tablet (40 mg total) by mouth 2 (two) times daily. 60 tablet 11   rosuvastatin (CRESTOR) 40 MG tablet Take 1 tablet (40 mg total) by mouth daily. 90 tablet 3   tiZANidine (ZANAFLEX) 4 MG capsule Take 1 capsule (4 mg total) by mouth 2 (  two) times daily as needed for muscle spasms. 60 capsule 2   traZODone (DESYREL) 150 MG tablet Take 150 mg by mouth at bedtime.     No current facility-administered medications for this visit.    Allergies as of 01/12/2024   (No Known Allergies)    Family History  Problem Relation Age of Onset   Stroke Father     Social History   Socioeconomic History   Marital status: Widowed    Spouse name: Not on file   Number of children: Not on file   Years of education: Not on file   Highest education level: Not on file  Occupational History   Not on file  Tobacco Use   Smoking status: Every Day    Current packs/day: 0.50    Average packs/day: 0.5 packs/day for 38.1 years (19.0 ttl pk-yrs)    Types: Cigarettes    Start date: 84   Smokeless tobacco: Never  Vaping Use   Vaping status: Never Used  Substance and Sexual Activity   Alcohol use: Not Currently    Alcohol/week: 42.0 standard drinks of alcohol    Types: 42 Cans of beer per week    Comment: 6 pack of beer daily. No alcohol since 05/05/23.   Drug use: No   Sexual activity: Yes  Other Topics Concern   Not on file  Social History Narrative   Not on  file   Social Drivers of Health   Financial Resource Strain: Not on file  Food Insecurity: No Food Insecurity (07/05/2023)   Hunger Vital Sign    Worried About Running Out of Food in the Last Year: Never true    Ran Out of Food in the Last Year: Never true  Transportation Needs: No Transportation Needs (07/05/2023)   PRAPARE - Administrator, Civil Service (Medical): No    Lack of Transportation (Non-Medical): No  Physical Activity: Not on file  Stress: Not on file  Social Connections: Not on file    Review of Systems: Gen: Denies fever, chills, cold or flu like symptoms, pre-syncope, or syncope.   CV: Denies chest pain, palpitations.  Resp: Denies dyspnea, cough.  GI: See HPI  Heme: See HPI  Physical Exam: BP 111/78 (BP Location: Right Arm, Patient Position: Sitting, Cuff Size: Large)   Pulse 71   Temp (!) 96.9 F (36.1 C) (Temporal)   Ht 5\' 3"  (1.6 m)   Wt 190 lb (86.2 kg)   LMP  (Approximate)   BMI 33.66 kg/m  General:   Alert and oriented. No distress noted. Pleasant and cooperative.  Head:  Normocephalic and atraumatic. Eyes:  Conjuctiva clear without scleral icterus. Heart:  S1, S2 present without murmurs appreciated. Lungs:  Clear to auscultation bilaterally. No wheezes, rales, or rhonchi. No distress.  Abdomen:  Obese, +BS, soft, and non-distended. Mild epigastric tenderness to palpation. No rebound or guarding. No HSM or masses noted. Msk:  Symmetrical without gross deformities. Normal posture. Extremities:  Without edema. Neurologic:  Alert and  oriented x4.  Psych:  Normal mood and affect.    Assessment:  Anemia:  Previously noted decline in hemoglobin from 8.5-12 during hospitalization in July 2024 with no overt GI bleeding.  She underwent EGD and colonoscopy in December 2024 showing small hiatal hernia, gastritis, duodenitis, biopsies negative for H. pylori, tubular adenoma removed from the colon, stool throughout the entire colon.  Recommended  5-year surveillance.  Gastritis/duodenitis likely secondary to excessive amounts of NSAIDs which she has continued.  She has been compliant with PPI twice daily and continues to deny overt GI bleeding.  Last labs on file from October with hemoglobin remaining slightly low at 11.5 with normocytic indices, iron panel within normal limits. Will plan to recheck labs at this time. If iron is wnl, will likely discontinue and monitor. She was also counseled on the importance of decreasing NSAID use.   Abnormal CT abdomen/peritoneal nodularity :  CT A/P without contrast 07/12/2023 with moderate amount of low-density ascites with soft tissue stranding throughout the mesenteric fat and possible peritoneal nodularity in the right mid abdomen. Radiologist stated cannot exclude peritoneal carcinomatosis. Notably, follow-up CT A/P with contrast on 7/28 and 8/1 did not redemonstrate any of these findings. This was previously reviewed with Dr. Marletta Lor who recommended repeat CT in 3 months.  I will reorder surveillance CT today as it was never completed.  Splenic lesion:  Possible splenic laceration or infarct noted on CT following flexible sigmoidoscopy in July 2024. Recommended 3 month surveillance CT, but this was never completed. Will re-order.   NSAID long term use/arthralgia:  Chronic NSAID use in setting of arthritis, currently taking way too much ibuprofen (9, 800 mg ibuprofen per day).  Also taking 3000 mg of Tylenol per day.  Patient was counseled on the importance of limiting ibuprofen to 2400 mg/day and following up with PCP or orthopedics for arthritis management.  Epigastric abdominal tenderness:  Reporting epigastric tenderness to palpation only. No pain otherwise. May have some hypersensitivity in this area but could also have persistent gastritis, duodenitis in the setting of way too much NSAID use, currently taking 9, 800 mg ibuprofen per day.  She was advised to reduce her ibuprofen use to no more than  2400 mg/day and to continue PPI twice daily.  EGD is up-to-date as of 11/30/2023 with gastritis and duodenitis.  Weight gain:  Almost 50 pound weight gain in the last 6 months since she stopped drinking alcohol.  Patient has very poor dietary habits, consuming a very high caloric intake on a regular basis. She has also been on steroids recently due to arthritis.  [place] completed in October due to reports of abdominal distention and weight gain and showed hepatic steatosis with no ascites. Her abdomen is more obese today but she has evidence of weight gain all over and no evidence of edema. Discussed need for dietary changes and recommend following up with PCP as she may need further evaluation.   History of elevated LFTs/fatty liver: LFTs normalized following alcohol cessation in July 2024. Most recent labs 09/30/23 with LFTs remaining normal. [place] with hepatic steatosis. Unfortunately, she has very poor dietary habits at this time and has been gaining significant amounts of weight. We spent quite a bit of time today discussing the need for weight loss, low fat/low cholesterol/low sugar diet.   Constipation:  Well controlled with Miralax every other day.    Plan:  CBC, CMP, iron panel CT A/P with contrast Continue Pantoprazole 40 mg BID Continue MiraLAX every other day.  Continue iron daily for now.  No more than 2400 mg ibuprofen per day.  No more than 3000 mg of tylenol per day.  Follow-up with PCP or orthopedics regarding joint pain.  Instructions for fatty liver: Recommend 1-2# weight loss per week until ideal body weight through exercise & diet. Recommended starting with limiting calories to <1500 per day, but may need further reduction to achieve weight loss.  Low fat/cholesterol diet.   Avoid sweets, sodas, fruit juices,  sweetened beverages like tea, etc. Gradually increase exercise from 15 min daily up to 1 hr per day 5 days/week. Follow-up with PCP on weight gain.  Further  recommendations pending labs and CT.    Ermalinda Memos, Cordelia Poche [facility] 01/12/2024

## 2024-01-10 NOTE — Telephone Encounter (Signed)
 Copied from CRM [id number]. Topic: Clinical - Medical Advice >> Jan 10, 2024 12:20 PM Mosetta Putt H wrote: Reason for CRM: requesting blood pressure machine and blood sugar machine, hand brace and a walker

## 2024-01-11 ENCOUNTER — Ambulatory Visit: Payer: Self-pay | Admitting: Family Medicine

## 2024-01-11 ENCOUNTER — Inpatient Hospital Stay (HOSPITAL_COMMUNITY): Admission: RE | Admit: 2024-01-11 | Payer: MEDICAID | Source: Ambulatory Visit

## 2024-01-11 NOTE — Telephone Encounter (Signed)
Please add walker to adapt application

## 2024-01-11 NOTE — Telephone Encounter (Signed)
  Chief Complaint: generalized pain Symptoms: pain throughout her body Frequency: two weeks Pertinent Negatives: Patient denies fever Disposition: [] ED /[x] Urgent Care (no appt availability in office) / [] Appointment(In office/virtual)/ []  [facility]/ [] Home Care/ [] Refused Recommended Disposition /[] [facility]/ []  Follow-up with PCP Additional Notes: patient with hx of chronic pain syndrome c/o generalized pain for two weeks. Patient states her pain is progressive. Per protocol, the recommendation is for Urgent Care as there isn't an appointment available in the office. Patient verbalized understanding of plan and all questions answered.    Copied from CRM [id number]. Topic: Clinical - Red Word Triage >> Jan 11, 2024  1:06 PM Joanette Gula wrote: Red Word that prompted transfer to Nurse Triage: Severe Pain Reason for Disposition  [1] SEVERE pain (e.g., excruciating, unable to do any normal activities) AND [2] not improved 2 hours after pain medicine  Answer Assessment - Initial Assessment Questions 1. ONSET: "When did the muscle aches or body pains start?"      Started two weeks ago 2. LOCATION: "What part of your body is hurting?" (e.g., entire body, arms, legs)      Entire body 3. SEVERITY: "How bad is the pain?" (Scale 1-10; or mild, moderate, severe)   - MILD (1-3): doesn't interfere with normal activities    - MODERATE (4-7): interferes with normal activities or awakens from sleep    - SEVERE (8-10):  excruciating pain, unable to do any normal activities      10 4. CAUSE: "What do you think is causing the pains?"     Does have hx of chronic pain syndrome 5. FEVER: "Have you been having fever?"     No 6. OTHER SYMPTOMS: "Do you have any other symptoms?" (e.g., chest pain, weakness, rash, cold or flu symptoms, weight loss)     headache 8. TRAVEL: "Have you traveled out of the country in the last month?" (e.g., travel history, exposures)     No  Protocols used:  Muscle Aches and Body Pain-A-AH

## 2024-01-12 ENCOUNTER — Telehealth: Payer: Self-pay | Admitting: *Deleted

## 2024-01-12 ENCOUNTER — Encounter: Payer: Self-pay | Admitting: Gastroenterology

## 2024-01-12 ENCOUNTER — Telehealth: Payer: Self-pay | Admitting: Family Medicine

## 2024-01-12 ENCOUNTER — Ambulatory Visit (INDEPENDENT_AMBULATORY_CARE_PROVIDER_SITE_OTHER): Payer: MEDICAID | Admitting: Gastroenterology

## 2024-01-12 VITALS — BP 111/78 | HR 71 | Temp 96.9°F | Ht 63.0 in | Wt 190.0 lb

## 2024-01-12 DIAGNOSIS — D7389 Other diseases of spleen: Secondary | ICD-10-CM | POA: Diagnosis not present

## 2024-01-12 DIAGNOSIS — D649 Anemia, unspecified: Secondary | ICD-10-CM

## 2024-01-12 DIAGNOSIS — K760 Fatty (change of) liver, not elsewhere classified: Secondary | ICD-10-CM

## 2024-01-12 DIAGNOSIS — R635 Abnormal weight gain: Secondary | ICD-10-CM

## 2024-01-12 DIAGNOSIS — M2550 Pain in unspecified joint: Secondary | ICD-10-CM

## 2024-01-12 DIAGNOSIS — R10816 Epigastric abdominal tenderness: Secondary | ICD-10-CM

## 2024-01-12 DIAGNOSIS — R935 Abnormal findings on diagnostic imaging of other abdominal regions, including retroperitoneum: Secondary | ICD-10-CM | POA: Diagnosis not present

## 2024-01-12 DIAGNOSIS — Z791 Long term (current) use of non-steroidal anti-inflammatories (NSAID): Secondary | ICD-10-CM

## 2024-01-12 NOTE — Patient Instructions (Addendum)
Please have blood work complete at [facility].  We will get you scheduled for a CT scan of your abdomen and pelvis to follow-up on prior CT abnormalities.  Continue taking pantoprazole 40 mg twice daily 30 minutes before breakfast and dinner.  Continue MiraLAX every other day.  Continue taking iron daily.  As we discussed, you are taking way too much ibuprofen.  The maximum out of ibuprofen per day is 2400 mg which is three 800 mg pills per day.  The maximum amount of Tylenol that you can take is 3000 mg/day.  You need to follow-up with your primary care doctor and/or your orthopedic specialist regarding your joint pain that seems to be worsening.  You have gained a significant amount of weight in the last 3 months likely secondary to consuming too many calories per day.  I recommend that you cut your calories down to 1500 calories per day or less.   Recommend 1-2# weight loss per week until ideal body weight through exercise & diet. Low fat/cholesterol diet.   Avoid sweets, sodas, fruit juices, sweetened beverages like tea, etc. Gradually increase exercise from 15 min daily up to 1 hr per day 5 days/week.  We will call with your lab and CT results with further recommendations to follow.  Ermalinda Memos, PA-C [facility]enter[facility]

## 2024-01-12 NOTE — Telephone Encounter (Signed)
[employer] PA:  Approval # [id number] DOS: 01/12/24-02/11/24

## 2024-01-12 NOTE — Telephone Encounter (Signed)
[facility]   CT scheduled for Tuesday 02/08/24, arrive at 9:15 am to check in and this is a work in appointment.

## 2024-01-12 NOTE — Telephone Encounter (Signed)
PCS forms   Copied Noted Sleeved (put in provider box)  Fax to [phone]  And   Call patient when ready to pick up a copy

## 2024-01-13 ENCOUNTER — Encounter: Payer: Self-pay | Admitting: *Deleted

## 2024-01-13 LAB — COMPLETE METABOLIC PANEL WITH GFR
AG Ratio: 2 (calc) (ref 1.0–2.5)
ALT: 10 U/L (ref 6–29)
AST: 9 U/L — ABNORMAL LOW (ref 10–35)
Albumin: 4.3 g/dL (ref 3.6–5.1)
Alkaline phosphatase (APISO): 100 U/L (ref 37–153)
BUN: 7 mg/dL (ref 7–25)
CO2: 21 mmol/L (ref 20–32)
Calcium: 9.9 mg/dL (ref 8.6–10.4)
Chloride: 110 mmol/L (ref 98–110)
Creat: 0.61 mg/dL (ref 0.50–1.03)
Globulin: 2.2 g/dL (ref 1.9–3.7)
Glucose, Bld: 91 mg/dL (ref 65–99)
Potassium: 3.7 mmol/L (ref 3.5–5.3)
Sodium: 139 mmol/L (ref 135–146)
Total Bilirubin: 0.5 mg/dL (ref 0.2–1.2)
Total Protein: 6.5 g/dL (ref 6.1–8.1)
eGFR: 109 mL/min/{1.73_m2} (ref >=60)

## 2024-01-13 LAB — CBC WITH DIFFERENTIAL/PLATELET
Absolute Lymphocytes: 2442 {cells}/uL (ref 850–3900)
Absolute Monocytes: 681 {cells}/uL (ref 200–950)
Basophils Absolute: 37 {cells}/uL (ref 0–200)
Basophils Relative: 0.5 %
Eosinophils Absolute: 111 {cells}/uL (ref 15–500)
Eosinophils Relative: 1.5 %
HCT: 34.4 % — ABNORMAL LOW (ref 35.0–45.0)
Hemoglobin: 11.6 g/dL — ABNORMAL LOW (ref 11.7–15.5)
MCH: 28.4 pg (ref 27.0–33.0)
MCHC: 33.7 g/dL (ref 32.0–36.0)
MCV: 84.3 fL (ref 80.0–100.0)
MPV: 11.7 fL (ref 7.5–12.5)
Monocytes Relative: 9.2 %
Neutro Abs: 4129 {cells}/uL (ref 1500–7800)
Neutrophils Relative %: 55.8 %
Platelets: 217 10*3/uL (ref 140–400)
RBC: 4.08 10*6/uL (ref 3.80–5.10)
RDW: 14 % (ref 11.0–15.0)
Total Lymphocyte: 33 %
WBC: 7.4 10*3/uL (ref 3.8–10.8)

## 2024-01-13 LAB — IRON,TIBC AND FERRITIN PANEL
%SAT: 52 % — ABNORMAL HIGH (ref 16–45)
Ferritin: 161 ng/mL (ref 16–232)
Iron: 145 ug/dL (ref 45–160)
TIBC: 277 ug/dL (ref 250–450)

## 2024-01-13 NOTE — Telephone Encounter (Signed)
Walker order placed

## 2024-01-14 ENCOUNTER — Other Ambulatory Visit: Payer: Self-pay | Admitting: Family Medicine

## 2024-01-14 ENCOUNTER — Encounter: Payer: Self-pay | Admitting: *Deleted

## 2024-01-14 ENCOUNTER — Other Ambulatory Visit: Payer: Self-pay | Admitting: Orthopedic Surgery

## 2024-01-14 DIAGNOSIS — M79641 Pain in right hand: Secondary | ICD-10-CM

## 2024-01-14 DIAGNOSIS — G8929 Other chronic pain: Secondary | ICD-10-CM

## 2024-01-14 DIAGNOSIS — M47812 Spondylosis without myelopathy or radiculopathy, cervical region: Secondary | ICD-10-CM

## 2024-01-14 NOTE — Telephone Encounter (Signed)
Mailed letter with CT appointment date, time and location

## 2024-01-16 ENCOUNTER — Encounter: Payer: Self-pay | Admitting: Gastroenterology

## 2024-01-17 NOTE — Telephone Encounter (Signed)
Called patient , forms faxed and will pick up a copy, are in front folder

## 2024-01-17 NOTE — Telephone Encounter (Signed)
Patient picked up forms.

## 2024-01-20 ENCOUNTER — Other Ambulatory Visit: Payer: Self-pay

## 2024-01-20 ENCOUNTER — Telehealth: Payer: Self-pay | Admitting: Family Medicine

## 2024-01-20 NOTE — Telephone Encounter (Signed)
Which ones?

## 2024-01-20 NOTE — Telephone Encounter (Signed)
Copied from CRM [id number]. Topic: Clinical - Prescription Issue >> Jan 20, 2024  9:18 AM Geroge Baseman wrote: Reason for CRM: Patient is trying to get medications refilled and the pharmacy said to contact the office and let them know they need approval.

## 2024-01-20 NOTE — Telephone Encounter (Signed)
Which medications does the patient need refills on?

## 2024-01-20 NOTE — Telephone Encounter (Signed)
Copied from CRM [id number]. Topic: Referral - Question >> Jan 20, 2024  9:20 AM Geroge Baseman wrote: Reason for CRM: Patient wants to be contacted about referral to occupational and physical therapy. Said she needs to be scheduled but needs a call to see what next steps are [phone].

## 2024-01-20 NOTE — Progress Notes (Unsigned)
Heidi [name]

## 2024-01-21 NOTE — Progress Notes (Unsigned)
  Intake history:  There were no vitals taken for this visit. There is no height or weight on file to calculate BMI.    WHAT ARE WE SEEING YOU FOR TODAY?   {Location ext ZOXW:96045}  How long has this bothered you? (DOI?DOS?WS?)  {TIME; AGO:17960}  Anticoag.  {yes/no:20286}  Diabetes {yes/no:20286}  Heart disease {yes/no:20286}  Hypertension {yes/no:20286}  SMOKING HX {yes/no:20286}  Kidney disease {yes/no:20286}  Any ALLERGIES ______________________________________________   Treatment:  Have you taken:  Tylenol {yes/no:20286}  Advil {yes/no:20286}  Had PT {yes/no:20286}  Had injection {yes/no:20286}  Other  _________________________

## 2024-01-21 NOTE — Telephone Encounter (Signed)
Pt. Request refills of all medications you prescribe:   Flexeril Lidocaine Depade Omega 3 Albuterol Clonidine Crestor  Pt also requested pain medication to be refilled but those are prescription you do not currently prescribe. Please advise on if you would approve those medications to be filled .    Thank you.

## 2024-01-24 ENCOUNTER — Ambulatory Visit: Payer: MEDICAID | Admitting: Orthopedic Surgery

## 2024-01-24 ENCOUNTER — Encounter: Payer: Self-pay | Admitting: *Deleted

## 2024-01-24 ENCOUNTER — Other Ambulatory Visit: Payer: Self-pay | Admitting: Family Medicine

## 2024-01-24 ENCOUNTER — Other Ambulatory Visit: Payer: Self-pay

## 2024-01-24 ENCOUNTER — Encounter: Payer: Self-pay | Admitting: Orthopedic Surgery

## 2024-01-24 ENCOUNTER — Other Ambulatory Visit (INDEPENDENT_AMBULATORY_CARE_PROVIDER_SITE_OTHER): Payer: MEDICAID

## 2024-01-24 VITALS — BP 139/122 | HR 78 | Ht 63.0 in | Wt 188.0 lb

## 2024-01-24 DIAGNOSIS — M19041 Primary osteoarthritis, right hand: Secondary | ICD-10-CM

## 2024-01-24 DIAGNOSIS — M79642 Pain in left hand: Secondary | ICD-10-CM

## 2024-01-24 DIAGNOSIS — M79641 Pain in right hand: Secondary | ICD-10-CM | POA: Diagnosis not present

## 2024-01-24 DIAGNOSIS — M51369 Other intervertebral disc degeneration, lumbar region without mention of lumbar back pain or lower extremity pain: Secondary | ICD-10-CM

## 2024-01-24 MED ORDER — NALTREXONE HCL 50 MG PO TABS: 50.0000 mg | ORAL_TABLET | Freq: Every day | ORAL | 2 refills | Status: DC

## 2024-01-24 MED ORDER — LIDOCAINE 5 % EX PTCH: 1.0000 | MEDICATED_PATCH | CUTANEOUS | 2 refills | Status: DC

## 2024-01-24 MED ORDER — CLONIDINE HCL 0.1 MG PO TABS: 0.1000 mg | ORAL_TABLET | Freq: Every day | ORAL | 3 refills | Status: DC

## 2024-01-24 MED ORDER — ROSUVASTATIN CALCIUM 40 MG PO TABS: 40.0000 mg | ORAL_TABLET | Freq: Every day | ORAL | 3 refills | Status: DC

## 2024-01-24 MED ORDER — PREDNISONE 10 MG PO TABS: 10.0000 mg | ORAL_TABLET | Freq: Three times a day (TID) | ORAL | 0 refills | Status: DC

## 2024-01-24 MED ORDER — FISH OIL 1000 MG PO CAPS: 1.0000 | ORAL_CAPSULE | Freq: Two times a day (BID) | ORAL | 3 refills | Status: DC

## 2024-01-24 MED ORDER — ALBUTEROL SULFATE HFA 108 (90 BASE) MCG/ACT IN AERS: 2.0000 | INHALATION_SPRAY | Freq: Four times a day (QID) | RESPIRATORY_TRACT | 2 refills | Status: DC | PRN

## 2024-01-24 NOTE — Telephone Encounter (Signed)
 sent

## 2024-01-24 NOTE — Progress Notes (Signed)
  Intake history:  BP (!) 139/122   Pulse 78   Ht 5\' 3"  (1.6 m)   Wt 188 lb (85.3 kg)   BMI 33.30 kg/m  Body mass index is 33.3 kg/m.    WHAT ARE WE SEEING YOU FOR TODAY?   bilateral hand(s)  How long has this bothered you? (DOI?DOS?WS?)  Few months with right  few weeks left hand pain and swelling  Anticoag.  No  Diabetes No  Heart disease No  Hypertension Yes  SMOKING HX No  Kidney disease No  Any ALLERGIES ______________________________________________   Treatment:  Have you taken:  Tylenol Yes  Advil Yes  Had PT No  Had injection No  Other  _________________________  Impression: The above electrodiagnostic study is ABNORMAL and reveals evidence of a moderate right median nerve entrapment at the wrist (carpal tunnel syndrome) affecting sensory and motor components.    There is no significant electrodiagnostic evidence of any other focal nerve entrapment, brachial plexopathy or cervical radiculopathy.    Recommendations: 1.  Follow-up with referring physician. 2.  Continue current management of symptoms. 3.  Continue use of resting splint at night-time and as needed during the day. 4.  Suggest surgical evaluation.   Heidi [name] comes in with pain in both hands on the dorsum of both hands.  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    Dispense:  42 tablet    Refill:  0    DG Hand Complete Right Result Date: 01/24/2024 Right hand x-ray pain over the  dorsum of the right hand with no degenerative changes bony abnormalities Impression normal right hand   DG Hand Complete Left Result Date: 01/24/2024 Left hand imaging Pain dorsum of the hand and wrist X-rays show normal alignment no bony abnormalities normal joint spaces Impression normal left hand   F/u prn

## 2024-01-25 ENCOUNTER — Telehealth: Payer: Self-pay | Admitting: Family Medicine

## 2024-01-25 NOTE — Telephone Encounter (Signed)
Spoke to [place] [facility] the pt has to bring the form that was sent to her from the [employer] program to this office for provider addendum. Pt. Has been informed of need and will find form and bring back in.

## 2024-01-25 NOTE — Telephone Encounter (Signed)
 Copied from CRM [id number]. Topic: General - Other >> Jan 25, 2024  9:03 AM Powell HERO wrote: Reason for CRM: Patient received a letter stating independent personal service assessment was never faxed over and she needs the office to sent that over. Please reach out advise [phone].

## 2024-01-25 NOTE — Telephone Encounter (Signed)
 Copied from CRM [id number]. Topic: General - Other >> Jan 25, 2024  9:58 AM Bascom RAMAN wrote: Reason for CRM: Form not filled correctly for [employer] certification. Question was missed on form about if she would meet the same criteria if she were in a rest home. Callback number is [phone]

## 2024-01-25 NOTE — Telephone Encounter (Signed)
 Pt. Informed.

## 2024-01-25 NOTE — Telephone Encounter (Signed)
 Copied from CRM [id number]. Topic: Referral - Request for Referral >> Jan 25, 2024  9:25 AM Antonio DEL wrote: Did the patient discuss referral with their provider in the last year? Yes (If No - schedule appointment) (If Yes - send message)  Appointment offered? Yes  Type of order/referral and detailed reason for visit: Physical Therapy and Occupational Therapy  Preference of office, provider, location: Patient stated she was going to [facility] but they told her she needed a referral from her doctor   If referral order, have you been seen by this specialty before? Yes (If Yes, this issue or another issue? When? Where?   Can we respond through MyChart? Yes

## 2024-01-25 NOTE — Telephone Encounter (Signed)
Form refaxed to [place] [facility]

## 2024-02-03 ENCOUNTER — Other Ambulatory Visit: Payer: Self-pay | Admitting: Family Medicine

## 2024-02-03 ENCOUNTER — Telehealth: Payer: Self-pay | Admitting: Cardiology

## 2024-02-03 NOTE — Telephone Encounter (Signed)
   Pre-operative Risk Assessment    Patient Name: Heidi [name]  DOB: May 27, 1971 MRN: [id number]      Request for Surgical Clearance    Procedure:   extraction 7 teeth   Date of Surgery:  Clearance TBD                                 Surgeon:  Ocie Doyne  Surgeon's Group or Practice Name:  [facility] Phone number:  [phone] Fax number:  [phone]   Type of Clearance Requested:   - Medical    Type of Anesthesia:   IV Sedation    Additional requests/questions:    Lajuana Matte   02/03/2024, 4:38 PM

## 2024-02-04 NOTE — Telephone Encounter (Signed)
   Name: Heidi [name]  DOB: [birth year]  MRN: [id number]  Primary Cardiologist: Dina Rich, MD  Chart reviewed as part of pre-operative protocol coverage. The patient has an upcoming visit scheduled with Sharlene Dory, NP on 02/11/2024 at which time clearance can be addressed in case there are any issues that would impact surgical recommendations.  I added preop FYI to appointment note so that provider is aware to address at time of outpatient visit.  Per office protocol the cardiology provider should forward their finalized clearance decision and recommendations regarding antiplatelet therapy to the requesting party below.     I will route this message as FYI to requesting party and remove this message from the preop box as separate preop APP input not needed at this time.   Please call with any questions.  Napoleon Form, Leodis Rains, NP  02/04/2024, 7:07 AM

## 2024-02-08 ENCOUNTER — Ambulatory Visit (HOSPITAL_COMMUNITY)
Admission: RE | Admit: 2024-02-08 | Discharge: 2024-02-08 | Disposition: A | Discharge status: Home / Self Care | Payer: MEDICAID | Source: Ambulatory Visit | Attending: Gastroenterology

## 2024-02-08 ENCOUNTER — Emergency Department (HOSPITAL_COMMUNITY): Payer: MEDICAID

## 2024-02-08 ENCOUNTER — Encounter: Payer: Self-pay | Admitting: Family Medicine

## 2024-02-08 ENCOUNTER — Other Ambulatory Visit: Payer: Self-pay

## 2024-02-08 ENCOUNTER — Observation Stay (HOSPITAL_COMMUNITY)
Admission: EM | Admit: 2024-02-08 | Discharge: 2024-02-08 | Disposition: A | Discharge status: Home / Self Care | Payer: MEDICAID | Attending: Family Medicine | Admitting: Family Medicine

## 2024-02-08 ENCOUNTER — Encounter (HOSPITAL_COMMUNITY): Payer: Self-pay

## 2024-02-08 DIAGNOSIS — Z79899 Other long term (current) drug therapy: Secondary | ICD-10-CM | POA: Insufficient documentation

## 2024-02-08 DIAGNOSIS — D7389 Other diseases of spleen: Secondary | ICD-10-CM | POA: Diagnosis not present

## 2024-02-08 DIAGNOSIS — R0789 Other chest pain: Principal | ICD-10-CM | POA: Insufficient documentation

## 2024-02-08 DIAGNOSIS — R000 Tachycardia, unspecified: Secondary | ICD-10-CM | POA: Insufficient documentation

## 2024-02-08 DIAGNOSIS — I10 Essential (primary) hypertension: Secondary | ICD-10-CM | POA: Diagnosis not present

## 2024-02-08 DIAGNOSIS — E876 Hypokalemia: Secondary | ICD-10-CM | POA: Insufficient documentation

## 2024-02-08 DIAGNOSIS — R0602 Shortness of breath: Secondary | ICD-10-CM | POA: Diagnosis not present

## 2024-02-08 DIAGNOSIS — R079 Chest pain, unspecified: Principal | ICD-10-CM | POA: Diagnosis present

## 2024-02-08 DIAGNOSIS — Z7982 Long term (current) use of aspirin: Secondary | ICD-10-CM | POA: Diagnosis not present

## 2024-02-08 DIAGNOSIS — R935 Abnormal findings on diagnostic imaging of other abdominal regions, including retroperitoneum: Secondary | ICD-10-CM | POA: Insufficient documentation

## 2024-02-08 LAB — MAGNESIUM: Magnesium: 2 mg/dL (ref 1.7–2.4)

## 2024-02-08 LAB — BASIC METABOLIC PANEL
Anion gap: 9 (ref 5–15)
BUN: 10 mg/dL (ref 6–20)
CO2: 23 mmol/L (ref 22–32)
Calcium: 9 mg/dL (ref 8.9–10.3)
Chloride: 106 mmol/L (ref 98–111)
Creatinine, Ser: 0.92 mg/dL (ref 0.44–1.00)
GFR, Estimated: >60 mL/min (ref >60)
Glucose, Bld: 162 mg/dL — ABNORMAL HIGH (ref 70–99)
Potassium: 3.1 mmol/L — ABNORMAL LOW (ref 3.5–5.1)
Sodium: 138 mmol/L (ref 135–145)

## 2024-02-08 LAB — LIPASE, BLOOD: Lipase: 26 U/L (ref 11–51)

## 2024-02-08 LAB — CBC
HCT: 33.1 % — ABNORMAL LOW (ref 36.0–46.0)
Hemoglobin: 10.9 g/dL — ABNORMAL LOW (ref 12.0–15.0)
MCH: 28.2 pg (ref 26.0–34.0)
MCHC: 32.9 g/dL (ref 30.0–36.0)
MCV: 85.8 fL (ref 80.0–100.0)
Platelets: 258 10*3/uL (ref 150–400)
RBC: 3.86 MIL/uL — ABNORMAL LOW (ref 3.87–5.11)
RDW: 15.6 % — ABNORMAL HIGH (ref 11.5–15.5)
WBC: 14.5 10*3/uL — ABNORMAL HIGH (ref 4.0–10.5)
nRBC: 0 % (ref 0.0–0.2)

## 2024-02-08 LAB — HEPATIC FUNCTION PANEL
ALT: 13 U/L (ref 0–44)
AST: 13 U/L — ABNORMAL LOW (ref 15–41)
Albumin: 3.7 g/dL (ref 3.5–5.0)
Alkaline Phosphatase: 96 U/L (ref 38–126)
Bilirubin, Direct: 0.1 mg/dL (ref 0.0–0.2)
Indirect Bilirubin: 0.7 mg/dL (ref 0.3–0.9)
Total Bilirubin: 0.8 mg/dL (ref 0.0–1.2)
Total Protein: 6.3 g/dL — ABNORMAL LOW (ref 6.5–8.1)

## 2024-02-08 LAB — D-DIMER, QUANTITATIVE: D-Dimer, Quant: 0.55 ug{FEU}/mL — ABNORMAL HIGH (ref 0.00–0.50)

## 2024-02-08 LAB — TROPONIN I (HIGH SENSITIVITY)
Troponin I (High Sensitivity): 14 ng/L (ref <18)
Troponin I (High Sensitivity): 17 ng/L (ref <18)

## 2024-02-08 MED ORDER — ACETAMINOPHEN 325 MG PO TABS
650.0000 mg | ORAL_TABLET | Freq: Once | ORAL | Status: AC
  Administered 2024-02-08: 650 mg via ORAL
  Filled 2024-02-08: qty 2

## 2024-02-08 MED ORDER — ALPRAZOLAM 0.5 MG PO TABS
0.2500 mg | ORAL_TABLET | Freq: Once | ORAL | Status: AC
  Administered 2024-02-08: 0.25 mg via ORAL
  Filled 2024-02-08: qty 1

## 2024-02-08 MED ORDER — IOHEXOL 300 MG/ML  SOLN
100.0000 mL | Freq: Once | INTRAMUSCULAR | Status: AC | PRN
  Administered 2024-02-08: 100 mL via INTRAVENOUS

## 2024-02-08 MED ORDER — ENOXAPARIN SODIUM 40 MG/0.4ML IJ SOSY: 40.0000 mg | PREFILLED_SYRINGE | INTRAMUSCULAR | Status: DC

## 2024-02-08 MED ORDER — ALUM & MAG HYDROXIDE-SIMETH 200-200-20 MG/5ML PO SUSP
30.0000 mL | Freq: Once | ORAL | Status: AC
  Administered 2024-02-08: 30 mL via ORAL
  Filled 2024-02-08: qty 30

## 2024-02-08 MED ORDER — IOHEXOL 350 MG/ML SOLN
60.0000 mL | Freq: Once | INTRAVENOUS | Status: AC | PRN
  Administered 2024-02-08: 60 mL via INTRAVENOUS

## 2024-02-08 MED ORDER — MAGNESIUM HYDROXIDE 400 MG/5ML PO SUSP: 30.0000 mL | Freq: Every day | ORAL | Status: DC | PRN

## 2024-02-08 MED ORDER — SODIUM CHLORIDE 0.9 % IV SOLN: INTRAVENOUS | Status: DC

## 2024-02-08 MED ORDER — ALPRAZOLAM 0.5 MG PO TABS: 0.2500 mg | ORAL_TABLET | Freq: Two times a day (BID) | ORAL | Status: DC | PRN

## 2024-02-08 MED ORDER — MORPHINE SULFATE (PF) 2 MG/ML IV SOLN: 2.0000 mg | INTRAVENOUS | Status: DC | PRN

## 2024-02-08 MED ORDER — ASPIRIN 81 MG PO CHEW
324.0000 mg | CHEWABLE_TABLET | Freq: Once | ORAL | Status: AC
  Administered 2024-02-08: 324 mg via ORAL
  Filled 2024-02-08: qty 4

## 2024-02-08 MED ORDER — ONDANSETRON HCL 4 MG/2ML IJ SOLN: 4.0000 mg | Freq: Four times a day (QID) | INTRAMUSCULAR | Status: DC | PRN

## 2024-02-08 MED ORDER — ACETAMINOPHEN 325 MG PO TABS: 650.0000 mg | ORAL_TABLET | ORAL | Status: DC | PRN

## 2024-02-08 MED ORDER — FAMOTIDINE 20 MG PO TABS
20.0000 mg | ORAL_TABLET | Freq: Once | ORAL | Status: AC
  Administered 2024-02-08: 20 mg via ORAL
  Filled 2024-02-08: qty 1

## 2024-02-08 MED ORDER — TRAZODONE HCL 50 MG PO TABS: 25.0000 mg | ORAL_TABLET | Freq: Every evening | ORAL | Status: DC | PRN

## 2024-02-08 MED ORDER — DIAZEPAM 5 MG PO TABS
5.0000 mg | ORAL_TABLET | Freq: Once | ORAL | Status: AC
  Administered 2024-02-08: 5 mg via ORAL
  Filled 2024-02-08: qty 1

## 2024-02-08 MED ORDER — POTASSIUM CHLORIDE CRYS ER 20 MEQ PO TBCR
40.0000 meq | EXTENDED_RELEASE_TABLET | Freq: Once | ORAL | Status: AC
  Administered 2024-02-08: 40 meq via ORAL
  Filled 2024-02-08: qty 2

## 2024-02-08 MED ORDER — LIDOCAINE VISCOUS HCL 2 % MT SOLN
15.0000 mL | Freq: Once | OROMUCOSAL | Status: AC
  Administered 2024-02-08: 15 mL via ORAL
  Filled 2024-02-08: qty 15

## 2024-02-08 MED ORDER — KETOROLAC TROMETHAMINE 30 MG/ML IJ SOLN
30.0000 mg | Freq: Once | INTRAMUSCULAR | Status: AC
  Administered 2024-02-08: 30 mg via INTRAVENOUS
  Filled 2024-02-08: qty 1

## 2024-02-08 NOTE — ED Triage Notes (Signed)
Pt arrived via POV c/o left chest pain. Pt reports pain is worse with movement. Pt reports pain has been on-going X 1 week.

## 2024-02-08 NOTE — ED Provider Notes (Addendum)
Pt signed out by Dr. Durwin Nora pending CT chest.  CT chest reviewed by me.  I agree with the radiologist.  CT chest: Suboptimal opacification of the pulmonary arterial system. No  evidence for acute pulmonary embolus to the lobar level.  2. Emphysema.  3. Trace pericardial effusion.    Emphysema (ICD10-J43.9).    Pt d/w Dr. Arville Care (triad) for admission   Jacalyn Lefevre, MD 02/08/24 1920  Dr. Arville Care put in orders prior to pt getting seen to try to facilitate bed placement. So, she was not seen by the hospitalist.   She then told the nurse she was leaving and left prior to me talking to her again.    Jacalyn Lefevre, MD 02/08/24 1950    Jacalyn Lefevre, MD 02/08/24 2312

## 2024-02-08 NOTE — ED Provider Notes (Addendum)
[place] EMERGENCY DEPARTMENT AT [facility] Provider Note   CSN: [id number] Arrival date & time: 02/08/24  7829     History  Chief Complaint  Patient presents with   Chest Pain    Heidi [name] is a 53 y.o. female.   Chest Pain Associated symptoms: shortness of breath   Patient presents for chest pain.  Medical history includes anxiety, prior alcohol use disorder, depression, GERD, HTN.  She recently establish care with cardiology Dr. Wyline Mood) for tachycardia.  Plan was for 7-day Zio patch.  Over the past week, she has had left-sided chest pain.  This is worsened with ambulation and improves with rest.  She has had some associated shortness of breath.  Currently, she is chest pain-free.  She does continue to smoke.  She has been sober from alcohol for the past year.  She denies any first-degree family history of early cardiac disease.     Home Medications Prior to Admission medications   Medication Sig Start Date End Date Taking? Authorizing Provider  albuterol (VENTOLIN HFA) 108 (90 Base) MCG/ACT inhaler Inhale 2 puffs into the lungs every 6 (six) hours as needed for wheezing or shortness of breath. 01/24/24   Del Nigel Berthold, FNP  ALPRAZolam Prudy Feeler) 0.25 MG tablet Take 1 tablet (0.25 mg total) by mouth at bedtime as needed for anxiety. 10/13/23   Vickki Hearing, MD  celecoxib (CELEBREX) 200 MG capsule Take 1 capsule (200 mg total) by mouth 2 (two) times daily. 12/18/23   Arthor Captain, PA-C  citalopram (CELEXA) 20 MG tablet Take 20 mg by mouth daily. 08/04/23   [provider]  cloNIDine (CATAPRES) 0.1 MG tablet Take 1 tablet (0.1 mg total) by mouth daily. 01/24/24   Del Nigel Berthold, FNP  cyclobenzaprine (FLEXERIL) 5 MG tablet TAKE 1 TABLET BY MOUTH (3) TIMES A DAY AS NEEDED FOR MUSCLE SPASMS. 11/05/23   Del Nigel Berthold, FNP  diazepam (VALIUM) 5 MG tablet Take 5 mg by mouth as needed. 09/09/23   [provider]  diltiazem  (CARDIZEM CD) 120 MG 24 hr capsule Take 1 capsule (120 mg total) by mouth daily. 04/16/23   Vassie Loll, MD  ferrous sulfate 325 (65 FE) MG tablet Take 325 mg by mouth daily with breakfast.    [provider]  folic acid (FOLVITE) 1 MG tablet Take 1 tablet (1 mg total) by mouth daily. 07/25/23   Vassie Loll, MD  gabapentin (NEURONTIN) 400 MG capsule Take 1 capsule (400 mg total) by mouth 3 (three) times daily. 10/11/23   Vickki Hearing, MD  lidocaine (LIDODERM) 5 % Place 1 patch onto the skin daily. Remove & Discard patch within 12 hours or as directed by MD 01/24/24   Del Nigel Berthold, FNP  metoprolol tartrate (LOPRESSOR) 25 MG tablet Take 0.5 tablets (12.5 mg total) by mouth 2 (two) times daily. 07/24/23   Vassie Loll, MD  naltrexone (DEPADE) 50 MG tablet Take 1 tablet (50 mg total) by mouth daily. 01/24/24   Del Newman Nip, Tenna Child, FNP  nicotine (NICODERM CQ - DOSED IN MG/24 HOURS) 14 mg/24hr patch 14 mg daily. 08/09/23   [provider]  Omega-3 Fatty Acids (FISH OIL) 1000 MG CAPS Take 1 capsule (1,000 mg total) by mouth 2 (two) times daily. 01/24/24   Del Nigel Berthold, FNP  pantoprazole (PROTONIX) 40 MG tablet Take 1 tablet (40 mg total) by mouth 2 (two) times daily. 12/07/23 12/06/24  Earnest Bailey  K, DO  predniSONE (DELTASONE) 10 MG tablet Take 1 tablet (10 mg total) by mouth 3 (three) times daily. 01/24/24   Vickki Hearing, MD  rosuvastatin (CRESTOR) 40 MG tablet Take 1 tablet (40 mg total) by mouth daily. 01/24/24   Del Nigel Berthold, FNP  tiZANidine (ZANAFLEX) 2 MG tablet TAKE TWO TABLETS BY MOUTH TWICE DAILY AS NEEDED FOR MUSCLE SPASMS 01/14/24   Vickki Hearing, MD  traZODone (DESYREL) 150 MG tablet Take 150 mg by mouth at bedtime. 08/04/23   [provider]      Allergies    Patient has no known allergies.    Review of Systems   Review of Systems  Respiratory:  Positive for shortness of breath.   Cardiovascular:  Positive  for chest pain.  All other systems reviewed and are negative.   Physical Exam Updated Vital Signs BP (!) 150/86   Pulse 67   Temp 98.2 F (36.8 C) (Oral)   Resp (!) 23   Ht 5\' 3"  (1.6 m)   Wt 85.2 kg   SpO2 98%   BMI 33.27 kg/m  Physical Exam Vitals and nursing note reviewed.  Constitutional:      General: She is not in acute distress.    Appearance: She is well-developed. She is not ill-appearing, toxic-appearing or diaphoretic.  HENT:     Head: Normocephalic and atraumatic.  Eyes:     Conjunctiva/sclera: Conjunctivae normal.  Cardiovascular:     Rate and Rhythm: Normal rate and regular rhythm.     Heart sounds: No murmur heard. Pulmonary:     Effort: Pulmonary effort is normal. No tachypnea or respiratory distress.     Breath sounds: Normal breath sounds. No decreased breath sounds, wheezing, rhonchi or rales.  Chest:     Chest wall: No tenderness.  Abdominal:     Palpations: Abdomen is soft.     Tenderness: There is no abdominal tenderness.  Musculoskeletal:        General: No swelling. Normal range of motion.     Cervical back: Normal range of motion and neck supple.  Skin:    General: Skin is warm and dry.  Neurological:     General: No focal deficit present.     Mental Status: She is alert and oriented to person, place, and time.  Psychiatric:        Mood and Affect: Mood normal.        Behavior: Behavior normal.     ED Results / Procedures / Treatments   Labs (all labs ordered are listed, but only abnormal results are displayed) Labs Reviewed  BASIC METABOLIC PANEL - Abnormal; Notable for the following components:      Result Value   Potassium 3.1 (*)    Glucose, Bld 162 (*)    All other components within normal limits  CBC - Abnormal; Notable for the following components:   WBC 14.5 (*)    RBC 3.86 (*)    Hemoglobin 10.9 (*)    HCT 33.1 (*)    RDW 15.6 (*)    All other components within normal limits  HEPATIC FUNCTION PANEL - Abnormal; Notable  for the following components:   Total Protein 6.3 (*)    AST 13 (*)    All other components within normal limits  D-DIMER, QUANTITATIVE - Abnormal; Notable for the following components:   D-Dimer, Quant 0.55 (*)    All other components within normal limits  LIPASE, BLOOD  MAGNESIUM  TROPONIN  I (HIGH SENSITIVITY)  TROPONIN I (HIGH SENSITIVITY)    EKG EKG Interpretation Date/Time:  Tuesday February 08 2024 09:56:53 EST Ventricular Rate:  89 PR Interval:  158 QRS Duration:  72 QT Interval:  380 QTC Calculation: 462 R Axis:   79  Text Interpretation: Normal sinus rhythm Nonspecific T wave abnormality Confirmed by Gloris Manchester [phone]) on 02/08/2024 10:24:59 AM  Radiology DG Chest 2 View Result Date: 02/08/2024 CLINICAL DATA:  Chest pain EXAM: CHEST - 2 VIEW COMPARISON:  Chest radiograph 07/15/2023 FINDINGS: The heart size and mediastinal contours are within normal limits. Both lungs are clear. The visualized skeletal structures are unremarkable. IMPRESSION: No active cardiopulmonary disease. Electronically Signed   By: Annia Belt M.D.   On: 02/08/2024 10:20    Procedures Procedures    Medications Ordered in ED Medications  alum & mag hydroxide-simeth (MAALOX/MYLANTA) 200-200-20 MG/5ML suspension 30 mL (30 mLs Oral Given 02/08/24 1030)    And  lidocaine (XYLOCAINE) 2 % viscous mouth solution 15 mL (15 mLs Oral Given 02/08/24 1031)  famotidine (PEPCID) tablet 20 mg (20 mg Oral Given 02/08/24 1030)  aspirin chewable tablet 324 mg (324 mg Oral Given 02/08/24 1031)  potassium chloride SA (KLOR-CON M) CR tablet 40 mEq (40 mEq Oral Given 02/08/24 1337)  diazepam (VALIUM) tablet 5 mg (5 mg Oral Given 02/08/24 1337)    ED Course/ Medical Decision Making/ A&P                                 Medical Decision Making Amount and/or Complexity of Data Reviewed Labs: ordered. Radiology: ordered.  Risk OTC drugs. Prescription drug management.   This patient presents to the ED for concern  of chest pain, this involves an extensive number of treatment options, and is a complaint that carries with it a high risk of complications and morbidity.  The differential diagnosis includes ACS, GERD, costochondritis, pericarditis   Co morbidities that complicate the patient evaluation  anxiety, prior alcohol use disorder, depression, GERD, HTN   Additional history obtained:  Additional history obtained from N/A External records from outside source obtained and reviewed including EMR   Lab Tests:  I Ordered, and personally interpreted labs.  The pertinent results include: Baseline anemia, normal kidney function, hypokalemia with otherwise normal electrolytes.  A leukocytosis is present.  D-dimer very slightly elevated.  Troponins are normal x 2   Imaging Studies ordered:  I ordered imaging studies including x-ray, CTA chest I independently visualized and interpreted imaging which showed no acute findings on x-ray.  CTA pending at time of signout. I agree with the radiologist interpretation   Cardiac Monitoring: / EKG:  The patient was maintained on a cardiac monitor.  I personally viewed and interpreted the cardiac monitored which showed an underlying rhythm of: Sinus rhythm   Consultations Obtained:  I requested consultation with the cardiologist, Dr. Wyline Mood,  and discussed lab and imaging findings as well as pertinent plan - they recommend: Possible admission for stress test.  Cardiology to be reengaged following workup.   Problem List / ED Course / Critical interventions / Medication management  Patient presenting for 1 week of intermittent left-sided chest pain.  She reports that this pain worsens with ambulation and improves with rest.  This does raise concern for possible ACS.  EKG does not show any concerning ST segment changes.  When compared to prior EKGs, she does have some T wave flattening.  She is currently chest pain-free at rest.  She is well-appearing.  No  cardiac rubs or murmurs are appreciated on exam.  324 of ASA was ordered.  Workup was initiated.  Patient was also trialed with GI cocktail, stating that her symptoms are reminiscent of prior reflux.  Lab work is notable for leukocytosis and hypokalemia.  Replacement potassium was ordered.  After normal.  D-dimer was slightly elevated.  This did prompt ordering of CTA chest.  After initial normal troponin, I did reach out to patient's cardiologist, Dr. Wyline Mood.  Dr. Wyline Mood recommended further workup and reengagement.  Patient requested her home anxiety medication while in the ED.  Home dose of Valium was ordered.  Patient's second troponin was normal.  She remained chest pain-free at rest.  She did develop a headache and Tylenol was ordered.  CTA was pending at time of signout.  Care of patient was signed out to oncoming ED provider. I ordered medication including ASA for concern of ACS; potassium chloride for hypokalemia; Valium for anxiety, GI cocktail for possible reflux etiology. Reevaluation of the patient after these medicines showed that the patient improved I have reviewed the patients home medicines and have made adjustments as needed   Social Determinants of Health:  Has access to outpatient care        Final Clinical Impression(s) / ED Diagnoses Final diagnoses:  Chest pain, unspecified type    Rx / DC Orders ED Discharge Orders     None         Gloris Manchester, MD 02/08/24 1526    Gloris Manchester, MD 02/08/24 1530

## 2024-02-08 NOTE — ED Notes (Signed)
Pt reports feeling like this pain is caused by her Acid Reflux. Pt reports being out of her medication for this.

## 2024-02-09 ENCOUNTER — Other Ambulatory Visit: Payer: Self-pay | Admitting: Family Medicine

## 2024-02-11 ENCOUNTER — Encounter: Payer: Self-pay | Admitting: Nurse Practitioner

## 2024-02-11 ENCOUNTER — Ambulatory Visit: Payer: MEDICAID | Attending: Nurse Practitioner | Admitting: Nurse Practitioner

## 2024-02-11 VITALS — BP 112/60 | HR 114 | Ht 63.0 in | Wt 203.0 lb

## 2024-02-11 DIAGNOSIS — R079 Chest pain, unspecified: Secondary | ICD-10-CM | POA: Diagnosis not present

## 2024-02-11 DIAGNOSIS — R0602 Shortness of breath: Secondary | ICD-10-CM | POA: Diagnosis not present

## 2024-02-11 DIAGNOSIS — I509 Heart failure, unspecified: Secondary | ICD-10-CM

## 2024-02-11 DIAGNOSIS — I10 Essential (primary) hypertension: Secondary | ICD-10-CM | POA: Diagnosis not present

## 2024-02-11 DIAGNOSIS — Z01810 Encounter for preprocedural cardiovascular examination: Secondary | ICD-10-CM

## 2024-02-11 DIAGNOSIS — J449 Chronic obstructive pulmonary disease, unspecified: Secondary | ICD-10-CM

## 2024-02-11 DIAGNOSIS — R000 Tachycardia, unspecified: Secondary | ICD-10-CM

## 2024-02-11 DIAGNOSIS — R600 Localized edema: Secondary | ICD-10-CM

## 2024-02-11 MED ORDER — BLOOD PRESSURE MONITOR DEVI: 1.0000 | Freq: Every day | 0 refills | Status: AC

## 2024-02-11 MED ORDER — FUROSEMIDE 20 MG PO TABS: 20.0000 mg | ORAL_TABLET | Freq: Every day | ORAL | 1 refills | Status: DC

## 2024-02-11 NOTE — Progress Notes (Signed)
 Cardiology Office Note:  .   Date:  02/11/2024 ID:  Heidi [name], DOB 05-05-1971, MRN [id number] PCP: Heidi Records, FNP  [facility] Providers Cardiologist:  Heidi Rich, MD    History of Present Illness: .   Heidi [name] is a 53 y.o. female with a PMH of shortness of breath, palpitations, tachycardia, COPD, history of tobacco abuse, who presents today for scheduled follow-up.  Last seen by Dr. Dina [name] on January 06, 2024.  She noted palpitations occurring about 1-2 times per week, only lasting a few minutes.  Admitted to some shortness of breath and dizziness.  Her shortness of breath have been ongoing for about 6 months and noted with mild activities.  She noted improvement with her shortness of breath with by taking her inhaler.  PFTs were ordered, have not been done.  Monitor revealed sinus rhythm with average heart rate 99 bpm -see full report below.  ED visit on 02/08/2024 for chest pain, shortness of breath. EKG negative for anything acute. D-dimer slightly elevated. Troponins negative. CT angio revealed no PE, evidence of emphysema, and trace pericardial effusion. CXR negative for anything acute.   Today she presents for follow-up.  She presents with shortness of breath and chest pain. Admits to orthopnea, weight gain and swelling, particularly noted in her abdomen. Weight is up 15 lbs from earlier this month. She notices feeling fatigued. Admits to intermittent CP along left upper chest, described as dull. Noted to be worse with talking, denies any alleviating factors. Notices sensation of palpitations and tachycardia when laying down. Denies any syncope, presyncope, dizziness, PND, acute bleeding, or claudication. Our office has also received clearance regarding future dental procedure - see telephone note dated 02/03/2024.  ROS: Negative. See HPI.   Studies Reviewed: Marland Kitchen    EKG: EKG Interpretation Date/Time:  Friday February 11 2024 13:21:26  EST Ventricular Rate:  112 PR Interval:  140 QRS Duration:  70 QT Interval:  344 QTC Calculation: 469 R Axis:   88  Text Interpretation: Sinus tachycardia When compared with ECG of 08-Feb-2024 09:56, Nonspecific T wave abnormality no longer evident in Lateral leads Confirmed by Sharlene Dory [phone]) on 02/11/2024 1:41:51 PM   Cardiac monitor (preliminary report) 12/2023: Patch Wear Time:  6 days and 10 hours (2025-01-16T10:53:25-0500 to 2025-01-22T21:12:17-0500)   Patient had a min HR of 58 bpm, max HR of 161 bpm, and avg HR of 99 bpm. Predominant underlying rhythm was Sinus Rhythm. Isolated SVEs were rare (<1.0%), SVE Couplets were rare (<1.0%), and no SVE Triplets were present. Isolated VEs were rare (<1.0%),  and no VE Couplets or VE Triplets were present.  Risk Assessment/Calculations:    The 10-year ASCVD risk score (Arnett DK, et al., 2019) is: 4.5%   Values used to calculate the score:     Age: 41 years     Sex: Female     Is Non-Hispanic African American: Yes     Diabetic: No     Tobacco smoker: Yes     Systolic Blood Pressure: 112 mmHg     Is BP treated: Yes     HDL Cholesterol: 56 mg/dL     Total Cholesterol: 236 mg/dL  Physical Exam:   VS:  BP 112/60 (BP Location: Left Arm, Patient Position: Sitting, Cuff Size: Normal)   Pulse (!) 114   Ht 5\' 3"  (1.6 m)   Wt 203 lb (92.1 kg)   LMP  (Approximate)   SpO2 95%   BMI 35.96  kg/m    Wt Readings from Last 3 Encounters:  02/11/24 203 lb (92.1 kg)  02/08/24 187 lb 13.3 oz (85.2 kg)  01/24/24 188 lb (85.3 kg)    GEN: Obese, 53 y.o. female in no acute distress NECK: No JVD; No carotid bruits CARDIAC: S1/S2, fast rate and regular rhythm, no murmurs, rubs, gallops RESPIRATORY:  Clear to auscultation without rales, wheezing or rhonchi  ABDOMEN: Soft, distended, swollen EXTREMITIES: no noticeable edema to extremities; No deformity   ASSESSMENT AND PLAN: .    Acute on chronic CHF, shortness of breath Stage C, NYHA class  III symptoms. Will arrange Echocardiogram for further revaluation. She has signs of volume overload on exam, not in acute distress. Will begin Lasix 20 mg daily. She will update [place] in 1 week about her weight trends. Will obtain BMET, proBNP, and Magnesium level in 1 week. Low sodium diet, fluid restriction <2L, and daily weights encouraged. Educated to contact our office for weight gain of 2 lbs overnight or 5 lbs in one week. Care and ED precautions discussed.   Chest pain of uncertain etiology Etiology unclear, but wonder if volume overload has contributed to this, stable from recent ED visit. R/O for ACS. No acute ischemic changes on EKG today. Arranging Echo as mentioned above. Working on diuresis as mentioned above and will re-evaluate her symptoms at next office visit. Will need to be without signs of volume overload prior to consider further ischemic evaluation if needed. Continue current medication regimen. Care and ED precautions discussed.   Tachycardia Recent monitor showed average HR of 99 bpm, HR today 114 bpm. Initial BP on arrival 84/60, improved to 112/60. Not in acute distress on exam. Continue current medication regimen. Hesitant to go up on medication with current BP trends. She will update [place] in 1 week regarding her BP/HR trends. Will provide a Rx for BP cuff. No PE on recent CT imaging. Heart healthy diet encouraged. Care and ED precautions discussed.   COPD Evidence of emphysema noted on recent CT imaging. Does note some shortness of breath. No evidence of COPD exacerbation noted on recent imaging. Not in acute distress. PFT's recently arranged. Working on diuresis. At next office, consider referral to Pulmonology. Continue to follow with PCP. Care and ED precautions discussed.   Pre-op clearance She is pending future teeth extraction; however pt requires diuresis at this time d/t her symptoms. Recommend addressing clearance when pt is euvolemic and well compensated on exam and  symptoms are resolved.       Dispo: Follow-up with me/APP in 4-6 weeks or sooner if anything changes.   Signed, Sharlene Dory, NP

## 2024-02-11 NOTE — Patient Instructions (Addendum)
Medication Instructions:  Your physician has recommended you make the following change in your medication:  Please start Lasix 20 Mg daily   Labwork: In 1 week at [facility] in [place]   Testing/Procedures: Your physician has requested that you have an echocardiogram. Echocardiography is a painless test that uses sound waves to create images of your heart. It provides your doctor with information about the size and shape of your heart and how well your heart's chambers and valves are working. This procedure takes approximately one hour. There are no restrictions for this procedure. Please do NOT wear cologne, perfume, aftershave, or lotions (deodorant is allowed). Please arrive 15 minutes prior to your appointment time.  Please note: We ask at that you not bring children with you during ultrasound (echo/ vascular) testing. Due to room size and safety concerns, children are not allowed in the ultrasound rooms during exams. Our front office staff cannot provide observation of children in our lobby area while testing is being conducted. An adult accompanying a patient to their appointment will only be allowed in the ultrasound room at the discretion of the ultrasound technician under special circumstances. We apologize for any inconvenience.  Follow-Up: Your physician recommends that you schedule a follow-up appointment in: 4-6 weeks   Any Other Special Instructions Will Be Listed Below (If Applicable).   If you need a refill on your cardiac medications before your next appointment, please call your pharmacy.

## 2024-02-12 ENCOUNTER — Encounter: Payer: Self-pay | Admitting: Nurse Practitioner

## 2024-02-14 ENCOUNTER — Ambulatory Visit: Payer: MEDICAID | Admitting: Student in an Organized Health Care Education/Training Program

## 2024-02-14 ENCOUNTER — Other Ambulatory Visit: Payer: Self-pay | Admitting: Orthopedic Surgery

## 2024-02-14 ENCOUNTER — Telehealth: Payer: Self-pay | Admitting: Orthopedic Surgery

## 2024-02-14 ENCOUNTER — Other Ambulatory Visit (HOSPITAL_COMMUNITY)
Admission: RE | Admit: 2024-02-14 | Discharge: 2024-02-14 | Disposition: A | Discharge status: Home / Self Care | Payer: MEDICAID | Source: Ambulatory Visit | Attending: Nurse Practitioner | Admitting: Nurse Practitioner

## 2024-02-14 DIAGNOSIS — M79642 Pain in left hand: Secondary | ICD-10-CM

## 2024-02-14 DIAGNOSIS — R0602 Shortness of breath: Secondary | ICD-10-CM | POA: Insufficient documentation

## 2024-02-14 DIAGNOSIS — I509 Heart failure, unspecified: Secondary | ICD-10-CM | POA: Diagnosis not present

## 2024-02-14 DIAGNOSIS — R0600 Dyspnea, unspecified: Secondary | ICD-10-CM | POA: Diagnosis not present

## 2024-02-14 DIAGNOSIS — M48062 Spinal stenosis, lumbar region with neurogenic claudication: Secondary | ICD-10-CM

## 2024-02-14 LAB — BASIC METABOLIC PANEL
Anion gap: 11 (ref 5–15)
BUN: 9 mg/dL (ref 6–20)
CO2: 22 mmol/L (ref 22–32)
Calcium: 9.3 mg/dL (ref 8.9–10.3)
Chloride: 106 mmol/L (ref 98–111)
Creatinine, Ser: 0.71 mg/dL (ref 0.44–1.00)
GFR, Estimated: >60 mL/min (ref >60)
Glucose, Bld: 280 mg/dL — ABNORMAL HIGH (ref 70–99)
Potassium: 3.4 mmol/L — ABNORMAL LOW (ref 3.5–5.1)
Sodium: 139 mmol/L (ref 135–145)

## 2024-02-14 LAB — BRAIN NATRIURETIC PEPTIDE: B Natriuretic Peptide: 20 pg/mL (ref 0.0–100.0)

## 2024-02-14 LAB — MAGNESIUM: Magnesium: 2.1 mg/dL (ref 1.7–2.4)

## 2024-02-14 MED ORDER — PREDNISONE 10 MG PO TABS: 10.0000 mg | ORAL_TABLET | Freq: Three times a day (TID) | ORAL | 0 refills | Status: DC

## 2024-02-14 NOTE — Telephone Encounter (Signed)
 DR.  Romeo Apple    Patient called very upset she needs to see a doctor about her back and Dr. Romeo Apple said he was going to refer her to another doctor and they are saying she has to wait till March, and she can't wait that long!!  She wants someone to call her and see if Dr. Romeo Apple can go ahead and do her surgery on her back.    Please call her back at [phone]  Pain level schedule 1 to 10 is like 12 or higher she is in a lot of pain and if Dr. Romeo Apple could call her in something for pain would be great.  Pharmacy:  [facility]

## 2024-02-14 NOTE — Telephone Encounter (Signed)
 DR. Romeo Apple  Patient called and wants to know when is her medicine going to be called in .... I advised her Dr. Romeo Apple is in clinic now and seeing patients.

## 2024-02-14 NOTE — Addendum Note (Signed)
 Addended byCaffie Damme on: 02/14/2024 09:18 AM   Modules accepted: Orders

## 2024-02-14 NOTE — Telephone Encounter (Signed)
 Dr Alto Denver does not do back surgery. I called her to advise  Ran out of Prednisone it does help She is not asking about surgery she wants injections her pain management can not inject her back She wants to know if we can refer to someone else to do the injections. And add on the meds if possible  Pended the prednisone

## 2024-02-15 NOTE — Progress Notes (Unsigned)
   Established Patient Office Visit   Subjective  Patient ID: QUINCY PRISCO, female    DOB: Oct 21, 1971  Age: 53 y.o. MRN: [id number]  No chief complaint on file.   She  has a past medical history of Alcohol withdrawal (HCC), Anxiety, Cervical radiculopathy, Depression, GERD (gastroesophageal reflux disease), and Hypertension.  HPI  ROS    Objective:     There were no vitals taken for this visit. {Vitals History (Optional):23777}  Physical Exam   No results found for any visits on 02/16/24.  The 10-year ASCVD risk score (Arnett DK, et al., 2019) is: 4.5%    Assessment & Plan:  There are no diagnoses linked to this encounter.  No follow-ups on file.   Cruzita Lederer Newman Nip, FNP

## 2024-02-15 NOTE — Patient Instructions (Signed)
        Great to see you today.  I have refilled the medication(s) we provide.    Blood pressure medication:   Your prescribed blood pressure medications are as follows:  Diltiazem (Cardizem CD) 120 mg 24-hour extended-release capsule: Take one capsule daily by mouth.  Metoprolol tartrate (Lopressor) 25 mg tablet: Take half a tablet (12.5 mg) by mouth twice daily.  Olmesartan (Benicar) 20 mg tablet once daily  Clonidine (Catapres) 0.2 mg/24-hour patch  Please bring blood pressure readings at out next visit    If labs were collected, we will inform you of lab results once received either by echart message or telephone call.   - echart message- for normal results that have been seen by the patient already.   - telephone call: abnormal results or if patient has not viewed results in their echart.   - Please take medications as prescribed. - Follow up with your primary health provider if any health concerns arises. - If symptoms worsen please contact your primary care provider and/or visit the emergency department.

## 2024-02-16 ENCOUNTER — Ambulatory Visit (INDEPENDENT_AMBULATORY_CARE_PROVIDER_SITE_OTHER): Payer: MEDICAID | Admitting: Family Medicine

## 2024-02-16 ENCOUNTER — Encounter: Payer: Self-pay | Admitting: Family Medicine

## 2024-02-16 VITALS — BP 140/80 | HR 121 | Ht 63.0 in | Wt 199.8 lb

## 2024-02-16 DIAGNOSIS — E038 Other specified hypothyroidism: Secondary | ICD-10-CM

## 2024-02-16 DIAGNOSIS — E559 Vitamin D deficiency, unspecified: Secondary | ICD-10-CM

## 2024-02-16 DIAGNOSIS — R635 Abnormal weight gain: Secondary | ICD-10-CM

## 2024-02-16 DIAGNOSIS — I10 Essential (primary) hypertension: Secondary | ICD-10-CM | POA: Diagnosis not present

## 2024-02-16 DIAGNOSIS — R7301 Impaired fasting glucose: Secondary | ICD-10-CM

## 2024-02-16 DIAGNOSIS — Z1231 Encounter for screening mammogram for malignant neoplasm of breast: Secondary | ICD-10-CM

## 2024-02-16 MED ORDER — GOJJI WEIGHT SCALE MISC
0 refills | Status: AC
Start: 2024-02-16 — End: ?

## 2024-02-16 MED ORDER — OLMESARTAN MEDOXOMIL 20 MG PO TABS: 20.0000 mg | ORAL_TABLET | Freq: Every day | ORAL | 2 refills | Status: DC

## 2024-02-16 MED ORDER — DILTIAZEM HCL ER COATED BEADS 120 MG PO CP24: 120.0000 mg | ORAL_CAPSULE | Freq: Every day | ORAL | 2 refills | Status: DC

## 2024-02-16 MED ORDER — LEVOCETIRIZINE DIHYDROCHLORIDE 5 MG PO TABS: 5.0000 mg | ORAL_TABLET | Freq: Every evening | ORAL | 3 refills | Status: DC

## 2024-02-16 MED ORDER — AMOXICILLIN 500 MG PO CAPS: 500.0000 mg | ORAL_CAPSULE | Freq: Two times a day (BID) | ORAL | 0 refills | Status: AC

## 2024-02-16 MED ORDER — METOPROLOL TARTRATE 25 MG PO TABS: 12.5000 mg | ORAL_TABLET | Freq: Two times a day (BID) | ORAL | 2 refills | Status: DC

## 2024-02-16 NOTE — Assessment & Plan Note (Addendum)
 Vitals:   02/16/24 0953 02/16/24 1027 02/16/24 1116  BP: (!) 163/83 119/78 (!) 140/80  At home blood pressure readings 160/80, 170/90, 154/89, 162/89 Medication compliance is questionable, Patient unaware of what medications to take. Advise the importance of medication compliance to prevent cardiac complications.  Patient is being followed by Cardiologist with Echo scheduled on 03/02/24  Educated patient on blood pressure medication list:   Diltiazem (Cardizem CD) 120 mg 24-hour extended-release capsule: Take one capsule daily by mouth.  Metoprolol tartrate (Lopressor) 25 mg tablet: Take half a tablet (12.5 mg) by mouth twice daily.  Olmesartan (Benicar) 20 mg tablet once daily  Clonidine (Catapres) 0.2 mg/24-hour patch  Follow up in 4 weeks  Advise to Please bring blood pressure readings and medications at our next visit

## 2024-02-17 ENCOUNTER — Encounter: Payer: Self-pay | Admitting: Family Medicine

## 2024-02-17 ENCOUNTER — Other Ambulatory Visit: Payer: Self-pay | Admitting: Family Medicine

## 2024-02-17 LAB — LIPID PANEL
Chol/HDL Ratio: 2.5 {ratio} (ref 0.0–4.4)
Cholesterol, Total: 215 mg/dL — ABNORMAL HIGH (ref 100–199)
HDL: 86 mg/dL (ref >39)
LDL Chol Calc (NIH): 110 mg/dL — ABNORMAL HIGH (ref 0–99)
Triglycerides: 108 mg/dL (ref 0–149)
VLDL Cholesterol Cal: 19 mg/dL (ref 5–40)

## 2024-02-17 LAB — HEMOGLOBIN A1C
Est. average glucose Bld gHb Est-mCnc: 163 mg/dL
Hgb A1c MFr Bld: 7.3 % — ABNORMAL HIGH (ref 4.8–5.6)

## 2024-02-17 LAB — TSH+FREE T4
Free T4: 0.63 ng/dL — ABNORMAL LOW (ref 0.82–1.77)
TSH: 0.974 u[IU]/mL (ref 0.450–4.500)

## 2024-02-17 LAB — VITAMIN D 25 HYDROXY (VIT D DEFICIENCY, FRACTURES): Vit D, 25-Hydroxy: 14.5 ng/mL — ABNORMAL LOW (ref 30.0–100.0)

## 2024-02-17 MED ORDER — METFORMIN HCL 500 MG PO TABS: 500.0000 mg | ORAL_TABLET | Freq: Two times a day (BID) | ORAL | 3 refills | Status: DC

## 2024-02-18 ENCOUNTER — Telehealth: Payer: Self-pay

## 2024-02-18 DIAGNOSIS — Z8709 Personal history of other diseases of the respiratory system: Secondary | ICD-10-CM

## 2024-02-18 NOTE — Telephone Encounter (Signed)
 She wants nebulizer machine or humidifier ?

## 2024-02-18 NOTE — Telephone Encounter (Signed)
 Spoke to pt. She is requesting a nebulizer.

## 2024-02-21 ENCOUNTER — Ambulatory Visit (HOSPITAL_COMMUNITY)
Admission: RE | Admit: 2024-02-21 | Discharge: 2024-02-21 | Disposition: A | Discharge status: Home / Self Care | Payer: MEDICAID | Source: Ambulatory Visit | Attending: Cardiology | Admitting: Cardiology

## 2024-02-21 DIAGNOSIS — R0602 Shortness of breath: Secondary | ICD-10-CM

## 2024-02-21 LAB — PULMONARY FUNCTION TEST
DL/VA % pred: 65 %
DL/VA: 2.84 ml/min/mmHg/L
DLCO unc % pred: 52 %
DLCO unc: 10.76 ml/min/mmHg
FEF 25-75 Post: 2.38 L/s
FEF 25-75 Pre: 2.09 L/s
FEF2575-%Change-Post: 14 %
FEF2575-%Pred-Post: 88 %
FEF2575-%Pred-Pre: 77 %
FEV1-%Change-Post: 2 %
FEV1-%Pred-Post: 74 %
FEV1-%Pred-Pre: 72 %
FEV1-Post: 2.02 L
FEV1-Pre: 1.97 L
FEV1FVC-%Change-Post: 4 %
FEV1FVC-%Pred-Pre: 101 %
FEV6-%Change-Post: -1 %
FEV6-%Pred-Post: 72 %
FEV6-%Pred-Pre: 72 %
FEV6-Post: 2.4 L
FEV6-Pre: 2.42 L
FEV6FVC-%Pred-Post: 102 %
FEV6FVC-%Pred-Pre: 102 %
FVC-%Change-Post: -1 %
FVC-%Pred-Post: 70 %
FVC-%Pred-Pre: 70 %
FVC-Post: 2.4 L
FVC-Pre: 2.42 L
Post FEV1/FVC ratio: 84 %
Post FEV6/FVC ratio: 100 %
Pre FEV1/FVC ratio: 81 %
Pre FEV6/FVC Ratio: 100 %

## 2024-02-21 MED ORDER — ALBUTEROL SULFATE (2.5 MG/3ML) 0.083% IN NEBU
2.5000 mg | INHALATION_SOLUTION | Freq: Once | RESPIRATORY_TRACT | Status: AC
  Administered 2024-02-21: 2.5 mg via RESPIRATORY_TRACT

## 2024-02-23 ENCOUNTER — Other Ambulatory Visit: Payer: Self-pay | Admitting: Family Medicine

## 2024-02-23 ENCOUNTER — Ambulatory Visit: Payer: MEDICAID | Admitting: Family Medicine

## 2024-02-23 MED ORDER — IBUPROFEN 800 MG PO TABS: 800.0000 mg | ORAL_TABLET | Freq: Three times a day (TID) | ORAL | 2 refills | Status: DC | PRN

## 2024-02-23 NOTE — Telephone Encounter (Unsigned)
 Copied from CRM [id number]. Topic: Clinical - Home Health Verbal Orders >> Feb 23, 2024  9:18 AM Desma Mcgregor wrote: Caller/Agency: Patient calling in Callback Number: [phone] Service Requested: Occupational Therapy and Physical Therapy Frequency: n/a Any new concerns about the patient? No. Patient says she does need a nebulizer for her trouble breathing per discussion 2/26. Mentioned a humidifier for the dry heat.

## 2024-02-23 NOTE — Telephone Encounter (Signed)
 Copied from CRM [id number]. Topic: Clinical - Medication Refill >> Feb 23, 2024  9:24 AM Desma Mcgregor wrote: Most Recent Primary Care Visit:  Provider: Rica Records  Department: [facility]  Visit Type: OFFICE VISIT  Date: 02/16/2024  Medication: ibuprofen (ADVIL) 800 MG tablet  Has the patient contacted their pharmacy? No, but patient never got this rx filled by the pcp.  Is this the correct pharmacy for this prescription? Yes If no, delete pharmacy and type the correct one.  This is the patient's preferred pharmacy:  [facility] - [place], [place] - [address] [place] [address] Phone: [phone] Fax: [phone]   Has the prescription been filled recently? Yes  Is the patient out of the medication? Yes  Has the patient been seen for an appointment in the last year OR does the patient have an upcoming appointment? Yes  Can we respond through MyChart? No  Agent: Please be advised that Rx refills may take up to 3 business days. We ask that you follow-up with your pharmacy.

## 2024-02-24 ENCOUNTER — Telehealth: Payer: Self-pay | Admitting: Family Medicine

## 2024-02-24 ENCOUNTER — Other Ambulatory Visit: Payer: Self-pay | Admitting: Family Medicine

## 2024-02-24 MED ORDER — ALBUTEROL SULFATE (2.5 MG/3ML) 0.083% IN NEBU: 2.5000 mg | INHALATION_SOLUTION | Freq: Four times a day (QID) | RESPIRATORY_TRACT | 1 refills | Status: DC | PRN

## 2024-02-24 NOTE — Telephone Encounter (Signed)
 Copied from CRM [id number]. Topic: Clinical - Medication Question >> Feb 24, 2024 11:30 AM Heidi [name] wrote: Reason for CRM: patient called stated she need provider or nurse to call [facility] to authorize the nebulizer. She also needs to see if provider will refer her for OT. Please f/u with patient.

## 2024-02-24 NOTE — Telephone Encounter (Signed)
 Is patient requesting physical therapy or Occupation therapy please confirm

## 2024-02-24 NOTE — Telephone Encounter (Signed)
 This patient phone call was mistakenly routed to [facility].  We do not see this patient at our clinic. That is why I forwarded it to you.

## 2024-02-24 NOTE — Telephone Encounter (Signed)
Sent and signed

## 2024-02-24 NOTE — Telephone Encounter (Signed)
 Copied from CRM [id number]. Topic: Clinical - Prescription Issue >> Feb 24, 2024  3:17 PM Martha Clan wrote: Reason for CRM: Patient needs a prescription for a nebulizer sent to: [facility] - [place], [place] - [address] [place], [place] [address] Phone: [phone]  Fax: [phone]  Patient can't use albuterol prescribed to her.

## 2024-02-25 NOTE — Telephone Encounter (Signed)
 Faxed

## 2024-02-28 ENCOUNTER — Other Ambulatory Visit: Payer: Self-pay | Admitting: Gastroenterology

## 2024-02-28 ENCOUNTER — Telehealth: Payer: Self-pay | Admitting: Family Medicine

## 2024-02-28 DIAGNOSIS — K5900 Constipation, unspecified: Secondary | ICD-10-CM

## 2024-02-28 MED ORDER — POLYETHYLENE GLYCOL 3350 17 GM/SCOOP PO POWD: 17.0000 g | Freq: Every day | ORAL | 3 refills | Status: AC

## 2024-02-28 NOTE — Telephone Encounter (Signed)
 Patient came by office say needs another referral sent to Occupational Therapy  [place] [address]

## 2024-02-28 NOTE — Telephone Encounter (Signed)
 CAP forms correction  Noted Copied Sleeved (gave to nurse)  When forms completed with corrections she asked to fax to [phone] and call her when ready at [phone].  Patient came by the office with her forms needs to be corrected page 3 of 3 needs to check Yes for in your clinical judgement are the health car conditions enough to meet an institutional level of care.

## 2024-03-01 ENCOUNTER — Ambulatory Visit: Payer: MEDICAID | Admitting: Student

## 2024-03-01 ENCOUNTER — Other Ambulatory Visit: Payer: Self-pay | Admitting: Family Medicine

## 2024-03-01 DIAGNOSIS — M545 Low back pain, unspecified: Secondary | ICD-10-CM

## 2024-03-01 NOTE — Telephone Encounter (Signed)
 SENT

## 2024-03-02 ENCOUNTER — Ambulatory Visit: Payer: MEDICAID | Attending: Nurse Practitioner

## 2024-03-02 DIAGNOSIS — I509 Heart failure, unspecified: Secondary | ICD-10-CM

## 2024-03-02 DIAGNOSIS — R0602 Shortness of breath: Secondary | ICD-10-CM | POA: Diagnosis not present

## 2024-03-02 LAB — ECHOCARDIOGRAM COMPLETE
AR max vel: 2.87 cm2
AV Area VTI: 2.74 cm2
AV Area mean vel: 2.65 cm2
AV Mean grad: 3 mmHg
AV Peak grad: 6.4 mmHg
Ao pk vel: 1.26 m/s
Area-P 1/2: 5.02 cm2
Calc EF: 66.2 %
MV VTI: 2.5 cm2
S' Lateral: 2.1 cm
Single Plane A2C EF: 68.1 %
Single Plane A4C EF: 64.8 %

## 2024-03-02 NOTE — Telephone Encounter (Signed)
 Pt. Has been informed and is very happy

## 2024-03-05 ENCOUNTER — Other Ambulatory Visit: Payer: Self-pay | Admitting: Orthopedic Surgery

## 2024-03-05 DIAGNOSIS — M47812 Spondylosis without myelopathy or radiculopathy, cervical region: Secondary | ICD-10-CM

## 2024-03-06 ENCOUNTER — Ambulatory Visit: Payer: MEDICAID | Admitting: Student in an Organized Health Care Education/Training Program

## 2024-03-06 ENCOUNTER — Other Ambulatory Visit: Payer: Self-pay | Admitting: Nurse Practitioner

## 2024-03-06 DIAGNOSIS — E876 Hypokalemia: Secondary | ICD-10-CM

## 2024-03-06 MED ORDER — POTASSIUM CHLORIDE ER 10 MEQ PO TBCR: 10.0000 meq | EXTENDED_RELEASE_TABLET | Freq: Every day | ORAL | 1 refills | Status: DC

## 2024-03-08 ENCOUNTER — Telehealth: Payer: Self-pay | Admitting: Family Medicine

## 2024-03-08 NOTE — Telephone Encounter (Signed)
 Copied from CRM [id number]. Topic: General - Other >> Mar 08, 2024  9:14 AM Elle L wrote: Reason for CRM: The patient states that FNP Tenna Child has filled out Disability paperwork for her for the [employer] and that part of the questionaire she needed was blank. The patient is requesting a call back at [phone] regarding this.

## 2024-03-09 NOTE — Telephone Encounter (Signed)
 Pt informed, will come by the office to drop the form off.

## 2024-03-09 NOTE — Telephone Encounter (Signed)
 Noted in chart forms were received and given to nurse

## 2024-03-09 NOTE — Telephone Encounter (Signed)
 Bring in paperwork so I can complete it

## 2024-03-09 NOTE — Telephone Encounter (Signed)
 Pt called in, states she dropped off disability paperwork, last week

## 2024-03-10 ENCOUNTER — Telehealth: Payer: Self-pay | Admitting: *Deleted

## 2024-03-10 DIAGNOSIS — R0602 Shortness of breath: Secondary | ICD-10-CM

## 2024-03-10 NOTE — Telephone Encounter (Signed)
-----   Message from Dina Rich sent at 03/09/2024  9:27 AM EDT ----- Some abnormalities on PFTs, can we refer to pulmonary please for SOB  Dominga Ferry MD

## 2024-03-10 NOTE — Telephone Encounter (Signed)
 Notified, copy to pcp.  She agrees to referral.

## 2024-03-11 ENCOUNTER — Other Ambulatory Visit: Payer: Self-pay | Admitting: Orthopedic Surgery

## 2024-03-11 DIAGNOSIS — M79641 Pain in right hand: Secondary | ICD-10-CM

## 2024-03-11 DIAGNOSIS — M79642 Pain in left hand: Secondary | ICD-10-CM

## 2024-03-14 NOTE — Telephone Encounter (Signed)
 Informed patient

## 2024-03-14 NOTE — Telephone Encounter (Signed)
Form completed and faxed with confirmation.

## 2024-03-16 ENCOUNTER — Telehealth: Payer: Self-pay | Admitting: Family Medicine

## 2024-03-16 NOTE — Telephone Encounter (Signed)
 Copied from CRM [id number]. Topic: General - Other >> Mar 16, 2024  9:07 AM Turkey B wrote: Reason for CRM: [facility], from [facility] called in requesting for Dr Lonna Cobb to contact [place] [employer] for more info needed on form that's missing that Dr filled out. This is for pt being in the cap program Their # is [phone]

## 2024-03-21 ENCOUNTER — Telehealth: Payer: Self-pay

## 2024-03-21 NOTE — Telephone Encounter (Signed)
 Copied from CRM [id number]. Topic: Clinical - Home Health Verbal Orders >> Mar 20, 2024 11:24 AM Tiffany B wrote: Caller states the patient shower chair is to big and unable to give her shower and would like an smaller shower chair.

## 2024-03-22 ENCOUNTER — Other Ambulatory Visit: Payer: Self-pay

## 2024-03-22 ENCOUNTER — Emergency Department (HOSPITAL_COMMUNITY)
Admission: EM | Admit: 2024-03-22 | Discharge: 2024-03-22 | Disposition: A | Discharge status: Home / Self Care | Payer: MEDICAID | Attending: Emergency Medicine | Admitting: Emergency Medicine

## 2024-03-22 ENCOUNTER — Ambulatory Visit: Payer: Self-pay | Admitting: *Deleted

## 2024-03-22 ENCOUNTER — Encounter (HOSPITAL_COMMUNITY): Payer: Self-pay

## 2024-03-22 DIAGNOSIS — R739 Hyperglycemia, unspecified: Secondary | ICD-10-CM | POA: Diagnosis present

## 2024-03-22 DIAGNOSIS — I10 Essential (primary) hypertension: Secondary | ICD-10-CM | POA: Diagnosis not present

## 2024-03-22 DIAGNOSIS — E1165 Type 2 diabetes mellitus with hyperglycemia: Secondary | ICD-10-CM | POA: Diagnosis not present

## 2024-03-22 DIAGNOSIS — Z7984 Long term (current) use of oral hypoglycemic drugs: Secondary | ICD-10-CM | POA: Diagnosis not present

## 2024-03-22 HISTORY — DX: Type 2 diabetes mellitus without complications: E11.9

## 2024-03-22 LAB — URINALYSIS, ROUTINE W REFLEX MICROSCOPIC
Bilirubin Urine: NEGATIVE
Glucose, UA: >=500 mg/dL — AB
Hgb urine dipstick: NEGATIVE
Ketones, ur: NEGATIVE mg/dL
Nitrite: NEGATIVE
Protein, ur: 30 mg/dL — AB
Specific Gravity, Urine: 1.03 (ref 1.005–1.030)
pH: 6 (ref 5.0–8.0)

## 2024-03-22 LAB — COMPREHENSIVE METABOLIC PANEL WITH GFR
ALT: 13 U/L (ref 0–44)
AST: 19 U/L (ref 15–41)
Albumin: 3.4 g/dL — ABNORMAL LOW (ref 3.5–5.0)
Alkaline Phosphatase: 127 U/L — ABNORMAL HIGH (ref 38–126)
Anion gap: 13 (ref 5–15)
BUN: 6 mg/dL (ref 6–20)
CO2: 21 mmol/L — ABNORMAL LOW (ref 22–32)
Calcium: 8.9 mg/dL (ref 8.9–10.3)
Chloride: 99 mmol/L (ref 98–111)
Creatinine, Ser: 0.95 mg/dL (ref 0.44–1.00)
GFR, Estimated: >60 mL/min (ref >60)
Glucose, Bld: 439 mg/dL — ABNORMAL HIGH (ref 70–99)
Potassium: 3.6 mmol/L (ref 3.5–5.1)
Sodium: 133 mmol/L — ABNORMAL LOW (ref 135–145)
Total Bilirubin: 0.6 mg/dL (ref 0.0–1.2)
Total Protein: 6.3 g/dL — ABNORMAL LOW (ref 6.5–8.1)

## 2024-03-22 LAB — CBC WITH DIFFERENTIAL/PLATELET
Abs Immature Granulocytes: 0.07 10*3/uL (ref 0.00–0.07)
Basophils Absolute: 0 10*3/uL (ref 0.0–0.1)
Basophils Relative: 0 %
Eosinophils Absolute: 0.1 10*3/uL (ref 0.0–0.5)
Eosinophils Relative: 1 %
HCT: 33.8 % — ABNORMAL LOW (ref 36.0–46.0)
Hemoglobin: 11.3 g/dL — ABNORMAL LOW (ref 12.0–15.0)
Immature Granulocytes: 1 %
Lymphocytes Relative: 26 %
Lymphs Abs: 2 10*3/uL (ref 0.7–4.0)
MCH: 27.8 pg (ref 26.0–34.0)
MCHC: 33.4 g/dL (ref 30.0–36.0)
MCV: 83.3 fL (ref 80.0–100.0)
Monocytes Absolute: 0.6 10*3/uL (ref 0.1–1.0)
Monocytes Relative: 8 %
Neutro Abs: 4.9 10*3/uL (ref 1.7–7.7)
Neutrophils Relative %: 64 %
Platelets: 208 10*3/uL (ref 150–400)
RBC: 4.06 MIL/uL (ref 3.87–5.11)
RDW: 14.5 % (ref 11.5–15.5)
WBC: 7.6 10*3/uL (ref 4.0–10.5)
nRBC: 0 % (ref 0.0–0.2)

## 2024-03-22 LAB — CBG MONITORING, ED
Glucose-Capillary: 422 mg/dL — ABNORMAL HIGH (ref 70–99)
Glucose-Capillary: 431 mg/dL — ABNORMAL HIGH (ref 70–99)
Glucose-Capillary: 148 mg/dL — ABNORMAL HIGH (ref 70–99)

## 2024-03-22 MED ORDER — BLOOD GLUCOSE TEST VI STRP: 1.0000 | ORAL_STRIP | Freq: Three times a day (TID) | 0 refills | Status: AC

## 2024-03-22 MED ORDER — LANCET DEVICE MISC: 1.0000 | Freq: Three times a day (TID) | 0 refills | Status: AC

## 2024-03-22 MED ORDER — INSULIN ASPART 100 UNIT/ML IJ SOLN
10.0000 [IU] | Freq: Once | INTRAMUSCULAR | Status: AC
  Administered 2024-03-22: 10 [IU] via SUBCUTANEOUS
  Filled 2024-03-22: qty 1

## 2024-03-22 MED ORDER — KETOROLAC TROMETHAMINE 15 MG/ML IJ SOLN
15.0000 mg | Freq: Once | INTRAMUSCULAR | Status: AC
  Administered 2024-03-22: 15 mg via INTRAVENOUS
  Filled 2024-03-22: qty 1

## 2024-03-22 MED ORDER — LANCETS MISC. MISC: 1.0000 | Freq: Three times a day (TID) | 0 refills | Status: AC

## 2024-03-22 MED ORDER — BLOOD GLUCOSE MONITORING SUPPL DEVI: 1.0000 | Freq: Three times a day (TID) | 0 refills | Status: AC

## 2024-03-22 MED ORDER — LACTATED RINGERS IV BOLUS
1000.0000 mL | Freq: Once | INTRAVENOUS | Status: AC
  Administered 2024-03-22: 1000 mL via INTRAVENOUS

## 2024-03-22 NOTE — Discharge Instructions (Addendum)
 You are seen in the ER today for evaluation of high blood sugar.  We are giving you IV fluids to get your blood sugar down.  Important to continue taking the metformin, you have refills at the pharmacy. It is important to adjust your diet.  Stop drinking soda, sweet tea, and other sweetened beverages.  Drink water or unsweetened tea.  He can occasionally have diet soda though this is still best to avoid.  Do not eat simple carbohydrates such as white pasta, white bread and white rice, sugary cereals for breakfast, or sweet desserts such as muffins, cakes, pies etc.  It is important to eat fresh fruits and vegetables, lean protein.  To these over processed foods.  I have prescribed glucometer for you to check your blood sugar.  Check it every morning when you wake up before you eat and record the numbers and take them to your PCP.   Is also important to keep track of what you are eating as you can see how this affects your blood sugar.  Increasing your activity by doing low impact exercise can also help control your blood sugar.   Also,  we have referred you to a Diabetes class here at the hospital.  They should contact you to arrange assistance for you in learning more about how to take care of your diabetes.

## 2024-03-22 NOTE — ED Provider Notes (Signed)
 [place] EMERGENCY DEPARTMENT AT [facility] Provider Note   CSN: [id number] Arrival date & time: 03/22/24  1318     History  Chief Complaint  Patient presents with   Hyperglycemia    Heidi [name] is a 53 y.o. female.  She presents to the ER for evaluation of high blood sugar today.  She states she is health department and had blood sugar of 444 and reported hemoglobin A1c greater than 14.  She states she was recently diagnosed with diabetes and has been having trouble excepting this.  She has past history of depression, GERD, hypertension, thrombocytopenia, chronic back pain.  Reports she drinks a lot of soda, eats frosted flakes for breakfast, eats a lot of processed food. Due to her chronic back pain she does very little exercise.  Denies abdominal pain, nausea or vomiting, chest pain or shortness of breath or other complaints.  Hyperglycemia      Home Medications Prior to Admission medications   Medication Sig Start Date End Date Taking? Authorizing Provider  Blood Glucose Monitoring Suppl DEVI 1 each by Does not apply route in the morning, at noon, and at bedtime. May substitute to any manufacturer covered by patient's insurance. 03/22/24  Yes Gwendolynn Merkey A, PA-C  Glucose Blood (BLOOD GLUCOSE TEST STRIPS) STRP 1 each by In Vitro route in the morning, at noon, and at bedtime. May substitute to any manufacturer covered by patient's insurance. 03/22/24 04/21/24 Yes Keerthana Vanrossum A, PA-C  Lancet Device MISC 1 each by Does not apply route in the morning, at noon, and at bedtime. May substitute to any manufacturer covered by patient's insurance. 03/22/24 04/21/24 Yes Mayur Duman A, PA-C  Lancets Misc. MISC 1 each by Does not apply route in the morning, at noon, and at bedtime. May substitute to any manufacturer covered by patient's insurance. 03/22/24 04/21/24 Yes Jaima Janney A, PA-C  albuterol (PROVENTIL) (2.5 MG/3ML) 0.083% nebulizer solution Take 3 mLs (2.5 mg total)  by nebulization every 6 (six) hours as needed for wheezing or shortness of breath. 02/24/24   Del Nigel Berthold, FNP  albuterol (VENTOLIN HFA) 108 (90 Base) MCG/ACT inhaler Inhale 2 puffs into the lungs every 6 (six) hours as needed for wheezing or shortness of breath. 01/24/24   Del Nigel Berthold, FNP  ALPRAZolam Prudy Feeler) 0.25 MG tablet Take 1 tablet (0.25 mg total) by mouth at bedtime as needed for anxiety. 10/13/23   Vickki Hearing, MD  Blood Pressure Monitor DEVI 1 each by Does not apply route daily. 02/11/24   Sharlene Dory, NP  citalopram (CELEXA) 20 MG tablet Take 20 mg by mouth daily. 08/04/23   [provider]  cloNIDine (CATAPRES - DOSED IN MG/24 HR) 0.2 mg/24hr patch APPLY 1 PATCH TO THE SKIN ONCE WEEKLY. 02/09/24   Del Nigel Berthold, FNP  cyclobenzaprine (FLEXERIL) 5 MG tablet TAKE 1 TABLET BY MOUTH (3) TIMES A DAY AS NEEDED FOR MUSCLE SPASMS. 11/05/23   Del Nigel Berthold, FNP  diazepam (VALIUM) 5 MG tablet Take 5 mg by mouth as needed. 09/09/23   [provider]  diltiazem (CARDIZEM CD) 120 MG 24 hr capsule Take 1 capsule (120 mg total) by mouth daily. 02/16/24   Del Nigel Berthold, FNP  ferrous sulfate 325 (65 FE) MG tablet Take 325 mg by mouth daily with breakfast.    [provider]  folic acid (FOLVITE) 1 MG tablet Take 1 tablet (1 mg total) by mouth daily. 07/25/23  Vassie Loll, MD  furosemide (LASIX) 20 MG tablet Take 1 tablet (20 mg total) by mouth daily. 02/11/24 05/11/24  Sharlene Dory, NP  gabapentin (NEURONTIN) 400 MG capsule TAKE ONE CAPSULE BY MOUTH THREE TIMES DAILY 03/06/24   Vickki Hearing, MD  ibuprofen (ADVIL) 800 MG tablet Take 1 tablet (800 mg total) by mouth every 8 (eight) hours as needed. 02/23/24   Del Nigel Berthold, FNP  levocetirizine (XYZAL) 5 MG tablet Take 1 tablet (5 mg total) by mouth every evening. 02/16/24   Del Nigel Berthold, FNP  lidocaine (LIDODERM) 5 % Place 1 patch onto the  skin daily. Remove & Discard patch within 12 hours or as directed by MD 01/24/24   Del Nigel Berthold, FNP  metFORMIN (GLUCOPHAGE) 500 MG tablet Take 1 tablet (500 mg total) by mouth 2 (two) times daily with a meal. 02/17/24   Del Newman Nip, Tenna Child, FNP  metoprolol tartrate (LOPRESSOR) 25 MG tablet Take 0.5 tablets (12.5 mg total) by mouth 2 (two) times daily. 02/16/24   Del Nigel Berthold, FNP  Misc. Devices (GOJJI WEIGHT SCALE) MISC Daily Weight 02/16/24   Del Newman Nip, Tenna Child, FNP  naltrexone (DEPADE) 50 MG tablet Take 1 tablet (50 mg total) by mouth daily. 01/24/24   Del Nigel Berthold, FNP  naproxen (NAPROSYN) 500 MG tablet Take 500 mg by mouth 2 (two) times daily. 02/01/24   [provider]  nicotine (NICODERM CQ - DOSED IN MG/24 HOURS) 14 mg/24hr patch 14 mg daily. 08/09/23   [provider]  olmesartan (BENICAR) 20 MG tablet Take 1 tablet (20 mg total) by mouth daily. 02/16/24   Del Newman Nip, Tenna Child, FNP  Omega-3 Fatty Acids (FISH OIL) 1000 MG CAPS Take 1 capsule (1,000 mg total) by mouth 2 (two) times daily. 01/24/24   Del Nigel Berthold, FNP  pantoprazole (PROTONIX) 40 MG tablet Take 1 tablet (40 mg total) by mouth 2 (two) times daily. 12/07/23 12/06/24  Lanelle Bal, DO  polyethylene glycol powder (GLYCOLAX/MIRALAX) 17 GM/SCOOP powder Take 17 g by mouth daily. Mix with 8 oz of water. 02/28/24   Letta Median, PA-C  potassium chloride (KLOR-CON) 10 MEQ tablet Take 1 tablet (10 mEq total) by mouth daily. 03/06/24 06/04/24  Sharlene Dory, NP  predniSONE (DELTASONE) 10 MG tablet Take 1 tablet (10 mg total) by mouth 3 (three) times daily. 03/13/24   Vickki Hearing, MD  rosuvastatin (CRESTOR) 40 MG tablet Take 1 tablet (40 mg total) by mouth daily. 01/24/24   Del Nigel Berthold, FNP  tiZANidine (ZANAFLEX) 2 MG tablet TAKE TWO TABLETS BY MOUTH TWICE DAILY AS NEEDED FOR MUSCLE SPASMS 01/14/24   Vickki Hearing, MD  traZODone (DESYREL) 150  MG tablet Take 150 mg by mouth at bedtime. 08/04/23   [provider]      Allergies    Patient has no known allergies.    Review of Systems   Review of Systems  Physical Exam Updated Vital Signs BP (!) 108/95 (BP Location: Right Arm)   Pulse 83   Temp 99.3 F (37.4 C) (Oral)   Resp 16   Ht 5\' 3"  (1.6 m)   Wt 91 kg   SpO2 99%   BMI 35.54 kg/m  Physical Exam Vitals and nursing note reviewed.  Constitutional:      General: She is not in acute distress.    Appearance: She is well-developed.  HENT:     Head: Normocephalic and  atraumatic.     Mouth/Throat:     Mouth: Mucous membranes are moist.  Eyes:     Conjunctiva/sclera: Conjunctivae normal.  Cardiovascular:     Rate and Rhythm: Normal rate and regular rhythm.     Heart sounds: No murmur heard. Pulmonary:     Effort: Pulmonary effort is normal. No respiratory distress.     Breath sounds: Normal breath sounds.  Abdominal:     Palpations: Abdomen is soft.     Tenderness: There is no abdominal tenderness.  Musculoskeletal:        General: No swelling, tenderness or deformity.     Cervical back: Neck supple.     Right lower leg: No edema.     Left lower leg: No edema.  Skin:    General: Skin is warm and dry.     Capillary Refill: Capillary refill takes less than 2 seconds.  Neurological:     General: No focal deficit present.     Mental Status: She is alert and oriented to person, place, and time.  Psychiatric:        Mood and Affect: Mood normal.     ED Results / Procedures / Treatments   Labs (all labs ordered are listed, but only abnormal results are displayed) Labs Reviewed  CBC WITH DIFFERENTIAL/PLATELET - Abnormal; Notable for the following components:      Result Value   Hemoglobin 11.3 (*)    HCT 33.8 (*)    All other components within normal limits  COMPREHENSIVE METABOLIC PANEL WITH GFR - Abnormal; Notable for the following components:   Sodium 133 (*)    CO2 21 (*)    Glucose, Bld 439  (*)    Total Protein 6.3 (*)    Albumin 3.4 (*)    Alkaline Phosphatase 127 (*)    All other components within normal limits  CBG MONITORING, ED - Abnormal; Notable for the following components:   Glucose-Capillary 422 (*)    All other components within normal limits  URINALYSIS, ROUTINE W REFLEX MICROSCOPIC    EKG None  Radiology No results found.  Procedures Procedures    Medications Ordered in ED Medications  lactated ringers bolus 1,000 mL (has no administration in time range)  ketorolac (TORADOL) 15 MG/ML injection 15 mg (has no administration in time range)    ED Course/ Medical Decision Making/ A&P                                 Medical Decision Making This patient presents to the ED for concern of blood sugar, this involves an extensive number of treatment options, and is a complaint that carries with it a high risk of complications and morbidity.  The differential diagnosis includes hyperglycemia, DKA, HHS, dehydration, UTI, other   Co morbidities that complicate the patient evaluation :   Hypertension, heart failure   Additional history obtained:  Additional history obtained from MR External records from outside source obtained and reviewed including prior notes and labs and med list   Lab Tests:  I Ordered, and personally interpreted labs.  The pertinent results include:   CBC at patient's baseline no leukocytosis; CMP shows glucose of 439 CO2 is 21, normal anion gap, UA shows 11-20 white blood cells with small leuks rare bacteria and 11-20 squamous epithelial cells.  Patient has no dysuria frequency urgency or flank pain.  Likely contaminated specimen.  No need for treatment.  Problem List / ED Course / Critical interventions / Medication management  Hyperglycemia-patient has poor diet, she does not know if she has been taking her metformin or not but thinks she has not because she normally takes away her blister pack pills and does not think  she ever filled the metformin.  She is going to pick this up and start it.  She states she was in denial about possibly having diabetes.  Blood sugar here elevated in the 400s but she is not in DKA, she is not toxic appearing, she is given IV fluids with no blood sugar improvement so given subcu insulin and we will plan to recheck.  She was instructed to follow very close with her doctor.  She requested a prescription for a glucometer so she can check her sugars.  We discussed diet and lifestyle modifications extensively.  She was given strict return precautions.  I am awaiting recheck of her blood sugar, as long as his downtrending and preferably below 300-3 50 will plan on discharge home with close outpatient follow-up.  Signed out to Burgess Amor, PA-C   Amount and/or Complexity of Data Reviewed Labs: ordered.  Risk OTC drugs. Prescription drug management.           Final Clinical Impression(s) / ED Diagnoses Final diagnoses:  Hyperglycemia    Rx / DC Orders ED Discharge Orders          Ordered    Blood Glucose Monitoring Suppl DEVI  3 times daily        03/22/24 1533    Glucose Blood (BLOOD GLUCOSE TEST STRIPS) STRP  3 times daily        03/22/24 1533    Lancet Device MISC  3 times daily        03/22/24 1533    Lancets Misc. MISC  3 times daily        03/22/24 [address] 03/22/24 1916    Durwin Glaze, MD 03/22/24 2337

## 2024-03-22 NOTE — ED Provider Notes (Signed)
 Patient with recent new diagnosis of diabetes, presenting with hyperglycemia without DKA.  Assumed care of patient at shift change from [facility], [place] pending improvement in CBG after IV fluids and insulin injection.  Her repeat glucose was 148 and she was feeling well at time of discharge.  She was interested in the diabetes teaching class here at any [facility] and outpatient referral has been made for this.   Burgess Amor, PA-C 03/22/24 2212    Durwin Glaze, MD 03/22/24 [phone]

## 2024-03-22 NOTE — ED Triage Notes (Signed)
 Pt arrived via POV from health department reporting CBG 444 and HgbA1C > 14. Pt reports no Hx of Diabetes but reports she needs prescription of Glucophage or Metformin 500mg .

## 2024-03-22 NOTE — Telephone Encounter (Signed)
 Tried calling back , unable to reach care Production designer, theatre/television/film.

## 2024-03-22 NOTE — Telephone Encounter (Signed)
 Call from [place] at [facility] health department with patient now  Chief Complaint: elevated blood glucose greater than 400  Symptoms: SOB with exertion, frequent urination and thirst. 55 lb weight gain per Tresa Endo since last HD visit . Unsure patient has been taking metformin as prescribed . Patient reports someone is helping her with medications and not sure what she takes.some weakness with standing  Frequency: patient reports sx urinary frequency and thirst going on x 1 month  Pertinent Negatives: Patient denies chest pain  no difficulty breathing but does have SOB with exertion Disposition: [x] ED /[] Urgent Care (no appt availability in office) / [] Appointment(In office/virtual)/ []  [place] [facility]/ [] Home Care/ [] Refused Recommended Disposition /[] [place] [facility]/ []  Follow-up with PCP Additional Notes:   Offered patient appt at different location due to no available appt with PCP or other providers. Recommended ED . Unsure of disposition due to transportation needs .  Not able to notify PCP due to lunch. Please advise.       Copied from CRM [id number]. Topic: Clinical - Red Word Triage >> Mar 22, 2024 12:12 PM Fredrica W wrote: Red Word that prompted transfer to Nurse Triage: Blood sugar unreadable over 400 and A1c also unreadable over 14 Reason for Disposition  Blood glucose > 400 mg/dL (04.5 mmol/L)  Answer Assessment - Initial Assessment Questions 1. BLOOD GLUCOSE: "What is your blood glucose level?"      Greater than 400 with Tresa Endo at health department  2. ONSET: "When did you check the blood glucose?"     Today  3. USUAL RANGE: "What is your glucose level usually?" (e.g., usual fasting morning value, usual evening value)     Patient does not check BS 4. KETONES: "Do you check for ketones (urine or blood test strips)?" If Yes, ask: "What does the test show now?"      na 5. TYPE 1 or 2:  "Do you know what type of diabetes you have?"  (e.g., Type 1, Type 2,  Gestational; doesn't know)      na 6. INSULIN: "Do you take insulin?" "What type of insulin(s) do you use? What is the mode of delivery? (syringe, pen; injection or pump)?"      na 7. DIABETES PILLS: "Do you take any pills for your diabetes?" If Yes, ask: "Have you missed taking any pills recently?"     Metformin patient unsure is she has been taking or not  8. OTHER SYMPTOMS: "Do you have any symptoms?" (e.g., fever, frequent urination, difficulty breathing, dizziness, weakness, vomiting)     Frequention urination  thirst, weakness at times to stand . 55 lbs weight gain SOB with exertion 9. PREGNANCY: "Is there any chance you are pregnant?" "When was your last menstrual period?"     na  Protocols used: Diabetes - High Blood Sugar-A-AH

## 2024-03-23 ENCOUNTER — Other Ambulatory Visit: Payer: Self-pay | Admitting: Family Medicine

## 2024-03-23 LAB — LAB REPORT - SCANNED: EGFR: 62

## 2024-03-23 NOTE — Telephone Encounter (Signed)
 Attempted to reach out to care manager. Was unable to reach. Care manager contact number is [phone]

## 2024-03-23 NOTE — Telephone Encounter (Signed)
 Contacted aid. Was not able to speak. Lvm w/ instructions to contact the company for an exchange for a smaller device.

## 2024-03-29 ENCOUNTER — Ambulatory Visit
Admission: RE | Admit: 2024-03-29 | Discharge: 2024-03-29 | Disposition: A | Discharge status: Home / Self Care | Payer: MEDICAID | Source: Ambulatory Visit | Attending: Student in an Organized Health Care Education/Training Program | Admitting: Student in an Organized Health Care Education/Training Program

## 2024-03-29 ENCOUNTER — Ambulatory Visit (HOSPITAL_BASED_OUTPATIENT_CLINIC_OR_DEPARTMENT_OTHER): Payer: MEDICAID | Admitting: Student in an Organized Health Care Education/Training Program

## 2024-03-29 ENCOUNTER — Other Ambulatory Visit: Payer: Self-pay | Admitting: Student in an Organized Health Care Education/Training Program

## 2024-03-29 VITALS — BP 117/81 | HR 88 | Temp 97.1°F | Resp 18 | Ht 63.0 in | Wt 182.5 lb

## 2024-03-29 DIAGNOSIS — G894 Chronic pain syndrome: Secondary | ICD-10-CM | POA: Diagnosis present

## 2024-03-29 DIAGNOSIS — M5412 Radiculopathy, cervical region: Secondary | ICD-10-CM

## 2024-03-29 DIAGNOSIS — M47816 Spondylosis without myelopathy or radiculopathy, lumbar region: Secondary | ICD-10-CM

## 2024-03-29 MED ORDER — DEXAMETHASONE SODIUM PHOSPHATE 10 MG/ML IJ SOLN
10.0000 mg | Freq: Once | INTRAMUSCULAR | Status: AC
  Administered 2024-03-29: 10 mg

## 2024-03-29 MED ORDER — FENTANYL CITRATE (PF) 100 MCG/2ML IJ SOLN
INTRAMUSCULAR | Status: AC
  Filled 2024-03-29: qty 2

## 2024-03-29 MED ORDER — SODIUM CHLORIDE (PF) 0.9 % IJ SOLN
INTRAMUSCULAR | Status: AC
  Filled 2024-03-29: qty 10

## 2024-03-29 MED ORDER — SODIUM CHLORIDE 0.9% FLUSH
1.0000 mL | Freq: Once | INTRAVENOUS | Status: AC
  Administered 2024-03-29: 1 mL

## 2024-03-29 MED ORDER — ROPIVACAINE HCL 2 MG/ML IJ SOLN: 18.0000 mL | Freq: Once | INTRAMUSCULAR | Status: DC

## 2024-03-29 MED ORDER — DEXAMETHASONE SODIUM PHOSPHATE 10 MG/ML IJ SOLN
20.0000 mg | Freq: Once | INTRAMUSCULAR | Status: AC
  Administered 2024-03-29: 20 mg

## 2024-03-29 MED ORDER — FENTANYL CITRATE (PF) 100 MCG/2ML IJ SOLN
25.0000 ug | INTRAMUSCULAR | Status: DC | PRN
  Administered 2024-03-29: 100 ug via INTRAVENOUS

## 2024-03-29 MED ORDER — ROPIVACAINE HCL 2 MG/ML IJ SOLN
1.0000 mL | Freq: Once | INTRAMUSCULAR | Status: AC
  Administered 2024-03-29: 1 mL via EPIDURAL

## 2024-03-29 MED ORDER — IOHEXOL 180 MG/ML  SOLN
10.0000 mL | Freq: Once | INTRAMUSCULAR | Status: AC
  Administered 2024-03-29: 10 mL via EPIDURAL

## 2024-03-29 MED ORDER — DEXAMETHASONE SODIUM PHOSPHATE 10 MG/ML IJ SOLN
INTRAMUSCULAR | Status: AC
  Filled 2024-03-29: qty 1

## 2024-03-29 MED ORDER — IOHEXOL 180 MG/ML  SOLN
INTRAMUSCULAR | Status: AC
  Filled 2024-03-29: qty 10

## 2024-03-29 MED ORDER — ROPIVACAINE HCL 2 MG/ML IJ SOLN
INTRAMUSCULAR | Status: AC
  Filled 2024-03-29: qty 20

## 2024-03-29 MED ORDER — DEXAMETHASONE SODIUM PHOSPHATE 10 MG/ML IJ SOLN
INTRAMUSCULAR | Status: AC
  Filled 2024-03-29: qty 2

## 2024-03-29 MED ORDER — LIDOCAINE HCL (PF) 2 % IJ SOLN
INTRAMUSCULAR | Status: AC
  Filled 2024-03-29: qty 10

## 2024-03-29 MED ORDER — MIDAZOLAM HCL 5 MG/5ML IJ SOLN
INTRAMUSCULAR | Status: AC
  Filled 2024-03-29: qty 5

## 2024-03-29 MED ORDER — LIDOCAINE HCL 2 % IJ SOLN
20.0000 mL | Freq: Once | INTRAMUSCULAR | Status: AC
  Administered 2024-03-29: 100 mg

## 2024-03-29 MED ORDER — MIDAZOLAM HCL 5 MG/5ML IJ SOLN
0.5000 mg | Freq: Once | INTRAMUSCULAR | Status: AC
  Administered 2024-03-29: 3 mg via INTRAVENOUS

## 2024-03-29 NOTE — Progress Notes (Signed)
 PROVIDER NOTE: Interpretation of information contained herein should be left to medically-trained personnel. Specific patient instructions are provided elsewhere under "Patient Instructions" section of medical record. This document was created in part using STT-dictation technology, any transcriptional errors that may result from this process are unintentional.  Patient: Heidi [name] Type: Established DOB: 1971-04-07 MRN: [id number] PCP: Rica Records, FNP  Service: Procedure DOS: 03/29/2024 Setting: Ambulatory Location: Ambulatory outpatient facility Delivery: Face-to-face Provider: Edward Jolly, MD Specialty: Interventional Pain Management Specialty designation: 09 Location: Outpatient facility Ref. Prov.: Del Newman Nip, [place]*       Interventional Therapy   Type: Lumbar Facet, Medial Branch Block(s) (w/ fluoroscopic mapping) #1  Laterality: Bilateral  Level: L3, L4, and L5 Medial Branch Level(s). Injecting these levels blocks the L3-4 and L4-5 lumbar facet joints. Imaging: Fluoroscopic guidance         Anesthesia: Local anesthesia (1-2% Lidocaine) Sedation: Moderate Sedation                       DOS: 03/29/2024 Performed by: Edward Jolly, MD  Primary Purpose: Diagnostic/Therapeutic Indications: Low back pain severe enough to impact quality of life or function. Lumbar facet arthropathy  NAS-11 Pain score:   Pre-procedure: 7 /10   Post-procedure: 0-No pain/10     Position / Prep / Materials:  Position: Prone  Prep solution: ChloraPrep (2% chlorhexidine gluconate and 70% isopropyl alcohol) Area Prepped: Posterolateral Lumbosacral Spine (Wide prep: From the lower border of the scapula down to the end of the tailbone and from flank to flank.)  Materials:  Tray: Block Needle(s):  Type: Spinal  Gauge (G): 22  Length: 3.5-in Qty: 2     H&P (Pre-op Assessment):  Heidi [name] is a 53 y.o. (year old), female patient, seen today for interventional treatment. She  has a  past surgical history that includes Flexible sigmoidoscopy (N/A, 07/13/2023); Colonoscopy with propofol (N/A, 11/30/2023); Esophagogastroduodenoscopy (egd) with propofol (N/A, 11/30/2023); biopsy (11/30/2023); polypectomy (11/30/2023); and Brain surgery. Heidi [name] has a current medication list which includes the following prescription(s): albuterol, albuterol, alprazolam, amoxicillin, blood glucose monitoring suppl, blood pressure monitor, citalopram, clonidine, clonidine, cyclobenzaprine, diazepam, diltiazem, ferrous sulfate, folic acid, furosemide, gabapentin, blood glucose test strips, ibuprofen, ibuprofen, lancet device, lancets misc., levocetirizine, lidocaine, metformin, metoprolol tartrate, gojji weight scale, naltrexone, naproxen, nicotine, olmesartan, fish oil, pantoprazole, polyethylene glycol powder, potassium chloride, prednisone, rosuvastatin, tizanidine, and trazodone, and the following Facility-Administered Medications: fentanyl and ropivacaine (pf) 2 mg/ml (0.2%). Her primarily concern today is the Back Pain (lower)  Initial Vital Signs:  Pulse/HCG Rate: 88ECG Heart Rate: 86 (nsr) Temp: (!) 97.1 F (36.2 C) Resp: 16 BP: (!) 119/105 SpO2: 100 %  BMI: Estimated body mass index is 32.33 kg/m as calculated from the following:   Height as of this encounter: 5\' 3"  (1.6 m).   Weight as of this encounter: 182 lb 8 oz (82.8 kg).  Risk Assessment: Allergies: Reviewed. She has no known allergies.  Allergy Precautions: None required Coagulopathies: Reviewed. None identified.  Blood-thinner therapy: None at this time Active Infection(s): Reviewed. None identified. Heidi [name] is afebrile  Site Confirmation: Heidi [name] was asked to confirm the procedure and laterality before marking the site Procedure checklist: Completed Consent: Before the procedure and under the influence of no sedative(s), amnesic(s), or anxiolytics, the patient was informed of the treatment options, risks and possible  complications. To fulfill our ethical and legal obligations, as recommended by the American Medical Association's Code of Ethics, I have informed  the patient of my clinical impression; the nature and purpose of the treatment or procedure; the risks, benefits, and possible complications of the intervention; the alternatives, including doing nothing; the risk(s) and benefit(s) of the alternative treatment(s) or procedure(s); and the risk(s) and benefit(s) of doing nothing. The patient was provided information about the general risks and possible complications associated with the procedure. These may include, but are not limited to: failure to achieve desired goals, infection, bleeding, organ or nerve damage, allergic reactions, paralysis, and death. In addition, the patient was informed of those risks and complications associated to Spine-related procedures, such as failure to decrease pain; infection (i.e.: Meningitis, epidural or intraspinal abscess); bleeding (i.e.: epidural hematoma, subarachnoid hemorrhage, or any other type of intraspinal or peri-dural bleeding); organ or nerve damage (i.e.: Any type of peripheral nerve, nerve root, or spinal cord injury) with subsequent damage to sensory, motor, and/or autonomic systems, resulting in permanent pain, numbness, and/or weakness of one or several areas of the body; allergic reactions; (i.e.: anaphylactic reaction); and/or death. Furthermore, the patient was informed of those risks and complications associated with the medications. These include, but are not limited to: allergic reactions (i.e.: anaphylactic or anaphylactoid reaction(s)); adrenal axis suppression; blood sugar elevation that in diabetics may result in ketoacidosis or comma; water retention that in patients with history of congestive heart failure may result in shortness of breath, pulmonary edema, and decompensation with resultant heart failure; weight gain; swelling or edema; medication-induced  neural toxicity; particulate matter embolism and blood vessel occlusion with resultant organ, and/or nervous system infarction; and/or aseptic necrosis of one or more joints. Finally, the patient was informed that Medicine is not an exact science; therefore, there is also the possibility of unforeseen or unpredictable risks and/or possible complications that may result in a catastrophic outcome. The patient indicated having understood very clearly. We have given the patient no guarantees and we have made no promises. Enough time was given to the patient to ask questions, all of which were answered to the patient's satisfaction. Ms. Clapp has indicated that she wanted to continue with the procedure. Attestation: I, the ordering provider, attest that I have discussed with the patient the benefits, risks, side-effects, alternatives, likelihood of achieving goals, and potential problems during recovery for the procedure that I have provided informed consent. Date  Time: 03/29/2024 10:06 AM  Pre-Procedure Preparation:  Monitoring: As per clinic protocol. Respiration, ETCO2, SpO2, BP, heart rate and rhythm monitor placed and checked for adequate function Safety Precautions: Patient was assessed for positional comfort and pressure points before starting the procedure. Time-out: I initiated and conducted the "Time-out" before starting the procedure, as per protocol. The patient was asked to participate by confirming the accuracy of the "Time Out" information. Verification of the correct person, site, and procedure were performed and confirmed by me, the nursing staff, and the patient. "Time-out" conducted as per Joint Commission's Universal Protocol (UP.01.01.01). Time: 1115 Start Time: 1115 hrs.  Description of Procedure:          Laterality: (see above) Targeted Levels: (see above)  Safety Precautions: Aspiration looking for blood return was conducted prior to all injections. At no point did we inject any  substances, as a needle was being advanced. Before injecting, the patient was told to immediately notify me if she was experiencing any new onset of "ringing in the ears, or metallic taste in the mouth". No attempts were made at seeking any paresthesias. Safe injection practices and needle disposal techniques used. Medications properly  checked for expiration dates. SDV (single dose vial) medications used. After the completion of the procedure, all disposable equipment used was discarded in the proper designated medical waste containers. Local Anesthesia: Protocol guidelines were followed. The patient was positioned over the fluoroscopy table. The area was prepped in the usual manner. The time-out was completed. The target area was identified using fluoroscopy. A 12-in long, straight, sterile hemostat was used with fluoroscopic guidance to locate the targets for each level blocked. Once located, the skin was marked with an approved surgical skin marker. Once all sites were marked, the skin (epidermis, dermis, and hypodermis), as well as deeper tissues (fat, connective tissue and muscle) were infiltrated with a small amount of a short-acting local anesthetic, loaded on a 10cc syringe with a 25G, 1.5-in  Needle. An appropriate amount of time was allowed for local anesthetics to take effect before proceeding to the next step. Local Anesthetic: Lidocaine 2.0% The unused portion of the local anesthetic was discarded in the proper designated containers. Technical description of process:   L3 Medial Branch Nerve Block (MBB): The target area for the L3 medial branch is at the junction of the postero-lateral aspect of the superior articular process and the superior, posterior, and medial edge of the transverse process of L4. Under fluoroscopic guidance, a Quincke needle was inserted until contact was made with os over the superior postero-lateral aspect of the pedicular shadow (target area). After negative aspiration for  blood, 2mL of the nerve block solution was injected without difficulty or complication. The needle was removed intact. L4 Medial Branch Nerve Block (MBB): The target area for the L4 medial branch is at the junction of the postero-lateral aspect of the superior articular process and the superior, posterior, and medial edge of the transverse process of L5. Under fluoroscopic guidance, a Quincke needle was inserted until contact was made with os over the superior postero-lateral aspect of the pedicular shadow (target area). After negative aspiration for blood, 2mL of the nerve block solution was injected without difficulty or complication. The needle was removed intact. L5 Medial Branch Nerve Block (MBB): The target area for the L5 medial branch is at the junction of the postero-lateral aspect of the superior articular process and the superior, posterior, and medial edge of the sacral ala. Under fluoroscopic guidance, a Quincke needle was inserted until contact was made with os over the superior postero-lateral aspect of the pedicular shadow (target area). After negative aspiration for blood, 2 mL of the nerve block solution was injected without difficulty or complication. The needle was removed intact.  Once the entire procedure was completed, the treated area was cleaned, making sure to leave some of the prepping solution back to take advantage of its long term bactericidal properties.         Illustration of the posterior view of the lumbar spine and the posterior neural structures. Laminae of L2 through S1 are labeled. DPRL5, dorsal primary ramus of L5; DPRS1, dorsal primary ramus of S1; DPR3, dorsal primary ramus of L3; FJ, facet (zygapophyseal) joint L3-L4; I, inferior articular process of L4; LB1, lateral branch of dorsal primary ramus of L1; IAB, inferior articular branches from L3 medial branch (supplies L4-L5 facet joint); IBP, intermediate branch plexus; MB3, medial branch of dorsal primary ramus  of L3; NR3, third lumbar nerve root; S, superior articular process of L5; SAB, superior articular branches from L4 (supplies L4-5 facet joint also); TP3, transverse process of L3.   Facet Joint Innervation (* possible contribution)  L1-2 T12, L1 (L2*)  Medial Branch  L2-3 L1, L2 (L3*)         "          "  L3-4 L2, L3 (L4*)         "          "  L4-5 L3, L4 (L5*)         "          "  L5-S1 L4, L5, S1          "          "    Vitals:   03/29/24 1125 03/29/24 1131 03/29/24 1141 03/29/24 1151  BP: 95/70 104/71 125/85 119/82  Pulse:      Resp: 18 17 18 16   Temp:      SpO2: 99% 100% 99% 97%  Weight:      Height:         End Time: 1130 hrs.  Imaging Guidance (Spinal):          Type of Imaging Technique: Fluoroscopy Guidance (Spinal) Indication(s): Fluoroscopy guidance for needle placement to enhance accuracy in procedures requiring precise needle localization for targeted delivery of medication in or near specific anatomical locations not easily accessible without such real-time imaging assistance. Exposure Time: Please see nurses notes. Contrast: None used. Fluoroscopic Guidance: I was personally present during the use of fluoroscopy. "Tunnel Vision Technique" used to obtain the best possible view of the target area. Parallax error corrected before commencing the procedure. "Direction-depth-direction" technique used to introduce the needle under continuous pulsed fluoroscopy. Once target was reached, antero-posterior, oblique, and lateral fluoroscopic projection used confirm needle placement in all planes. Images permanently stored in EMR. Interpretation: No contrast injected. I personally interpreted the imaging intraoperatively. Adequate needle placement confirmed in multiple planes. Permanent images saved into the patient's record.  Post-operative Assessment:  Post-procedure Vital Signs:  Pulse/HCG Rate: 8876 Temp: (!) 97.1 F (36.2 C) Resp: 16 BP: 119/82 SpO2: 97 %  EBL:  None  Complications: No immediate post-treatment complications observed by team, or reported by patient.  Note: The patient tolerated the entire procedure well. A repeat set of vitals were taken after the procedure and the patient was kept under observation following institutional policy, for this type of procedure. Post-procedural neurological assessment was performed, showing return to baseline, prior to discharge. The patient was provided with post-procedure discharge instructions, including a section on how to identify potential problems. Should any problems arise concerning this procedure, the patient was given instructions to immediately contact us, at any time, without hesitation. In any case, we plan to contact the patient by telephone for a follow-up status report regarding this interventional procedure.  Comments:  No additional relevant information.  Plan of Care (POC)  Orders:  No orders of the defined types were placed in this encounter.    Medications ordered for procedure: Meds ordered this encounter  Medications   iohexol (OMNIPAQUE) 180 MG/ML injection 10 mL    Must be Myelogram-compatible. If not available, you may substitute with a water-soluble, non-ionic, hypoallergenic, myelogram-compatible radiological contrast medium.   lidocaine (XYLOCAINE) 2 % (with pres) injection 400 mg   midazolam (VERSED) 5 MG/5ML injection 0.5-2 mg    Make sure Flumazenil is available in the pyxis when using this medication. If oversedation occurs, administer 0.2 mg IV over 15 sec. If after 45 sec no response, administer 0.2 mg again over 1 min; may repeat at 1 min intervals; not to  exceed 4 doses (1 mg)   fentaNYL (SUBLIMAZE) injection 25-50 mcg    Make sure Narcan is available in the pyxis when using this medication. In the event of respiratory depression (RR< 8/min): Titrate NARCAN (naloxone) in increments of 0.1 to 0.2 mg IV at 2-3 minute intervals, until desired degree of reversal.    ropivacaine (PF) 2 mg/mL (0.2%) (NAROPIN) injection 1 mL   sodium chloride flush (NS) 0.9 % injection 1 mL   dexamethasone (DECADRON) injection 10 mg   ropivacaine (PF) 2 mg/mL (0.2%) (NAROPIN) injection 18 mL   dexamethasone (DECADRON) injection 20 mg   Medications administered: We administered iohexol, lidocaine, midazolam, fentaNYL, ropivacaine (PF) 2 mg/mL (0.2%), sodium chloride flush, dexamethasone, and dexamethasone.  See the medical record for exact dosing, route, and time of administration.  Follow-up plan:   Return in about 6 weeks (around 05/10/2024) for PPE F2F.       Recent Visits No visits were found meeting these conditions. Showing recent visits within past 90 days and meeting all other requirements Today's Visits Date Type Provider Dept  03/29/24 Procedure visit Edward Jolly, MD [facility]  Showing today's visits and meeting all other requirements Future Appointments Date Type Provider Dept  05/10/24 Appointment Edward Jolly, MD [facility]  Showing future appointments within next 90 days and meeting all other requirements  Disposition: Discharge home  Discharge (Date  Time): 03/29/2024; 1205 hrs.   Primary Care Physician: Rica Records, FNP Location: [facility] Note by: Edward Jolly, MD (TTS technology used. I apologize for any typographical errors that were not detected and corrected.) Date: 03/29/2024; Time: 12:01 PM  Disclaimer:  Medicine is not an Visual merchandiser. The only guarantee in medicine is that nothing is guaranteed. It is important to note that the decision to proceed with this intervention was based on the information collected from the patient. The Data and conclusions were drawn from the patient's questionnaire, the interview, and the physical examination. Because the information was provided in large part by the patient, it cannot be guaranteed that it has not been purposely or  unconsciously manipulated. Every effort has been made to obtain as much relevant data as possible for this evaluation. It is important to note that the conclusions that lead to this procedure are derived in large part from the available data. Always take into account that the treatment will also be dependent on availability of resources and existing treatment guidelines, considered by other Pain Management Practitioners as being common knowledge and practice, at the time of the intervention. For Medico-Legal purposes, it is also important to point out that variation in procedural techniques and pharmacological choices are the acceptable norm. The indications, contraindications, technique, and results of the above procedure should only be interpreted and judged by a Board-Certified Interventional Pain Specialist with extensive familiarity and expertise in the same exact procedure and technique.

## 2024-03-29 NOTE — Progress Notes (Signed)
 PROVIDER NOTE: Interpretation of information contained herein should be left to medically-trained personnel. Specific patient instructions are provided elsewhere under "Patient Instructions" section of medical record. This document was created in part using STT-dictation technology, any transcriptional errors that may result from this process are unintentional.  Patient: Heidi [name] Type: Established DOB: December 03, 1971 MRN: [id number] PCP: Rica Records, FNP  Service: Procedure DOS: 03/29/2024 Setting: Ambulatory Location: Ambulatory outpatient facility Delivery: Face-to-face Provider: Edward Jolly, MD Specialty: Interventional Pain Management Specialty designation: 09 Location: Outpatient facility Ref. Prov.: Del Newman Nip, [place]*       Interventional Therapy   Procedure: Cervical Epidural Steroid injection (CESI) (Interlaminar) #1  Laterality: Midline  Level: C7-T1 Imaging: Fluoroscopy-assisted DOS: 03/29/2024  Performed by: Edward Jolly, MD Anesthesia: Local anesthesia (1-2% Lidocaine) Sedation: Moderate Sedation                         Purpose: Diagnostic/Therapeutic Indications: Cervicalgia, cervical radicular pain, degenerative disc disease, severe enough to impact quality of life or function.  NAS-11 score:   Pre-procedure: 7 /10   Post-procedure: 0-No pain/10      Position  Prep  Materials:  Location setting: Procedure suite Position: Prone, on modified reverse trendelenburg to facilitate breathing, with head in head-cradle. Pillows positioned under chest (below chin-level) with cervical spine flexed. Safety Precautions: Patient was assessed for positional comfort and pressure points before starting the procedure. Prepping solution: DuraPrep (Iodine Povacrylex [0.7% available iodine] and Isopropyl Alcohol, 74% w/w) Prep Area: Entire  cervicothoracic region Approach: percutaneous, paramedial Intended target: Posterior cervical epidural space Materials  Procedure:  Tray: Epidural Needle(s): Epidural (Tuohy) Qty: 1 Length: (90mm) 3.5-inch Gauge: 22G  H&P (Pre-op Assessment):  [name] is a 53 y.o. (year old), female patient, seen today for interventional treatment. She  has a past surgical history that includes Flexible sigmoidoscopy (N/A, 07/13/2023); Colonoscopy with propofol (N/A, 11/30/2023); Esophagogastroduodenoscopy (egd) with propofol (N/A, 11/30/2023); biopsy (11/30/2023); polypectomy (11/30/2023); and Brain surgery. Ms. Paulette has a current medication list which includes the following prescription(s): albuterol, albuterol, alprazolam, amoxicillin, blood glucose monitoring suppl, blood pressure monitor, citalopram, clonidine, clonidine, cyclobenzaprine, diazepam, diltiazem, ferrous sulfate, folic acid, furosemide, gabapentin, blood glucose test strips, ibuprofen, ibuprofen, lancet device, lancets misc., levocetirizine, lidocaine, metformin, metoprolol tartrate, gojji weight scale, naltrexone, naproxen, nicotine, olmesartan, fish oil, pantoprazole, polyethylene glycol powder, potassium chloride, prednisone, rosuvastatin, tizanidine, and trazodone, and the following Facility-Administered Medications: fentanyl and ropivacaine (pf) 2 mg/ml (0.2%). Her primarily concern today is the Back Pain (lower)  Initial Vital Signs:  Pulse/HCG Rate: 88ECG Heart Rate: 86 (nsr) Temp: (!) 97.1 F (36.2 C) Resp: 16 BP: (!) 119/105 SpO2: 100 %  BMI: Estimated body mass index is 32.33 kg/m as calculated from the following:   Height as of this encounter: 5\' 3"  (1.6 m).   Weight as of this encounter: 182 lb 8 oz (82.8 kg).  Risk Assessment: Allergies: Reviewed. She has no known allergies.  Allergy Precautions: None required Coagulopathies: Reviewed. None identified.  Blood-thinner therapy: None at this time Active Infection(s): Reviewed. None identified. Ms. Brienza is afebrile  Site Confirmation: Ms. Mcwhirter was asked to confirm the procedure and laterality  before marking the site Procedure checklist: Completed Consent: Before the procedure and under the influence of no sedative(s), amnesic(s), or anxiolytics, the patient was informed of the treatment options, risks and possible complications. To fulfill our ethical and legal obligations, as recommended by the American Medical Association's Code of Ethics, I have informed the  patient of my clinical impression; the nature and purpose of the treatment or procedure; the risks, benefits, and possible complications of the intervention; the alternatives, including doing nothing; the risk(s) and benefit(s) of the alternative treatment(s) or procedure(s); and the risk(s) and benefit(s) of doing nothing. The patient was provided information about the general risks and possible complications associated with the procedure. These may include, but are not limited to: failure to achieve desired goals, infection, bleeding, organ or nerve damage, allergic reactions, paralysis, and death. In addition, the patient was informed of those risks and complications associated to Spine-related procedures, such as failure to decrease pain; infection (i.e.: Meningitis, epidural or intraspinal abscess); bleeding (i.e.: epidural hematoma, subarachnoid hemorrhage, or any other type of intraspinal or peri-dural bleeding); organ or nerve damage (i.e.: Any type of peripheral nerve, nerve root, or spinal cord injury) with subsequent damage to sensory, motor, and/or autonomic systems, resulting in permanent pain, numbness, and/or weakness of one or several areas of the body; allergic reactions; (i.e.: anaphylactic reaction); and/or death. Furthermore, the patient was informed of those risks and complications associated with the medications. These include, but are not limited to: allergic reactions (i.e.: anaphylactic or anaphylactoid reaction(s)); adrenal axis suppression; blood sugar elevation that in diabetics may result in ketoacidosis or comma;  water retention that in patients with history of congestive heart failure may result in shortness of breath, pulmonary edema, and decompensation with resultant heart failure; weight gain; swelling or edema; medication-induced neural toxicity; particulate matter embolism and blood vessel occlusion with resultant organ, and/or nervous system infarction; and/or aseptic necrosis of one or more joints. Finally, the patient was informed that Medicine is not an exact science; therefore, there is also the possibility of unforeseen or unpredictable risks and/or possible complications that may result in a catastrophic outcome. The patient indicated having understood very clearly. We have given the patient no guarantees and we have made no promises. Enough time was given to the patient to ask questions, all of which were answered to the patient's satisfaction. Ms. Morgano has indicated that she wanted to continue with the procedure. Attestation: I, the ordering provider, attest that I have discussed with the patient the benefits, risks, side-effects, alternatives, likelihood of achieving goals, and potential problems during recovery for the procedure that I have provided informed consent. Date  Time: 03/29/2024 10:06 AM  Pre-Procedure Preparation:  Monitoring: As per clinic protocol. Respiration, ETCO2, SpO2, BP, heart rate and rhythm monitor placed and checked for adequate function Safety Precautions: Patient was assessed for positional comfort and pressure points before starting the procedure. Time-out: I initiated and conducted the "Time-out" before starting the procedure, as per protocol. The patient was asked to participate by confirming the accuracy of the "Time Out" information. Verification of the correct person, site, and procedure were performed and confirmed by me, the nursing staff, and the patient. "Time-out" conducted as per Joint Commission's Universal Protocol (UP.01.01.01). Time: 1115 Start Time: 1115  hrs.  Description  Narrative of Procedure:          Rationale (medical necessity): procedure needed and proper for the diagnosis and/or treatment of the patient's medical symptoms and needs. Start Time: 1115 hrs. Safety Precautions: Aspiration looking for blood return was conducted prior to all injections. At no point did we inject any substances, as a needle was being advanced. No attempts were made at seeking any paresthesias. Safe injection practices and needle disposal techniques used. Medications properly checked for expiration dates. SDV (single dose vial) medications used. Description of  procedure: Protocol guidelines were followed. The patient was assisted into a comfortable position. The target area was identified and the area prepped in the usual manner. Skin & deeper tissues infiltrated with local anesthetic. Appropriate amount of time allowed to pass for local anesthetics to take effect. Using fluoroscopic guidance, the epidural needle was introduced through the skin, ipsilateral to the reported pain, and advanced to the target area. Posterior laminar os was contacted and the needle walked caudad, until the lamina was cleared. The ligamentum flavum was engaged and the epidural space identified using "loss-of-resistance technique" with 2-3 ml of PF-NaCl (0.9% NSS), in a 5cc dedicated LOR syringe. (See "Imaging guidance" below for use of contrast details.) Once proper needle placement was secured, and negative aspiration confirmed, the solution was injected in intermittent fashion, asking for systemic symptoms every 0.5cc. The needles were then removed and the area cleansed, making sure to leave some of the prepping solution back to take advantage of its long term bactericidal properties.  3cc solution made of 1 cc of preservative-free saline, 1 cc of 0.2% ropivacaine, 1 cc of Decadron 10 mg/cc.   Vitals:   03/29/24 1125 03/29/24 1131 03/29/24 1141 03/29/24 1151  BP: 95/70 104/71 125/85  119/82  Pulse:      Resp: 18 17 18 16   Temp:      SpO2: 99% 100% 99% 97%  Weight:      Height:         End Time: 1130 hrs.  Imaging Guidance (Spinal):          Type of Imaging Technique: Fluoroscopy Guidance (Spinal) Indication(s): Fluoroscopy guidance for needle placement to enhance accuracy in procedures requiring precise needle localization for targeted delivery of medication in or near specific anatomical locations not easily accessible without such real-time imaging assistance. Exposure Time: Please see nurses notes. Contrast: Before injecting any contrast, we confirmed that the patient did not have an allergy to iodine, shellfish, or radiological contrast. Once satisfactory needle placement was completed at the desired level, radiological contrast was injected. Contrast injected under live fluoroscopy. No contrast complications. See chart for type and volume of contrast used. Fluoroscopic Guidance: I was personally present during the use of fluoroscopy. "Tunnel Vision Technique" used to obtain the best possible view of the target area. Parallax error corrected before commencing the procedure. "Direction-depth-direction" technique used to introduce the needle under continuous pulsed fluoroscopy. Once target was reached, antero-posterior, oblique, and lateral fluoroscopic projection used confirm needle placement in all planes. Images permanently stored in EMR. Interpretation: I personally interpreted the imaging intraoperatively. Adequate needle placement confirmed in multiple planes. Appropriate spread of contrast into desired area was observed. No evidence of afferent or efferent intravascular uptake. No intrathecal or subarachnoid spread observed. Permanent images saved into the patient's record.  Post-operative Assessment:  Post-procedure Vital Signs:  Pulse/HCG Rate: 8876 Temp: (!) 97.1 F (36.2 C) Resp: 16 BP: 119/82 SpO2: 97 %  EBL: None  Complications: No immediate  post-treatment complications observed by team, or reported by patient.  Note: The patient tolerated the entire procedure well. A repeat set of vitals were taken after the procedure and the patient was kept under observation following institutional policy, for this type of procedure. Post-procedural neurological assessment was performed, showing return to baseline, prior to discharge. The patient was provided with post-procedure discharge instructions, including a section on how to identify potential problems. Should any problems arise concerning this procedure, the patient was given instructions to immediately contact us, at any time, without  hesitation. In any case, we plan to contact the patient by telephone for a follow-up status report regarding this interventional procedure.  Comments:  No additional relevant information.  Plan of Care (POC)  Orders:  No orders of the defined types were placed in this encounter.    Medications ordered for procedure: Meds ordered this encounter  Medications   iohexol (OMNIPAQUE) 180 MG/ML injection 10 mL    Must be Myelogram-compatible. If not available, you may substitute with a water-soluble, non-ionic, hypoallergenic, myelogram-compatible radiological contrast medium.   lidocaine (XYLOCAINE) 2 % (with pres) injection 400 mg   midazolam (VERSED) 5 MG/5ML injection 0.5-2 mg    Make sure Flumazenil is available in the pyxis when using this medication. If oversedation occurs, administer 0.2 mg IV over 15 sec. If after 45 sec no response, administer 0.2 mg again over 1 min; may repeat at 1 min intervals; not to exceed 4 doses (1 mg)   fentaNYL (SUBLIMAZE) injection 25-50 mcg    Make sure Narcan is available in the pyxis when using this medication. In the event of respiratory depression (RR< 8/min): Titrate NARCAN (naloxone) in increments of 0.1 to 0.2 mg IV at 2-3 minute intervals, until desired degree of reversal.   ropivacaine (PF) 2 mg/mL (0.2%) (NAROPIN)  injection 1 mL   sodium chloride flush (NS) 0.9 % injection 1 mL   dexamethasone (DECADRON) injection 10 mg   ropivacaine (PF) 2 mg/mL (0.2%) (NAROPIN) injection 18 mL   dexamethasone (DECADRON) injection 20 mg   Medications administered: We administered iohexol, lidocaine, midazolam, fentaNYL, ropivacaine (PF) 2 mg/mL (0.2%), sodium chloride flush, dexamethasone, and dexamethasone.  See the medical record for exact dosing, route, and time of administration.  Follow-up plan:   Return in about 6 weeks (around 05/10/2024) for PPE F2F.       C-ESI, B/L lumbar facets L3-5 03/29/24    Recent Visits No visits were found meeting these conditions. Showing recent visits within past 90 days and meeting all other requirements Today's Visits Date Type Provider Dept  03/29/24 Procedure visit Edward Jolly, MD [facility]  Showing today's visits and meeting all other requirements Future Appointments Date Type Provider Dept  05/10/24 Appointment Edward Jolly, MD [facility]  Showing future appointments within next 90 days and meeting all other requirements  Disposition: Discharge home  Discharge (Date  Time): 03/29/2024; 1205 hrs.   Primary Care Physician: Rica Records, FNP Location: [facility] Note by: Edward Jolly, MD (TTS technology used. I apologize for any typographical errors that were not detected and corrected.) Date: 03/29/2024; Time: 11:59 AM  Disclaimer:  Medicine is not an Visual merchandiser. The only guarantee in medicine is that nothing is guaranteed. It is important to note that the decision to proceed with this intervention was based on the information collected from the patient. The Data and conclusions were drawn from the patient's questionnaire, the interview, and the physical examination. Because the information was provided in large part by the patient, it cannot be guaranteed that it has not been purposely or  unconsciously manipulated. Every effort has been made to obtain as much relevant data as possible for this evaluation. It is important to note that the conclusions that lead to this procedure are derived in large part from the available data. Always take into account that the treatment will also be dependent on availability of resources and existing treatment guidelines, considered by other Pain Management Practitioners as being common knowledge and  practice, at the time of the intervention. For Medico-Legal purposes, it is also important to point out that variation in procedural techniques and pharmacological choices are the acceptable norm. The indications, contraindications, technique, and results of the above procedure should only be interpreted and judged by a Board-Certified Interventional Pain Specialist with extensive familiarity and expertise in the same exact procedure and technique.

## 2024-03-29 NOTE — Progress Notes (Signed)
 Safety precautions to be maintained throughout the outpatient stay will include: orient to surroundings, keep bed in low position, maintain call bell within reach at all times, provide assistance with transfer out of bed and ambulation.

## 2024-03-29 NOTE — Patient Instructions (Signed)
 Post-Procedure Discharge Instructions  Instructions: Apply ice:  Purpose: This will minimize any swelling and discomfort after procedure.  When: Day of procedure, as soon as you get home. How: Fill a plastic sandwich bag with crushed ice. Cover it with a small towel and apply to injection site. How long: (15 min on, 15 min off) Apply for 15 minutes then remove x 15 minutes.  Repeat sequence on day of procedure, until you go to bed. Apply heat:  Purpose: To treat any soreness and discomfort from the procedure. When: Starting the next day after the procedure. How: Apply heat to procedure site starting the day following the procedure. How long: May continue to repeat daily, until discomfort goes away. Food intake: Start with clear liquids (like water) and advance to regular food, as tolerated.  Physical activities: Keep activities to a minimum for the first 8 hours after the procedure. After that, then as tolerated. Driving: If you have received any sedation, be responsible and do not drive. You are not allowed to drive for 24 hours after having sedation. Blood thinner: (Applies only to those taking blood thinners) You may restart your blood thinner 6 hours after your procedure. Insulin: (Applies only to Diabetic patients taking insulin) As soon as you can eat, you may resume your normal dosing schedule. Infection prevention: Keep procedure site clean and dry. Shower daily and clean area with soap and water. Post-procedure Pain Diary: Extremely important that this be done correctly and accurately. Recorded information will be used to determine the next step in treatment. For the purpose of accuracy, follow these rules: Evaluate only the area treated. Do not report or include pain from an untreated area. For the purpose of this evaluation, ignore all other areas of pain, except for the treated area. After your procedure, avoid taking a long nap and attempting to complete the pain diary after you  wake up. Instead, set your alarm clock to go off every hour, on the hour, for the initial 8 hours after the procedure. Document the duration of the numbing medicine, and the relief you are getting from it. Do not go to sleep and attempt to complete it later. It will not be accurate. If you received sedation, it is likely that you were given a medication that may cause amnesia. Because of this, completing the diary at a later time may cause the information to be inaccurate. This information is needed to plan your care. Follow-up appointment: Keep your post-procedure follow-up evaluation appointment after the procedure (usually 2 weeks for most procedures, 6 weeks for radiofrequencies). DO NOT FORGET to bring you pain diary with you.   Expect: (What should I expect to see with my procedure?) From numbing medicine (AKA: Local Anesthetics): Numbness or decrease in pain. You may also experience some weakness, which if present, could last for the duration of the local anesthetic. Onset: Full effect within 15 minutes of injected. Duration: It will depend on the type of local anesthetic used. On the average, 1 to 8 hours.  From steroids (Applies only if steroids were used): Decrease in swelling or inflammation. Once inflammation is improved, relief of the pain will follow. Onset of benefits: Depends on the amount of swelling present. The more swelling, the longer it will take for the benefits to be seen. In some cases, up to 10 days. Duration: Steroids will stay in the system x 2 weeks. Duration of benefits will depend on multiple posibilities including persistent irritating factors. Side-effects: If present, they may typically  last 2 weeks (the duration of the steroids). Frequent: Cramps (if they occur, drink Gatorade and take over-the-counter Magnesium 450-500 mg once to twice a day); water retention with temporary weight gain; increases in blood sugar; decreased immune system response; increased  appetite. Occasional: Facial flushing (red, warm cheeks); mood swings; menstrual changes. Uncommon: Long-term decrease or suppression of natural hormones; bone thinning. (These are more common with higher doses or more frequent use. This is why we prefer that our patients avoid having any injection therapies in other practices.)  Very Rare: Severe mood changes; psychosis; aseptic necrosis. From procedure: Some discomfort is to be expected once the numbing medicine wears off. This should be minimal if ice and heat are applied as instructed.  Call if: (When should I call?) You experience numbness and weakness that gets worse with time, as opposed to wearing off. New onset bowel or bladder incontinence. (Applies only to procedures done in the spine)  Emergency Numbers: Durning business hours (Monday - Thursday, 8:00 AM - 4:00 PM) (Friday, 9:00 AM - 12:00 Noon): [phone] After hours: [phone] NOTE: If you are having a problem and are unable connect with, or to talk to a provider, then go to your nearest urgent care or emergency department. If the problem is serious and urgent, please call 911.  Moderate Conscious Sedation, Adult, Care After After the procedure, it is common to have: Sleepiness for a few hours. Impaired judgment for a few hours. Trouble with balance. Nausea or vomiting if you eat too soon. Follow these instructions at home: For the time period you were told by your health care provider:  Rest. Do not participate in activities where you could fall or become injured. Do not drive or use machinery. Do not drink alcohol. Do not take sleeping pills or medicines that cause drowsiness. Do not make important decisions or sign legal documents. Do not take care of children on your own. Eating and drinking Follow instructions from your health care provider about what you may eat and drink. Drink enough fluid to keep your urine pale yellow. If you vomit: Drink clear  fluids slowly and in small amounts as you are able. Clear fluids include water, ice chips, low-calorie sports drinks, and fruit juice that has water added to it (diluted fruit juice). Eat light and bland foods in small amounts as you are able. These foods include bananas, applesauce, rice, lean meats, toast, and crackers. General instructions Take over-the-counter and prescription medicines only as told by your health care provider. Have a responsible adult stay with you for the time you are told. Do not use any products that contain nicotine or tobacco. These products include cigarettes, chewing tobacco, and vaping devices, such as e-cigarettes. If you need help quitting, ask your health care provider. Return to your normal activities as told by your health care provider. Ask your health care provider what activities are safe for you. Your health care provider may give you more instructions. Make sure you know what you can and cannot do.   Contact a health care provider if: You are still sleepy or having trouble with balance after 24 hours. You feel light-headed. You vomit every time you eat or drink. You get a rash. You have a fever. You have redness or swelling around the IV site. Get help right away if: You have trouble breathing. You start to feel confused at home. These symptoms may be an emergency. Get help right away. Call 911. Do not wait to see  if the symptoms will go away. Do not drive yourself to the hospital. This information is not intended to replace advice given to you by your health care provider. Make sure you discuss any questions you have with your health care provider. Document Revised: 06/22/2022 Document Reviewed: 06/22/2022 Elsevier Patient Education  2024 [employer].

## 2024-03-30 ENCOUNTER — Telehealth: Payer: Self-pay

## 2024-03-30 NOTE — Telephone Encounter (Signed)
 Attempt to contact for post-procedure f/u. No answer and LVM.

## 2024-03-31 ENCOUNTER — Encounter (HOSPITAL_COMMUNITY): Payer: Self-pay

## 2024-03-31 ENCOUNTER — Ambulatory Visit (HOSPITAL_COMMUNITY): Payer: MEDICAID | Attending: Family Medicine

## 2024-03-31 ENCOUNTER — Other Ambulatory Visit: Payer: Self-pay

## 2024-03-31 DIAGNOSIS — M545 Low back pain, unspecified: Secondary | ICD-10-CM | POA: Diagnosis present

## 2024-03-31 DIAGNOSIS — M6281 Muscle weakness (generalized): Secondary | ICD-10-CM | POA: Diagnosis present

## 2024-03-31 DIAGNOSIS — G8929 Other chronic pain: Secondary | ICD-10-CM | POA: Diagnosis present

## 2024-03-31 NOTE — Therapy (Signed)
 OUTPATIENT PHYSICAL THERAPY THORACOLUMBAR EVALUATION   Patient Name: Heidi [name] MRN: [id number] DOB:Oct 19, 1971, 53 y.o., female Today's Date: 03/31/2024  END OF SESSION:  PT End of Session - 03/31/24 1135     Visit Number 1    Date for PT Re-Evaluation 05/26/24    Authorization Type [employer]    Authorization Time Period seeking new auth    Progress Note Due on Visit 8    PT Start Time 1103    PT Stop Time 1135    PT Time Calculation (min) 32 min    Behavior During Therapy [facility] for tasks assessed/performed             Past Medical History:  Diagnosis Date   Alcohol withdrawal (HCC)    Anxiety    Cervical radiculopathy    Depression    Diabetes mellitus without complication (HCC)    GERD (gastroesophageal reflux disease)    Hypertension    Past Surgical History:  Procedure Laterality Date   BIOPSY  11/30/2023   Procedure: BIOPSY;  Surgeon: Lanelle Bal, DO;  [facility];  Service: Endoscopy;;   BRAIN SURGERY     COLONOSCOPY WITH PROPOFOL N/A 11/30/2023   Surgeon: Earnest Bailey K, DO;   5 mm polyp resected and retrieved, stool throughout the entire colon.  Pathology with tubular adenoma.  Recommended 5-year surveillance.   ESOPHAGOGASTRODUODENOSCOPY (EGD) WITH PROPOFOL N/A 11/30/2023   Surgeon: Lanelle Bal, DO;  Small hiatal hernia, gastritis biopsied, duodenitis.  Pathology with reactive gastropathy and focal erosion, negative for H. pylori. Recommended PPI BID.   FLEXIBLE SIGMOIDOSCOPY N/A 07/13/2023   Procedure: FLEXIBLE SIGMOIDOSCOPY;  Surgeon: Dolores Frame, MD;  [facility]: [facility];  Service: Gastroenterology;  Laterality: N/A;   POLYPECTOMY  11/30/2023   Procedure: POLYPECTOMY;  Surgeon: Lanelle Bal, DO;  [facility];  Service: Endoscopy;;   Patient Active Problem List   Diagnosis Date Noted   Chest pain 02/08/2024   Lumbar facet arthropathy 12/28/2023   Chronic pain syndrome 12/28/2023    Cervical cancer screening 10/26/2023   Degenerative disc disease, lumbar 08/11/2023   Hospital discharge follow-up 07/29/2023   Laceration of spleen 07/24/2023   Ileus (HCC) 07/13/2023   Elevated LFTs 07/12/2023   Colonic obstruction (HCC) 07/12/2023   Hypothermia 07/09/2023   Hypokalemia 07/06/2023   Thrombocytopenia (HCC) 07/06/2023   Chronic back pain 05/18/2023   Bilateral knee pain 04/26/2023   Depression, recurrent (HCC) 04/26/2023   Alcohol use disorder 04/20/2023   Carpal tunnel syndrome of right wrist 04/20/2023   Tachycardia 04/20/2023   Transaminitis 04/06/2023   GERD (gastroesophageal reflux disease) 04/06/2023   Tobacco use disorder 04/06/2023   Alcohol withdrawal (HCC) 10/19/2022   Essential hypertension 10/19/2022   Nausea & vomiting 10/19/2022   Hypomagnesemia 10/19/2022   Cervical radicular pain 10/29/2010    PCP: Rica Records, FNP  REFERRING PROVIDER: Rica Records, FNP  REFERRING DIAG: M54.50 (ICD-10-CM) - Lumbar pain   Rationale for Evaluation and Treatment: Rehabilitation  THERAPY DIAG:  Muscle weakness (generalized)  Low back pain, unspecified back pain laterality, unspecified chronicity, unspecified whether sciatica present  ONSET DATE: "numerous years"  SUBJECTIVE:  SUBJECTIVE STATEMENT: Pt reporting she was coming here earlier last year but was told by upfront staff to, "not come back until after December." Pt reporting constant back pain that has been severing limiting and is always in pain.   PERTINENT HISTORY:  Hx of knee pain, cervical pain, and see some bilateral UE numbness  PAIN:  Are you having pain? Yes: NPRS scale: 7/10 Pain location: low back/R hip Pain description: achy/dull Aggravating factors:  walking/standing Relieving factors: resting/laying down  PRECAUTIONS: None  RED FLAGS: None   WEIGHT BEARING RESTRICTIONS: No  FALLS:  Has patient fallen in last 6 months? No   PATIENT GOALS: "get out of pain"  NEXT MD VISIT:   OBJECTIVE:  Note: Objective measures were completed at Evaluation unless otherwise noted.  DIAGNOSTIC FINDINGS:  MRI:L3-4 there is a mild disc bulge flattening the ventral thecal sac and small right foraminal disc extrusion. Moderate-severe bilateral facet arthropathy with small facet effusions. Moderate central canal stenosis. Severe right foraminal stenosis. Moderate left foraminal stenosis.  PATIENT SURVEYS:  Modified Oswestry 38/50 = 76%   COGNITION: Overall cognitive status: Within functional limits for tasks assessed     SENSATION: WFL  POSTURE: rounded shoulders, forward head, increased lumbar lordosis, and anterior pelvic tilt  PALPATION: Tender to touch bilateral lumbar paraspinals  LUMBAR ROM:   AROM eval  Flexion 90% and painful  Extension 100% and painful  Right lateral flexion   Left lateral flexion   Right rotation   Left rotation    (Blank rows = not tested)  LOWER EXTREMITY ROM:     Active  Right eval Left eval  Hip flexion    Hip extension    Hip abduction    Hip adduction    Hip internal rotation    Hip external rotation    Knee flexion    Knee extension    Ankle dorsiflexion    Ankle plantarflexion    Ankle inversion    Ankle eversion     (Blank rows = not tested)  LOWER EXTREMITY MMT:    MMT Right eval Left eval  Hip flexion 4- 4-  Hip extension 3+ 4-  Hip abduction 3+ 4-  Hip adduction    Hip internal rotation    Hip external rotation    Knee flexion    Knee extension    Ankle dorsiflexion    Ankle plantarflexion    Ankle inversion    Ankle eversion     (Blank rows = not tested)  FUNCTIONAL TESTS:  30 Second Chair Stand Test: 10x Norms:   Age 44-64 23-69 46-74 75-79 46-84 85-89  90-94  Women 15 15 14 13 12 11 9   Men 17 16 15 14 13 11 9   2  minute walk test: 270ft  GAIT: Distance walked: > 329ft with total ambulation throughout session Assistive device utilized: None Level of assistance: Complete Independence Comments: externally rotated BLE during swing and stance phase with antalgia via RLE  TREATMENT DATE:   03/31/2024 PT Evaluation TA bracing x 10 seconds         Supine bridge x 10 for 3 hold 1x PPT Clamshell x 10 on L glutea  PATIENT EDUCATION:  Education details: PT Evaluation, findings, prognosis, frequency, attendance policy, and HEP. Person educated: Patient Education method: Medical illustrator Education comprehension: verbalized understanding  HOME EXERCISE PROGRAM: Access Code: [email or web address] URL: [email or web address] Date: 03/31/2024 Prepared by: Starling Manns  Exercises - Supine Transversus Abdominis Bracing - Hands on Stomach  - 1 x daily - 7 x weekly - 3 sets - 10 reps - Supine Bridge  - 1 x daily - 7 x weekly - 3 sets - 10 reps - 5 hold - Supine Posterior Pelvic Tilt  - 1 x daily - 7 x weekly - 3 sets - 10 reps - 10 hold - Clamshell  - 1 x daily - 7 x weekly - 3 sets - 10 reps - 5 hold - Supine Piriformis Stretch with Foot on Ground  - 1 x daily - 7 x weekly - 3 sets - 3 reps - 30 hold  ASSESSMENT:  CLINICAL IMPRESSION: Patient is a 53 y.o. female who was seen today for physical therapy evaluation and treatment for M54.50 (ICD-10-CM) - Lumbar pain. Pt reports severity of pain and symptoms are rated as severe but objective measures do not show same level of severity. Pt with chronic back pain in nature, pt also with poor attendance in previous PT POC. Pt is demonstrating functional limitations, reduced capacity for ambulation and functional mobility due to muscle weakness, ROM deficits, chronic  pain and poor lumbar posture. Pt will benefit from skilled Physical Therapy services to address deficits/limitations in order to improve functional and QOL.    OBJECTIVE IMPAIRMENTS: decreased activity tolerance, decreased balance, decreased mobility, decreased ROM, decreased strength, hypomobility, postural dysfunction, and pain.   ACTIVITY LIMITATIONS: carrying, lifting, bending, sitting, standing, squatting, sleeping, stairs, transfers, bed mobility, toileting, dressing, reach over head, hygiene/grooming, and locomotion level  PARTICIPATION LIMITATIONS: meal prep, cleaning, laundry, driving, shopping, community activity, occupation, and yard work  PERSONAL FACTORS: Age and Behavior pattern are also affecting patient's functional outcome.   REHAB POTENTIAL: Fair poor attendace  CLINICAL DECISION MAKING: Stable/uncomplicated  EVALUATION COMPLEXITY: Low   GOALS: Goals reviewed with patient? No  SHORT TERM GOALS: Target date: 04/28/24  Pt will be independent with HEP in order to demonstrate participation in Physical Therapy POC.  Baseline: Goal status: INITIAL  2.  Pt will report 4/10 pain with mobility in order to demonstrate improved pain with ADLs.  Baseline:  Goal status: INITIAL  LONG TERM GOALS: Target date: 05/26/2024  Pt will improve 30 Second Chair Stand test by at least 5 in order to demonstrate improved functional strength to return to desired activities.  Baseline: see objective.  Goal status: INITIAL  2.  Pt will improve 2 MWT by at least 142ft in order to demonstrate improved functional ambulatory capacity in community setting.  Baseline: see objective.  Goal status: INITIAL  3.  Pt will improve Modified Oswestry score by 15 points in order to demonstrate improved pain with functional goals and outcomes. Baseline: see objective.  Goal status: INITIAL  4.  Pt will report 3/10 pain with mobility in order to demonstrate reduced pain with ADLs lasting greater than 30  minutes.  Baseline: see objective.  Goal status: INITIAL  PLAN:  PT FREQUENCY: 1x/week  PT DURATION: 8 weeks *scheduling one visit at a time*  PLANNED INTERVENTIONS: 97164- PT Re-evaluation, 97750- Physical Performance Testing, 97110-Therapeutic exercises, 97530- Therapeutic activity, [id number]- Neuromuscular re-education, 97535- Self Care, 54098- Manual therapy, [id number]- Gait training, [phone]- Electrical stimulation (unattended), [phone]- Electrical stimulation (manual), Patient/Family education,  Balance training, Stair training, Taping, Dry Needling, Joint mobilization, Joint manipulation, Spinal manipulation, Spinal mobilization, Cryotherapy, and Moist heat.  PLAN FOR NEXT SESSION: one visit at a time; further lumbar ROM testing, gluteal weakness, posture,posture, posture  Elie Goody, DPT [facility] [place] [phone] office  Managed Medicaid Authorization Request  Visit Dx Codes: M54.50  M62.81  Functional Tool Score: 38/50  For all possible CPT codes, reference the Planned Interventions line above.     Check all conditions that are expected to impact treatment: {Conditions expected to impact treatment:Unknown   If treatment provided at initial evaluation, no treatment charged due to lack of authorization.      Nelida Meuse, PT 03/31/2024, 11:46 AM

## 2024-04-04 ENCOUNTER — Encounter: Payer: Self-pay | Admitting: Nurse Practitioner

## 2024-04-04 ENCOUNTER — Ambulatory Visit: Payer: MEDICAID | Attending: Nurse Practitioner | Admitting: Nurse Practitioner

## 2024-04-04 VITALS — BP 116/70 | HR 90 | Ht 63.0 in | Wt 188.0 lb

## 2024-04-04 DIAGNOSIS — I5032 Chronic diastolic (congestive) heart failure: Secondary | ICD-10-CM | POA: Diagnosis not present

## 2024-04-04 DIAGNOSIS — Z01810 Encounter for preprocedural cardiovascular examination: Secondary | ICD-10-CM | POA: Diagnosis not present

## 2024-04-04 DIAGNOSIS — J449 Chronic obstructive pulmonary disease, unspecified: Secondary | ICD-10-CM

## 2024-04-04 MED ORDER — ROSUVASTATIN CALCIUM 40 MG PO TABS: 40.0000 mg | ORAL_TABLET | Freq: Every day | ORAL | 1 refills | Status: DC

## 2024-04-04 MED ORDER — DILTIAZEM HCL ER COATED BEADS 120 MG PO CP24: 120.0000 mg | ORAL_CAPSULE | Freq: Every day | ORAL | 5 refills | Status: DC

## 2024-04-04 MED ORDER — METOPROLOL TARTRATE 25 MG PO TABS: 12.5000 mg | ORAL_TABLET | Freq: Two times a day (BID) | ORAL | 5 refills | Status: DC

## 2024-04-04 MED ORDER — OLMESARTAN MEDOXOMIL 20 MG PO TABS: 20.0000 mg | ORAL_TABLET | Freq: Every day | ORAL | 6 refills | Status: DC

## 2024-04-04 MED ORDER — POTASSIUM CHLORIDE ER 10 MEQ PO TBCR: 10.0000 meq | EXTENDED_RELEASE_TABLET | Freq: Every day | ORAL | 1 refills | Status: DC

## 2024-04-04 MED ORDER — FUROSEMIDE 20 MG PO TABS: 20.0000 mg | ORAL_TABLET | Freq: Every day | ORAL | 1 refills | Status: DC

## 2024-04-04 NOTE — Patient Instructions (Addendum)
Medication Instructions:  Your physician recommends that you continue on your current medications as directed. Please refer to the Current Medication list given to you today.   Labwork: None  Testing/Procedures: none  Follow-Up:  Your physician recommends that you schedule a follow-up appointment in: 6 months  Any Other Special Instructions Will Be Listed Below (If Applicable).  If you need a refill on your cardiac medications before your next appointment, please call your pharmacy.  

## 2024-04-04 NOTE — Progress Notes (Unsigned)
 Cardiology Office Note:  .   Date:  04/04/2024 ID:  Heidi [name], DOB 09/23/1971, MRN [id number] PCP: Rosanna Comment, FNP  [facility] Providers Cardiologist:  Armida Lander, MD    History of Present Illness: .   Heidi [name] is a 53 y.o. female with a PMH of HFpEF (dx 01/2024), chest pain, shortness of breath, palpitations, tachycardia, COPD, history of tobacco abuse, who presents today for scheduled follow-up.  Last seen by Dr. Armida Lander on January 06, 2024.  She noted palpitations occurring about 1-2 times per week, only lasting a few minutes.  Admitted to some shortness of breath and dizziness.  Her shortness of breath have been ongoing for about 6 months and noted with mild activities.  She noted improvement with her shortness of breath with by taking her inhaler.  PFTs were ordered, have not been done.  Monitor revealed sinus rhythm with average heart rate 99 bpm -see full report below.  ED visit on 02/08/2024 for chest pain, shortness of breath. EKG negative for anything acute. D-dimer slightly elevated. Troponins negative. CT angio revealed no PE, evidence of emphysema, and trace pericardial effusion. CXR negative for anything acute.   02/11/2024 - Today she presents for follow-up.  She presents with shortness of breath and chest pain. Admits to orthopnea, weight gain and swelling, particularly noted in her abdomen. Weight is up 15 lbs from earlier this month. She notices feeling fatigued. Admits to intermittent CP along left upper chest, described as dull. Noted to be worse with talking, denies any alleviating factors. Notices sensation of palpitations and tachycardia when laying down. Denies any syncope, presyncope, dizziness, PND, acute bleeding, or claudication. Our office has also received clearance regarding future dental procedure - see telephone note dated 02/03/2024.  04/04/2024 - She presents today for follow-up. Doing well and feeling better since I last  saw her. Tolerating her medications well. Denies any chest pain, shortness of breath, palpitations, syncope, presyncope, dizziness, orthopnea, PND, swelling or significant weight changes, acute bleeding, or claudication. Says she is pending oral surgery with Dr. Juleen Oakland in [place]. She tells me she has an ingrown tooth along her gumline. Our office has not received clearance forms faxed to our office.   ROS: Negative. See HPI.   Studies Reviewed: Aaron Aas    EKG: EKG is not ordered today.   Echo 02/2024:   1. Left ventricular ejection fraction, by estimation, is 60 to 65%. The  left ventricle has normal function. Left ventricular endocardial border  not optimally defined to evaluate regional wall motion. There is mild  concentric left ventricular  hypertrophy. Left ventricular diastolic parameters were normal.   2. [name] ventricular systolic function is normal. The [name] ventricular  size is normal. There is normal pulmonary artery systolic pressure. The  estimated [name] ventricular systolic pressure is 34.1 mmHg.   3. The mitral valve is grossly normal. Mild mitral valve regurgitation.   4. The aortic valve is tricuspid. Aortic valve regurgitation is not  visualized. No aortic stenosis is present. Aortic valve mean gradient  measures 3.0 mmHg.   5. The inferior vena cava is normal in size with greater than 50%  respiratory variability, suggesting [name] atrial pressure of 3 mmHg.   Comparison(s): No prior Echocardiogram.  Cardiac monitor (preliminary report) 12/2023: Patch Wear Time:  6 days and 10 hours (2025-01-16T10:53:25-0500 to 2025-01-22T21:12:17-0500)   Patient had a min HR of 58 bpm, max HR of 161 bpm, and avg HR of 99 bpm. Predominant  underlying rhythm was Sinus Rhythm. Isolated SVEs were rare (<1.0%), SVE Couplets were rare (<1.0%), and no SVE Triplets were present. Isolated VEs were rare (<1.0%),  and no VE Couplets or VE Triplets were present.  Risk Assessment/Calculations:     The 10-year ASCVD risk score (Arnett DK, et al., 2019) is: 2.6%   Values used to calculate the score:     Age: 23 years     Sex: Female     Is Non-Hispanic African American: Yes     Diabetic: No     Tobacco smoker: Yes     Systolic Blood Pressure: 116 mmHg     Is BP treated: Yes     HDL Cholesterol: 86 mg/dL     Total Cholesterol: 215 mg/dL  Physical Exam:   VS:  BP 116/70   Pulse 90   Ht 5\' 3"  (1.6 m)   Wt 188 lb (85.3 kg)   SpO2 98%   BMI 33.30 kg/m    Wt Readings from Last 3 Encounters:  04/04/24 188 lb (85.3 kg)  03/29/24 182 lb 8 oz (82.8 kg)  03/22/24 200 lb 9.9 oz (91 kg)    GEN: Obese, 53 y.o. female in no acute distress NECK: No JVD; No carotid bruits CARDIAC: S1/S2, RRR, no murmurs, rubs, gallops RESPIRATORY:  Clear to auscultation without rales, wheezing or rhonchi  EXTREMITIES: no edema to extremities; No deformity   ASSESSMENT AND PLAN: .    HFpEF Stage C, NYHA class I symptoms. Echo 02/2024 revealed normal LVEF, mild LVH, normal diastolic parameters. Previously noted 15 lbs weight gain within a little over 2 weeks with signs and symptoms of volume overload. Diuresed well with Lasix since last office visit. Weight has returned to her baseline. Continue current medication regimen. Will provide refills per her request. Low sodium diet, fluid restriction <2L, and daily weights encouraged. Educated to contact our office for weight gain of 2 lbs overnight or 5 lbs in one week. Care and ED precautions discussed.   COPD Breathing has improved after diuresis. Past PFT's showed some abnormalities. She is scheduled to see Pulmonology later this month. Continue current medication regimen. Continue to follow with PCP. Care and ED precautions discussed.   Pre-operative cardiovascular clearance She is pending future teeth extraction.  Ms. Rezek perioperative risk of a major cardiac event is 0.4% according to the Revised Cardiac Risk Index (RCRI).  Therefore, she is at low  risk for perioperative complications.   Her functional capacity is fair at 4.64 METs according to the Duke Activity Status Index (DASI). Recommendations: According to ACC/AHA guidelines, no further cardiovascular testing needed.  The patient may proceed to surgery at acceptable risk.   Antiplatelet and/or Anticoagulation Recommendations: Patient is not on aspirin or any blood thinners that need to be held prior to oral surgery. Ingrown tooth in gumline. Dr. Juleen Oakland.     Dispo: Follow-up with Dr. Armida Lander or APP in 6 months or sooner if anything changes.   Signed, Lasalle Pointer, NP

## 2024-04-05 ENCOUNTER — Telehealth: Payer: Self-pay | Admitting: Pharmacy Technician

## 2024-04-05 ENCOUNTER — Other Ambulatory Visit (HOSPITAL_COMMUNITY): Payer: Self-pay

## 2024-04-05 NOTE — Telephone Encounter (Signed)
 Pharmacy Patient Advocate Encounter   Received notification from CoverMyMeds that prior authorization for Lidocaine 5% patches is required/requested.   Insurance verification completed.   The patient is insured through [employer] [place] .   Per test claim: Refill too soon. PA is not needed at this time. Medication was filled 04/04/2024. Next eligible fill date is 04/27/2024.

## 2024-04-06 ENCOUNTER — Ambulatory Visit (HOSPITAL_COMMUNITY): Payer: MEDICAID

## 2024-04-06 ENCOUNTER — Encounter (HOSPITAL_COMMUNITY): Payer: Self-pay

## 2024-04-06 ENCOUNTER — Ambulatory Visit (HOSPITAL_COMMUNITY)
Admission: RE | Admit: 2024-04-06 | Discharge: 2024-04-06 | Disposition: A | Discharge status: Home / Self Care | Payer: MEDICAID | Source: Ambulatory Visit | Attending: Family Medicine | Admitting: Family Medicine

## 2024-04-06 DIAGNOSIS — M6281 Muscle weakness (generalized): Secondary | ICD-10-CM

## 2024-04-06 DIAGNOSIS — Z1231 Encounter for screening mammogram for malignant neoplasm of breast: Secondary | ICD-10-CM | POA: Diagnosis present

## 2024-04-06 DIAGNOSIS — M545 Low back pain, unspecified: Secondary | ICD-10-CM

## 2024-04-06 NOTE — Therapy (Signed)
 OUTPATIENT PHYSICAL THERAPY THORACOLUMBAR TREATMENT   Patient Name: Heidi [name] MRN: [id number] DOB:1971/09/15, 53 y.o., female Today's Date: 04/06/2024  END OF SESSION:  PT End of Session - 04/06/24 1415     Visit Number 2    Number of Visits 8    Date for PT Re-Evaluation 05/26/24    Authorization Type [employer]    Authorization Time Period [employer] approved 7 visits from 03/31/2024-09/27/2024    Authorization - Visit Number 1    Authorization - Number of Visits 7    Progress Note Due on Visit 8    PT Start Time 1348    PT Stop Time 1430    PT Time Calculation (min) 42 min    Activity Tolerance Patient tolerated treatment well    Behavior During Therapy [facility] for tasks assessed/performed              Past Medical History:  Diagnosis Date   Alcohol withdrawal (HCC)    Anxiety    Cervical radiculopathy    Depression    Diabetes mellitus without complication (HCC)    GERD (gastroesophageal reflux disease)    Hypertension    Past Surgical History:  Procedure Laterality Date   BIOPSY  11/30/2023   Procedure: BIOPSY;  Surgeon: Lanelle Bal, DO;  Location: [facility];  Service: Endoscopy;;   BRAIN SURGERY     COLONOSCOPY WITH PROPOFOL N/A 11/30/2023   Surgeon: Earnest Bailey K, DO;   5 mm polyp resected and retrieved, stool throughout the entire colon.  Pathology with tubular adenoma.  Recommended 5-year surveillance.   ESOPHAGOGASTRODUODENOSCOPY (EGD) WITH PROPOFOL N/A 11/30/2023   Surgeon: Lanelle Bal, DO;  Small hiatal hernia, gastritis biopsied, duodenitis.  Pathology with reactive gastropathy and focal erosion, negative for H. pylori. Recommended PPI BID.   FLEXIBLE SIGMOIDOSCOPY N/A 07/13/2023   Procedure: FLEXIBLE SIGMOIDOSCOPY;  Surgeon: Dolores Frame, MD;  Location: [facility];  Service: Gastroenterology;  Laterality: N/A;   POLYPECTOMY  11/30/2023   Procedure: POLYPECTOMY;  Surgeon: Lanelle Bal, DO;  Location: [facility];  Service: Endoscopy;;   Patient Active Problem List   Diagnosis Date Noted   Chest pain 02/08/2024   Lumbar facet arthropathy 12/28/2023   Chronic pain syndrome 12/28/2023   Cervical cancer screening 10/26/2023   Degenerative disc disease, lumbar 08/11/2023   Hospital discharge follow-up 07/29/2023   Laceration of spleen 07/24/2023   Ileus (HCC) 07/13/2023   Elevated LFTs 07/12/2023   Colonic obstruction (HCC) 07/12/2023   Hypothermia 07/09/2023   Hypokalemia 07/06/2023   Thrombocytopenia (HCC) 07/06/2023   Chronic back pain 05/18/2023   Bilateral knee pain 04/26/2023   Depression, recurrent (HCC) 04/26/2023   Alcohol use disorder 04/20/2023   Carpal tunnel syndrome of right wrist 04/20/2023   Tachycardia 04/20/2023   Transaminitis 04/06/2023   GERD (gastroesophageal reflux disease) 04/06/2023   Tobacco use disorder 04/06/2023   Alcohol withdrawal (HCC) 10/19/2022   Essential hypertension 10/19/2022   Nausea & vomiting 10/19/2022   Hypomagnesemia 10/19/2022   Cervical radicular pain 10/29/2010    PCP: Rica Records, FNP  REFERRING PROVIDER: Rica Records, FNP  REFERRING DIAG: M54.50 (ICD-10-CM) - Lumbar pain   Rationale for Evaluation and Treatment: Rehabilitation  THERAPY DIAG:  Muscle weakness (generalized)  Low back pain, unspecified back pain laterality, unspecified chronicity, unspecified whether sciatica present  Chronic bilateral low back pain without sciatica  ONSET DATE: "numerous years"  SUBJECTIVE:  SUBJECTIVE STATEMENT: 04/06/24:  Pt stated she has dull achy constant pain especially while standing, pt scale 7/10.  Has began HEP without questions.  Reports she has difficulty sleeping for greater than 2 hours.  Eval:  Pt reporting she was  coming here earlier last year but was told by upfront staff to, "not come back until after December." Pt reporting constant back pain that has been severing limiting and is always in pain.   PERTINENT HISTORY:  Hx of knee pain, cervical pain, and see some bilateral UE numbness  PAIN:  Are you having pain? Yes: NPRS scale: 7/10 Pain location: low back/R hip Pain description: achy/dull Aggravating factors: walking/standing Relieving factors: resting/laying down  PRECAUTIONS: None  RED FLAGS: None   WEIGHT BEARING RESTRICTIONS: No  FALLS:  Has patient fallen in last 6 months? No   PATIENT GOALS: "get out of pain"  NEXT MD VISIT:   OBJECTIVE:  Note: Objective measures were completed at Evaluation unless otherwise noted.  DIAGNOSTIC FINDINGS:  MRI:L3-4 there is a mild disc bulge flattening the ventral thecal sac and small right foraminal disc extrusion. Moderate-severe bilateral facet arthropathy with small facet effusions. Moderate central canal stenosis. Severe right foraminal stenosis. Moderate left foraminal stenosis.  PATIENT SURVEYS:  Modified Oswestry 38/50 = 76%   COGNITION: Overall cognitive status: Within functional limits for tasks assessed     SENSATION: WFL  POSTURE: rounded shoulders, forward head, increased lumbar lordosis, and anterior pelvic tilt  PALPATION: Tender to touch bilateral lumbar paraspinals  LUMBAR ROM:   AROM eval 04/06/24  Flexion 90% and painful   Extension 100% and painful   Right lateral flexion  100% c/o tightness on Lt QL region, hand past knee  Left lateral flexion  100% painfree with hand past knee  Right rotation  50% painful center of  and c/o tightness  Left rotation  50% painful and c/o tightness   (Blank rows = not tested)  LOWER EXTREMITY ROM:     Active  Right eval Left eval  Hip flexion    Hip extension    Hip abduction    Hip adduction    Hip internal rotation    Hip external rotation    Knee flexion     Knee extension    Ankle dorsiflexion    Ankle plantarflexion    Ankle inversion    Ankle eversion     (Blank rows = not tested)  LOWER EXTREMITY MMT:    MMT Right eval Left eval  Hip flexion 4- 4-  Hip extension 3+ 4-  Hip abduction 3+ 4-  Hip adduction    Hip internal rotation    Hip external rotation    Knee flexion    Knee extension    Ankle dorsiflexion    Ankle plantarflexion    Ankle inversion    Ankle eversion     (Blank rows = not tested)  FUNCTIONAL TESTS:  30 Second Chair Stand Test: 10x Norms:   Age 39-64 47-69 70-74 75-79 23-84 85-89 90-94  Women 15 15 14 13 12 11 9   Men 17 16 15 14 13 11 9   2  minute walk test: 271ft  GAIT: Distance walked: > 344ft with total ambulation throughout session Assistive device utilized: None Level of assistance: Complete Independence Comments: externally rotated BLE during swing and stance phase with antalgia via RLE  TREATMENT DATE:  04/06/24: Reviewed goals ROM assessment -see above  Supine:  Instructed log rolling Posterior pelvic tilt 10x 3" TA  bracing 10x 5" Decompression 2-5 5x 5" Bridge 10x 5" LTR 5x 10"  Sidelying 10x 5" BLE with RTB around thigh  Seated: STS 10x no HHA eccentric control  03/31/2024 PT Evaluation TA bracing x 10 seconds         Supine bridge x 10 for 3 hold 1x PPT Clamshell x 10 on L glutea                                                                                                                          PATIENT EDUCATION:  Education details: PT Evaluation, findings, prognosis, frequency, attendance policy, and HEP. Person educated: Patient Education method: Medical illustrator Education comprehension: verbalized understanding  HOME EXERCISE PROGRAM: Access Code: [email or web address] URL: [email or web address] Date: 03/31/2024 Prepared by: Starling Manns  Exercises - Supine Transversus Abdominis Bracing - Hands on Stomach  - 1 x daily - 7 x weekly - 3  sets - 10 reps - Supine Bridge  - 1 x daily - 7 x weekly - 3 sets - 10 reps - 5 hold - Supine Posterior Pelvic Tilt  - 1 x daily - 7 x weekly - 3 sets - 10 reps - 10 hold - Clamshell  - 1 x daily - 7 x weekly - 3 sets - 10 reps - 5 hold - Supine Piriformis Stretch with Foot on Ground  - 1 x daily - 7 x weekly - 3 sets - 3 reps - 30 hold  04/06/24:  Decompression  ASSESSMENT:  CLINICAL IMPRESSION: 04/06/24:  Reviewed goal and educated importance of HEP compliance.  Pt able to recall exercises with some cueing for form and mechanics.  Added exercises for mobility, postural and posterior chain strengthening with positive reports afterwards, added decompression to HEP with printout given and verbalized understanding.  No reports of increased pain through session.  Eval:  Patient is a 53 y.o. female who was seen today for physical therapy evaluation and treatment for M54.50 (ICD-10-CM) - Lumbar pain. Pt reports severity of pain and symptoms are rated as severe but objective measures do not show same level of severity. Pt with chronic back pain in nature, pt also with poor attendance in previous PT POC. Pt is demonstrating functional limitations, reduced capacity for ambulation and functional mobility due to muscle weakness, ROM deficits, chronic pain and poor lumbar posture. Pt will benefit from skilled Physical Therapy services to address deficits/limitations in order to improve functional and QOL.    OBJECTIVE IMPAIRMENTS: decreased activity tolerance, decreased balance, decreased mobility, decreased ROM, decreased strength, hypomobility, postural dysfunction, and pain.   ACTIVITY LIMITATIONS: carrying, lifting, bending, sitting, standing, squatting, sleeping, stairs, transfers, bed mobility, toileting, dressing, reach over head, hygiene/grooming, and locomotion level  PARTICIPATION LIMITATIONS: meal prep, cleaning, laundry, driving, shopping, community activity, occupation, and yard work  PERSONAL  FACTORS: Age and Behavior pattern are also affecting patient's functional outcome.   REHAB POTENTIAL: Fair poor attendace  CLINICAL DECISION MAKING: Stable/uncomplicated  EVALUATION COMPLEXITY: Low  GOALS: Goals reviewed with patient? No  SHORT TERM GOALS: Target date: 04/28/24  Pt will be independent with HEP in order to demonstrate participation in Physical Therapy POC.  Baseline: Goal status: INITIAL  2.  Pt will report 4/10 pain with mobility in order to demonstrate improved pain with ADLs.  Baseline:  Goal status: INITIAL  LONG TERM GOALS: Target date: 05/26/2024  Pt will improve 30 Second Chair Stand test by at least 5 in order to demonstrate improved functional strength to return to desired activities.  Baseline: see objective.  Goal status: INITIAL  2.  Pt will improve 2 MWT by at least 135ft in order to demonstrate improved functional ambulatory capacity in community setting.  Baseline: see objective.  Goal status: INITIAL  3.  Pt will improve Modified Oswestry score by 15 points in order to demonstrate improved pain with functional goals and outcomes. Baseline: see objective.  Goal status: INITIAL  4.  Pt will report 3/10 pain with mobility in order to demonstrate reduced pain with ADLs lasting greater than 30 minutes.  Baseline: see objective.  Goal status: INITIAL  PLAN:  PT FREQUENCY: 1x/week  PT DURATION: 8 weeks *scheduling one visit at a time*  PLANNED INTERVENTIONS: 97164- PT Re-evaluation, 97750- Physical Performance Testing, 97110-Therapeutic exercises, 97530- Therapeutic activity, [id number]- Neuromuscular re-education, 97535- Self Care, 16109- Manual therapy, [phone]- Gait training, [phone]- Electrical stimulation (unattended), [phone]- Electrical stimulation (manual), Patient/Family education, Balance training, Stair training, Taping, Dry Needling, Joint mobilization, Joint manipulation, Spinal manipulation, Spinal mobilization, Cryotherapy, and Moist  heat.  PLAN FOR NEXT SESSION: one visit at a time; gluteal weakness, posture,posture, posture  Minor Amble, LPTA/CLT; CBIS [phone]  Alesia Anchors, PTA 04/06/2024, 2:34 PM

## 2024-04-10 NOTE — Telephone Encounter (Signed)
 Scott Jensen's office has sent another clearance for the same procedure.

## 2024-04-10 NOTE — Telephone Encounter (Signed)
   Patient Name: Heidi [name]  DOB: Jan 27, 1971 MRN: [id number]  Primary Cardiologist: Armida Lander, MD  Chart reviewed as part of pre-operative protocol coverage. Given past medical history and time since last visit, based on ACC/AHA guidelines, Heidi [name] is at acceptable risk for the planned procedure without further cardiovascular testing.   Patient was seen by Lasalle Pointer, NP on 04/04/2024 and was doing well.  She was cleared for procedure at the time.  I will route this recommendation as well as the note from most recent office visit to the requesting party via Epic fax function and remove from pre-op pool.  Please call with questions.  Jude Norton, NP 04/10/2024, 3:36 PM

## 2024-04-11 ENCOUNTER — Ambulatory Visit (HOSPITAL_COMMUNITY): Payer: MEDICAID

## 2024-04-11 ENCOUNTER — Encounter (HOSPITAL_COMMUNITY): Payer: Self-pay

## 2024-04-11 DIAGNOSIS — M6281 Muscle weakness (generalized): Secondary | ICD-10-CM | POA: Diagnosis not present

## 2024-04-11 DIAGNOSIS — G8929 Other chronic pain: Secondary | ICD-10-CM

## 2024-04-11 DIAGNOSIS — M545 Low back pain, unspecified: Secondary | ICD-10-CM

## 2024-04-11 NOTE — Therapy (Signed)
 OUTPATIENT PHYSICAL THERAPY THORACOLUMBAR TREATMENT   Patient Name: Heidi [name] MRN: [id number] DOB:10/15/1971, 53 y.o., female Today's Date: 04/11/2024  END OF SESSION:  PT End of Session - 04/11/24 1226     Visit Number 3    Number of Visits 8    Date for PT Re-Evaluation 05/26/24    Authorization Type [employer]    Authorization Time Period [employer] approved 7 visits from 03/31/2024-09/27/2024    Authorization - Visit Number 2    Authorization - Number of Visits 7    Progress Note Due on Visit 8    PT Start Time 1148    PT Stop Time 1226    PT Time Calculation (min) 38 min    Activity Tolerance Patient tolerated treatment well    Behavior During Therapy [facility] for tasks assessed/performed               Past Medical History:  Diagnosis Date   Alcohol withdrawal (HCC)    Anxiety    Cervical radiculopathy    Depression    Diabetes mellitus without complication (HCC)    GERD (gastroesophageal reflux disease)    Hypertension    Past Surgical History:  Procedure Laterality Date   BIOPSY  11/30/2023   Procedure: BIOPSY;  Surgeon: Vinetta Greening, DO;  [facility]: [facility];  Service: Endoscopy;;   BRAIN SURGERY     COLONOSCOPY WITH PROPOFOL  N/A 11/30/2023   Surgeon: Goble Last K, DO;   5 mm polyp resected and retrieved, stool throughout the entire colon.  Pathology with tubular adenoma.  Recommended 5-year surveillance.   ESOPHAGOGASTRODUODENOSCOPY (EGD) WITH PROPOFOL  N/A 11/30/2023   Surgeon: Vinetta Greening, DO;  Small hiatal hernia, gastritis biopsied, duodenitis.  Pathology with reactive gastropathy and focal erosion, negative for H. pylori. Recommended PPI BID.   FLEXIBLE SIGMOIDOSCOPY N/A 07/13/2023   Procedure: FLEXIBLE SIGMOIDOSCOPY;  Surgeon: Urban Garden, MD;  Location: [facility];  Service: Gastroenterology;  Laterality: N/A;   POLYPECTOMY  11/30/2023   Procedure: POLYPECTOMY;  Surgeon: Vinetta Greening, DO;  Location: [facility];  Service: Endoscopy;;   Patient Active Problem List   Diagnosis Date Noted   Chest pain 02/08/2024   Lumbar facet arthropathy 12/28/2023   Chronic pain syndrome 12/28/2023   Cervical cancer screening 10/26/2023   Degenerative disc disease, lumbar 08/11/2023   Hospital discharge follow-up 07/29/2023   Laceration of spleen 07/24/2023   Ileus (HCC) 07/13/2023   Elevated LFTs 07/12/2023   Colonic obstruction (HCC) 07/12/2023   Hypothermia 07/09/2023   Hypokalemia 07/06/2023   Thrombocytopenia (HCC) 07/06/2023   Chronic back pain 05/18/2023   Bilateral knee pain 04/26/2023   Depression, recurrent (HCC) 04/26/2023   Alcohol use disorder 04/20/2023   Carpal tunnel syndrome of right wrist 04/20/2023   Tachycardia 04/20/2023   Transaminitis 04/06/2023   GERD (gastroesophageal reflux disease) 04/06/2023   Tobacco use disorder 04/06/2023   Alcohol withdrawal (HCC) 10/19/2022   Essential hypertension 10/19/2022   Nausea & vomiting 10/19/2022   Hypomagnesemia 10/19/2022   Cervical radicular pain 10/29/2010    PCP: Rosanna Comment, FNP  REFERRING PROVIDER: Rosanna Comment, FNP  REFERRING DIAG: M54.50 (ICD-10-CM) - Lumbar pain   Rationale for Evaluation and Treatment: Rehabilitation  THERAPY DIAG:  Muscle weakness (generalized)  Low back pain, unspecified back pain laterality, unspecified chronicity, unspecified whether sciatica present  Chronic bilateral low back pain without sciatica  ONSET DATE: "numerous years"  SUBJECTIVE:  SUBJECTIVE STATEMENT: 04/11/2024: Pt reporting a sharp left pain in low back still bothersome. Reports 9/10 today.   Eval:  Pt reporting she was coming here earlier last year but was told by upfront staff to, "not come back until after  December." Pt reporting constant back pain that has been severing limiting and is always in pain.   PERTINENT HISTORY:  Hx of knee pain, cervical pain, and see some bilateral UE numbness  PAIN:  Are you having pain? Yes: NPRS scale: 7/10 Pain location: low back/R hip Pain description: achy/dull Aggravating factors: walking/standing Relieving factors: resting/laying down  PRECAUTIONS: None  RED FLAGS: None   WEIGHT BEARING RESTRICTIONS: No  FALLS:  Has patient fallen in last 6 months? No   PATIENT GOALS: "get out of pain"  NEXT MD VISIT:   OBJECTIVE:  Note: Objective measures were completed at Evaluation unless otherwise noted.  DIAGNOSTIC FINDINGS:  MRI:L3-4 there is a mild disc bulge flattening the ventral thecal sac and small right foraminal disc extrusion. Moderate-severe bilateral facet arthropathy with small facet effusions. Moderate central canal stenosis. Severe right foraminal stenosis. Moderate left foraminal stenosis.  PATIENT SURVEYS:  Modified Oswestry 38/50 = 76%   COGNITION: Overall cognitive status: Within functional limits for tasks assessed     SENSATION: WFL  POSTURE: rounded shoulders, forward head, increased lumbar lordosis, and anterior pelvic tilt  PALPATION: Tender to touch bilateral lumbar paraspinals  LUMBAR ROM:   AROM eval 04/06/24  Flexion 90% and painful   Extension 100% and painful   Right lateral flexion  100% c/o tightness on Lt QL region, hand past knee  Left lateral flexion  100% painfree with hand past knee  Right rotation  50% painful center of  and c/o tightness  Left rotation  50% painful and c/o tightness   (Blank rows = not tested)  LOWER EXTREMITY ROM:     Active  Right eval Left eval  Hip flexion    Hip extension    Hip abduction    Hip adduction    Hip internal rotation    Hip external rotation    Knee flexion    Knee extension    Ankle dorsiflexion    Ankle plantarflexion    Ankle inversion     Ankle eversion     (Blank rows = not tested)  LOWER EXTREMITY MMT:    MMT Right eval Left eval  Hip flexion 4- 4-  Hip extension 3+ 4-  Hip abduction 3+ 4-  Hip adduction    Hip internal rotation    Hip external rotation    Knee flexion    Knee extension    Ankle dorsiflexion    Ankle plantarflexion    Ankle inversion    Ankle eversion     (Blank rows = not tested)  FUNCTIONAL TESTS:  30 Second Chair Stand Test: 10x Norms:   Age 38-64 83-69 62-74 75-79 7-84 30-89 56-94  Women 15 15 14 13 12 11 9   Men 17 16 15 14 13 11 9   2  minute walk test: 242ft  GAIT: Distance walked: > 347ft with total ambulation throughout session Assistive device utilized: None Level of assistance: Complete Independence Comments: externally rotated BLE during swing and stance phase with antalgia via RLE  TREATMENT DATE:  04/11/2024  -Standing iliopsoas stretch with neuro-muscular re-ed tactile cues at glutes 3 x 30'' -7in eccentric step down with BUE support- cues for hip hinge to facilitate glute activation x 8 bilaterally- terminated early to  OOB- HR: 120 bpm  SpO2: 94%   -Supine bridge with RTB at knees x 15 w/ 3'' isometric  88% SpO2 reading in supine -R side clamshells with RTB around knees x 15 w/ 3'' hold -Seated palloff press with RTBx x12 bilaterally with cues for TA bracing -Seated marching with BUE shoulder extension with RTB 2 x 30'' -Supine piriformis stretch 2x30''  04/06/24: Reviewed goals ROM assessment -see above  Supine:  Instructed log rolling Posterior pelvic tilt 10x 3" TA bracing 10x 5" Decompression 2-5 5x 5" Bridge 10x 5" LTR 5x 10"  Sidelying 10x 5" BLE with RTB around thigh  Seated: STS 10x no HHA eccentric control  03/31/2024 PT Evaluation TA bracing x 10 seconds         Supine bridge x 10 for 3 hold 1x PPT Clamshell x 10 on L glutea                                                                                                                           PATIENT EDUCATION:  Education details: PT Evaluation, findings, prognosis, frequency, attendance policy, and HEP. Person educated: Patient Education method: Medical illustrator Education comprehension: verbalized understanding  HOME EXERCISE PROGRAM: Access Code: [email or web address] URL: [email or web address] Date: 03/31/2024 Prepared by: Irene Mannheim  Exercises - Supine Transversus Abdominis Bracing - Hands on Stomach  - 1 x daily - 7 x weekly - 3 sets - 10 reps - Supine Bridge  - 1 x daily - 7 x weekly - 3 sets - 10 reps - 5 hold - Supine Posterior Pelvic Tilt  - 1 x daily - 7 x weekly - 3 sets - 10 reps - 10 hold - Clamshell  - 1 x daily - 7 x weekly - 3 sets - 10 reps - 5 hold - Supine Piriformis Stretch with Foot on Ground  - 1 x daily - 7 x weekly - 3 sets - 3 reps - 30 hold  04/06/24:  Decompression  ASSESSMENT:  CLINICAL IMPRESSION: Pt tolerating treatment well. Fatigues easily with standing stretching this session. Pt reports her L LB pain is eased up throughout session. At EOS pain is 7/10. Education regarding low oxygen levels and potential correlation with smoking, informed about talking with provider. Pt will benefit from skilled Physical Therapy services to address deficits/limitations in order to improve functional and QOL.   Eval:  Patient is a 53 y.o. female who was seen today for physical therapy evaluation and treatment for M54.50 (ICD-10-CM) - Lumbar pain. Pt reports severity of pain and symptoms are rated as severe but objective measures do not show same level of severity. Pt with chronic back pain in nature, pt also with poor attendance in previous PT POC. Pt is demonstrating functional limitations, reduced capacity for ambulation and functional mobility due to muscle weakness, ROM deficits, chronic pain and poor lumbar posture. Pt will benefit from skilled Physical Therapy services to address deficits/limitations in order to improve functional  and  QOL.    OBJECTIVE IMPAIRMENTS: decreased activity tolerance, decreased balance, decreased mobility, decreased ROM, decreased strength, hypomobility, postural dysfunction, and pain.   ACTIVITY LIMITATIONS: carrying, lifting, bending, sitting, standing, squatting, sleeping, stairs, transfers, bed mobility, toileting, dressing, reach over head, hygiene/grooming, and locomotion level  PARTICIPATION LIMITATIONS: meal prep, cleaning, laundry, driving, shopping, community activity, occupation, and yard work  PERSONAL FACTORS: Age and Behavior pattern are also affecting patient's functional outcome.   REHAB POTENTIAL: Fair poor attendace  CLINICAL DECISION MAKING: Stable/uncomplicated  EVALUATION COMPLEXITY: Low   GOALS: Goals reviewed with patient? No  SHORT TERM GOALS: Target date: 04/28/24  Pt will be independent with HEP in order to demonstrate participation in Physical Therapy POC.  Baseline: Goal status: INITIAL  2.  Pt will report 4/10 pain with mobility in order to demonstrate improved pain with ADLs.  Baseline:  Goal status: INITIAL  LONG TERM GOALS: Target date: 05/26/2024  Pt will improve 30 Second Chair Stand test by at least 5 in order to demonstrate improved functional strength to return to desired activities.  Baseline: see objective.  Goal status: INITIAL  2.  Pt will improve 2 MWT by at least 143ft in order to demonstrate improved functional ambulatory capacity in community setting.  Baseline: see objective.  Goal status: INITIAL  3.  Pt will improve Modified Oswestry score by 15 points in order to demonstrate improved pain with functional goals and outcomes. Baseline: see objective.  Goal status: INITIAL  4.  Pt will report 3/10 pain with mobility in order to demonstrate reduced pain with ADLs lasting greater than 30 minutes.  Baseline: see objective.  Goal status: INITIAL  PLAN:  PT FREQUENCY: 1x/week  PT DURATION: 8 weeks *scheduling one visit at a  time*  PLANNED INTERVENTIONS: 97164- PT Re-evaluation, 97750- Physical Performance Testing, 97110-Therapeutic exercises, 97530- Therapeutic activity, [id number]- Neuromuscular re-education, 97535- Self Care, 28413- Manual therapy, [phone]- Gait training, [phone]- Electrical stimulation (unattended), [phone]- Electrical stimulation (manual), Patient/Family education, Balance training, Stair training, Taping, Dry Needling, Joint mobilization, Joint manipulation, Spinal manipulation, Spinal mobilization, Cryotherapy, and Moist heat.  PLAN FOR NEXT SESSION: one visit at a time; gluteal weakness, posture,posture, posture  Astrid Lay, DPT [facility] [place] [address] [phone] office  Gatha Kaska, PT 04/11/2024, 12:26 PM

## 2024-04-12 ENCOUNTER — Telehealth: Payer: Self-pay

## 2024-04-13 ENCOUNTER — Encounter: Payer: Self-pay | Admitting: Family Medicine

## 2024-04-13 ENCOUNTER — Ambulatory Visit (INDEPENDENT_AMBULATORY_CARE_PROVIDER_SITE_OTHER): Payer: MEDICAID | Admitting: Family Medicine

## 2024-04-13 VITALS — BP 118/79 | HR 108 | Ht 63.0 in | Wt 190.1 lb

## 2024-04-13 DIAGNOSIS — R739 Hyperglycemia, unspecified: Secondary | ICD-10-CM

## 2024-04-13 DIAGNOSIS — Z8709 Personal history of other diseases of the respiratory system: Secondary | ICD-10-CM

## 2024-04-13 DIAGNOSIS — I10 Essential (primary) hypertension: Secondary | ICD-10-CM | POA: Diagnosis not present

## 2024-04-13 DIAGNOSIS — E119 Type 2 diabetes mellitus without complications: Secondary | ICD-10-CM

## 2024-04-13 DIAGNOSIS — E1159 Type 2 diabetes mellitus with other circulatory complications: Secondary | ICD-10-CM | POA: Diagnosis not present

## 2024-04-13 MED ORDER — VARENICLINE TARTRATE 0.5 MG PO TABS: 0.5000 mg | ORAL_TABLET | Freq: Two times a day (BID) | ORAL | 0 refills | Status: DC

## 2024-04-13 MED ORDER — ONDANSETRON HCL 4 MG PO TABS: 4.0000 mg | ORAL_TABLET | Freq: Three times a day (TID) | ORAL | 3 refills | Status: DC | PRN

## 2024-04-13 MED ORDER — ALBUTEROL SULFATE (2.5 MG/3ML) 0.083% IN NEBU: 2.5000 mg | INHALATION_SOLUTION | Freq: Four times a day (QID) | RESPIRATORY_TRACT | 1 refills | Status: DC | PRN

## 2024-04-13 MED ORDER — DILTIAZEM HCL ER COATED BEADS 120 MG PO CP24
120.0000 mg | ORAL_CAPSULE | Freq: Every day | ORAL | 5 refills | Status: AC
Start: 2024-04-13 — End: ?

## 2024-04-13 MED ORDER — LEVOCETIRIZINE DIHYDROCHLORIDE 5 MG PO TABS: 5.0000 mg | ORAL_TABLET | Freq: Every evening | ORAL | 5 refills | Status: DC

## 2024-04-13 MED ORDER — CYCLOBENZAPRINE HCL 5 MG PO TABS: 5.0000 mg | ORAL_TABLET | Freq: Two times a day (BID) | ORAL | 2 refills | Status: DC | PRN

## 2024-04-13 MED ORDER — METOPROLOL TARTRATE 25 MG PO TABS: 12.5000 mg | ORAL_TABLET | Freq: Two times a day (BID) | ORAL | 5 refills | Status: DC

## 2024-04-13 NOTE — Patient Instructions (Signed)

## 2024-04-13 NOTE — Progress Notes (Signed)
 Established Patient Office Visit   Subjective  Patient ID: Heidi [name], female    DOB: 1971/09/08  Age: 53 y.o. MRN: [id number]  Chief Complaint  Patient presents with   Medical Management of Chronic Issues    Hypertension Would like to refill zofran      She  has a past medical history of Alcohol withdrawal (HCC), Anxiety, Cervical radiculopathy, Depression, Diabetes mellitus without complication (HCC), GERD (gastroesophageal reflux disease), and Hypertension.  HPI Patient presents to the clinic for chronic follow up. For the details of today's visit, please refer to assessment and plan.   Review of Systems  Constitutional:  Negative for chills and fever.  Respiratory:  Negative for shortness of breath.   Cardiovascular:  Negative for chest pain.  Neurological:  Negative for dizziness and headaches.    Objective:     BP 118/79   Pulse (!) 108   Ht 5\' 3"  (1.6 m)   Wt 190 lb 1.3 oz (86.2 kg)   SpO2 93%   BMI 33.67 kg/m  BP Readings from Last 3 Encounters:  04/13/24 118/79  04/04/24 116/70  03/29/24 117/81      Physical Exam Vitals reviewed.  Constitutional:      General: She is not in acute distress.    Appearance: Normal appearance. She is not ill-appearing, toxic-appearing or diaphoretic.  HENT:     Head: Normocephalic.  Eyes:     General:        Right eye: No discharge.        Left eye: No discharge.     Conjunctiva/sclera: Conjunctivae normal.  Cardiovascular:     Rate and Rhythm: Normal rate.     Pulses: Normal pulses.     Heart sounds: Normal heart sounds.  Pulmonary:     Effort: Pulmonary effort is normal. No respiratory distress.     Breath sounds: Normal breath sounds.  Abdominal:     General: Bowel sounds are normal.     Palpations: Abdomen is soft.     Tenderness: There is no abdominal tenderness. There is no right CVA tenderness, left CVA tenderness or guarding.  Skin:    General: Skin is warm and dry.     Capillary Refill: Capillary  refill takes less than 2 seconds.  Neurological:     Mental Status: She is alert.     Coordination: Coordination normal.     Gait: Gait normal.  Psychiatric:        Mood and Affect: Mood normal.        Behavior: Behavior normal.      No results found for any visits on 04/13/24.  The 10-year ASCVD risk score (Arnett DK, et al., 2019) is: 2.8%    Assessment & Plan:  Hyperglycemia -     BMP8+eGFR  History of asthma -     Albuterol  Sulfate; Take 3 mLs (2.5 mg total) by nebulization every 6 (six) hours as needed for wheezing or shortness of breath.  Dispense: 150 mL; Refill: 1  Claustrophobia  Type 2 diabetes mellitus without complication, without long-term current use of insulin  (HCC) -     Ambulatory referral to Ophthalmology  Essential hypertension Assessment & Plan: Vitals:   04/13/24 1017  BP: 118/79    Controlled, Continue Diltiazem  (Cardizem  CD) 120 mg once daily, Metoprolol  tartrate 12,5 mg twice daily, Clonidine  (Catapres ) 0.2 mg/24-hour patch  Labs ordered. Discussed with  patient to monitor their blood pressure regularly and maintain a heart-healthy diet rich in fruits, vegetables,  whole grains, and low-fat dairy, while reducing sodium intake to less than 2,300 mg per day. Regular physical activity, such as 30 minutes of moderate exercise most days of the week, will help lower blood pressure and improve overall cardiovascular health. Avoiding smoking, limiting alcohol consumption, and managing stress. Take  prescribed medication, & take it as directed and avoid skipping doses. Seek emergency care if your blood pressure is (over 180/100) or you experience chest pain, shortness of breath, or sudden vision changes.Patient verbalizes understanding regarding plan of care and all questions answered.    Other orders -     Cyclobenzaprine  HCl; Take 1 tablet (5 mg total) by mouth 2 (two) times daily as needed for muscle spasms.  Dispense: 60 tablet; Refill: 2 -     Ondansetron   HCl; Take 1 tablet (4 mg total) by mouth every 8 (eight) hours as needed for nausea or vomiting.  Dispense: 20 tablet; Refill: 3 -     Metoprolol  Tartrate; Take 0.5 tablets (12.5 mg total) by mouth 2 (two) times daily.  Dispense: 60 tablet; Refill: 5 -     dilTIAZem  HCl ER Coated Beads; Take 1 capsule (120 mg total) by mouth daily.  Dispense: 30 capsule; Refill: 5 -     Levocetirizine Dihydrochloride ; Take 1 tablet (5 mg total) by mouth every evening.  Dispense: 30 tablet; Refill: 5 -     Varenicline  Tartrate; Take 1 tablet (0.5 mg total) by mouth 2 (two) times daily.  Dispense: 60 tablet; Refill: 0    Return in about 2 months (around 06/13/2024), or if symptoms worsen or fail to improve, for type 2 diabetes.   Avelino Lek Amber Bail, FNP

## 2024-04-13 NOTE — Assessment & Plan Note (Signed)
 Vitals:   04/13/24 1017  BP: 118/79    Controlled, Continue Diltiazem  (Cardizem  CD) 120 mg once daily, Metoprolol  tartrate 12,5 mg twice daily, Clonidine  (Catapres ) 0.2 mg/24-hour patch  Labs ordered. Discussed with  patient to monitor their blood pressure regularly and maintain a heart-healthy diet rich in fruits, vegetables, whole grains, and low-fat dairy, while reducing sodium intake to less than 2,300 mg per day. Regular physical activity, such as 30 minutes of moderate exercise most days of the week, will help lower blood pressure and improve overall cardiovascular health. Avoiding smoking, limiting alcohol consumption, and managing stress. Take  prescribed medication, & take it as directed and avoid skipping doses. Seek emergency care if your blood pressure is (over 180/100) or you experience chest pain, shortness of breath, or sudden vision changes.Patient verbalizes understanding regarding plan of care and all questions answered.

## 2024-04-14 ENCOUNTER — Other Ambulatory Visit: Payer: Self-pay | Admitting: Family Medicine

## 2024-04-14 ENCOUNTER — Telehealth: Payer: Self-pay

## 2024-04-14 ENCOUNTER — Telehealth: Payer: Self-pay | Admitting: Orthopedic Surgery

## 2024-04-14 ENCOUNTER — Encounter: Payer: Self-pay | Admitting: Family Medicine

## 2024-04-14 LAB — BMP8+EGFR
BUN/Creatinine Ratio: 12 (ref 9–23)
BUN: 8 mg/dL (ref 6–24)
CO2: 22 mmol/L (ref 20–29)
Calcium: 10.4 mg/dL — ABNORMAL HIGH (ref 8.7–10.2)
Chloride: 107 mmol/L — ABNORMAL HIGH (ref 96–106)
Creatinine, Ser: 0.66 mg/dL (ref 0.57–1.00)
Glucose: 126 mg/dL — ABNORMAL HIGH (ref 70–99)
Potassium: 2.7 mmol/L — ABNORMAL LOW (ref 3.5–5.2)
Sodium: 143 mmol/L (ref 134–144)
eGFR: 107 mL/min/{1.73_m2} (ref >59)

## 2024-04-14 MED ORDER — POTASSIUM CHLORIDE CRYS ER 10 MEQ PO TBCR: 10.0000 meq | EXTENDED_RELEASE_TABLET | Freq: Every day | ORAL | 0 refills | Status: DC

## 2024-04-14 NOTE — Telephone Encounter (Signed)
 Returned the pt's call, lvm for her to call back.

## 2024-04-14 NOTE — Telephone Encounter (Signed)
 Copied from CRM [id number]. Topic: Clinical - Prescription Issue >> Apr 14, 2024  8:51 AM Baldemar Lev wrote: Reason for CRM: Ace Holder from [facility] called requesting to discuss clarification needed for some Rxs. Wants to speak to PCP or clinic.   [facility] - [place], [place] - [address] [place] [address] Phone: [phone] Fax: [phone]  Clonidine  patches and tablets are both prescribed, pharmacist needs clarification bc this is atypical.

## 2024-04-16 NOTE — Progress Notes (Unsigned)
 Heidi [name], female    DOB: 04-06-1971    MRN: [id number]   Brief patient profile:  17  yobf  active smoker  referred to pulmonary clinic in [place]  04/17/2024 by Dr Amanda Jungling  for  doe  with mostly restrictive changes on pfts 02/21/24   Seen by [facility] 06/2023 for TME/ ? Etoh w/d related rec clonidine /precedex     History of Present Illness  04/17/2024  Pulmonary/ 1st office eval/ Jerryl Holzhauer / [facility] only on prn saba hfa and neb  Chief Complaint  Patient presents with   Establish Care   Shortness of Breath  Dyspnea:  one aisle  due to back and breathing stopping her about the same time Cough: esp hs dry cough keeps up on couch resting on arm with one pillow x one month Sleep: on couch as above  SABA use: has hfa  2-3 x per day and neb maybe other 02: none     No obvious day to day or daytime pattern/variability or assoc excess/ purulent sputum or mucus plugs or hemoptysis or cp or chest tightness, subjective wheeze or overt sinus or hb symptoms.    Also denies any obvious fluctuation of symptoms with weather or environmental changes or other aggravating or alleviating factors except as outlined above   No unusual exposure hx or h/o childhood pna/ asthma or knowledge of premature birth.  Current Allergies, Complete Past Medical History, Past Surgical History, Family History, and Social History were reviewed in [employer] record.  ROS  The following are not active complaints unless bolded Hoarseness, sore throat, dysphagia, dental problems, itching, sneezing,  nasal congestion or discharge of excess mucus or purulent secretions, ear ache,   fever, chills, sweats, unintended wt loss or wt gain, classically pleuritic or exertional cp,  orthopnea pnd or arm/hand swelling  or leg swelling, presyncope, palpitations, abdominal pain, anorexia, nausea, vomiting, diarrhea  or change in bowel habits or change in bladder habits, change in stools or change in urine,  dysuria, hematuria,  rash, arthralgias, visual complaints, headache, numbness, weakness or ataxia or problems with walking or coordination,  change in mood or  memory.            Outpatient Medications Prior to Visit  Medication Sig Dispense Refill   Accu-Chek Softclix Lancets lancets 100 each by Other route 3 (three) times daily.     albuterol  (PROVENTIL ) (2.5 MG/3ML) 0.083% nebulizer solution Take 3 mLs (2.5 mg total) by nebulization every 6 (six) hours as needed for wheezing or shortness of breath. 150 mL 1   albuterol  (VENTOLIN  HFA) 108 (90 Base) MCG/ACT inhaler Inhale 2 puffs into the lungs every 6 (six) hours as needed for wheezing or shortness of breath. 8 g 2   Blood Glucose Monitoring Suppl DEVI 1 each by Does not apply route in the morning, at noon, and at bedtime. May substitute to any manufacturer covered by patient's insurance. 1 each 0   Blood Pressure Monitor DEVI 1 each by Does not apply route daily. 1 each 0   citalopram (CELEXA) 20 MG tablet Take 20 mg by mouth daily.     cloNIDine  (CATAPRES  - DOSED IN MG/24 HR) 0.2 mg/24hr patch APPLY 1 PATCH TO THE SKIN ONCE WEEKLY. 4 patch 4   cloNIDine  (CATAPRES ) 0.1 MG tablet Take 0.1 mg by mouth daily.     cyclobenzaprine  (FLEXERIL ) 5 MG tablet Take 1 tablet (5 mg total) by mouth 2 (two) times daily as needed for muscle spasms.  60 tablet 2   diltiazem  (CARDIZEM  CD) 120 MG 24 hr capsule Take 1 capsule (120 mg total) by mouth daily. 30 capsule 5   ferrous sulfate 325 (65 FE) MG tablet Take 325 mg by mouth daily with breakfast.     folic acid  (FOLVITE ) 1 MG tablet Take 1 tablet (1 mg total) by mouth daily. 30 tablet 3   furosemide  (LASIX ) 20 MG tablet Take 1 tablet (20 mg total) by mouth daily. 90 tablet 1   gabapentin  (NEURONTIN ) 400 MG capsule TAKE ONE CAPSULE BY MOUTH THREE TIMES DAILY 60 capsule 5   Glucose Blood (BLOOD GLUCOSE TEST STRIPS) STRP 1 each by In Vitro route in the morning, at noon, and at bedtime. May substitute to any  manufacturer covered by patient's insurance. 100 strip 0   Lancet Device MISC 1 each by Does not apply route in the morning, at noon, and at bedtime. May substitute to any manufacturer covered by patient's insurance. 1 each 0   Lancets Misc. MISC 1 each by Does not apply route in the morning, at noon, and at bedtime. May substitute to any manufacturer covered by patient's insurance. 100 each 0   levocetirizine (XYZAL ) 5 MG tablet Take 1 tablet (5 mg total) by mouth every evening. 30 tablet 5   lidocaine  (LIDODERM ) 5 % Place 1 patch onto the skin daily. Remove & Discard patch within 12 hours or as directed by MD 30 patch 2   metFORMIN  (GLUCOPHAGE ) 500 MG tablet Take 1 tablet (500 mg total) by mouth 2 (two) times daily with a meal. 90 tablet 3   metoprolol  tartrate (LOPRESSOR ) 25 MG tablet Take 0.5 tablets (12.5 mg total) by mouth 2 (two) times daily. 60 tablet 5   Misc. Devices (GOJJI WEIGHT SCALE) MISC Daily Weight 1 each 0   naltrexone  (DEPADE) 50 MG tablet Take 1 tablet (50 mg total) by mouth daily. 30 tablet 2   naproxen  (NAPROSYN ) 500 MG tablet Take 500 mg by mouth 2 (two) times daily.     olmesartan  (BENICAR ) 20 MG tablet Take 1 tablet (20 mg total) by mouth daily. 30 tablet 6   Omega-3 Fatty Acids (FISH OIL ) 1000 MG CAPS Take 1 capsule (1,000 mg total) by mouth 2 (two) times daily. 90 capsule 3   ondansetron  (ZOFRAN ) 4 MG tablet Take 1 tablet (4 mg total) by mouth every 8 (eight) hours as needed for nausea or vomiting. 20 tablet 3   pantoprazole  (PROTONIX ) 40 MG tablet Take 1 tablet (40 mg total) by mouth 2 (two) times daily. 60 tablet 11   polyethylene glycol powder (GLYCOLAX /MIRALAX ) 17 GM/SCOOP powder Take 17 g by mouth daily. Mix with 8 oz of water . 510 g 3   potassium chloride  (KLOR-CON ) 10 MEQ tablet Take 10 mEq by mouth daily.     rosuvastatin  (CRESTOR ) 40 MG tablet Take 1 tablet (40 mg total) by mouth daily. 90 tablet 1   traZODone  (DESYREL ) 100 MG tablet Take 200 mg by mouth at  bedtime.     varenicline  (CHANTIX ) 0.5 MG tablet Take 1 tablet (0.5 mg total) by mouth 2 (two) times daily. 60 tablet 0   ALPRAZolam  (XANAX ) 0.25 MG tablet Take 1 tablet (0.25 mg total) by mouth at bedtime as needed for anxiety. (Patient not taking: Reported on 04/04/2024) 30 tablet 0   amoxicillin  (AMOXIL ) 500 MG capsule Take 500 mg by mouth 3 (three) times daily. (Patient not taking: Reported on 04/04/2024)     diazepam  (VALIUM ) 5 MG tablet  Take 5 mg by mouth as needed. (Patient not taking: Reported on 04/04/2024)     ibuprofen  (ADVIL ) 400 MG tablet Take 400 mg by mouth every 6 (six) hours as needed. (Patient not taking: Reported on 04/17/2024)     ibuprofen  (ADVIL ) 800 MG tablet Take 1 tablet (800 mg total) by mouth every 8 (eight) hours as needed. (Patient not taking: Reported on 04/17/2024) 60 tablet 2   nicotine  (NICODERM CQ  - DOSED IN MG/24 HOURS) 14 mg/24hr patch 14 mg daily. (Patient not taking: Reported on 04/17/2024)            potassium chloride  (KLOR-CON  M) 10 MEQ tablet Take 1 tablet (10 mEq total) by mouth daily for 5 days. 5 tablet 0      Past Medical History:  Diagnosis Date   Alcohol withdrawal (HCC)    Anxiety    Cervical radiculopathy    Depression    Diabetes mellitus without complication (HCC)    GERD (gastroesophageal reflux disease)    Hypertension       Objective:     BP 112/68   Pulse (!) 125   Ht 5\' 3"  (1.6 m)   Wt 192 lb (87.1 kg)   SpO2 94%   BMI 34.01 kg/m   SpO2: 94 % RA   Pleasant bf focusing on back symptoms/walks with rollator    HEENT : Oropharynx  clear      Nasal turbinates nl    NECK :  without  apparent JVD/ palpable Nodes/TM    LUNGS: no acc muscle use,  Nl contour chest which is clear to A and P bilaterally without cough on insp or exp maneuvers   CV:  RRR  no s3 or murmur or increase in P2, and no edema   ABD:   marked centripetal obesity but soft and nontender   MS:   ext warm without deformities Or obvious joint restrictions   calf tenderness, cyanosis or clubbing    SKIN: warm and dry without lesions    NEURO:  alert, approp, nl sensorium with  no motor or cerebellar deficits apparent.       I personally reviewed images and agree with radiology impression as follows:  Chest CTa   02/08/24 1. Suboptimal opacification of the pulmonary arterial system. No evidence for acute pulmonary embolus to the lobar level. 2. Emphysema. 3. Trace pericardial effusion.     Assessment   DOE (dyspnea on exertion) Active smoker - PFT's  02/21/24   FEV1 2.02 (74 % ) ratio 0.84  p 2 % improvement from saba p ? Saba  prior to study with DLCO  10.8 (52%)   and FV curve nl  and ERV 19% at wt 194 - 04/17/2024  @ 192  Walked on RA  x  2  lap(s) =  approx 300  ft  @ rollator/mod pace, stopped due to sob  with lowest 02 sats 98%    She may have mild AB but most of her problem is obesity related restriction and deconditioning due to chronic back pain  Rec: For now: Re SABA :  I spent extra time with pt today reviewing appropriate use of albuterol  for prn use on exertion with the following points: 1) saba is for relief of sob that does not improve by walking a slower pace or resting but rather if the pt does not improve after trying this first. 2) If the pt is convinced, as many are, that saba helps recover from activity faster  then it's easy to tell if this is the case by re-challenging : ie stop, take the inhaler, then p 5 minutes try the exact same activity (intensity of workload) that just caused the symptoms and see if they are substantially diminished or not after saba 3) if there is an activity that reproducibly causes the symptoms, try the saba 15 min before the activity on alternate days   If in fact the saba really does help, then fine to continue to use it prn but advised may need to look closer at the maintenance regimen being used to achieve better control of airways disease with exertion.   F/u in 6 weeks with all meds in  hand using a trust but verify approach to confirm accurate Medication  Reconciliation The principal here is that until we are certain that the  patients are doing what we've asked, it makes no sense to ask them to do more.   Cigarette smoker 4-5 min discussion re active cigarette smoking in addition to office E&M  Ask about tobacco use:   ogoing Advise quitting   I took an extended  opportunity with this patient to outline the consequences of continued cigarette use  in airway disorders based on all the data we have from the multiple national lung health studies (perfomed over decades at millions of dollars in cost)  indicating that smoking cessation, not choice of inhalers or pulmonary physicians, is the most important aspect of her  care - based on her recent spirometry it is clearly not too late  Assess willingness:  Not committed at this point Assist in quit attempt:  Per PCP when ready Arrange follow up:   Follow up per Primary Care planned           Vernestine Gondola, MD 04/17/2024

## 2024-04-17 ENCOUNTER — Encounter: Payer: Self-pay | Admitting: Internal Medicine

## 2024-04-17 ENCOUNTER — Ambulatory Visit (INDEPENDENT_AMBULATORY_CARE_PROVIDER_SITE_OTHER): Payer: MEDICAID | Admitting: Internal Medicine

## 2024-04-17 VITALS — BP 112/68 | HR 125 | Ht 63.0 in | Wt 192.0 lb

## 2024-04-17 DIAGNOSIS — F17210 Nicotine dependence, cigarettes, uncomplicated: Secondary | ICD-10-CM

## 2024-04-17 DIAGNOSIS — R0609 Other forms of dyspnea: Secondary | ICD-10-CM | POA: Diagnosis not present

## 2024-04-17 DIAGNOSIS — Z87891 Personal history of nicotine dependence: Secondary | ICD-10-CM | POA: Insufficient documentation

## 2024-04-17 NOTE — Assessment & Plan Note (Addendum)
 Active smoker - PFT's  02/21/24   FEV1 2.02 (74 % ) ratio 0.84  p 2 % improvement from saba p ? Saba  prior to study with DLCO  10.8 (52%)   and FV curve nl  and ERV 19% at wt 194 - 04/17/2024  @ 192  Walked on RA  x  2  lap(s) =  approx 300  ft  @ rollator/mod pace, stopped due to sob  with lowest 02 sats 98%    She may have mild AB but most of her problem is obesity related restriction and deconditioning due to chronic back pain  Rec: For now: Re SABA :  I spent extra time with pt today reviewing appropriate use of albuterol  for prn use on exertion with the following points: 1) saba is for relief of sob that does not improve by walking a slower pace or resting but rather if the pt does not improve after trying this first. 2) If the pt is convinced, as many are, that saba helps recover from activity faster then it's easy to tell if this is the case by re-challenging : ie stop, take the inhaler, then p 5 minutes try the exact same activity (intensity of workload) that just caused the symptoms and see if they are substantially diminished or not after saba 3) if there is an activity that reproducibly causes the symptoms, try the saba 15 min before the activity on alternate days   If in fact the saba really does help, then fine to continue to use it prn but advised may need to look closer at the maintenance regimen being used to achieve better control of airways disease with exertion.   F/u in 6 weeks with all meds in hand using a trust but verify approach to confirm accurate Medication  Reconciliation The principal here is that until we are certain that the  patients are doing what we've asked, it makes no sense to ask them to do more.

## 2024-04-17 NOTE — Patient Instructions (Addendum)
 Pantoprazole  40 mg Take 30- 60 min before your first and last meals of the day   GERD (REFLUX)  is an extremely common cause of respiratory symptoms just like yours , many times with no obvious heartburn at all.    It can be treated with medication, but also with lifestyle changes including elevation of the head of your bed (ideally with 6 -8inch blocks under the headboard of your bed),  Smoking cessation, avoidance of late meals, excessive alcohol, and avoid fatty foods, chocolate, peppermint, colas, red wine, and acidic juices such as orange juice.  NO MINT OR MENTHOL PRODUCTS SO NO COUGH DROPS  USE SUGARLESS CANDY INSTEAD (Jolley ranchers or Stover's or Life Savers) or even ice chips will also do - the key is to swallow to prevent all throat clearing. NO OIL BASED VITAMINS - use powdered substitutes.  Avoid fish oil  when coughing.    Ok to try albuterol  15 min before an activity (on alternating days between the inhaler, then nebulizer and thennothing at all )  that you know would usually make you short of breath and see if it makes any difference and if makes none then don't take albuterol  after activity unless you can't catch your breath as this means it's the resting that helps, not the albuterol .      Schedule PFTS today > late add = error, already done   Please schedule a follow up office visit in 6 weeks, call sooner if needed with all medications /inhalers/ solutions in hand so we can verify exactly what you are taking. This includes all medications from all doctors and over the counters - PLEASE separate them into two bags:  the ones you take automatically, no matter what, vs the ones you take just when you feel you need them "BAG #2 is UP TO YOU"  - this will really help us  help you take your medications more effectively.

## 2024-04-17 NOTE — Telephone Encounter (Signed)
 Patient here checking on forms- wants time extended says 2 hours is not long enough. Please call patient with any information Thank you

## 2024-04-17 NOTE — Assessment & Plan Note (Signed)
 4-5 min discussion re active cigarette smoking in addition to office E&M  Ask about tobacco use:   ogoing Advise quitting   I took an extended  opportunity with this patient to outline the consequences of continued cigarette use  in airway disorders based on all the data we have from the multiple national lung health studies (perfomed over decades at millions of dollars in cost)  indicating that smoking cessation, not choice of inhalers or pulmonary physicians, is the most important aspect of her  care - based on her recent spirometry it is clearly not too late  Assess willingness:  Not committed at this point Assist in quit attempt:  Per PCP when ready Arrange follow up:   Follow up per Primary Care planned

## 2024-04-18 NOTE — Telephone Encounter (Signed)
 Its not based on what they want, the hours they are approved for is based on their in home evaluation. If requesting more hours then it has to be a medical reason that has to be documented on the form in section "change of request- medical"

## 2024-04-19 ENCOUNTER — Other Ambulatory Visit: Payer: Self-pay | Admitting: Family Medicine

## 2024-04-19 ENCOUNTER — Telehealth: Payer: Self-pay

## 2024-04-19 NOTE — Telephone Encounter (Signed)
 Please inform Ace Holder discontinue oral clonidine   pill and continue with clonidine  patches

## 2024-04-19 NOTE — Telephone Encounter (Signed)
 Copied from CRM [id number]. Topic: Clinical - Medical Advice >> Apr 19, 2024  9:56 AM Carlatta H wrote: Reason for CRM: Patient would like a call about having occupational therapy//

## 2024-04-21 ENCOUNTER — Encounter: Payer: Self-pay | Admitting: Orthopedic Surgery

## 2024-04-21 ENCOUNTER — Other Ambulatory Visit: Payer: Self-pay | Admitting: Family Medicine

## 2024-04-21 ENCOUNTER — Other Ambulatory Visit (INDEPENDENT_AMBULATORY_CARE_PROVIDER_SITE_OTHER): Payer: MEDICAID

## 2024-04-21 ENCOUNTER — Ambulatory Visit: Payer: MEDICAID | Admitting: Orthopedic Surgery

## 2024-04-21 DIAGNOSIS — M25552 Pain in left hip: Secondary | ICD-10-CM | POA: Diagnosis not present

## 2024-04-21 DIAGNOSIS — M48062 Spinal stenosis, lumbar region with neurogenic claudication: Secondary | ICD-10-CM

## 2024-04-21 DIAGNOSIS — M51362 Other intervertebral disc degeneration, lumbar region with discogenic back pain and lower extremity pain: Secondary | ICD-10-CM | POA: Diagnosis not present

## 2024-04-21 DIAGNOSIS — Z7689 Persons encountering health services in other specified circumstances: Secondary | ICD-10-CM

## 2024-04-21 MED ORDER — CYCLOBENZAPRINE HCL 10 MG PO TABS: 10.0000 mg | ORAL_TABLET | Freq: Two times a day (BID) | ORAL | 1 refills | Status: DC | PRN

## 2024-04-21 NOTE — Addendum Note (Signed)
 Addended byArla Lab on: 04/21/2024 09:06 AM   Modules accepted: Orders

## 2024-04-21 NOTE — Progress Notes (Signed)
   Chief Complaint  Patient presents with   Back Pain    Encounter Diagnoses  Name Primary?   Spinal stenosis of lumbar region with neurogenic claudication Yes   Degeneration of intervertebral disc of lumbar region with discogenic back pain and lower extremity pain    Pain in left hip     DOI/DOS/ Date: ongoing pain  Worse     Dr Daisey Dryer office notes in referral state Patient called back, patient states that she will call back if she changes her mind, currently scheduled for 03/04/24 with Lateef, (pain management) but if she decides she wants the injection sooner she will call us    Patient states Dr Rhesa Celeste did epidurals but made her back pain worse.

## 2024-04-21 NOTE — Progress Notes (Signed)
 Chief Complaint  Patient presents with   Back Pain    Follow-up visit left hip and back pain history of degenerative disc disease lumbar spine does not have leg pain but does have back pain and tightness in the lower back  I examined her left hip she had normal hip flexion no pain with rotation no pain with flexion  She will had tenderness in the lower back and on the left side of her lower back without radicular symptoms or findings  She is limping quite a bit requiring a walker  I did an x-ray of her hip  DG HIP UNILAT WITH PELVIS 2-3 VIEWS LEFT Result Date: 04/21/2024 Back/hip pain X-ray left hip normal femoral head joint looks normal Head neck offset looks pretty good on the frog lateral I do not really see any abnormalities here Normal x-ray left hip     Recommend we increase her muscle relaxer Continue her gabapentin  Continue the naproxen   Consult with neurosurgery or spine Ortho   Meds ordered this encounter  Medications   cyclobenzaprine  (FLEXERIL ) 10 MG tablet    Sig: Take 1 tablet (10 mg total) by mouth 2 (two) times daily as needed for muscle spasms.    Dispense:  40 tablet    Refill:  1

## 2024-04-21 NOTE — Telephone Encounter (Signed)
 Referral placed.

## 2024-04-21 NOTE — Telephone Encounter (Signed)
 Patient advised.

## 2024-04-21 NOTE — Patient Instructions (Signed)
 We are referring you to [facility] from [facility] [place] address is [address] [name] The phone number is [phone]  The office will call you with an appointment Dr. Sulema Endo is the spine surgeon

## 2024-04-25 NOTE — Telephone Encounter (Signed)
 Called and spoke with requesting office and they have everything they need from us .

## 2024-04-25 NOTE — Telephone Encounter (Signed)
 Pt called in stating she spoke with requesting office today and they have not received clearance. Please advise if it can be sent over again.

## 2024-04-26 ENCOUNTER — Other Ambulatory Visit: Payer: Self-pay | Admitting: Family Medicine

## 2024-04-26 ENCOUNTER — Encounter (HOSPITAL_COMMUNITY): Payer: Self-pay

## 2024-04-26 ENCOUNTER — Telehealth: Payer: Self-pay | Admitting: Family Medicine

## 2024-04-26 ENCOUNTER — Ambulatory Visit (HOSPITAL_COMMUNITY): Payer: MEDICAID | Attending: Family Medicine

## 2024-04-26 DIAGNOSIS — M6281 Muscle weakness (generalized): Secondary | ICD-10-CM | POA: Diagnosis present

## 2024-04-26 DIAGNOSIS — G8929 Other chronic pain: Secondary | ICD-10-CM | POA: Insufficient documentation

## 2024-04-26 DIAGNOSIS — M545 Low back pain, unspecified: Secondary | ICD-10-CM | POA: Diagnosis present

## 2024-04-26 MED ORDER — ACCU-CHEK SOFTCLIX LANCETS MISC: 100.0000 | Freq: Three times a day (TID) | 3 refills | Status: DC

## 2024-04-26 NOTE — Therapy (Signed)
 OUTPATIENT PHYSICAL THERAPY THORACOLUMBAR TREATMENT   Patient Name: Heidi [name] MRN: [id number] DOB:[birth year], 53 y.o., female Today's Date: 04/26/2024  END OF SESSION:  PT End of Session - 04/26/24 0937     Visit Number 4    Number of Visits 8    Date for PT Re-Evaluation 05/26/24    Authorization Type [employer]    Authorization Time Period [employer] approved 7 visits from 03/31/2024-09/27/2024    Authorization - Visit Number 3    Authorization - Number of Visits 7    Progress Note Due on Visit 8    PT Start Time 0932    PT Stop Time 1013    PT Time Calculation (min) 41 min    Activity Tolerance Patient tolerated treatment well    Behavior During Therapy [facility] for tasks assessed/performed               Past Medical History:  Diagnosis Date   Alcohol withdrawal (HCC)    Anxiety    Cervical radiculopathy    Depression    Diabetes mellitus without complication (HCC)    GERD (gastroesophageal reflux disease)    Hypertension    Past Surgical History:  Procedure Laterality Date   BIOPSY  11/30/2023   Procedure: BIOPSY;  Surgeon: Vinetta Greening, DO;  [facility]: [facility];  Service: Endoscopy;;   BRAIN SURGERY     COLONOSCOPY WITH PROPOFOL  N/A 11/30/2023   Surgeon: Goble Last K, DO;   5 mm polyp resected and retrieved, stool throughout the entire colon.  Pathology with tubular adenoma.  Recommended 5-year surveillance.   ESOPHAGOGASTRODUODENOSCOPY (EGD) WITH PROPOFOL  N/A 11/30/2023   Surgeon: Vinetta Greening, DO;  Small hiatal hernia, gastritis biopsied, duodenitis.  Pathology with reactive gastropathy and focal erosion, negative for H. pylori. Recommended PPI BID.   FLEXIBLE SIGMOIDOSCOPY N/A 07/13/2023   Procedure: FLEXIBLE SIGMOIDOSCOPY;  Surgeon: Urban Garden, MD;  [facility]: [facility];  Service: Gastroenterology;  Laterality: N/A;   POLYPECTOMY  11/30/2023   Procedure: POLYPECTOMY;  Surgeon: Vinetta Greening, DO;  Location: [facility];  Service: Endoscopy;;   Patient Active Problem List   Diagnosis Date Noted   DOE (dyspnea on exertion) 04/17/2024   Cigarette smoker 04/17/2024   Chest pain 02/08/2024   Lumbar facet arthropathy 12/28/2023   Chronic pain syndrome 12/28/2023   Cervical cancer screening 10/26/2023   Degenerative disc disease, lumbar 08/11/2023   Hospital discharge follow-up 07/29/2023   Laceration of spleen 07/24/2023   Ileus (HCC) 07/13/2023   Elevated LFTs 07/12/2023   Colonic obstruction (HCC) 07/12/2023   Hypothermia 07/09/2023   Hypokalemia 07/06/2023   Thrombocytopenia (HCC) 07/06/2023   Chronic back pain 05/18/2023   Bilateral knee pain 04/26/2023   Depression, recurrent (HCC) 04/26/2023   Alcohol use disorder 04/20/2023   Carpal tunnel syndrome of right wrist 04/20/2023   Tachycardia 04/20/2023   Transaminitis 04/06/2023   GERD (gastroesophageal reflux disease) 04/06/2023   Tobacco use disorder 04/06/2023   Alcohol withdrawal (HCC) 10/19/2022   Essential hypertension 10/19/2022   Nausea & vomiting 10/19/2022   Hypomagnesemia 10/19/2022   Cervical radicular pain 10/29/2010    PCP: Rosanna Comment, FNP  REFERRING PROVIDER: Rosanna Comment, FNP  REFERRING DIAG: M54.50 (ICD-10-CM) - Lumbar pain   Rationale for Evaluation and Treatment: Rehabilitation  THERAPY DIAG:  Muscle weakness (generalized)  Low back pain, unspecified back pain laterality, unspecified chronicity, unspecified whether sciatica present  Chronic bilateral low back pain without  sciatica  ONSET DATE: "numerous years"  SUBJECTIVE:                                                                                                                                                                                           SUBJECTIVE STATEMENT: 04/26/24:  Reports dull achy pain in lower back, pain scale 7/10.  Feels really stiff today.  Reports she was diagnosed with diabetes 2-3 weeks ago,  Metformin  new medication.  Blood sugar was 209 last night.    Eval:  Pt reporting she was coming here earlier last year but was told by upfront staff to, "not come back until after December." Pt reporting constant back pain that has been severing limiting and is always in pain.   PERTINENT HISTORY:  Hx of knee pain, cervical pain, and see some bilateral UE numbness  PAIN:  Are you having pain? Yes: NPRS scale: 7/10 Pain location: low back/R hip Pain description: achy/dull Aggravating factors: walking/standing Relieving factors: resting/laying down  PRECAUTIONS: None  RED FLAGS: None   WEIGHT BEARING RESTRICTIONS: No  FALLS:  Has patient fallen in last 6 months? No   PATIENT GOALS: "get out of pain"  NEXT MD VISIT:   OBJECTIVE:  Note: Objective measures were completed at Evaluation unless otherwise noted.  DIAGNOSTIC FINDINGS:  MRI:L3-4 there is a mild disc bulge flattening the ventral thecal sac and small right foraminal disc extrusion. Moderate-severe bilateral facet arthropathy with small facet effusions. Moderate central canal stenosis. Severe right foraminal stenosis. Moderate left foraminal stenosis.  PATIENT SURVEYS:  Modified Oswestry 38/50 = 76%   COGNITION: Overall cognitive status: Within functional limits for tasks assessed     SENSATION: WFL  POSTURE: rounded shoulders, forward head, increased lumbar lordosis, and anterior pelvic tilt  PALPATION: Tender to touch bilateral lumbar paraspinals  LUMBAR ROM:   AROM eval 04/06/24  Flexion 90% and painful   Extension 100% and painful   Right lateral flexion  100% c/o tightness on Lt QL region, hand past knee  Left lateral flexion  100% painfree with hand past knee  Right rotation  50% painful center of  and c/o tightness  Left rotation  50% painful and c/o tightness   (Blank rows = not tested)  LOWER EXTREMITY ROM:     Active  Right eval Left eval  Hip flexion    Hip extension    Hip  abduction    Hip adduction    Hip internal rotation    Hip external rotation    Knee flexion    Knee extension    Ankle dorsiflexion    Ankle plantarflexion  Ankle inversion    Ankle eversion     (Blank rows = not tested)  LOWER EXTREMITY MMT:    MMT Right eval Left eval  Hip flexion 4- 4-  Hip extension 3+ 4-  Hip abduction 3+ 4-  Hip adduction    Hip internal rotation    Hip external rotation    Knee flexion    Knee extension    Ankle dorsiflexion    Ankle plantarflexion    Ankle inversion    Ankle eversion     (Blank rows = not tested)  FUNCTIONAL TESTS:  30 Second Chair Stand Test: 10x Norms:   Age 35-64 74-69 39-74 72-79 43-84 85-89 90-94  Women 15 15 14 13 12 11 9   Men 17 16 15 14 13 11 9   2  minute walk test: 254ft  GAIT: Distance walked: > 34ft with total ambulation throughout session Assistive device utilized: None Level of assistance: Complete Independence Comments: externally rotated BLE during swing and stance phase with antalgia via RLE  TREATMENT DATE:  04/26/24: Avanell Leigh UE/LE 5' [place] trail Supine: Reviewed log rolling - LTR 5x 10" - SKTC  2x 30" - Pubic clearning (isometric add ball squeeze to abd into belt Seated:   - STS 2 sets 5 reps (SOB following 5 reps)  O2 sat at 97%  Standing:  RTB shoulder extension 2x 5 reps   04/11/2024  -Standing iliopsoas stretch with neuro-muscular re-ed tactile cues at glutes 3 x 30'' -7in eccentric step down with BUE support- cues for hip hinge to facilitate glute activation x 8 bilaterally- terminated early to OOB- HR: 120 bpm  SpO2: 94%   -Supine bridge with RTB at knees x 15 w/ 3'' isometric  88% SpO2 reading in supine -R side clamshells with RTB around knees x 15 w/ 3'' hold -Seated palloff press with RTBx x12 bilaterally with cues for TA bracing -Seated marching with BUE shoulder extension with RTB 2 x 30'' -Supine piriformis stretch 2x30''  04/06/24: Reviewed goals ROM assessment -see  above  Supine:  Instructed log rolling Posterior pelvic tilt 10x 3" TA bracing 10x 5" Decompression 2-5 5x 5" Bridge 10x 5" LTR 5x 10"  Sidelying 10x 5" BLE with RTB around thigh  Seated: STS 10x no HHA eccentric control  03/31/2024 PT Evaluation TA bracing x 10 seconds         Supine bridge x 10 for 3 hold 1x PPT Clamshell x 10 on L glutea                                                                                                                          PATIENT EDUCATION:  Education details: PT Evaluation, findings, prognosis, frequency, attendance policy, and HEP. Person educated: Patient Education method: Medical illustrator Education comprehension: verbalized understanding  HOME EXERCISE PROGRAM: Access Code: [email or web address] URL: [email or web address] Date: 03/31/2024 Prepared by: Irene Mannheim  Exercises - Supine Transversus Abdominis Bracing - Hands on Stomach  - 1  x daily - 7 x weekly - 3 sets - 10 reps - Supine Bridge  - 1 x daily - 7 x weekly - 3 sets - 10 reps - 5 hold - Supine Posterior Pelvic Tilt  - 1 x daily - 7 x weekly - 3 sets - 10 reps - 10 hold - Clamshell  - 1 x daily - 7 x weekly - 3 sets - 10 reps - 5 hold - Supine Piriformis Stretch with Foot on Ground  - 1 x daily - 7 x weekly - 3 sets - 3 reps - 30 hold  04/06/24:  Decompression  04/26/24- Hooklying Isometric Hip Abduction with Belt  - 2 x daily - 7 x weekly - 1 sets - 10 reps - 5" hold Walking program   ASSESSMENT:  CLINICAL IMPRESSION: Began session on Nustep for dynamic warm up to address c/o stiffness.  Reviewed log rolling to reduce stress on lower back and increase ease with bed mobility.  Therex focus with core/proximal strengthening and stretches for mobility.  Pt c/o SI pain, added pubic clearing with reports of pain reduced to 5/10.  Pt easily SOB with sit to stand, O2 saturation at 97%.  Began theraband for postural strengthening with verbal and tactile cueing  for proper form and mechanics, pt easily fatigued, required seated rest break following 5 reps.  Encouraged to increase compliance with HEP and begin walking program to improve activity tolerance with verbalized understanding for maximal benefits.    Eval:  Patient is a 53 y.o. female who was seen today for physical therapy evaluation and treatment for M54.50 (ICD-10-CM) - Lumbar pain. Pt reports severity of pain and symptoms are rated as severe but objective measures do not show same level of severity. Pt with chronic back pain in nature, pt also with poor attendance in previous PT POC. Pt is demonstrating functional limitations, reduced capacity for ambulation and functional mobility due to muscle weakness, ROM deficits, chronic pain and poor lumbar posture. Pt will benefit from skilled Physical Therapy services to address deficits/limitations in order to improve functional and QOL.    OBJECTIVE IMPAIRMENTS: decreased activity tolerance, decreased balance, decreased mobility, decreased ROM, decreased strength, hypomobility, postural dysfunction, and pain.   ACTIVITY LIMITATIONS: carrying, lifting, bending, sitting, standing, squatting, sleeping, stairs, transfers, bed mobility, toileting, dressing, reach over head, hygiene/grooming, and locomotion level  PARTICIPATION LIMITATIONS: meal prep, cleaning, laundry, driving, shopping, community activity, occupation, and yard work  PERSONAL FACTORS: Age and Behavior pattern are also affecting patient's functional outcome.   REHAB POTENTIAL: Fair poor attendace  CLINICAL DECISION MAKING: Stable/uncomplicated  EVALUATION COMPLEXITY: Low   GOALS: Goals reviewed with patient? No  SHORT TERM GOALS: Target date: 04/28/24  Pt will be independent with HEP in order to demonstrate participation in Physical Therapy POC.  Baseline: Goal status: INITIAL  2.  Pt will report 4/10 pain with mobility in order to demonstrate improved pain with ADLs.  Baseline:   Goal status: INITIAL  LONG TERM GOALS: Target date: 05/26/2024  Pt will improve 30 Second Chair Stand test by at least 5 in order to demonstrate improved functional strength to return to desired activities.  Baseline: see objective.  Goal status: INITIAL  2.  Pt will improve 2 MWT by at least 111ft in order to demonstrate improved functional ambulatory capacity in community setting.  Baseline: see objective.  Goal status: INITIAL  3.  Pt will improve Modified Oswestry score by 15 points in order to demonstrate improved pain with functional  goals and outcomes. Baseline: see objective.  Goal status: INITIAL  4.  Pt will report 3/10 pain with mobility in order to demonstrate reduced pain with ADLs lasting greater than 30 minutes.  Baseline: see objective.  Goal status: INITIAL  PLAN:  PT FREQUENCY: 1x/week  PT DURATION: 8 weeks *scheduling one visit at a time*  PLANNED INTERVENTIONS: 97164- PT Re-evaluation, 97750- Physical Performance Testing, 97110-Therapeutic exercises, 97530- Therapeutic activity, [id number]- Neuromuscular re-education, 97535- Self Care, 16109- Manual therapy, [phone]- Gait training, [phone]- Electrical stimulation (unattended), [phone]- Electrical stimulation (manual), Patient/Family education, Balance training, Stair training, Taping, Dry Needling, Joint mobilization, Joint manipulation, Spinal manipulation, Spinal mobilization, Cryotherapy, and Moist heat.  PLAN FOR NEXT SESSION: one visit at a time; gluteal weakness, posture,posture, posture  Minor Amble, LPTA/CLT; CBIS [phone]   Alesia Anchors, PTA 04/26/2024, 11:36 AM

## 2024-04-26 NOTE — Telephone Encounter (Signed)
 Last Fill: 03/22/24  Last OV: 04/13/24 Next OV: 06/16/24  Routing to provider for review/authorization.

## 2024-04-26 NOTE — Telephone Encounter (Signed)
 Copied from CRM [id number]. Topic: General - Other >> Apr 26, 2024 12:35 PM Carla L wrote: Reason for CRM: Patient states theres a question that needs to be checked yes for paperwork for her CAP program.   "Does she meet criteria to be in a rest home?" Patient states that should be checked yes. Patient states she was only approved for 2 hours but that would not be enough time to assist her.   Please assist patient further

## 2024-04-26 NOTE — Telephone Encounter (Signed)
 Contacted Brandon back , gave orders for discontinuation of pill and continuation of patch.

## 2024-04-26 NOTE — Telephone Encounter (Signed)
 Copied from CRM [id number]. Topic: Referral - Status >> Apr 26, 2024 12:39 PM Ethelle Herb L wrote: Reason for CRM: Patient states she has not heard back from eye doctor. Patient states she was referred to ophthalmologist.  Requesting call back w/ information to office, [phone]

## 2024-04-26 NOTE — Telephone Encounter (Signed)
 Copied from CRM [id number]. Topic: Clinical - Medication Refill >> Apr 26, 2024 12:32 PM Ethelle Herb L wrote: Medication: accu-chek softclix test strips  Has the patient contacted their pharmacy? Yes Told to contact the office.   This is the patient's preferred pharmacy:  [facility] - [place], [place] - [address] [place] [address] Phone: [phone] Fax: [phone]  Is this the correct pharmacy for this prescription? Yes If no, delete pharmacy and type the correct one.   Has the prescription been filled recently? No  Is the patient out of the medication? Yes  Has the patient been seen for an appointment in the last year OR does the patient have an upcoming appointment? Yes  Can we respond through MyChart? Yes  Agent: Please be advised that Rx refills may take up to 3 business days. We ask that you follow-up with your pharmacy.

## 2024-04-27 NOTE — Telephone Encounter (Signed)
 Reached out to the referral team to see where the referral was sent and will have the pt reach out to see if they will go ahead and give her an appt

## 2024-05-03 ENCOUNTER — Telehealth: Payer: Self-pay

## 2024-05-03 NOTE — Telephone Encounter (Signed)
Which form

## 2024-05-03 NOTE — Telephone Encounter (Signed)
 Copied from CRM [id number]. Topic: General - Other >> May 03, 2024  9:48 AM Hobson Luna F wrote: Reason for CRM: Lonni Robert, a nurse with [facility] is calling in requesting to speak with [facility] regarding a form. Please follow up with Lonni Robert. [phone] ext [phone]

## 2024-05-04 ENCOUNTER — Other Ambulatory Visit: Payer: Self-pay | Admitting: Orthopedic Surgery

## 2024-05-08 ENCOUNTER — Telehealth: Payer: Self-pay | Admitting: Family Medicine

## 2024-05-08 NOTE — Telephone Encounter (Signed)
 Payton  RN Care Manager from [facility] phone - [phone] ext [phone]   Caller wanted to give patient PCP a heads up on the below:   Patient aid will bring the CAP form for PCP to fill out and the reason services was denied in [place] is because this question below was answered "No."  Form was mailed to patient on 05/05/2024. The turn around time is 30 days form can be emailed to [email or web address] or can be faxed to the # listed on the CAP form.   CAP form page 3 in bold - In your clinical judment are the health care conditioms "diagnosis medication specialize treatment in medication regimen chronic and severe enough to met an institutional level of care."  Caller states please check "yes" to this question.    2nd form is from [employer] to increase personal care home service due to patient back pain worsening and patiet requesting additional care and hours. Caller will fax form to [phone]. Please fax back to [phone]. Please fill out questions 1,2 and 3.   Caller states if you have any questions regarding the above please call.

## 2024-05-08 NOTE — Telephone Encounter (Signed)
 I have not received a CAP form

## 2024-05-08 NOTE — Telephone Encounter (Signed)
 PCS / MEDICAL NEEDS   Noted  Copied Sleeved  Original in PCP box Copy front desk folder

## 2024-05-09 ENCOUNTER — Telehealth: Payer: Self-pay | Admitting: Orthopedic Surgery

## 2024-05-09 ENCOUNTER — Ambulatory Visit (HOSPITAL_COMMUNITY): Payer: MEDICAID

## 2024-05-09 ENCOUNTER — Encounter: Payer: Self-pay | Admitting: Gastroenterology

## 2024-05-09 DIAGNOSIS — M6281 Muscle weakness (generalized): Secondary | ICD-10-CM | POA: Diagnosis not present

## 2024-05-09 DIAGNOSIS — M545 Low back pain, unspecified: Secondary | ICD-10-CM

## 2024-05-09 DIAGNOSIS — G8929 Other chronic pain: Secondary | ICD-10-CM

## 2024-05-09 NOTE — Therapy (Addendum)
 OUTPATIENT PHYSICAL THERAPY THORACOLUMBAR TREATMENT   Patient Name: DORIE OHMS MRN: [id number] DOB:1971/12/14, 53 y.o., female Today's Date: 05/09/2024  END OF SESSION:  PT End of Session - 05/09/24 1300     Visit Number 5    Number of Visits 8    Date for PT Re-Evaluation 05/26/24    Authorization Type [employer]    Authorization Time Period [employer] approved 7 visits from 03/31/2024-09/27/2024    Authorization - Visit Number 4    Authorization - Number of Visits 7    Progress Note Due on Visit 8    PT Start Time 1300    PT Stop Time 1340    PT Time Calculation (min) 40 min    Activity Tolerance Patient tolerated treatment well    Behavior During Therapy [facility] for tasks assessed/performed               Past Medical History:  Diagnosis Date   Alcohol withdrawal (HCC)    Anxiety    Cervical radiculopathy    Depression    Diabetes mellitus without complication (HCC)    GERD (gastroesophageal reflux disease)    Hypertension    Past Surgical History:  Procedure Laterality Date   BIOPSY  11/30/2023   Procedure: BIOPSY;  Surgeon: Vinetta Greening, DO;  Location: [facility];  Service: Endoscopy;;   BRAIN SURGERY     COLONOSCOPY WITH PROPOFOL  N/A 11/30/2023   Surgeon: Goble Last K, DO;   5 mm polyp resected and retrieved, stool throughout the entire colon.  Pathology with tubular adenoma.  Recommended 5-year surveillance.   ESOPHAGOGASTRODUODENOSCOPY (EGD) WITH PROPOFOL  N/A 11/30/2023   Surgeon: Vinetta Greening, DO;  Small hiatal hernia, gastritis biopsied, duodenitis.  Pathology with reactive gastropathy and focal erosion, negative for H. pylori. Recommended PPI BID.   FLEXIBLE SIGMOIDOSCOPY N/A 07/13/2023   Procedure: FLEXIBLE SIGMOIDOSCOPY;  Surgeon: Urban Garden, MD;  Location: [facility];  Service: Gastroenterology;  Laterality: N/A;   POLYPECTOMY  11/30/2023   Procedure: POLYPECTOMY;  Surgeon: Vinetta Greening, DO;  Location: [facility];  Service: Endoscopy;;   Patient Active Problem List   Diagnosis Date Noted   DOE (dyspnea on exertion) 04/17/2024   Cigarette smoker 04/17/2024   Chest pain 02/08/2024   Lumbar facet arthropathy 12/28/2023   Chronic pain syndrome 12/28/2023   Cervical cancer screening 10/26/2023   Degenerative disc disease, lumbar 08/11/2023   Hospital discharge follow-up 07/29/2023   Laceration of spleen 07/24/2023   Ileus (HCC) 07/13/2023   Elevated LFTs 07/12/2023   Colonic obstruction (HCC) 07/12/2023   Hypothermia 07/09/2023   Hypokalemia 07/06/2023   Thrombocytopenia (HCC) 07/06/2023   Chronic back pain 05/18/2023   Bilateral knee pain 04/26/2023   Depression, recurrent (HCC) 04/26/2023   Alcohol use disorder 04/20/2023   Carpal tunnel syndrome of right wrist 04/20/2023   Tachycardia 04/20/2023   Transaminitis 04/06/2023   GERD (gastroesophageal reflux disease) 04/06/2023   Tobacco use disorder 04/06/2023   Alcohol withdrawal (HCC) 10/19/2022   Essential hypertension 10/19/2022   Nausea & vomiting 10/19/2022   Hypomagnesemia 10/19/2022   Cervical radicular pain 10/29/2010    PCP: Rosanna Comment, FNP  REFERRING PROVIDER: Rosanna Comment, FNP  REFERRING DIAG: M54.50 (ICD-10-CM) - Lumbar pain   Rationale for Evaluation and Treatment: Rehabilitation  THERAPY DIAG:  Muscle weakness (generalized)  Low back pain, unspecified back pain laterality, unspecified chronicity, unspecified whether sciatica present  Chronic bilateral low back pain without  sciatica  ONSET DATE: "numerous years"  SUBJECTIVE:                                                                                                                                                                                           SUBJECTIVE STATEMENT: Back pain is a little worse; 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  Usually checks her blood sugar 1 time a week.Taking prednisone .    Eval:  Pt reporting she was coming here earlier last year but was told by upfront staff to, "not come back until after December." Pt reporting constant back pain that has been severing limiting and is always in pain.   PERTINENT HISTORY:  Hx of knee pain, cervical pain, and see some bilateral UE numbness  PAIN:  Are you having pain? Yes: NPRS scale: 7/10 Pain location: low back/R hip Pain description: achy/dull Aggravating factors: walking/standing Relieving factors: resting/laying down  PRECAUTIONS: None  RED FLAGS: None   WEIGHT BEARING RESTRICTIONS: No  FALLS:  Has patient fallen in last 6 months? No   PATIENT GOALS: "get out of pain"  NEXT MD VISIT:   OBJECTIVE:  Note: Objective measures were completed at Evaluation unless otherwise noted.  DIAGNOSTIC FINDINGS:  MRI:L3-4 there is a mild disc bulge flattening the ventral thecal sac and small right foraminal disc extrusion. Moderate-severe bilateral facet arthropathy with small facet effusions. Moderate central canal stenosis. Severe right foraminal stenosis. Moderate left foraminal stenosis.  PATIENT SURVEYS:  Modified Oswestry 38/50 = 76%   COGNITION: Overall cognitive status: Within functional limits for tasks assessed     SENSATION: WFL  POSTURE: rounded shoulders, forward head, increased lumbar lordosis, and anterior pelvic tilt  PALPATION: Tender to touch bilateral lumbar paraspinals  LUMBAR ROM:   AROM eval 04/06/24  Flexion 90% and painful   Extension 100% and painful   Right lateral flexion  100% c/o tightness on Lt QL region, hand past knee  Left lateral flexion  100% painfree with hand past knee  Right rotation  50% painful center of  and c/o tightness  Left rotation  50% painful and c/o tightness   (Blank rows = not tested)  LOWER EXTREMITY ROM:     Active  Right eval Left eval  Hip flexion    Hip extension     Hip abduction    Hip adduction    Hip internal rotation    Hip external rotation    Knee flexion    Knee extension    Ankle  dorsiflexion    Ankle plantarflexion    Ankle inversion    Ankle eversion     (Blank rows = not tested)  LOWER EXTREMITY MMT:    MMT Right eval Left eval  Hip flexion 4- 4-  Hip extension 3+ 4-  Hip abduction 3+ 4-  Hip adduction    Hip internal rotation    Hip external rotation    Knee flexion    Knee extension    Ankle dorsiflexion    Ankle plantarflexion    Ankle inversion    Ankle eversion     (Blank rows = not tested)  FUNCTIONAL TESTS:  30 Second Chair Stand Test: 10x Norms:   Age 44-64 73-69 62-74 70-79 10-84 50-89 61-94  Women 15 15 14 13 12 11 9   Men 17 16 15 14 13 11 9   2  minute walk test: 231ft  GAIT: Distance walked: > 342ft with total ambulation throughout session Assistive device utilized: None Level of assistance: Complete Independence Comments: externally rotated BLE during swing and stance phase with antalgia via RLE  TREATMENT DATE:  05/09/24 Nustep seat 9 UE and LE x 5' level 2 dynamic warm up Supine: Decompression position with moist heat to low back with instruction in breathing exercise for relaxation x 5' Abdominal bracing 5" hold x 10 Scapular retraction 5" hold x 10 SKTC 3 x 20" each O2 sat in supine 89% O2 sat in sitting 97% Standing: Calf raises x 10 Calf stretch x 20" Updated HEP   04/26/24: - Nustep UE/LE 5' [place] trail Supine: Reviewed log rolling - LTR 5x 10" - SKTC  2x 30" - Pubic clearning (isometric add ball squeeze to abd into belt Seated:   - STS 2 sets 5 reps (SOB following 5 reps)  O2 sat at 97%  Standing:  RTB shoulder extension 2x 5 reps   04/11/2024  -Standing iliopsoas stretch with neuro-muscular re-ed tactile cues at glutes 3 x 30'' -7in eccentric step down with BUE support- cues for hip hinge to facilitate glute activation x 8 bilaterally- terminated early to OOB- HR:  120 bpm  SpO2: 94%   -Supine bridge with RTB at knees x 15 w/ 3'' isometric  88% SpO2 reading in supine -R side clamshells with RTB around knees x 15 w/ 3'' hold -Seated palloff press with RTBx x12 bilaterally with cues for TA bracing -Seated marching with BUE shoulder extension with RTB 2 x 30'' -Supine piriformis stretch 2x30''  04/06/24: Reviewed goals ROM assessment -see above  Supine:  Instructed log rolling Posterior pelvic tilt 10x 3" TA bracing 10x 5" Decompression 2-5 5x 5" Bridge 10x 5" LTR 5x 10"  Sidelying 10x 5" BLE with RTB around thigh  Seated: STS 10x no HHA eccentric control  03/31/2024 PT Evaluation TA bracing x 10 seconds         Supine bridge x 10 for 3 hold 1x PPT Clamshell x 10 on L glutea  PATIENT EDUCATION:  Education details: PT Evaluation, findings, prognosis, frequency, attendance policy, and HEP. Person educated: Patient Education method: Medical illustrator Education comprehension: verbalized understanding  HOME EXERCISE PROGRAM: Access Code: [email or web address] URL: [email or web address] Date: 05/09/2024 Prepared by: [facility]  Exercises - Supine Scapular Retraction  - 2 x daily - 7 x weekly - 1 sets - 10 reps - 5 sec hold - Heel Raises with Counter Support  - 2 x daily - 7 x weekly - 1 sets - 10 reps - Gastroc Stretch on Wall  - 2 x daily - 7 x weekly - 1 sets - 3 reps - 20 sec hold Access Code: [email or web address] URL: [email or web address] Date: 03/31/2024 Prepared by: Irene Mannheim  Exercises - Supine Transversus Abdominis Bracing - Hands on Stomach  - 1 x daily - 7 x weekly - 3 sets - 10 reps - Supine Bridge  - 1 x daily - 7 x weekly - 3 sets - 10 reps - 5 hold - Supine Posterior Pelvic Tilt  - 1 x daily - 7 x weekly - 3 sets - 10 reps - 10 hold - Clamshell  - 1 x daily - 7 x weekly - 3  sets - 10 reps - 5 hold - Supine Piriformis Stretch with Foot on Ground  - 1 x daily - 7 x weekly - 3 sets - 3 reps - 30 hold  04/06/24:  Decompression  04/26/24- Hooklying Isometric Hip Abduction with Belt  - 2 x daily - 7 x weekly - 1 sets - 10 reps - 5" hold Walking program   ASSESSMENT:  CLINICAL IMPRESSION: Today's session started with warm up on Nustep.  Patient with reports of increased pain today so then trial of getting into a decompressed position with moist heat with relaxation techniques; deep breathing to decrease pain.  Continued with postural strengthening;focus on engaging core and proper breathing with exercise; needs cues for breathing frequently as she tends to hold her breath.  Patient O2 sat at 89% in supine; appears that patient could possibly be holding fluid in her abdomen? Improves to 97% when upright.  Updated HEP. Decreased pain levels to 3/10 at the end of treatment today. Patient will benefit from continued skilled therapy services to address deficits and promote return to optimal function.       Eval:  Patient is a 53 y.o. female who was seen today for physical therapy evaluation and treatment for M54.50 (ICD-10-CM) - Lumbar pain. Pt reports severity of pain and symptoms are rated as severe but objective measures do not show same level of severity. Pt with chronic back pain in nature, pt also with poor attendance in previous PT POC. Pt is demonstrating functional limitations, reduced capacity for ambulation and functional mobility due to muscle weakness, ROM deficits, chronic pain and poor lumbar posture. Pt will benefit from skilled Physical Therapy services to address deficits/limitations in order to improve functional and QOL.    OBJECTIVE IMPAIRMENTS: decreased activity tolerance, decreased balance, decreased mobility, decreased ROM, decreased strength, hypomobility, postural dysfunction, and pain.   ACTIVITY LIMITATIONS: carrying, lifting, bending, sitting,  standing, squatting, sleeping, stairs, transfers, bed mobility, toileting, dressing, reach over head, hygiene/grooming, and locomotion level  PARTICIPATION LIMITATIONS: meal prep, cleaning, laundry, driving, shopping, community activity, occupation, and yard work  PERSONAL FACTORS: Age and Behavior pattern are also affecting patient's functional outcome.   REHAB POTENTIAL: Fair poor attendace  CLINICAL DECISION MAKING: Stable/uncomplicated  EVALUATION COMPLEXITY: Low   GOALS: Goals reviewed with patient?  No  SHORT TERM GOALS: Target date: 04/28/24  Pt will be independent with HEP in order to demonstrate participation in Physical Therapy POC.  Baseline: Goal status: INITIAL  2.  Pt will report 4/10 pain with mobility in order to demonstrate improved pain with ADLs.  Baseline:  Goal status: INITIAL  LONG TERM GOALS: Target date: 05/26/2024  Pt will improve 30 Second Chair Stand test by at least 5 in order to demonstrate improved functional strength to return to desired activities.  Baseline: see objective.  Goal status: INITIAL  2.  Pt will improve 2 MWT by at least 120ft in order to demonstrate improved functional ambulatory capacity in community setting.  Baseline: see objective.  Goal status: INITIAL  3.  Pt will improve Modified Oswestry score by 15 points in order to demonstrate improved pain with functional goals and outcomes. Baseline: see objective.  Goal status: INITIAL  4.  Pt will report 3/10 pain with mobility in order to demonstrate reduced pain with ADLs lasting greater than 30 minutes.  Baseline: see objective.  Goal status: INITIAL  PLAN:  PT FREQUENCY: 1x/week  PT DURATION: 8 weeks *scheduling one visit at a time*  PLANNED INTERVENTIONS: 97164- PT Re-evaluation, 97750- Physical Performance Testing, 97110-Therapeutic exercises, 97530- Therapeutic activity, [id number]- Neuromuscular re-education, 97535- Self Care, 82956- Manual therapy, [phone]- Gait training,  [phone]- Electrical stimulation (unattended), [phone]- Electrical stimulation (manual), Patient/Family education, Balance training, Stair training, Taping, Dry Needling, Joint mobilization, Joint manipulation, Spinal manipulation, Spinal mobilization, Cryotherapy, and Moist heat.  PLAN FOR NEXT SESSION: one visit at a time; gluteal weakness, posture,posture, posture  1:46 PM, 05/09/24 Marilynne Dupuis Small Jaymee Tilson MPT [facility] [place] [phone]

## 2024-05-09 NOTE — Telephone Encounter (Signed)
 Dr. Delfino Fellers pt - pt lvm requesting another referral to Dr. Sulema Endo.  [phone]

## 2024-05-09 NOTE — Telephone Encounter (Signed)
 Called pt stated she is needing a referral to Dr. Sulema Endo so they can see her for the lumbar spine, I informed the pt someone from Dr. Sulema Endo had left a mychart message for her to call the office and schedule appt. Pt will check her message and call.

## 2024-05-09 NOTE — Telephone Encounter (Signed)
 [employer] form completed and put in provider box to sign. Will fax back once complete and make pt aware

## 2024-05-10 ENCOUNTER — Ambulatory Visit: Payer: MEDICAID | Admitting: Student in an Organized Health Care Education/Training Program

## 2024-05-11 ENCOUNTER — Other Ambulatory Visit: Payer: Self-pay | Admitting: Family Medicine

## 2024-05-11 DIAGNOSIS — M48062 Spinal stenosis, lumbar region with neurogenic claudication: Secondary | ICD-10-CM | POA: Insufficient documentation

## 2024-05-12 NOTE — Telephone Encounter (Signed)
 Faxed to [phone] and [phone] with confirmation.

## 2024-05-12 NOTE — Telephone Encounter (Signed)
 Encounter made in error.

## 2024-05-16 ENCOUNTER — Other Ambulatory Visit: Payer: Self-pay | Admitting: Family Medicine

## 2024-05-17 ENCOUNTER — Other Ambulatory Visit: Payer: Self-pay | Admitting: Family Medicine

## 2024-05-17 ENCOUNTER — Ambulatory Visit (HOSPITAL_COMMUNITY): Payer: MEDICAID | Admitting: Physical Therapy

## 2024-05-17 ENCOUNTER — Telehealth: Payer: Self-pay | Admitting: Family Medicine

## 2024-05-17 DIAGNOSIS — M6281 Muscle weakness (generalized): Secondary | ICD-10-CM | POA: Diagnosis not present

## 2024-05-17 DIAGNOSIS — G8929 Other chronic pain: Secondary | ICD-10-CM

## 2024-05-17 DIAGNOSIS — M545 Low back pain, unspecified: Secondary | ICD-10-CM

## 2024-05-17 NOTE — Telephone Encounter (Signed)
 Please also email the forms to [email or web address]. Pt is needing this done asap before she is terminated from this application.

## 2024-05-17 NOTE — Therapy (Signed)
 OUTPATIENT PHYSICAL THERAPY THORACOLUMBAR TREATMENT   Patient Name: Heidi [name] MRN: [id number] DOB:August 21, 1971, 53 y.o., female Today's Date: 05/17/2024  END OF SESSION:  PT End of Session - 05/17/24 0939     Visit Number 6    Number of Visits 8    Date for PT Re-Evaluation 05/26/24    Authorization Type [employer]    Authorization Time Period [employer] approved 7 visits from 03/31/2024-09/27/2024    Authorization - Visit Number 5    Authorization - Number of Visits 7    Progress Note Due on Visit 8    PT Start Time 0934    PT Stop Time 1015    PT Time Calculation (min) 41 min    Activity Tolerance Patient tolerated treatment well    Behavior During Therapy [facility] for tasks assessed/performed               Past Medical History:  Diagnosis Date   Alcohol withdrawal (HCC)    Anxiety    Cervical radiculopathy    Depression    Diabetes mellitus without complication (HCC)    GERD (gastroesophageal reflux disease)    Hypertension    Past Surgical History:  Procedure Laterality Date   BIOPSY  11/30/2023   Procedure: BIOPSY;  Surgeon: Vinetta Greening, DO;  [facility];  Service: Endoscopy;;   BRAIN SURGERY     COLONOSCOPY WITH PROPOFOL  N/A 11/30/2023   Surgeon: Goble Last K, DO;   5 mm polyp resected and retrieved, stool throughout the entire colon.  Pathology with tubular adenoma.  Recommended 5-year surveillance.   ESOPHAGOGASTRODUODENOSCOPY (EGD) WITH PROPOFOL  N/A 11/30/2023   Surgeon: Vinetta Greening, DO;  Small hiatal hernia, gastritis biopsied, duodenitis.  Pathology with reactive gastropathy and focal erosion, negative for H. pylori. Recommended PPI BID.   FLEXIBLE SIGMOIDOSCOPY N/A 07/13/2023   Procedure: FLEXIBLE SIGMOIDOSCOPY;  Surgeon: Urban Garden, MD;  Location: [facility];  Service: Gastroenterology;  Laterality: N/A;   POLYPECTOMY  11/30/2023   Procedure: POLYPECTOMY;  Surgeon: Vinetta Greening, DO;  Location: [facility];  Service: Endoscopy;;   Patient Active Problem List   Diagnosis Date Noted   Spinal stenosis of lumbar region with neurogenic claudication 05/11/2024   DOE (dyspnea on exertion) 04/17/2024   Cigarette smoker 04/17/2024   Chest pain 02/08/2024   Lumbar facet arthropathy 12/28/2023   Chronic pain syndrome 12/28/2023   Cervical cancer screening 10/26/2023   Degenerative disc disease, lumbar 08/11/2023   Hospital discharge follow-up 07/29/2023   Laceration of spleen 07/24/2023   Ileus (HCC) 07/13/2023   Elevated LFTs 07/12/2023   Colonic obstruction (HCC) 07/12/2023   Hypothermia 07/09/2023   Hypokalemia 07/06/2023   Thrombocytopenia (HCC) 07/06/2023   Chronic back pain 05/18/2023   Bilateral knee pain 04/26/2023   Depression, recurrent (HCC) 04/26/2023   Alcohol use disorder 04/20/2023   Carpal tunnel syndrome of right wrist 04/20/2023   Tachycardia 04/20/2023   Transaminitis 04/06/2023   GERD (gastroesophageal reflux disease) 04/06/2023   Tobacco use disorder 04/06/2023   Alcohol withdrawal (HCC) 10/19/2022   Essential hypertension 10/19/2022   Nausea & vomiting 10/19/2022   Hypomagnesemia 10/19/2022   Cervical radicular pain 10/29/2010    PCP: Rosanna Comment, FNP  REFERRING PROVIDER: Rosanna Comment, FNP  REFERRING DIAG: M54.50 (ICD-10-CM) - Lumbar pain   Rationale for Evaluation and Treatment: Rehabilitation  THERAPY DIAG:  Muscle weakness (generalized)  Low back pain, unspecified back pain laterality, unspecified chronicity,  unspecified whether sciatica present  Chronic bilateral low back pain without sciatica  ONSET DATE: "numerous years"  SUBJECTIVE:                                                                                                                                                                                           SUBJECTIVE STATEMENT: Back pain is a little worse; 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  Usually checks her blood sugar 1 time a week.Taking prednisone .    Eval:  Pt reporting she was coming here earlier last year but was told by upfront staff to, "not come back until after December." Pt reporting constant back pain that has been severing limiting and is always in pain.   PERTINENT HISTORY:  Hx of knee pain, cervical pain, and see some bilateral UE numbness  PAIN:  Are you having pain? Yes: NPRS scale: 7/10 Pain location: low back/R hip Pain description: achy/dull Aggravating factors: walking/standing Relieving factors: resting/laying down  PRECAUTIONS: None  RED FLAGS: None   WEIGHT BEARING RESTRICTIONS: No  FALLS:  Has patient fallen in last 6 months? No   PATIENT GOALS: "get out of pain"  NEXT MD VISIT:   OBJECTIVE:  Note: Objective measures were completed at Evaluation unless otherwise noted.  DIAGNOSTIC FINDINGS:  MRI:L3-4 there is a mild disc bulge flattening the ventral thecal sac and small right foraminal disc extrusion. Moderate-severe bilateral facet arthropathy with small facet effusions. Moderate central canal stenosis. Severe right foraminal stenosis. Moderate left foraminal stenosis.  PATIENT SURVEYS:  Modified Oswestry 38/50 = 76%   COGNITION: Overall cognitive status: Within functional limits for tasks assessed     SENSATION: WFL  POSTURE: rounded shoulders, forward head, increased lumbar lordosis, and anterior pelvic tilt  PALPATION: Tender to touch bilateral lumbar paraspinals  LUMBAR ROM:   AROM eval 04/06/24  Flexion 90% and painful   Extension 100% and painful   Right lateral flexion  100% c/o tightness on Lt QL region, hand past knee  Left lateral flexion  100% painfree with hand past knee  Right rotation  50% painful center of  and c/o tightness  Left rotation  50% painful and c/o tightness   (Blank rows = not tested)  LOWER  EXTREMITY ROM:     Active  Right eval Left eval  Hip flexion    Hip extension    Hip abduction    Hip adduction    Hip internal rotation    Hip external rotation  Knee flexion    Knee extension    Ankle dorsiflexion    Ankle plantarflexion    Ankle inversion    Ankle eversion     (Blank rows = not tested)  LOWER EXTREMITY MMT:    MMT Right eval Left eval  Hip flexion 4- 4-  Hip extension 3+ 4-  Hip abduction 3+ 4-  Hip adduction    Hip internal rotation    Hip external rotation    Knee flexion    Knee extension    Ankle dorsiflexion    Ankle plantarflexion    Ankle inversion    Ankle eversion     (Blank rows = not tested)  FUNCTIONAL TESTS:  Evaluation:  30 Second Chair Stand Test: 10x Norms:   Age 79-64 77-69 70-74 75-79 80-84 85-89 90-94  Women 15 15 14 13 12 11 9   Men 17 16 15 14 13 11 9   2  minute walk test: 261ft  05/17/24:  with RW 310 feet  GAIT: Distance walked: > 358ft with total ambulation throughout session Assistive device utilized: None Level of assistance: Complete Independence Comments: externally rotated BLE during swing and stance phase with antalgia via RLE  TREATMENT DATE:  05/17/24 Nustep seat 9 UE/LE level 3 [place] 5 minutes Standing: heelraises 20X  Toeraises 20X  Hip abduction 2X10  Hip extension 2X10  Alt marching 2X10  Hamstrings stretch 2X30" each on 12" step  with RW 310 feet  05/09/24 Nustep seat 9 UE and LE x 5' level 2 dynamic warm up Supine: Decompression position with moist heat to low back with instruction in breathing exercise for relaxation x 5' Abdominal bracing 5" hold x 10 Scapular retraction 5" hold x 10 SKTC 3 x 20" each O2 sat in supine 89% O2 sat in sitting 97% Standing: Calf raises x 10 Calf stretch x 20" Updated HEP    04/26/24: - Nustep UE/LE 5' [place] trail Supine: Reviewed log rolling - LTR 5x 10" - SKTC  2x 30" - Pubic clearning (isometric add ball squeeze to abd into  belt Seated:   - STS 2 sets 5 reps (SOB following 5 reps)  O2 sat at 97%  Standing:  RTB shoulder extension 2x 5 reps    PATIENT EDUCATION:  Education details: PT Evaluation, findings, prognosis, frequency, attendance policy, and HEP. Person educated: Patient Education method: Medical illustrator Education comprehension: verbalized understanding  HOME EXERCISE PROGRAM: Access Code: [email or web address] URL: [email or web address] Date: 05/09/2024 Prepared by: [facility]  Exercises - Supine Scapular Retraction  - 2 x daily - 7 x weekly - 1 sets - 10 reps - 5 sec hold - Heel Raises with Counter Support  - 2 x daily - 7 x weekly - 1 sets - 10 reps - Gastroc Stretch on Wall  - 2 x daily - 7 x weekly - 1 sets - 3 reps - 20 sec hold Access Code: [email or web address] URL: [email or web address] Date: 03/31/2024 Prepared by: Irene Mannheim  Exercises - Supine Transversus Abdominis Bracing - Hands on Stomach  - 1 x daily - 7 x weekly - 3 sets - 10 reps - Supine Bridge  - 1 x daily - 7 x weekly - 3 sets - 10 reps - 5 hold - Supine Posterior Pelvic Tilt  - 1 x daily - 7 x weekly - 3 sets - 10 reps - 10 hold - Clamshell  - 1 x daily - 7 x weekly - 3 sets - 10 reps -  5 hold - Supine Piriformis Stretch with Foot on Ground  - 1 x daily - 7 x weekly - 3 sets - 3 reps - 30 hold  04/06/24:  Decompression  04/26/24- Hooklying Isometric Hip Abduction with Belt  - 2 x daily - 7 x weekly - 1 sets - 10 reps - 5" hold Walking program  05/17/24 Hip abduction, extension marching with RTB Postural strengthening RTB (sheet in filing cabinet)   ASSESSMENT:  CLINICAL IMPRESSION: Today's session started with warm up on Nustep and progressed to standing LE strengthening exercises.  Updated HEP to include postural and hip strengthening using red theraband.  Pt overall tolerated these well with rest breaks as needed. No increased pain while completing exercises today.  Some shortness of  breath noted.   Cues needed to maintain upright posturing while completing exercises.  Cues also needed for engaging core and proper breathing with exercise.  Overall reduced pain levels to 3/10 at the end of treatment today.  2 minute walk test improved by 42 feet today as compared to initial evaluation.  Patient will benefit from continued skilled therapy services to address deficits and promote return to optimal function.      Eval:  Patient is a 53 y.o. female who was seen today for physical therapy evaluation and treatment for M54.50 (ICD-10-CM) - Lumbar pain. Pt reports severity of pain and symptoms are rated as severe but objective measures do not show same level of severity. Pt with chronic back pain in nature, pt also with poor attendance in previous PT POC. Pt is demonstrating functional limitations, reduced capacity for ambulation and functional mobility due to muscle weakness, ROM deficits, chronic pain and poor lumbar posture. Pt will benefit from skilled Physical Therapy services to address deficits/limitations in order to improve functional and QOL.    OBJECTIVE IMPAIRMENTS: decreased activity tolerance, decreased balance, decreased mobility, decreased ROM, decreased strength, hypomobility, postural dysfunction, and pain.   ACTIVITY LIMITATIONS: carrying, lifting, bending, sitting, standing, squatting, sleeping, stairs, transfers, bed mobility, toileting, dressing, reach over head, hygiene/grooming, and locomotion level  PARTICIPATION LIMITATIONS: meal prep, cleaning, laundry, driving, shopping, community activity, occupation, and yard work  PERSONAL FACTORS: Age and Behavior pattern are also affecting patient's functional outcome.   REHAB POTENTIAL: Fair poor attendace  CLINICAL DECISION MAKING: Stable/uncomplicated  EVALUATION COMPLEXITY: Low   GOALS: Goals reviewed with patient? No  SHORT TERM GOALS: Target date: 04/28/24  Pt will be independent with HEP in order to  demonstrate participation in Physical Therapy POC.  Baseline: Goal status: INITIAL  2.  Pt will report 4/10 pain with mobility in order to demonstrate improved pain with ADLs.  Baseline:  Goal status: INITIAL  LONG TERM GOALS: Target date: 05/26/2024  Pt will improve 30 Second Chair Stand test by at least 5 in order to demonstrate improved functional strength to return to desired activities.  Baseline: see objective.  Goal status: INITIAL  2.  Pt will improve 2 MWT by at least 120ft in order to demonstrate improved functional ambulatory capacity in community setting.  Baseline: see objective.  Goal status: INITIAL  3.  Pt will improve Modified Oswestry score by 15 points in order to demonstrate improved pain with functional goals and outcomes. Baseline: see objective.  Goal status: INITIAL  4.  Pt will report 3/10 pain with mobility in order to demonstrate reduced pain with ADLs lasting greater than 30 minutes.  Baseline: see objective.  Goal status: INITIAL  PLAN:  PT FREQUENCY: 1x/week  PT  DURATION: 8 weeks *scheduling one visit at a time*  PLANNED INTERVENTIONS: 97164- PT Re-evaluation, 97750- Physical Performance Testing, 97110-Therapeutic exercises, 97530- Therapeutic activity, 97112- Neuromuscular re-education, 97535- Self Care, 04540- Manual therapy, [phone]- Gait training, [phone]- Electrical stimulation (unattended), [phone]- Electrical stimulation (manual), Patient/Family education, Balance training, Stair training, Taping, Dry Needling, Joint mobilization, Joint manipulation, Spinal manipulation, Spinal mobilization, Cryotherapy, and Moist heat.  PLAN FOR NEXT SESSION: one visit at a time due to attendance.  Focus on  gluteal weakness, posture.   9:44 AM, 05/17/24 Lorenso Romance, PTA/CLT [facility] Ph: [phone]

## 2024-05-17 NOTE — Telephone Encounter (Signed)
 Copied from CRM [id number]. Topic: General - Other >> May 17, 2024 11:15 AM Felizardo Hotter wrote: Reason for CRM: Received call from pt regarding form [employer] disabled services forms need to be faxed to: [phone] [place] list. Please call pt at [phone]

## 2024-05-18 NOTE — Telephone Encounter (Signed)
 Let pt know the form was faxed back to the number on the form Heidi [name]). She can come collect the original from [place] if she wants

## 2024-05-18 NOTE — Telephone Encounter (Signed)
 Form faxed to the original fax number requested to [employer] and the form was sent to be scanned into the chart and it is not there yet.

## 2024-05-19 NOTE — Telephone Encounter (Signed)
 Heidi [name] from [place] called again yesterday- agent informed her of note below.

## 2024-05-23 ENCOUNTER — Telehealth: Payer: Self-pay

## 2024-05-23 NOTE — Telephone Encounter (Signed)
 Copied from CRM [id number]. Topic: Medical Record Request - Provider/Facility Request >> May 23, 2024 11:34 AM Ivette P wrote: Reason for CRM: [place] called in to report that [place] listss is requesting if CAP could be re- faxed because have yet to receive CAP paperwork and has a time constraint    Callback if any further questions [phone]   Fax number [phone]

## 2024-05-24 ENCOUNTER — Ambulatory Visit (INDEPENDENT_AMBULATORY_CARE_PROVIDER_SITE_OTHER): Payer: MEDICAID | Admitting: Family Medicine

## 2024-05-24 ENCOUNTER — Encounter: Payer: Self-pay | Admitting: Family Medicine

## 2024-05-24 VITALS — BP 140/84 | HR 87 | Resp 16 | Ht 63.0 in | Wt 198.0 lb

## 2024-05-24 DIAGNOSIS — R1310 Dysphagia, unspecified: Secondary | ICD-10-CM | POA: Insufficient documentation

## 2024-05-24 DIAGNOSIS — F419 Anxiety disorder, unspecified: Secondary | ICD-10-CM

## 2024-05-24 DIAGNOSIS — F32A Depression, unspecified: Secondary | ICD-10-CM | POA: Diagnosis not present

## 2024-05-24 DIAGNOSIS — Z7689 Persons encountering health services in other specified circumstances: Secondary | ICD-10-CM

## 2024-05-24 MED ORDER — LIDOCAINE VISCOUS HCL 2 % MT SOLN: 15.0000 mL | OROMUCOSAL | 0 refills | Status: DC | PRN

## 2024-05-24 MED ORDER — BUSPIRONE HCL 10 MG PO TABS
10.0000 mg | ORAL_TABLET | Freq: Two times a day (BID) | ORAL | 2 refills | Status: DC
Start: 2024-05-24 — End: 2024-06-16

## 2024-05-24 MED ORDER — SERTRALINE HCL 25 MG PO TABS
25.0000 mg | ORAL_TABLET | Freq: Every day | ORAL | 3 refills | Status: DC
Start: 2024-05-24 — End: 2024-06-13

## 2024-05-24 NOTE — Assessment & Plan Note (Signed)
 Advise to follow up with GI for swallow study Discussed take small bites, chew food thoroughly, and eat slowly while sitting upright. Avoid thin liquids and opt for thicker textures to reduce the risk of choking or aspiration.

## 2024-05-24 NOTE — Telephone Encounter (Signed)
 Refaxing again to different fax number requested.

## 2024-05-24 NOTE — Assessment & Plan Note (Addendum)
    04/13/2024   11:04 AM 09/23/2023    1:52 PM 08/11/2023    2:01 PM 05/18/2023   11:05 AM  GAD 7 : Generalized Anxiety Score  Nervous, Anxious, on Edge 1 1 1  0  Control/stop worrying 1 3 2  0  Worry too much - different things 1 3 2 1   Trouble relaxing 2 3 3  0  Restless 0 1 0 0  Easily annoyed or irritable 2 3 2 1   Afraid - awful might happen 1 0 0 0  Total GAD 7 Score 8 14 10 2   Anxiety Difficulty Very difficult Somewhat difficult Very difficult Very difficult   Flowsheet Row Office Visit from 04/13/2024 in [facility]  PHQ-9 Total Score 10        Trial on Zoloft 25 mg once daily and Buspar 10 mg twice daily as needed We discussed several non-pharmacological approaches to managing anxiety and depression, including:  Establishing a consistent daily routine: This helps create structure and stability. Practicing mindfulness and relaxation techniques: Incorporating meditation, deep breathing exercises, or yoga to manage stress and improve emotional well-being. Engaging in regular physical activity: Aim for at least 30 minutes of exercise most days to boost mood and energy levels. Spending time outdoors: Exposure to natural light and fresh air can improve mental health. Building a support network: Encouraging social connections with friends, family, or support groups to reduce feelings of isolation. Prioritizing a balanced diet: Eating nutrient-rich foods while avoiding excessive amounts of processed foods, sugar, and unhealthy fats. Follow-up is recommended in 4-8 weeks to assess progress, with a referral to behavioral health for further support if needed.

## 2024-05-24 NOTE — Telephone Encounter (Signed)
 Faxed to [phone] with confirmation

## 2024-05-24 NOTE — Progress Notes (Signed)
 Established Patient Office Visit   Subjective  Patient ID: Heidi [name], female    DOB: 1971/05/01  Age: 53 y.o. MRN: [id number]  Chief Complaint  Patient presents with   Dysphagia    X 2 weeks, she feels like her throat is closing up and food gets stuck going down and sometimes she feels like she can't breathe good because of it    Anxiety    States her nerves have been bad and she wants to get back on the valium     She  has a past medical history of Alcohol withdrawal (HCC), Anxiety, Cervical radiculopathy, Depression, Diabetes mellitus without complication (HCC), GERD (gastroesophageal reflux disease), and Hypertension.   Dysphagia: Patient complains of difficulty swallowing. The dysphagia occurs with both solids and liquids Symptoms have been present for approximately 2 weeks. The symptoms are gradually worsening. The dysphagia has been constant and has been progressive.  She complains of occasional heartburn and indigestion.  She denies melena, hematochezia, hematemesis, and coffee ground emesis.  This has been associated with abdominal bloating, deep pressure at base of neck, difficulty swallowing, dysphagia, and need to clear throat frequently.  She denies bilious reflux, hematemesis, and wheezing.      Review of Systems  Constitutional:  Negative for chills and fever.  Eyes:  Negative for blurred vision.  Respiratory:  Negative for shortness of breath.   Cardiovascular:  Negative for chest pain.  Neurological:  Negative for dizziness and headaches.      Objective:     BP (!) 140/84   Pulse 87   Resp 16   Ht 5\' 3"  (1.6 m)   Wt 198 lb (89.8 kg)   SpO2 91%   BMI 35.07 kg/m  BP Readings from Last 3 Encounters:  05/24/24 (!) 140/84  04/17/24 112/68  04/13/24 118/79      Physical Exam Vitals reviewed.  Constitutional:      General: She is not in acute distress.    Appearance: Normal appearance. She is not ill-appearing, toxic-appearing or diaphoretic.  HENT:      Head: Normocephalic.     Mouth/Throat:     Mouth: Mucous membranes are moist.     Pharynx: No oropharyngeal exudate or posterior oropharyngeal erythema.  Eyes:     General:        Right eye: No discharge.        Left eye: No discharge.     Conjunctiva/sclera: Conjunctivae normal.  Cardiovascular:     Rate and Rhythm: Normal rate.     Pulses: Normal pulses.     Heart sounds: Normal heart sounds.  Pulmonary:     Effort: Pulmonary effort is normal. No respiratory distress.     Breath sounds: Normal breath sounds.  Abdominal:     General: Bowel sounds are normal.     Palpations: Abdomen is soft.     Tenderness: There is no abdominal tenderness. There is no guarding.  Skin:    General: Skin is warm and dry.     Capillary Refill: Capillary refill takes less than 2 seconds.  Neurological:     Mental Status: She is alert.  Psychiatric:        Mood and Affect: Mood normal.        Behavior: Behavior normal.      No results found for any visits on 05/24/24.  The 10-year ASCVD risk score (Arnett DK, et al., 2019) is: 5.6%    Assessment & Plan:  Anxiety Assessment & Plan:  04/13/2024   11:04 AM 09/23/2023    1:52 PM 08/11/2023    2:01 PM 05/18/2023   11:05 AM  GAD 7 : Generalized Anxiety Score  Nervous, Anxious, on Edge 1 1 1  0  Control/stop worrying 1 3 2  0  Worry too much - different things 1 3 2 1   Trouble relaxing 2 3 3  0  Restless 0 1 0 0  Easily annoyed or irritable 2 3 2 1   Afraid - awful might happen 1 0 0 0  Total GAD 7 Score 8 14 10 2   Anxiety Difficulty Very difficult Somewhat difficult Very difficult Very difficult   Flowsheet Row Office Visit from 04/13/2024 in [facility]  PHQ-9 Total Score 10        Trial on Zoloft 25 mg once daily and Buspar 10 mg twice daily as needed We discussed several non-pharmacological approaches to managing anxiety and depression, including:  Establishing a consistent daily routine: This helps  create structure and stability. Practicing mindfulness and relaxation techniques: Incorporating meditation, deep breathing exercises, or yoga to manage stress and improve emotional well-being. Engaging in regular physical activity: Aim for at least 30 minutes of exercise most days to boost mood and energy levels. Spending time outdoors: Exposure to natural light and fresh air can improve mental health. Building a support network: Encouraging social connections with friends, family, or support groups to reduce feelings of isolation. Prioritizing a balanced diet: Eating nutrient-rich foods while avoiding excessive amounts of processed foods, sugar, and unhealthy fats. Follow-up is recommended in 4-8 weeks to assess progress, with a referral to behavioral health for further support if needed.   Orders: -     busPIRone HCl; Take 1 tablet (10 mg total) by mouth 2 (two) times daily.  Dispense: 60 tablet; Refill: 2 -     Sertraline HCl; Take 1 tablet (25 mg total) by mouth daily.  Dispense: 30 tablet; Refill: 3  Referral of patient -     Ambulatory referral to Ophthalmology  Dysphagia, unspecified type Assessment & Plan: Advise to follow up with GI for swallow study Discussed take small bites, chew food thoroughly, and eat slowly while sitting upright. Avoid thin liquids and opt for thicker textures to reduce the risk of choking or aspiration.   Anxiety and depression Assessment & Plan:    04/13/2024   11:04 AM 09/23/2023    1:52 PM 08/11/2023    2:01 PM 05/18/2023   11:05 AM  GAD 7 : Generalized Anxiety Score  Nervous, Anxious, on Edge 1 1 1  0  Control/stop worrying 1 3 2  0  Worry too much - different things 1 3 2 1   Trouble relaxing 2 3 3  0  Restless 0 1 0 0  Easily annoyed or irritable 2 3 2 1   Afraid - awful might happen 1 0 0 0  Total GAD 7 Score 8 14 10 2   Anxiety Difficulty Very difficult Somewhat difficult Very difficult Very difficult   Flowsheet Row Office Visit from 04/13/2024  in [facility]  PHQ-9 Total Score 10        Trial on Zoloft 25 mg once daily and Buspar 10 mg twice daily as needed We discussed several non-pharmacological approaches to managing anxiety and depression, including:  Establishing a consistent daily routine: This helps create structure and stability. Practicing mindfulness and relaxation techniques: Incorporating meditation, deep breathing exercises, or yoga to manage stress and improve emotional well-being. Engaging in regular physical activity: Aim  for at least 30 minutes of exercise most days to boost mood and energy levels. Spending time outdoors: Exposure to natural light and fresh air can improve mental health. Building a support network: Encouraging social connections with friends, family, or support groups to reduce feelings of isolation. Prioritizing a balanced diet: Eating nutrient-rich foods while avoiding excessive amounts of processed foods, sugar, and unhealthy fats. Follow-up is recommended in 4-8 weeks to assess progress, with a referral to behavioral health for further support if needed.    Other orders -     Lidocaine  Viscous HCl; Use as directed 15 mLs in the mouth or throat as needed.  Dispense: 100 mL; Refill: 0    Return if symptoms worsen or fail to improve.   Avelino Lek Amber Bail, FNP

## 2024-05-24 NOTE — Patient Instructions (Addendum)
        Great to see you today.   Anxiety medication: Take Zoloft 25 mg once daily and Buspar 10 mg twice daily   Follow up [facility] for dysphagia [facility] at [facility] [address] [place], [place] [address] [phone]   - Please take medications as prescribed. - Follow up with your primary health provider if any health concerns arises. - If symptoms worsen please contact your primary care provider and/or visit the emergency department.

## 2024-05-25 ENCOUNTER — Encounter (HOSPITAL_COMMUNITY): Payer: Self-pay

## 2024-05-25 ENCOUNTER — Ambulatory Visit (HOSPITAL_COMMUNITY): Payer: MEDICAID | Attending: Family Medicine

## 2024-05-25 DIAGNOSIS — M6281 Muscle weakness (generalized): Secondary | ICD-10-CM | POA: Insufficient documentation

## 2024-05-25 DIAGNOSIS — M25531 Pain in right wrist: Secondary | ICD-10-CM | POA: Insufficient documentation

## 2024-05-25 DIAGNOSIS — M545 Low back pain, unspecified: Secondary | ICD-10-CM | POA: Diagnosis present

## 2024-05-25 DIAGNOSIS — M25631 Stiffness of right wrist, not elsewhere classified: Secondary | ICD-10-CM | POA: Diagnosis present

## 2024-05-25 DIAGNOSIS — R29898 Other symptoms and signs involving the musculoskeletal system: Secondary | ICD-10-CM | POA: Diagnosis present

## 2024-05-25 DIAGNOSIS — G8929 Other chronic pain: Secondary | ICD-10-CM | POA: Insufficient documentation

## 2024-05-25 NOTE — Therapy (Signed)
 OUTPATIENT PHYSICAL THERAPY THORACOLUMBAR TREATMENT/PROGRESS NOTE   Patient Name: Heidi [name] MRN: [id number] DOB:[birth year], 53 y.o., female Today's Date: 05/25/2024  END OF SESSION:  PT End of Session - 05/25/24 1059     Visit Number 7    Number of Visits 8    Date for PT Re-Evaluation 05/26/24    Authorization Type [employer]    Authorization Time Period [employer] approved 7 visits from 03/31/2024-09/27/2024    Authorization - Visit Number 6    Authorization - Number of Visits 7    Progress Note Due on Visit 8    PT Start Time 1100    PT Stop Time 1140    PT Time Calculation (min) 40 min    Activity Tolerance Patient tolerated treatment well    Behavior During Therapy [facility] for tasks assessed/performed                Past Medical History:  Diagnosis Date   Alcohol withdrawal (HCC)    Anxiety    Cervical radiculopathy    Depression    Diabetes mellitus without complication (HCC)    GERD (gastroesophageal reflux disease)    Hypertension    Past Surgical History:  Procedure Laterality Date   BIOPSY  11/30/2023   Procedure: BIOPSY;  Surgeon: Vinetta Greening, DO;  [facility]: [facility];  Service: Endoscopy;;   BRAIN SURGERY     COLONOSCOPY WITH PROPOFOL  N/A 11/30/2023   Surgeon: Goble Last K, DO;   5 mm polyp resected and retrieved, stool throughout the entire colon.  Pathology with tubular adenoma.  Recommended 5-year surveillance.   ESOPHAGOGASTRODUODENOSCOPY (EGD) WITH PROPOFOL  N/A 11/30/2023   Surgeon: Vinetta Greening, DO;  Small hiatal hernia, gastritis biopsied, duodenitis.  Pathology with reactive gastropathy and focal erosion, negative for H. pylori. Recommended PPI BID.   FLEXIBLE SIGMOIDOSCOPY N/A 07/13/2023   Procedure: FLEXIBLE SIGMOIDOSCOPY;  Surgeon: Urban Garden, MD;  [facility];  Service: Gastroenterology;  Laterality: N/A;   POLYPECTOMY  11/30/2023   Procedure: POLYPECTOMY;  Surgeon: Vinetta Greening, DO;   [facility];  Service: Endoscopy;;   Patient Active Problem List   Diagnosis Date Noted   Dysphagia 05/24/2024   Anxiety and depression 05/24/2024   Spinal stenosis of lumbar region with neurogenic claudication 05/11/2024   DOE (dyspnea on exertion) 04/17/2024   Cigarette smoker 04/17/2024   Chest pain 02/08/2024   Lumbar facet arthropathy 12/28/2023   Chronic pain syndrome 12/28/2023   Cervical cancer screening 10/26/2023   Degenerative disc disease, lumbar 08/11/2023   Hospital discharge follow-up 07/29/2023   Laceration of spleen 07/24/2023   Ileus (HCC) 07/13/2023   Elevated LFTs 07/12/2023   Colonic obstruction (HCC) 07/12/2023   Hypothermia 07/09/2023   Hypokalemia 07/06/2023   Thrombocytopenia (HCC) 07/06/2023   Chronic back pain 05/18/2023   Bilateral knee pain 04/26/2023   Depression, recurrent (HCC) 04/26/2023   Alcohol use disorder 04/20/2023   Carpal tunnel syndrome of right wrist 04/20/2023   Tachycardia 04/20/2023   Transaminitis 04/06/2023   GERD (gastroesophageal reflux disease) 04/06/2023   Tobacco use disorder 04/06/2023   Alcohol withdrawal (HCC) 10/19/2022   Essential hypertension 10/19/2022   Nausea & vomiting 10/19/2022   Hypomagnesemia 10/19/2022   Cervical radicular pain 10/29/2010    PCP: Rosanna Comment, FNP  REFERRING PROVIDER: Rosanna Comment, FNP  REFERRING DIAG: M54.50 (ICD-10-CM) - Lumbar pain   Rationale for Evaluation and Treatment: Rehabilitation  THERAPY DIAG:  Muscle  weakness (generalized)  Low back pain, unspecified back pain laterality, unspecified chronicity, unspecified whether sciatica present  Chronic bilateral low back pain without sciatica  ONSET DATE: "numerous years"  SUBJECTIVE:                                                                                                                                                                                           SUBJECTIVE  STATEMENT: Pt states her back pain has been doing better but still like a stiff soreness. Still having sharp pain with specific movements. Pt states she feels she has gotten 75% better since starting therapy. 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 Pt states HEP compliance 3 days a week. Pt states she is having a rough day due to tooth surgery yesterday. Pain reported 7/10 today. Pt would like to continue therapy until doctors appointment at the end of June.   Eval:  Pt reporting she was coming here earlier last year but was told by upfront staff to, "not come back until after December." Pt reporting constant back pain that has been severing limiting and is always in pain.   PERTINENT HISTORY:  Hx of knee pain, cervical pain, and see some bilateral UE numbness  PAIN:  Are you having pain? Yes: NPRS scale: 7/10 Pain location: low back/R hip Pain description: achy/dull Aggravating factors: walking/standing Relieving factors: resting/laying down  PRECAUTIONS: None  RED FLAGS: None   WEIGHT BEARING RESTRICTIONS: No  FALLS:  Has patient fallen in last 6 months? No   PATIENT GOALS: "get out of pain"  NEXT MD VISIT:   OBJECTIVE:  Note: Objective measures were completed at Evaluation unless otherwise noted.  DIAGNOSTIC FINDINGS:  MRI:L3-4 there is a mild disc bulge flattening the ventral thecal sac and small right foraminal disc extrusion. Moderate-severe bilateral facet arthropathy with small facet effusions. Moderate central canal stenosis. Severe right foraminal stenosis. Moderate left foraminal stenosis.  PATIENT SURVEYS:  Modified Oswestry 38/50 = 76%  05/25/24: 28/50 COGNITION: Overall cognitive status: Within functional limits for tasks assessed     SENSATION: WFL  POSTURE: rounded shoulders, forward head, increased lumbar lordosis, and anterior pelvic tilt  PALPATION: Tender to touch bilateral lumbar  paraspinals  LUMBAR ROM:   AROM eval 04/06/24  Flexion 90% and painful   Extension 100% and painful   Right lateral flexion  100% c/o tightness on Lt QL region, hand past knee  Left lateral flexion  100% painfree with hand past knee  Right rotation  50% painful center of  and c/o tightness  Left  rotation  50% painful and c/o tightness   (Blank rows = not tested)  LOWER EXTREMITY ROM:     Active  Right eval Left eval  Hip flexion    Hip extension    Hip abduction    Hip adduction    Hip internal rotation    Hip external rotation    Knee flexion    Knee extension    Ankle dorsiflexion    Ankle plantarflexion    Ankle inversion    Ankle eversion     (Blank rows = not tested)  LOWER EXTREMITY MMT:    MMT Right eval Left eval  Hip flexion 4- 4-  Hip extension 3+ 4-  Hip abduction 3+ 4-  Hip adduction    Hip internal rotation    Hip external rotation    Knee flexion    Knee extension    Ankle dorsiflexion    Ankle plantarflexion    Ankle inversion    Ankle eversion     (Blank rows = not tested)  FUNCTIONAL TESTS:  Evaluation:  30 Second Chair Stand Test: 10x 30 Second Chair Stand Test: 9x Norms:   Age 53-64 53-69 70-74 75-79 80-84 85-89 90-94  Women 15 15 14 13 12 11 9   Men 17 16 15 14 13 11 9   2  minute walk test: 244ft  05/17/24:  with RW 310 feet 05/25/2024: : 130 feet and then needed sitting break, today is a "rough day."  GAIT: Distance walked: > 332ft with total ambulation throughout session Assistive device utilized: None Level of assistance: Complete Independence Comments: externally rotated BLE during swing and stance phase with antalgia via RLE  TREATMENT DATE:  05/25/2024  Therapeutic Exercise: -Supine bridges 1 set of 10 reps, 3 second holds, symptomatic, pt cued for max hip extension -Heel Raises, 1 set of 20 reps, pt requires demonstration -Standing 3 way hip 1 sets 5 reps, bilaterally, pt cued for upright trunk and maintaining  of neutral spine -LTR 2 set of 10 reps bilaterally, pt cued to remain in pain free ROM -Childs pose to prone press up stretch, 3 second holds, 8 reps (modified position require for prone position) -Cat/Cow stretch, 3 second holds, 6 reps, pt requires cueing for increased core activation   05/17/24 Nustep seat 9 UE/LE level 3 atl beach 5 minutes Standing: heelraises 20X  Toeraises 20X  Hip abduction 2X10  Hip extension 2X10  Alt marching 2X10  Hamstrings stretch 2X30" each on 12" step  with RW 310 feet  05/09/24 Nustep seat 9 UE and LE x 5' level 2 dynamic warm up Supine: Decompression position with moist heat to low back with instruction in breathing exercise for relaxation x 5' Abdominal bracing 5" hold x 10 Scapular retraction 5" hold x 10 SKTC 3 x 20" each O2 sat in supine 89% O2 sat in sitting 97% Standing: Calf raises x 10 Calf stretch x 20" Updated HEP   PATIENT EDUCATION:  Education details: PT Evaluation, findings, prognosis, frequency, attendance policy, and HEP. Person educated: Patient Education method: Medical illustrator Education comprehension: verbalized understanding  HOME EXERCISE PROGRAM: Access Code: [email or web address] URL: [email or web address] Date: 05/09/2024 Prepared by: [facility]  Exercises - Supine Scapular Retraction  - 2 x daily - 7 x weekly - 1 sets - 10 reps - 5 sec hold - Heel Raises with Counter Support  - 2 x daily - 7 x weekly - 1 sets - 10 reps - Gastroc Stretch on Wall  -  2 x daily - 7 x weekly - 1 sets - 3 reps - 20 sec hold Access Code: [email or web address] URL: [email or web address] Date: 03/31/2024 Prepared by: Irene Mannheim  Exercises - Supine Transversus Abdominis Bracing - Hands on Stomach  - 1 x daily - 7 x weekly - 3 sets - 10 reps - Supine Bridge  - 1 x daily - 7 x weekly - 3 sets - 10 reps - 5 hold - Supine Posterior Pelvic Tilt  - 1 x daily - 7 x weekly - 3 sets - 10 reps - 10 hold - Clamshell   - 1 x daily - 7 x weekly - 3 sets - 10 reps - 5 hold - Supine Piriformis Stretch with Foot on Ground  - 1 x daily - 7 x weekly - 3 sets - 3 reps - 30 hold  04/06/24:  Decompression  04/26/24- Hooklying Isometric Hip Abduction with Belt  - 2 x daily - 7 x weekly - 1 sets - 10 reps - 5" hold Walking program  05/17/24 Hip abduction, extension marching with RTB Postural strengthening RTB (sheet in filing cabinet) Access Code: [email or web address] URL: [email or web address] Date: 05/25/2024 Prepared by: Armond Bertin  Exercises - Child's Pose Stretch  - 1 x daily - 7 x weekly - 3 sets - 10 reps - Cat Cow  - 1 x daily - 7 x weekly - 3 sets - 10 reps  ASSESSMENT:  CLINICAL IMPRESSION: Patient continues to demonstrate back pain, decreased LE/core strength, decreased gait quality and balance. Patient also demonstrates decreased endurance with need for multiple rest breaks between exercises during today's session. Patient able to progress dynamic balance and core activation exercises today with cat/cow and child pose stretches, good performance with verbal cueing and demonstration. Pt continues demonstrating moderate limitations due to back pain and has shown only minimal improvement. Pt was educated on concern on lack of progress but pt would like to continue with therapy sessions for a few more weeks as she would not like to have surgery if she could help it. Patient would continue to benefit from skilled physical therapy for decreased low back pain, increased endurance with ambulation, increased LE/core strength, and improved balance for improved quality of life, improved independence with gait training, improved functional mobility and continued progress towards therapy goals.    Eval:  Patient is a 53 y.o. female who was seen today for physical therapy evaluation and treatment for M54.50 (ICD-10-CM) - Lumbar pain. Pt reports severity of pain and symptoms are rated as severe but objective measures  do not show same level of severity. Pt with chronic back pain in nature, pt also with poor attendance in previous PT POC. Pt is demonstrating functional limitations, reduced capacity for ambulation and functional mobility due to muscle weakness, ROM deficits, chronic pain and poor lumbar posture. Pt will benefit from skilled Physical Therapy services to address deficits/limitations in order to improve functional and QOL.    OBJECTIVE IMPAIRMENTS: decreased activity tolerance, decreased balance, decreased mobility, decreased ROM, decreased strength, hypomobility, postural dysfunction, and pain.   ACTIVITY LIMITATIONS: carrying, lifting, bending, sitting, standing, squatting, sleeping, stairs, transfers, bed mobility, toileting, dressing, reach over head, hygiene/grooming, and locomotion level  PARTICIPATION LIMITATIONS: meal prep, cleaning, laundry, driving, shopping, community activity, occupation, and yard work  PERSONAL FACTORS: Age and Behavior pattern are also affecting patient's functional outcome.   REHAB POTENTIAL: Fair poor attendace  CLINICAL DECISION MAKING: Stable/uncomplicated  EVALUATION COMPLEXITY: Low   GOALS: Goals reviewed with patient? No  SHORT TERM GOALS: Target date: 04/28/24  Pt will be independent with HEP in order to demonstrate participation in Physical Therapy POC.  Baseline: Goal status: IN PROGRESS  2.  Pt will report 4/10 pain with mobility in order to demonstrate improved pain with ADLs.  Baseline:  Goal status: NOT MET  LONG TERM GOALS: Target date: 05/26/2024  Pt will improve 30 Second Chair Stand test by at least 5 in order to demonstrate improved functional strength to return to desired activities.  Baseline: see objective.  Goal status: NOT MET  2.  Pt will improve 2 MWT by at least 167ft in order to demonstrate improved functional ambulatory capacity in community setting.  Baseline: see objective.  Goal status: NOT MET  3.  Pt will improve  Modified Oswestry score by 15 points in order to demonstrate improved pain with functional goals and outcomes. Baseline: see objective.  Goal status: NOT MET  4.  Pt will report 3/10 pain with mobility in order to demonstrate reduced pain with ADLs lasting greater than 30 minutes.  Baseline: see objective.  Goal status: NOT MET  PLAN:  PT FREQUENCY: 1x/week  PT DURATION: 8 weeks *scheduling one visit at a time*  PLANNED INTERVENTIONS: 97164- PT Re-evaluation, 97750- Physical Performance Testing, 97110-Therapeutic exercises, 97530- Therapeutic activity, 97112- Neuromuscular re-education, 97535- Self Care, 32440- Manual therapy, [phone]- Gait training, [phone]- Electrical stimulation (unattended), [phone]- Electrical stimulation (manual), Patient/Family education, Balance training, Stair training, Taping, Dry Needling, Joint mobilization, Joint manipulation, Spinal manipulation, Spinal mobilization, Cryotherapy, and Moist heat.  PLAN FOR NEXT SESSION: one visit at a time due to attendance.  Focus on  gluteal weakness, posture.   Seeking additional 4 weeks at 2 visits per week following treatment today for progress towards therapy goals.  Armond Bertin, PT, DPT [facility] Office: [phone] 11:55 AM, 05/25/24   Managed Medicaid Authorization Request   Visit Dx Codes: M54.50  M62.81   Functional Tool Score: 28/50   For all possible CPT codes, reference the Planned Interventions line above.                        Check all conditions that are expected to impact treatment: {Conditions expected to impact treatment:Unknown          If treatment provided at initial evaluation, no treatment charged due to lack of authorization.

## 2024-05-29 ENCOUNTER — Telehealth: Payer: Self-pay | Admitting: Family Medicine

## 2024-05-29 NOTE — Telephone Encounter (Signed)
 Copied from CRM [id number]. Topic: Medical Record Request - Provider/Facility Request >> May 23, 2024 11:34 AM Heidi [name] wrote: Reason for CRM: Heidi [name] called in to report that Heidi [name] is requesting if Heidi [name] could be re- faxed because have yet to receive Heidi [name] paperwork and has a time constraint    Callback if any further questions [phone]   Fax number [phone] >> May 29, 2024 11:20 AM Heidi [name] wrote: Calling back once again to see if the Heidi [name] paperwork can be uploaded to the patients chart in some way so that Heidi [name] can access it, to see if she can get this directly over herself. Please call if this is not possible. Fax has not been received.  Call back [phone].

## 2024-05-30 ENCOUNTER — Other Ambulatory Visit: Payer: Self-pay | Admitting: Family Medicine

## 2024-05-30 ENCOUNTER — Telehealth: Payer: Self-pay | Admitting: Family Medicine

## 2024-05-30 ENCOUNTER — Other Ambulatory Visit: Payer: Self-pay

## 2024-05-30 MED ORDER — ACCU-CHEK GUIDE TEST VI STRP: ORAL_STRIP | 3 refills | Status: AC

## 2024-05-30 NOTE — Telephone Encounter (Signed)
 Not on med list     Copied from CRM [id number]. Topic: Clinical - Medication Question >> May 30, 2024  1:10 PM Alpha Arts wrote: Reason for CRM: Patient would ACCU Check softclix test strips sent to preferred pharmacy.   Callback #: [phone]  Preferred Pharmacy: [facility] [place], [place] - [address] [place] [address] Phone: [phone] Fax: [phone] Hours: Not open 24 hours

## 2024-05-30 NOTE — Telephone Encounter (Signed)
 Accu chek guide which was the last strips she was dispensed was refilled to [facility]

## 2024-05-31 ENCOUNTER — Other Ambulatory Visit: Payer: Self-pay | Admitting: Family Medicine

## 2024-05-31 MED ORDER — VARENICLINE TARTRATE 0.5 MG PO TABS: 0.5000 mg | ORAL_TABLET | Freq: Two times a day (BID) | ORAL | 0 refills | Status: DC

## 2024-05-31 NOTE — Telephone Encounter (Signed)
 This has been refaxed multiple times with confirmation and pt also collected original

## 2024-05-31 NOTE — Telephone Encounter (Signed)
 Copied from CRM [id number]. Topic: Clinical - Medication Refill >> May 31, 2024  8:52 AM Ary Bitter R wrote: Medication: varenicline  (CHANTIX ) 0.5 MG tablet  Has the patient contacted their pharmacy? Yes, but haven't gotten a response  This is the patient's preferred pharmacy:  [facility] - [place], [place] - [address] [place] [address] Phone: [phone] Fax: [phone]  Is this the correct pharmacy for this prescription? Yes  Has the prescription been filled recently? Yes  Is the patient out of the medication? Yes for 2 days now  Has the patient been seen for an appointment in the last year OR does the patient have an upcoming appointment? Yes  Can we respond through MyChart? No  Agent: Please be advised that Rx refills may take up to 3 business days. We ask that you follow-up with your pharmacy.

## 2024-06-01 ENCOUNTER — Encounter (HOSPITAL_COMMUNITY): Payer: Self-pay

## 2024-06-01 ENCOUNTER — Ambulatory Visit (HOSPITAL_COMMUNITY): Payer: MEDICAID

## 2024-06-01 DIAGNOSIS — G8929 Other chronic pain: Secondary | ICD-10-CM

## 2024-06-01 DIAGNOSIS — M6281 Muscle weakness (generalized): Secondary | ICD-10-CM | POA: Diagnosis not present

## 2024-06-01 DIAGNOSIS — M545 Low back pain, unspecified: Secondary | ICD-10-CM

## 2024-06-01 NOTE — Therapy (Signed)
 OUTPATIENT PHYSICAL THERAPY THORACOLUMBAR TREATMENT   Heidi [name] Name: Heidi [name] MRN: [id number] DOB:15-Apr-1971, 53 y.o., female Today's Date: 06/01/2024  END OF SESSION:  Heidi [name] End of Session - 06/01/24 1021     Visit Number 8    Number of Visits 16    Date for Heidi [name] Re-Evaluation 06/22/24    Authorization Type [employer]    Authorization Time Period [employer] approved 7 visits from 03/31/2024-09/27/2024    Authorization - Visit Number 7    Authorization - Number of Visits 7    Heidi [name] Start Time 1021    Heidi [name] Stop Time 1100    Heidi [name] Time Calculation (min) 39 min    Activity Tolerance Heidi [name] tolerated treatment well    Behavior During Therapy [facility] for tasks assessed/performed             Past Medical History:  Diagnosis Date   Alcohol withdrawal (HCC)    Anxiety    Cervical radiculopathy    Depression    Diabetes mellitus without complication (HCC)    GERD (gastroesophageal reflux disease)    Hypertension    Past Surgical History:  Procedure Laterality Date   BIOPSY  11/30/2023   Procedure: BIOPSY;  Surgeon: Vinetta Greening, DO;  Location: [facility];  Service: Endoscopy;;   BRAIN SURGERY     COLONOSCOPY WITH PROPOFOL  N/A 11/30/2023   Surgeon: Goble Last K, DO;   5 mm polyp resected and retrieved, stool throughout the entire colon.  Pathology with tubular adenoma.  Recommended 5-year surveillance.   ESOPHAGOGASTRODUODENOSCOPY (EGD) WITH PROPOFOL  N/A 11/30/2023   Surgeon: Vinetta Greening, DO;  Small hiatal hernia, gastritis biopsied, duodenitis.  Pathology with reactive gastropathy and focal erosion, negative for H. pylori. Recommended PPI BID.   FLEXIBLE SIGMOIDOSCOPY N/A 07/13/2023   Procedure: FLEXIBLE SIGMOIDOSCOPY;  Surgeon: Urban Garden, MD;  Location: [facility];  Service: Gastroenterology;  Laterality: N/A;   POLYPECTOMY  11/30/2023   Procedure: POLYPECTOMY;  Surgeon: Vinetta Greening, DO;  Location: [facility];  Service: Endoscopy;;    Heidi [name] Active Problem List   Diagnosis Date Noted   Dysphagia 05/24/2024   Anxiety and depression 05/24/2024   Spinal stenosis of lumbar region with neurogenic claudication 05/11/2024   DOE (dyspnea on exertion) 04/17/2024   Cigarette smoker 04/17/2024   Chest pain 02/08/2024   Lumbar facet arthropathy 12/28/2023   Chronic pain syndrome 12/28/2023   Cervical cancer screening 10/26/2023   Degenerative disc disease, lumbar 08/11/2023   Hospital discharge follow-up 07/29/2023   Laceration of spleen 07/24/2023   Ileus (HCC) 07/13/2023   Elevated LFTs 07/12/2023   Colonic obstruction (HCC) 07/12/2023   Hypothermia 07/09/2023   Hypokalemia 07/06/2023   Thrombocytopenia (HCC) 07/06/2023   Chronic back pain 05/18/2023   Bilateral knee pain 04/26/2023   Depression, recurrent (HCC) 04/26/2023   Alcohol use disorder 04/20/2023   Carpal tunnel syndrome of right wrist 04/20/2023   Tachycardia 04/20/2023   Transaminitis 04/06/2023   GERD (gastroesophageal reflux disease) 04/06/2023   Tobacco use disorder 04/06/2023   Alcohol withdrawal (HCC) 10/19/2022   Essential hypertension 10/19/2022   Nausea & vomiting 10/19/2022   Hypomagnesemia 10/19/2022   Cervical radicular pain 10/29/2010    PCP: Rosanna Comment, FNP  REFERRING PROVIDER: Rosanna Comment, FNP  REFERRING DIAG: M54.50 (ICD-10-CM) - Lumbar pain   Rationale for Evaluation and Treatment: Rehabilitation  THERAPY DIAG:  Muscle weakness (generalized)  Low back pain, unspecified back pain laterality, unspecified chronicity, unspecified  whether sciatica present  Chronic bilateral low back pain without sciatica  ONSET DATE: numerous years  SUBJECTIVE:                                                                                                                                                                                           SUBJECTIVE STATEMENT: Heidi [name] reports 5/10 pain in low back today. Reports  she did not do her HEP yesterday but that's the only time she's skipped. Reports she walked 10 minutes yesterday.   Eval:  Heidi [name] reporting she was coming here earlier last year but was told by upfront staff to, not come back until after December. Heidi [name] reporting constant back pain that has been severing limiting and is always in pain.   PERTINENT HISTORY:  Hx of knee pain, cervical pain, and see some bilateral UE numbness  PAIN:  Are you having pain? Yes: NPRS scale: 7/10 Pain location: low back/R hip Pain description: achy/dull Aggravating factors: walking/standing Relieving factors: resting/laying down  PRECAUTIONS: None  RED FLAGS: None   WEIGHT BEARING RESTRICTIONS: No  FALLS:  Has Heidi [name] fallen in last 6 months? No   Heidi [name] GOALS: get out of pain  NEXT MD VISIT:   OBJECTIVE:  Note: Objective measures were completed at Evaluation unless otherwise noted.  DIAGNOSTIC FINDINGS:  MRI:L3-4 there is a mild disc bulge flattening the ventral thecal sac and small right foraminal disc extrusion. Moderate-severe bilateral facet arthropathy with small facet effusions. Moderate central canal stenosis. Severe right foraminal stenosis. Moderate left foraminal stenosis.  Heidi [name] SURVEYS:  Modified Oswestry 38/50 = 76%  05/25/24: 28/50 COGNITION: Overall cognitive status: Within functional limits for tasks assessed     SENSATION: WFL  POSTURE: rounded shoulders, forward head, increased lumbar lordosis, and anterior pelvic tilt  PALPATION: Tender to touch bilateral lumbar paraspinals  LUMBAR ROM:   AROM eval 04/06/24  Flexion 90% and painful   Extension 100% and painful   Right lateral flexion  100% c/o tightness on Lt QL region, hand past knee  Left lateral flexion  100% painfree with hand past knee  Right rotation  50% painful center of  and c/o tightness  Left rotation  50% painful and c/o tightness   (Blank rows = not tested)  LOWER EXTREMITY ROM:     Active   Right eval Left eval  Hip flexion    Hip extension    Hip abduction    Hip adduction    Hip internal rotation    Hip external rotation    Knee flexion    Knee extension    Ankle dorsiflexion    Ankle plantarflexion  Ankle inversion    Ankle eversion     (Blank rows = not tested)  LOWER EXTREMITY MMT:    MMT Right eval Left eval  Hip flexion 4- 4-  Hip extension 3+ 4-  Hip abduction 3+ 4-  Hip adduction    Hip internal rotation    Hip external rotation    Knee flexion    Knee extension    Ankle dorsiflexion    Ankle plantarflexion    Ankle inversion    Ankle eversion     (Blank rows = not tested)  FUNCTIONAL TESTS:  Evaluation:  30 Second Chair Stand Test: 10x 30 Second Chair Stand Test: 9x Norms:   Age 30-64 50-69 70-74 75-79 80-84 85-89 90-94  Women 15 15 14 13 12 11 9   Men 17 16 15 14 13 11 9   2  minute walk test: 216ft  05/17/24:  with RW 310 feet 05/25/2024: : 130 feet and then needed sitting break, today is a rough day.  GAIT: Distance walked: > 335ft with total ambulation throughout session Assistive device utilized: None Level of assistance: Complete Independence Comments: externally rotated BLE during swing and stance phase with antalgia via RLE  TREATMENT DATE:  06/01/24: NuStep, seat 7, 5', breaks throughout, level 1 Ambulation with NBQC, 139 ft LTR, 2 set of 10 reps bilaterally, Heidi [name] cued to remain in pain free ROM [employer], 15 holds 3x each LE Hip Abd + TA contract w/ MHP donned, RTB around knees, 2x10 SLR + TA contract w/ MHP donned, 2x10 ea LE  05/25/2024  Therapeutic Exercise: -Supine bridges 1 set of 10 reps, 3 second holds, symptomatic, Heidi [name] cued for max hip extension -Heel Raises, 1 set of 20 reps, Heidi [name] requires demonstration -Standing 3 way hip 1 sets 5 reps, bilaterally, Heidi [name] cued for upright trunk and maintaining of neutral spine -LTR 2 set of 10 reps bilaterally, Heidi [name] cued to remain in pain free ROM -Childs pose to  prone press up stretch, 3 second holds, 8 reps (modified position require for prone position) -Cat/Cow stretch, 3 second holds, 6 reps, Heidi [name] requires cueing for increased core activation   05/17/24 Nustep seat 9 UE/LE level 3 atl beach 5 minutes Standing: heelraises 20X  Toeraises 20X  Hip abduction 2X10  Hip extension 2X10  Alt marching 2X10  Hamstrings stretch 2X30 each on 12 step  with RW 310 feet   Heidi [name] EDUCATION:  Education details: Heidi [name] Evaluation, findings, prognosis, frequency, attendance policy, and HEP. Person educated: Heidi [name] Education method: Medical illustrator Education comprehension: verbalized understanding  HOME EXERCISE PROGRAM: Access Code: [email or web address] URL: [email or web address] Date: 05/09/2024 Prepared by: [facility] - [facility]  Exercises - Supine Scapular Retraction  - 2 x daily - 7 x weekly - 1 sets - 10 reps - 5 sec hold - Heel Raises with Counter Support  - 2 x daily - 7 x weekly - 1 sets - 10 reps - Gastroc Stretch on Wall  - 2 x daily - 7 x weekly - 1 sets - 3 reps - 20 sec hold Access Code: [email or web address] URL: [email or web address] Date: 03/31/2024 Prepared by: Irene Mannheim  Exercises - Supine Transversus Abdominis Bracing - Hands on Stomach  - 1 x daily - 7 x weekly - 3 sets - 10 reps - Supine Bridge  - 1 x daily - 7 x weekly - 3 sets - 10 reps - 5 hold - Supine Posterior Pelvic Tilt  - 1 x daily - 7 x weekly - 3  sets - 10 reps - 10 hold - Clamshell  - 1 x daily - 7 x weekly - 3 sets - 10 reps - 5 hold - Supine Piriformis Stretch with Foot on Ground  - 1 x daily - 7 x weekly - 3 sets - 3 reps - 30 hold  04/06/24:  Decompression  04/26/24- Hooklying Isometric Hip Abduction with Belt  - 2 x daily - 7 x weekly - 1 sets - 10 reps - 5 hold Walking program  05/17/24 Hip abduction, extension marching with RTB Postural strengthening RTB (sheet in filing cabinet) Access Code: [email or web address] URL:  [email or web address] Date: 05/25/2024 Prepared by: Armond Bertin  Exercises - Child's Pose Stretch  - 1 x daily - 7 x weekly - 3 sets - 10 reps - Cat Cow  - 1 x daily - 7 x weekly - 3 sets - 10 reps  ASSESSMENT:  CLINICAL IMPRESSION: Began session with general warm up on NuStep due to general stiffness, Heidi [name] requiring frequent rest breaks. Followed with ambulation trial with  NBQC, Heidi [name] demonstrating increased low back and groin pain at end of trial, bringing Heidi [name] to near tears and rushing to chair. Heidi [name] reports 7/10 pain. Followed with LTR, Heidi [name] reporting discomfort that improves with verbal cues to limit range. Thomas Stretch performed and added to HEP with Heidi [name] reporting good results. Verbal reminders for form to limit low back discomfort. Heidi [name] would continue to benefit from skilled physical therapy for decreased low back pain, increased endurance with ambulation, increased LE/core strength, and improved balance for improved quality of life, improved independence with gait training, improved functional mobility and continued progress towards therapy goals.     Eval:  Heidi [name] is a 53 y.o. female who was seen today for physical therapy evaluation and treatment for M54.50 (ICD-10-CM) - Lumbar pain. Heidi [name] reports severity of pain and symptoms are rated as severe but objective measures do not show same level of severity. Heidi [name] with chronic back pain in nature, Heidi [name] also with poor attendance in previous Heidi [name] POC. Heidi [name] is demonstrating functional limitations, reduced capacity for ambulation and functional mobility due to muscle weakness, ROM deficits, chronic pain and poor lumbar posture. Heidi [name] will benefit from skilled Physical Therapy services to address deficits/limitations in order to improve functional and QOL.    OBJECTIVE IMPAIRMENTS: decreased activity tolerance, decreased balance, decreased mobility, decreased ROM, decreased strength, hypomobility, postural dysfunction, and pain.    ACTIVITY LIMITATIONS: carrying, lifting, bending, sitting, standing, squatting, sleeping, stairs, transfers, bed mobility, toileting, dressing, reach over head, hygiene/grooming, and locomotion level  PARTICIPATION LIMITATIONS: meal prep, cleaning, laundry, driving, shopping, community activity, occupation, and yard work  PERSONAL FACTORS: Age and Behavior pattern are also affecting Heidi [name]'s functional outcome.   REHAB POTENTIAL: Fair poor attendace  CLINICAL DECISION MAKING: Stable/uncomplicated  EVALUATION COMPLEXITY: Low   GOALS: Goals reviewed with Heidi [name]? No  SHORT TERM GOALS: Target date: 06/15/24  Heidi [name] will be independent with HEP in order to demonstrate participation in Physical Therapy POC.  Baseline: Goal status: IN PROGRESS  2.  Heidi [name] will report 4/10 pain with mobility in order to demonstrate improved pain with ADLs.  Baseline:  Goal status: NOT MET  LONG TERM GOALS: Target date: 06/22/2024  Heidi [name] will improve 30 Second Chair Stand test by at least 5 in order to demonstrate improved functional strength to return to desired activities.  Baseline: see objective.  Goal status: NOT MET  2.  Heidi [name] will improve 2 MWT by at least 182ft in order to demonstrate improved  functional ambulatory capacity in community setting.  Baseline: see objective.  Goal status: NOT MET  3.  Heidi [name] will improve Modified Oswestry score by 15 points in order to demonstrate improved pain with functional goals and outcomes. Baseline: see objective.  Goal status: NOT MET  4.  Heidi [name] will report 3/10 pain with mobility in order to demonstrate reduced pain with ADLs lasting greater than 30 minutes.  Baseline: see objective.  Goal status: NOT MET  PLAN:  Heidi [name] FREQUENCY: 1x/week  Heidi [name] DURATION: 8 weeks *scheduling one visit at a time*  PLANNED INTERVENTIONS: 97164- Heidi [name] Re-evaluation, 97750- Physical Performance Testing, 97110-Therapeutic exercises, 97530- Therapeutic activity, 97112- Neuromuscular re-education,  97535- Self Care, 69629- Manual therapy, [phone]- Gait training, [phone]- Electrical stimulation (unattended), [phone]- Electrical stimulation (manual), Heidi [name]/Family education, Balance training, Stair training, Taping, Dry Needling, Joint mobilization, Joint manipulation, Spinal manipulation, Spinal mobilization, Cryotherapy, and Moist heat.  PLAN FOR NEXT SESSION: one visit at a time due to attendance.  Focus on  gluteal weakness, posture.    11:44 AM, 06/01/24 Marysue Sola, Heidi [name], DPT [facility] [place]

## 2024-06-04 NOTE — Progress Notes (Unsigned)
 Heidi [name], female    DOB: May 21, 1971    MRN: [id number]   Brief patient profile:  52  yobf  quit smoking April 2025  referred to pulmonary clinic in [place]  04/17/2024 by Dr Amanda Jungling  for  doe  with mostly restrictive changes on pfts 02/21/24   Seen by [facility] 06/2023 for TME/ ? Etoh w/d related rec clonidine /precedex     History of Present Illness  04/17/2024  Pulmonary/ 1st office eval/ Heidi [name] / [facility] only on prn saba hfa and neb  Chief Complaint  Patient presents with   Establish Care   Shortness of Breath  Dyspnea:  one aisle  due to back and breathing stopping her about the same time Cough: esp hs dry cough keeps up on couch resting on arm with one pillow x one month Sleep: on couch as above  SABA use: has hfa  2-3 x per day and neb maybe other 02: none  Rec Pantoprazole  40 mg Take 30- 60 min before your first and last meals of the day  GERD diet reviewed, bed blocks rec  Ok to try albuterol  15 min before an activity (on alternating days between the inhaler, then nebulizer and thennothing at all )  that you know would usually make you short of breath    Please schedule a follow up office visit in 6 weeks, call sooner if needed with all medications /inhalers/ solutions in hand   06/06/2024  f/u ov/[place] office/Heidi [name] re: doe c mostly restrictive changes on pfts 02/21/24 maint on no resp rx/ using saba though thought I needed to but none x 24 h  prior to OV  and no worse  Chief Complaint  Patient presents with   Follow-up   Dyspnea:  rides scooter due to back pain  Cough: none  SABA use: none on day of ov  02: none   No obvious day to day or daytime variability or assoc excess/ purulent sputum or mucus plugs or hemoptysis or cp or chest tightness, subjective wheeze or overt sinus or hb symptoms.    Also denies any obvious fluctuation of symptoms with weather or environmental changes or other aggravating or alleviating factors except as outlined above   No  unusual exposure hx or h/o childhood pna/ asthma or knowledge of premature birth.  Current Allergies, Complete Past Medical History, Past Surgical History, Family History, and Social History were reviewed in [employer] record.  ROS  The following are not active complaints unless bolded Hoarseness, sore throat, dysphagia, dental problems, itching, sneezing,  nasal congestion or discharge of excess mucus or purulent secretions, ear ache,   fever, chills, sweats, unintended wt loss or wt gain, classically pleuritic or exertional cp,  orthopnea pnd or arm/hand swelling  or leg swelling, presyncope, palpitations, abdominal pain, anorexia, nausea, vomiting, diarrhea  or change in bowel habits or change in bladder habits, change in stools or change in urine, dysuria, hematuria,  rash, arthralgias, visual complaints, headache, numbness, weakness or ataxia or problems with walking or coordination,  change in mood or  memory.        Current Meds  Medication Sig   Accu-Chek Softclix Lancets lancets 100 each by Other route 3 (three) times daily.   albuterol  (PROVENTIL ) (2.5 MG/3ML) 0.083% nebulizer solution Take 3 mLs (2.5 mg total) by nebulization every 6 (six) hours as needed for wheezing or shortness of breath.   albuterol  (VENTOLIN  HFA) 108 (90 Base) MCG/ACT inhaler Inhale 2 puffs into the  lungs every 6 (six) hours as needed for wheezing or shortness of breath.   Blood Glucose Monitoring Suppl DEVI 1 each by Does not apply route in the morning, at noon, and at bedtime. May substitute to any manufacturer covered by patient's insurance.   Blood Pressure Monitor DEVI 1 each by Does not apply route daily.   busPIRone  (BUSPAR ) 10 MG tablet Take 1 tablet (10 mg total) by mouth 2 (two) times daily.   cloNIDine  (CATAPRES  - DOSED IN MG/24 HR) 0.2 mg/24hr patch APPLY 1 PATCH TO THE SKIN ONCE WEEKLY.   cyclobenzaprine  (FLEXERIL ) 10 MG tablet Take 1 tablet (10 mg total) by mouth 2 (two) times  daily as needed for muscle spasms.   ferrous sulfate 325 (65 FE) MG tablet Take 325 mg by mouth daily with breakfast.   folic acid  (FOLVITE ) 1 MG tablet Take 1 tablet (1 mg total) by mouth daily.   furosemide  (LASIX ) 20 MG tablet Take 1 tablet (20 mg total) by mouth daily.   gabapentin  (NEURONTIN ) 400 MG capsule TAKE ONE CAPSULE BY MOUTH THREE TIMES DAILY   glucose blood (ACCU-CHEK GUIDE TEST) test strip Use as instructed three times daily dx e11.65   HYDROcodone -acetaminophen  (NORCO/VICODIN) 5-325 MG tablet Take 1 tablet by mouth every 4 (four) hours as needed.   ibuprofen  (ADVIL ) 800 MG tablet Take 800 mg by mouth every 8 (eight) hours as needed.   levocetirizine (XYZAL ) 5 MG tablet Take 1 tablet (5 mg total) by mouth every evening.   lidocaine  (LIDODERM ) 5 % Place 1 patch onto the skin daily. Remove & Discard patch within 12 hours or as directed by MD   lidocaine  (XYLOCAINE ) 2 % solution Use as directed 15 mLs in the mouth or throat as needed.   metFORMIN  (GLUCOPHAGE ) 500 MG tablet Take 1 tablet (500 mg total) by mouth 2 (two) times daily with a meal.   metoprolol  tartrate (LOPRESSOR ) 25 MG tablet Take 0.5 tablets (12.5 mg total) by mouth 2 (two) times daily.   Misc. Devices (GOJJI WEIGHT SCALE) MISC Daily Weight   naltrexone  (DEPADE) 50 MG tablet Take 1 tablet (50 mg total) by mouth daily.   olmesartan  (BENICAR ) 20 MG tablet Take 1 tablet (20 mg total) by mouth daily.   ondansetron  (ZOFRAN ) 4 MG tablet Take 1 tablet (4 mg total) by mouth every 8 (eight) hours as needed for nausea or vomiting.   pantoprazole  (PROTONIX ) 40 MG tablet Take 1 tablet (40 mg total) by mouth 2 (two) times daily.   polyethylene glycol powder (GLYCOLAX /MIRALAX ) 17 GM/SCOOP powder Take 17 g by mouth daily. Mix with 8 oz of water .   potassium chloride  (KLOR-CON ) 10 MEQ tablet Take 10 mEq by mouth daily.   predniSONE  (DELTASONE ) 10 MG tablet Take 1 tablet (10 mg total) by mouth 3 (three) times daily.   rosuvastatin   (CRESTOR ) 40 MG tablet Take 1 tablet (40 mg total) by mouth daily.   sertraline  (ZOLOFT ) 25 MG tablet Take 1 tablet (25 mg total) by mouth daily.   varenicline  (CHANTIX ) 0.5 MG tablet Take 1 tablet (0.5 mg total) by mouth 2 (two) times daily.            Past Medical History:  Diagnosis Date   Alcohol withdrawal (HCC)    Anxiety    Cervical radiculopathy    Depression    Diabetes mellitus without complication (HCC)    GERD (gastroesophageal reflux disease)    Hypertension       Objective:    Wts  06/06/2024       198   05/24/24 198 lb (89.8 kg)  04/17/24 192 lb (87.1 kg)  04/13/24 190 lb 1.3 oz (86.2 kg)     Vital signs reviewed  06/06/2024  - Note at rest 02 sats  93% on RA   General appearance:    amb bf walks with cane / waddling pattern     HEENT : Oropharynx  clear   Nasal turbinates nl    NECK :  without  apparent JVD/ palpable Nodes/TM    LUNGS: no acc muscle use,  Min barrel  contour chest wall with bilateral  slightly decreased bs s audible wheeze and  without cough on insp or exp maneuvers and min  Hyperresonant  to  percussion bilaterally    CV:  RRR  no s3 or murmur or increase in P2, and no edema   ABD:  soft and nontender with pos end  insp Hoover's  in the supine position.  No bruits or organomegaly appreciated   MS:    ext warm without deformities Or obvious joint restrictions  calf tenderness, cyanosis or clubbing     SKIN: warm and dry without lesions    NEURO:  alert, approp, nl sensorium with  no motor or cerebellar deficits apparent.             I personally reviewed images and agree with radiology impression as follows:  Chest CTa   02/08/24 1. Suboptimal opacification of the pulmonary arterial system. No evidence for acute pulmonary embolus to the lobar level. 2. Emphysema. 3. Trace pericardial effusion.     Assessment

## 2024-06-06 ENCOUNTER — Ambulatory Visit (INDEPENDENT_AMBULATORY_CARE_PROVIDER_SITE_OTHER): Payer: MEDICAID | Admitting: Internal Medicine

## 2024-06-06 ENCOUNTER — Encounter: Payer: Self-pay | Admitting: Internal Medicine

## 2024-06-06 ENCOUNTER — Telehealth: Payer: Self-pay | Admitting: Family Medicine

## 2024-06-06 ENCOUNTER — Encounter (HOSPITAL_COMMUNITY): Payer: MEDICAID

## 2024-06-06 VITALS — BP 124/78 | HR 114 | Ht 63.0 in | Wt 198.8 lb

## 2024-06-06 DIAGNOSIS — R0609 Other forms of dyspnea: Secondary | ICD-10-CM | POA: Diagnosis not present

## 2024-06-06 DIAGNOSIS — Z87891 Personal history of nicotine dependence: Secondary | ICD-10-CM | POA: Diagnosis not present

## 2024-06-06 NOTE — Patient Instructions (Signed)
 We will walk you today get a baseline on how well you are doing off cigarettes  My office will be contacting you by phone for referral to lung cancer screening   [phone]) - if you don't hear back from my office within one week,  please call us  back or notify us  thru MyChart and we'll address it right away.    Only use your albuterol  as a rescue medication to be used if you can't catch your breath by resting or doing a relaxed purse lip breathing pattern.  - The less you use it, the better it will work when you need it. - Ok to use up to 2 puffs  every 4 hours if you must but call for immediate appointment if use goes up over your usual need - Don't leave home without it !!  (think of it like the spare tire for your car)   Also  Ok to try albuterol  15 min before an activity (on alternating days)  that you know would usually make you short of breath and see if it makes any difference and if makes none then don't take albuterol  after activity unless you can't catch your breath as this means it's the resting that helps, not the albuterol .       Pulmonary follow up is needed

## 2024-06-06 NOTE — Assessment & Plan Note (Signed)
 Quit smoking 03/2024 - PFT's  02/21/24   FEV1 2.02 (74 % ) ratio 0.84  p 2 % improvement from saba p ? Saba  prior to study with DLCO  10.8 (52%)   and FV curve nl  and ERV 19% at wt 194 - 04/17/2024  @ 192  Walked on RA  x  2  lap(s) =  approx 300  ft  @ rollator/mod pace, stopped due to sob  with lowest 02 sats 98%     - 06/06/2024   Walked on RA  x  2  lap(s) =  approx 300  ft  @ slow pace, stopped due to back pain  with lowest 02 sats 94% off all resp rx    Luckily she has stopped smoking at a level of FEV1 which should serve her well absent flares of AB with uri's so rec she keep saba on hand for this purpose and f/u with pcp and this office prn

## 2024-06-06 NOTE — Telephone Encounter (Signed)
 Prescription Request  06/06/2024  LOV: 05/24/2024  What is the name of the medication or equipment? sertraline  (ZOLOFT ) 25 MG tablet [[id number]]    lidocaine  (LIDODERM ) 5 % [[id number]]   Have you contacted your pharmacy to request a refill? No   Which pharmacy would you like this sent to?  [facility] - [place], [place] - [address] [place] [address] Phone: [phone] Fax: [phone]    Patient notified that their request is being sent to the clinical staff for review and that they should receive a response within 2 business days.   Please advise at patient walked into the office

## 2024-06-06 NOTE — Assessment & Plan Note (Signed)
 Quit April 2025  - referred to [facility]  06/06/2024 >>>   Low-dose CT lung cancer screening is recommended for patients who are 70-51 years of age with a 20+ pack-year history of smoking and who are currently smoking or quit <=15 years ago. No coughing up blood  No unintentional weight loss of > 15 pounds in the last 6 months - pt is eligible for scanning yearly until 2040 > referred  Each maintenance medication was reviewed in detail including emphasizing most importantly the difference between maintenance and prns and under what circumstances the prns are to be triggered using an action plan format where appropriate.  Total time for H and P, chart review, counseling, reviewing hfa device(s) , directly observing portions of ambulatory 02 saturation study/ and generating customized AVS unique to this office visit / same day charting = 31 min final summary f/u ov

## 2024-06-08 ENCOUNTER — Encounter (HOSPITAL_COMMUNITY): Payer: Self-pay

## 2024-06-08 ENCOUNTER — Encounter (HOSPITAL_COMMUNITY): Payer: MEDICAID

## 2024-06-08 NOTE — Therapy (Signed)
 [facility] at [facility] [address] [place], [place], [address] Phone: [phone]   Fax:  [phone]  Patient Details  Name: Heidi [name] MRN: [id number] Date of Birth: 1971-05-27 Referring Provider:  No ref. provider found  Encounter Date: 06/08/2024  Pt called regarding no show #1. Pt said she forgot about appointment today and got it confused with another appointment. Pt informed/reminded of no show policy.   Gatha Kaska, PT 06/08/2024, 11:31 AM  [facility] at [facility] [address] [place], [place], [address] Phone: [phone]   Fax:  [phone]

## 2024-06-12 ENCOUNTER — Encounter (HOSPITAL_COMMUNITY): Payer: Self-pay | Admitting: Nurse Practitioner

## 2024-06-12 ENCOUNTER — Ambulatory Visit (HOSPITAL_COMMUNITY): Payer: MEDICAID | Admitting: Occupational Therapy

## 2024-06-13 ENCOUNTER — Other Ambulatory Visit: Payer: Self-pay | Admitting: Family Medicine

## 2024-06-13 ENCOUNTER — Other Ambulatory Visit (HOSPITAL_COMMUNITY): Payer: Self-pay | Admitting: Nurse Practitioner

## 2024-06-13 DIAGNOSIS — M51369 Other intervertebral disc degeneration, lumbar region without mention of lumbar back pain or lower extremity pain: Secondary | ICD-10-CM

## 2024-06-13 DIAGNOSIS — R198 Other specified symptoms and signs involving the digestive system and abdomen: Secondary | ICD-10-CM

## 2024-06-13 DIAGNOSIS — F32A Depression, unspecified: Secondary | ICD-10-CM

## 2024-06-13 MED ORDER — LIDOCAINE 5 % EX PTCH
1.0000 | MEDICATED_PATCH | CUTANEOUS | 2 refills | Status: AC
Start: 2024-06-13 — End: ?

## 2024-06-13 MED ORDER — SERTRALINE HCL 25 MG PO TABS: 25.0000 mg | ORAL_TABLET | Freq: Every day | ORAL | 3 refills | Status: DC

## 2024-06-13 NOTE — Telephone Encounter (Signed)
 SENT

## 2024-06-14 ENCOUNTER — Other Ambulatory Visit (INDEPENDENT_AMBULATORY_CARE_PROVIDER_SITE_OTHER): Payer: MEDICAID

## 2024-06-14 ENCOUNTER — Ambulatory Visit (INDEPENDENT_AMBULATORY_CARE_PROVIDER_SITE_OTHER): Payer: MEDICAID | Admitting: Orthopedic Surgery

## 2024-06-14 VITALS — BP 145/82 | HR 103 | Ht 63.0 in | Wt 199.0 lb

## 2024-06-14 DIAGNOSIS — M545 Low back pain, unspecified: Secondary | ICD-10-CM
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
## 2024-06-15 ENCOUNTER — Ambulatory Visit (HOSPITAL_COMMUNITY): Payer: MEDICAID | Admitting: Occupational Therapy

## 2024-06-15 ENCOUNTER — Encounter (HOSPITAL_COMMUNITY): Payer: Self-pay | Admitting: Occupational Therapy

## 2024-06-15 DIAGNOSIS — M6281 Muscle weakness (generalized): Secondary | ICD-10-CM | POA: Diagnosis not present

## 2024-06-15 DIAGNOSIS — R29898 Other symptoms and signs involving the musculoskeletal system: Secondary | ICD-10-CM

## 2024-06-15 DIAGNOSIS — M25631 Stiffness of right wrist, not elsewhere classified: Secondary | ICD-10-CM

## 2024-06-15 DIAGNOSIS — M25531 Pain in right wrist: Secondary | ICD-10-CM

## 2024-06-15 NOTE — Therapy (Signed)
 OUTPATIENT OCCUPATIONAL THERAPY ORTHO EVALUATION  Patient Name: Heidi [name] MRN: [id number] DOB:04-14-1971, 53 y.o., female Today's Date: 06/18/2024  PCP: Terry Wilhelmena Lloyd Hilario, FNP REFERRING PROVIDER: Terry Wilhelmena Lloyd Hilario, FNP  END OF SESSION:  OT End of Session - 06/18/24 2027     Visit Number 1    Number of Visits 5    Date for OT Re-Evaluation 07/14/24    Authorization Type [employer]    Authorization Time Period Requesting 4 visits    OT Start Time 1521    OT Stop Time 1546    OT Time Calculation (min) 25 min    Activity Tolerance Patient tolerated treatment well    Behavior During Therapy [facility] for tasks assessed/performed          Past Medical History:  Diagnosis Date   Alcohol withdrawal (HCC)    Anxiety    Cervical radiculopathy    Depression    Diabetes mellitus without complication (HCC)    GERD (gastroesophageal reflux disease)    Hypertension    Past Surgical History:  Procedure Laterality Date   BIOPSY  11/30/2023   Procedure: BIOPSY;  Surgeon: Cindie Carlin POUR, DO;  [facility];  Service: Endoscopy;;   BRAIN SURGERY     COLONOSCOPY WITH PROPOFOL  N/A 11/30/2023   Surgeon: Cindie Carlin K, DO;   5 mm polyp resected and retrieved, stool throughout the entire colon.  Pathology with tubular adenoma.  Recommended 5-year surveillance.   ESOPHAGOGASTRODUODENOSCOPY (EGD) WITH PROPOFOL  N/A 11/30/2023   Surgeon: Cindie Carlin POUR, DO;  Small hiatal hernia, gastritis biopsied, duodenitis.  Pathology with reactive gastropathy and focal erosion, negative for H. pylori. Recommended PPI BID.   FLEXIBLE SIGMOIDOSCOPY N/A 07/13/2023   Procedure: FLEXIBLE SIGMOIDOSCOPY;  Surgeon: Eartha Angelia Sieving, MD;  Location: [facility];  Service: Gastroenterology;  Laterality: N/A;   POLYPECTOMY  11/30/2023   Procedure: POLYPECTOMY;  Surgeon: Cindie Carlin POUR, DO;  Location: [facility];  Service: Endoscopy;;   Patient Active Problem List    Diagnosis Date Noted   Type 2 diabetes mellitus (HCC) 06/16/2024   Dysphagia 05/24/2024   Anxiety and depression 05/24/2024   Spinal stenosis of lumbar region with neurogenic claudication 05/11/2024   DOE (dyspnea on exertion) 04/17/2024   Former cigarette smoker 04/17/2024   Chest pain 02/08/2024   Lumbar facet arthropathy 12/28/2023   Chronic pain syndrome 12/28/2023   Cervical cancer screening 10/26/2023   Degenerative disc disease, lumbar 08/11/2023   Hospital discharge follow-up 07/29/2023   Laceration of spleen 07/24/2023   Ileus (HCC) 07/13/2023   Elevated LFTs 07/12/2023   Colonic obstruction (HCC) 07/12/2023   Hypothermia 07/09/2023   Hypokalemia 07/06/2023   Thrombocytopenia (HCC) 07/06/2023   Chronic back pain 05/18/2023   Bilateral knee pain 04/26/2023   Depression, recurrent (HCC) 04/26/2023   Alcohol use disorder 04/20/2023   Carpal tunnel syndrome of right wrist 04/20/2023   Tachycardia 04/20/2023   Transaminitis 04/06/2023   GERD (gastroesophageal reflux disease) 04/06/2023   Tobacco use disorder 04/06/2023   Alcohol withdrawal (HCC) 10/19/2022   Essential hypertension 10/19/2022   Nausea & vomiting 10/19/2022   Hypomagnesemia 10/19/2022   Cervical radicular pain 10/29/2010    ONSET DATE: 04/20/23  REFERRING DIAG: R Carpal Tunnel  THERAPY DIAG:  Pain in right wrist  Stiffness of right wrist, not elsewhere classified  Other symptoms and signs involving the musculoskeletal system  Rationale for Evaluation and Treatment: Rehabilitation  SUBJECTIVE:   SUBJECTIVE STATEMENT: It's been a  long time Pt accompanied by: self  PERTINENT HISTORY: PMH significant for DM2, Lumbar pain, HTN  PRECAUTIONS: None  WEIGHT BEARING RESTRICTIONS: No  PAIN:  Are you having pain? No  FALLS: Has patient fallen in last 6 months? No  PATIENT GOALS: To decrease pain  NEXT MD VISIT: Unsure  OBJECTIVE:  Note: Objective measures were completed at Evaluation  unless otherwise noted.  HAND DOMINANCE: Right  ADLs: Overall ADLs: Pt has difficulty holding on to objects, especially with cooking/meal prep and cleaning  FUNCTIONAL OUTCOME MEASURES: Upper Extremity Functional Scale (UEFS): 53/80 -63.3%  UPPER EXTREMITY ROM:     Active ROM Right eval  Wrist flexion 22  Wrist extension 52  Wrist ulnar deviation 34  Wrist radial deviation 8  Wrist pronation WFL  Wrist supination WFL  (Blank rows = not tested)    Pinky unable to flex, all other fingers WFL   Full fist 80%  UPPER EXTREMITY MMT:     MMT Right eval  Wrist flexion 4/5  Wrist extension 4/5  Wrist ulnar deviation 4-/5  Wrist radial deviation 4-/5  Wrist pronation 4+/5  Wrist supination 4+/5  (Blank rows = not tested)  HAND FUNCTION: Grip strength: Right: 22 lbs; Left: 68 lbs, Lateral pinch: Right: 9 lbs, Left: 9 lbs, and 3 point pinch: Right: 12 lbs, Left: 10 lbs  COORDINATION: 9 Hole Peg test: Right: 28.03 sec; Left: 30.56 sec  SENSATION: WFL  EDEMA: Pt reports swelling on and off in the MCP knuckles  OBSERVATIONS: Stiffness along wrist and MCP's   TREATMENT DATE:   06/15/24 -Digit ROM: composite flexion, abduction, extension, opposition, x10 -Wrist ROM: flexion, extension, ulnar/radial deviation, supination/pronation, x10                                                                                                                                 PATIENT EDUCATION: Education details: Digit and Wrist ROM Person educated: Patient Education method: Explanation, Demonstration, and Handouts Education comprehension: verbalized understanding  HOME EXERCISE PROGRAM: 6/26: Digit and Wrist ROM  GOALS: Goals reviewed with patient? Yes  SHORT TERM GOALS: Target date: 07/14/25  Pt will be provided and educated on HEP for RUE in order to complete independent ADL's.   Goal status: INITIAL  2.  Pt will decrease pain in RUE to 3/10 or less in order to complete  writing and typing with no pain.  Goal status: INITIAL  3.  Pt will increase strength in RUE to 4+/5 in order to grasp and carry items needed during dressing and bathing.  Goal status: INITIAL  4.  Pt will increase grip strength in RUE by 15# and pinch strength by 2# in order to grasp and manipulate items during cooking and cleaning.  Goal status: INITIAL   ASSESSMENT:  CLINICAL IMPRESSION: Patient is a 53 y.o. female who was seen today for occupational therapy evaluation for R carpal tunnel. Pt demonstrated increased pain and stiffness, as  well as decreased strength in the wrist and grip. Due to pain and weakness, she has difficulty with BADL's and IADL's such as cooking, cleaning, typing, and writing.    PERFORMANCE DEFICITS: in functional skills including ADLs, IADLs, coordination, edema, ROM, strength, pain, fascial restrictions, Fine motor control, Gross motor control, body mechanics, and UE functional use.  IMPAIRMENTS: are limiting patient from ADLs, IADLs, rest and sleep, work, leisure, and social participation.   COMORBIDITIES: has no other co-morbidities that affects occupational performance. Patient will benefit from skilled OT to address above impairments and improve overall function.  MODIFICATION OR ASSISTANCE TO COMPLETE EVALUATION: No modification of tasks or assist necessary to complete an evaluation.  OT OCCUPATIONAL PROFILE AND HISTORY: Problem focused assessment: Including review of records relating to presenting problem.  CLINICAL DECISION MAKING: LOW - limited treatment options, no task modification necessary  REHAB POTENTIAL: Good  EVALUATION COMPLEXITY: Low      PLAN:  OT FREQUENCY: 1x/week  OT DURATION: 4 weeks  PLANNED INTERVENTIONS: 97168 OT Re-evaluation, 97535 self care/ADL training, 02889 therapeutic exercise, 97530 therapeutic activity, 97140 manual therapy, 97035 ultrasound, 97018 paraffin, 02989 moist heat, 97032 electrical stimulation  (manual), passive range of motion, functional mobility training, energy conservation, coping strategies training, patient/family education, and DME and/or AE instructions  RECOMMENDED OTHER SERVICES: N/A  CONSULTED AND AGREED WITH PLAN OF CARE: Patient  PLAN FOR NEXT SESSION: Trial Paraffin, manual, Wrist ROM, Digit ROM, grip strengthening   Heidi [name] [name] [phone] Kestrel Mis Jillyn [name], OT 06/18/2024, 8:31 PM   Managed Medicaid Authorization Request  Visit Dx Codes: M25.531, M25.631, R29.898  Functional Tool Score: 53/80 - 63.3%  For all possible CPT codes, reference the Planned Interventions line above.     Check all conditions that are expected to impact treatment: {Conditions expected to impact treatment:None of these apply   If treatment provided at initial evaluation, no treatment charged due to lack of authorization.

## 2024-06-15 NOTE — Patient Instructions (Signed)
 AROM Exercises   1) Wrist Flexion  Start with wrist at edge of table, palm facing up. With wrist hanging slightly off table, curl wrist upward, and back down.      2) Wrist Extension  Start with wrist at edge of table, palm facing down. With wrist slightly off the edge of the table, curl wrist up and back down.      3) Radial Deviations  Start with forearm flat against a table, wrist hanging slightly off the edge, and palm facing the wall. Bending at the wrist only, and keeping palm facing the wall, bend wrist so fist is pointing towards the floor, back up to start position, and up towards the ceiling. Return to start.        4) WRIST PRONATION  Turn your forearm towards palm face down.  Keep your elbow bent and by the side of your  Body.      5) WRIST SUPINATION  Turn your forearm towards palm face up.  Keep your elbow bent and by the side of your  Body.      *Complete exercises ______ times each, _______ times per day*  Complete each exercise 10-15X, 2-3X/day  1) Towel crunch Place a small towel on a firm table top. Flatten out the towel and then place your hand on one end of it.  Next, flex your fingers 2-5 (index finger through pinky finger) as you pull the towel towards your hand.    2) Digit composite flexion/adduction (make a fist) Hold your hand up as shown. Open and close your hand into a fist and repeat. If you cannot make a full fist, then make a partial fist.    3) Thumb/finger opposition Touch the tip of the thumb to each fingertip one by one. Extend fingers fully after they are touched.      4) Finger Taps Start with the hand flat and fingers slightly spread.  One at a time, starting with the thumb, lift each finger up separately.    5) PIP Joint Blocking Grasp the affected finger, bracing below the middle knuckle, and actively bend the finger as shown.    6) DIP Joint Blocking Grasp the affected finger, bracing below the  last knuckle, and actively bend the finger at the last joint.     7) Digit Abduction/Adduction Hold hand palm down flat on table. Spread your fingers apart and back together.

## 2024-06-16 ENCOUNTER — Ambulatory Visit (INDEPENDENT_AMBULATORY_CARE_PROVIDER_SITE_OTHER): Payer: MEDICAID | Admitting: Family Medicine

## 2024-06-16 ENCOUNTER — Other Ambulatory Visit: Payer: Self-pay | Admitting: Family Medicine

## 2024-06-16 VITALS — BP 128/86 | HR 85 | Wt 197.1 lb

## 2024-06-16 DIAGNOSIS — E782 Mixed hyperlipidemia: Secondary | ICD-10-CM

## 2024-06-16 DIAGNOSIS — E119 Type 2 diabetes mellitus without complications: Secondary | ICD-10-CM | POA: Insufficient documentation

## 2024-06-16 DIAGNOSIS — I10 Essential (primary) hypertension: Secondary | ICD-10-CM | POA: Diagnosis not present

## 2024-06-16 DIAGNOSIS — M48062 Spinal stenosis, lumbar region with neurogenic claudication: Secondary | ICD-10-CM

## 2024-06-16 DIAGNOSIS — M48061 Spinal stenosis, lumbar region without neurogenic claudication: Secondary | ICD-10-CM | POA: Diagnosis not present

## 2024-06-16 DIAGNOSIS — Z7984 Long term (current) use of oral hypoglycemic drugs: Secondary | ICD-10-CM

## 2024-06-16 MED ORDER — IBUPROFEN 800 MG PO TABS: 800.0000 mg | ORAL_TABLET | Freq: Two times a day (BID) | ORAL | 2 refills | Status: DC | PRN

## 2024-06-16 MED ORDER — PREGABALIN 50 MG PO CAPS: 50.0000 mg | ORAL_CAPSULE | Freq: Two times a day (BID) | ORAL | 0 refills | Status: DC | PRN

## 2024-06-16 MED ORDER — BUSPIRONE HCL 30 MG PO TABS: 30.0000 mg | ORAL_TABLET | Freq: Two times a day (BID) | ORAL | 3 refills | Status: DC

## 2024-06-16 NOTE — Progress Notes (Signed)
 Established Patient Office Visit   Subjective  Patient ID: Heidi [name], female    DOB: Aug 24, 1971  Age: 53 y.o. MRN: [id number]  Chief Complaint  Patient presents with   Diabetes   Anxiety    Would like too have anxiety medication increased   Medication Refill    Ibuprofen     She  has a past medical history of Alcohol withdrawal (HCC), Anxiety, Cervical radiculopathy, Depression, Diabetes mellitus without complication (HCC), GERD (gastroesophageal reflux disease), and Hypertension.  Diabetes She presents for her follow-up diabetic visit. She has type 2 diabetes mellitus. Her disease course has been fluctuating. Hypoglycemia symptoms include headaches. Pertinent negatives for hypoglycemia include no sweats or tremors. Pertinent negatives for diabetes include no chest pain, no foot ulcerations, no polydipsia and no polyphagia. Pertinent negatives for hypoglycemia complications include no blackouts. Diabetic complications include heart disease and peripheral neuropathy. Risk factors for coronary artery disease include diabetes mellitus, dyslipidemia, obesity, hypertension and sedentary lifestyle. Current diabetic treatment includes oral agent (monotherapy). She is following a diabetic and generally healthy diet. Meal planning includes avoidance of concentrated sweets. She rarely participates in exercise. An ACE inhibitor/angiotensin II receptor blocker is being taken. She does not see a podiatrist.Eye exam is current.    Review of Systems  Constitutional:  Negative for chills and fever.  Respiratory:  Negative for shortness of breath.   Cardiovascular:  Negative for chest pain.  Neurological:  Positive for headaches. Negative for tremors.  Endo/Heme/Allergies:  Negative for polydipsia and polyphagia.      Objective:     BP 128/86 (BP Location: Left Arm, Patient Position: Sitting, Cuff Size: Large)   Pulse 85   Wt 197 lb 1.9 oz (89.4 kg)   SpO2 95%   BMI 34.92 kg/m  BP Readings  from Last 3 Encounters:  06/16/24 128/86  06/14/24 (!) 145/82  06/06/24 124/78      Physical Exam Vitals reviewed.  Constitutional:      General: She is not in acute distress.    Appearance: Normal appearance. She is not ill-appearing, toxic-appearing or diaphoretic.  HENT:     Head: Normocephalic.   Eyes:     General:        Right eye: No discharge.        Left eye: No discharge.     Conjunctiva/sclera: Conjunctivae normal.    Cardiovascular:     Rate and Rhythm: Normal rate.     Pulses: Normal pulses.     Heart sounds: Normal heart sounds.  Pulmonary:     Effort: Pulmonary effort is normal. No respiratory distress.     Breath sounds: Normal breath sounds.  Abdominal:     General: Bowel sounds are normal.   Musculoskeletal:     Thoracic back: Decreased range of motion.     Lumbar back: Spasms and tenderness present. Decreased range of motion.   Skin:    General: Skin is warm and dry.     Capillary Refill: Capillary refill takes less than 2 seconds.   Neurological:     Mental Status: She is alert.     Coordination: Coordination abnormal.     Gait: Gait abnormal.   Psychiatric:        Mood and Affect: Mood normal.        Behavior: Behavior normal.      No results found for any visits on 06/16/24.  The 10-year ASCVD risk score (Arnett DK, et al., 2019) is: 4.7%    Assessment &  Plan:  Type 2 diabetes mellitus without complication, without long-term current use of insulin  (HCC) Assessment & Plan: Last Hemoglobin A1c: 7.3 Labs: Ordered today, results pending; will follow up accordingly. The patient reports adhering to prescribed medications: Metformin  500 mg twice daily  Reviewed non-pharmacological interventions, including a balanced diet rich in lean proteins, healthy fats, whole grains, and high-fiber vegetables. Emphasized reducing refined sugars and processed carbohydrates, and incorporating more fruits, leafy greens, and legumes. Education: Patient  was educated on recognizing signs and symptoms of both hypoglycemia and hyperglycemia, and advised to seek emergency care if these symptoms occur. Follow-Up: Scheduled for follow-up in 3-4 months, or sooner if needed. Patient Understanding: The patient verbalized understanding of the care plan, and all questions were answered. Additional Care: Ophthalmology referral was placed. Podiatry   Orders: -     Hemoglobin A1c -     Microalbumin / creatinine urine ratio -     Ambulatory referral to Podiatry -     Ambulatory referral to Ophthalmology  Primary hypertension -     CMP14+EGFR -     CBC with Differential/Platelet  Mixed hyperlipidemia -     Lipid panel  Lumbar foraminal stenosis -     Pregabalin; Take 1 capsule (50 mg total) by mouth 2 (two) times daily as needed.  Dispense: 60 capsule; Refill: 0  Spinal stenosis of lumbar region with neurogenic claudication Assessment & Plan: Trial on Lyrica 50 mg twice daily. Continue physical therapy  Continue regular low-impact exercises like walking or swimming can help maintain mobility and reduce pain. Maintaining a healthy weight and using proper posture can also alleviate pressure on the spine   Other orders -     busPIRone  HCl; Take 1 tablet (30 mg total) by mouth in the morning and at bedtime.  Dispense: 60 tablet; Refill: 3    Return in about 4 months (around 10/16/2024), or if symptoms worsen or fail to improve, for hyperlipidemia, type 2 diabetes, hypertension.   Hilario Kidd Wilhelmena Falter, FNP

## 2024-06-16 NOTE — Assessment & Plan Note (Signed)
 Trial on Lyrica 50 mg twice daily. Continue physical therapy  Continue regular low-impact exercises like walking or swimming can help maintain mobility and reduce pain. Maintaining a healthy weight and using proper posture can also alleviate pressure on the spine

## 2024-06-16 NOTE — Patient Instructions (Signed)

## 2024-06-16 NOTE — Assessment & Plan Note (Signed)
 Last Hemoglobin A1c: 7.3 Labs: Ordered today, results pending; will follow up accordingly. The patient reports adhering to prescribed medications: Metformin  500 mg twice daily  Reviewed non-pharmacological interventions, including a balanced diet rich in lean proteins, healthy fats, whole grains, and high-fiber vegetables. Emphasized reducing refined sugars and processed carbohydrates, and incorporating more fruits, leafy greens, and legumes. Education: Patient was educated on recognizing signs and symptoms of both hypoglycemia and hyperglycemia, and advised to seek emergency care if these symptoms occur. Follow-Up: Scheduled for follow-up in 3-4 months, or sooner if needed. Patient Understanding: The patient verbalized understanding of the care plan, and all questions were answered. Additional Care: Ophthalmology referral was placed. Podiatry

## 2024-06-17 NOTE — Progress Notes (Unsigned)
 Referring Provider: Del Orbe Polanco, Ilian* Primary Care Physician:  Terry Wilhelmena Lloyd Hilario, FNP Primary GI Physician: Dr. Cindie  No chief complaint on file.   HPI:   Heidi [name] is a 53 y.o. female presenting today with a history of with history of hypertension, alcohol abuse, elevated LFTs, anemia, peritoneal nodularity and possible splenic laceration versus infarct on CT in July 2024 following flexible sigmoidoscopy for ileus, adenomatous colon polyp, constipation, GERD, presenting today for follow-up ***  Anemia: - Previously noted decline in hemoglobin from 8.5-12 during hospitalization in July 2024 with no overt GI bleeding.  - EGD and colonoscopy in December 2024 showing small hiatal hernia, gastritis, duodenitis, biopsies negative for H. pylori, tubular adenoma removed from the colon, stool throughout the entire colon.   - Gastritis/duodenitis likely secondary to excessive amounts of NSAIDs.  - Oral iron discontinued in January 2025 due to elevated iron saturation.  Hemoglobin was stable at 11.6.  History of elevated LFTs/cirrhosis: - LFTs normalized following alcohol cessation in July 2024. -Most recent labs 06/16/2024 with LFTs and platelets within normal limits. - Needs INR updated for MELD calculation.  - CT A/P with contrast 02/08/2024 with no focal liver lesion.  AFP: *** Hep A/B vaccination:***  Ascites/peripheral edema:  Diuretics:  Paracentesis: Never Encephalopathy:      Constipation:   GERD:   Hypokalemia: Potassium 2.8 on 06/16/2024.***    Abnormal CT abdomen/peritoneal nodularity :  - CT A/P without contrast 07/12/2023 with moderate amount of low-density ascites with soft tissue stranding throughout the mesenteric fat and possible peritoneal nodularity in the right mid abdomen. Radiologist stated cannot exclude peritoneal carcinomatosis.  - CT A/P with contrast on 7/28 and 8/1 did not redemonstrate any of these findings.  - Repeat CT in  February 2025 with no peritoneal nodularity.   Splenic lesion:  - Possible splenic laceration or infarct noted on CT following flexible sigmoidoscopy in July 2024. - CT A/P with contrast February 2025 with largely resolved sequela of prior splenic laceration/infarctions.     EGD 11/30/2023: Small hiatal hernia, gastritis biopsied, duodenitis.  Pathology with reactive gastropathy and focal erosion, negative for H. pylori. Recommended PPI BID.    Colonoscopy 11/30/2023: 5 mm polyp resected and retrieved, stool throughout the entire colon.  Pathology with tubular adenoma.  Recommended 5-year surveillance.  Past Medical History:  Diagnosis Date   Alcohol withdrawal (HCC)    Anxiety    Cervical radiculopathy    Depression    Diabetes mellitus without complication (HCC)    GERD (gastroesophageal reflux disease)    Hypertension     Past Surgical History:  Procedure Laterality Date   BIOPSY  11/30/2023   Procedure: BIOPSY;  Surgeon: Cindie Carlin POUR, DO;  [facility]: [facility];  Service: Endoscopy;;   BRAIN SURGERY     COLONOSCOPY WITH PROPOFOL  N/A 11/30/2023   Surgeon: Cindie Carlin K, DO;   5 mm polyp resected and retrieved, stool throughout the entire colon.  Pathology with tubular adenoma.  Recommended 5-year surveillance.   ESOPHAGOGASTRODUODENOSCOPY (EGD) WITH PROPOFOL  N/A 11/30/2023   Surgeon: Cindie Carlin POUR, DO;  Small hiatal hernia, gastritis biopsied, duodenitis.  Pathology with reactive gastropathy and focal erosion, negative for H. pylori. Recommended PPI BID.   FLEXIBLE SIGMOIDOSCOPY N/A 07/13/2023   Procedure: FLEXIBLE SIGMOIDOSCOPY;  Surgeon: Eartha Angelia Sieving, MD;  Location: [facility];  Service: Gastroenterology;  Laterality: N/A;   POLYPECTOMY  11/30/2023   Procedure: POLYPECTOMY;  Surgeon: Cindie Carlin POUR, DO;  Location: [facility];  Service: Endoscopy;;    Current Outpatient Medications  Medication Sig Dispense Refill   Accu-Chek Softclix  Lancets lancets 100 each by Other route 3 (three) times daily. 100 each 3   albuterol  (PROVENTIL ) (2.5 MG/3ML) 0.083% nebulizer solution Take 3 mLs (2.5 mg total) by nebulization every 6 (six) hours as needed for wheezing or shortness of breath. 150 mL 1   albuterol  (VENTOLIN  HFA) 108 (90 Base) MCG/ACT inhaler Inhale 2 puffs into the lungs every 6 (six) hours as needed for wheezing or shortness of breath. 8 g 2   Blood Glucose Monitoring Suppl DEVI 1 each by Does not apply route in the morning, at noon, and at bedtime. May substitute to any manufacturer covered by patient's insurance. 1 each 0   Blood Pressure Monitor DEVI 1 each by Does not apply route daily. 1 each 0   busPIRone  (BUSPAR ) 30 MG tablet Take 1 tablet (30 mg total) by mouth in the morning and at bedtime. 60 tablet 3   cloNIDine  (CATAPRES  - DOSED IN MG/24 HR) 0.2 mg/24hr patch APPLY 1 PATCH TO THE SKIN ONCE WEEKLY. 4 patch 4   cyclobenzaprine  (FLEXERIL ) 10 MG tablet Take 1 tablet (10 mg total) by mouth 2 (two) times daily as needed for muscle spasms. 40 tablet 1   ferrous sulfate 325 (65 FE) MG tablet Take 325 mg by mouth daily with breakfast.     folic acid  (FOLVITE ) 1 MG tablet Take 1 tablet (1 mg total) by mouth daily. 30 tablet 3   furosemide  (LASIX ) 20 MG tablet Take 1 tablet (20 mg total) by mouth daily. 90 tablet 1   gabapentin  (NEURONTIN ) 400 MG capsule TAKE ONE CAPSULE BY MOUTH THREE TIMES DAILY 60 capsule 5   glucose blood (ACCU-CHEK GUIDE TEST) test strip Use as instructed three times daily dx e11.65 100 each 3   HYDROcodone -acetaminophen  (NORCO/VICODIN) 5-325 MG tablet Take 1 tablet by mouth every 4 (four) hours as needed.     ibuprofen  (ADVIL ) 800 MG tablet Take 1 tablet (800 mg total) by mouth every 12 (twelve) hours as needed. 30 tablet 2   levocetirizine (XYZAL ) 5 MG tablet Take 1 tablet (5 mg total) by mouth every evening. 30 tablet 5   lidocaine  (LIDODERM ) 5 % Place 1 patch onto the skin daily. Remove & Discard patch  within 12 hours or as directed by MD 30 patch 2   lidocaine  (XYLOCAINE ) 2 % solution Use as directed 15 mLs in the mouth or throat as needed. 100 mL 0   metFORMIN  (GLUCOPHAGE ) 500 MG tablet Take 1 tablet (500 mg total) by mouth 2 (two) times daily with a meal. 90 tablet 3   metoprolol  tartrate (LOPRESSOR ) 25 MG tablet Take 0.5 tablets (12.5 mg total) by mouth 2 (two) times daily. 60 tablet 5   Misc. Devices (GOJJI WEIGHT SCALE) MISC Daily Weight 1 each 0   naltrexone  (DEPADE) 50 MG tablet Take 1 tablet (50 mg total) by mouth daily. 30 tablet 2   olmesartan  (BENICAR ) 20 MG tablet Take 1 tablet (20 mg total) by mouth daily. 30 tablet 6   ondansetron  (ZOFRAN ) 4 MG tablet Take 1 tablet (4 mg total) by mouth every 8 (eight) hours as needed for nausea or vomiting. 20 tablet 3   pantoprazole  (PROTONIX ) 40 MG tablet Take 1 tablet (40 mg total) by mouth 2 (two) times daily. 60 tablet 11   polyethylene glycol powder (GLYCOLAX /MIRALAX ) 17 GM/SCOOP powder Take 17 g by mouth daily.  Mix with 8 oz of water . 510 g 3   potassium chloride  (KLOR-CON ) 10 MEQ tablet Take 10 mEq by mouth daily.     predniSONE  (DELTASONE ) 10 MG tablet Take 1 tablet (10 mg total) by mouth 3 (three) times daily. 42 tablet 0   pregabalin (LYRICA) 50 MG capsule Take 1 capsule (50 mg total) by mouth 2 (two) times daily as needed. 60 capsule 0   rosuvastatin  (CRESTOR ) 40 MG tablet Take 1 tablet (40 mg total) by mouth daily. 90 tablet 1   sertraline  (ZOLOFT ) 25 MG tablet Take 1 tablet (25 mg total) by mouth daily. 30 tablet 3   varenicline  (CHANTIX ) 0.5 MG tablet Take 1 tablet (0.5 mg total) by mouth 2 (two) times daily. 60 tablet 0   No current facility-administered medications for this visit.    Allergies as of 06/19/2024   (No Known Allergies)    Family History  Problem Relation Age of Onset   Stroke Father     Social History   Socioeconomic History   Marital status: Widowed    Spouse name: Not on file   Number of children:  Not on file   Years of education: Not on file   Highest education level: Not on file  Occupational History   Not on file  Tobacco Use   Smoking status: Former    Current packs/day: 0.50    Average packs/day: 0.5 packs/day for 38.5 years (19.2 ttl pk-yrs)    Types: Cigarettes    Start date: 37   Smokeless tobacco: Never  Vaping Use   Vaping status: Never Used  Substance and Sexual Activity   Alcohol use: Not Currently    Alcohol/week: 42.0 standard drinks of alcohol    Types: 42 Cans of beer per week    Comment: 6 pack of beer daily. No alcohol since 05/05/23.   Drug use: No   Sexual activity: Yes  Other Topics Concern   Not on file  Social History Narrative   Not on file   Social Drivers of Health   Financial Resource Strain: Not on file  Food Insecurity: No Food Insecurity (07/05/2023)   Hunger Vital Sign    Worried About Running Out of Food in the Last Year: Never true    Ran Out of Food in the Last Year: Never true  Transportation Needs: No Transportation Needs (07/05/2023)   PRAPARE - Administrator, Civil Service (Medical): No    Lack of Transportation (Non-Medical): No  Physical Activity: Not on file  Stress: Not on file  Social Connections: Not on file    Review of Systems: Gen: Denies fever, chills, anorexia. Denies fatigue, weakness, weight loss.  CV: Denies chest pain, palpitations, syncope, peripheral edema, and claudication. Resp: Denies dyspnea at rest, cough, wheezing, coughing up blood, and pleurisy. GI: Denies vomiting blood, jaundice, and fecal incontinence.   Denies dysphagia or odynophagia. Derm: Denies rash, itching, dry skin Psych: Denies depression, anxiety, memory loss, confusion. No homicidal or suicidal ideation.  Heme: Denies bruising, bleeding, and enlarged lymph nodes.  Physical Exam: There were no vitals taken for this visit. General:   Alert and oriented. No distress noted. Pleasant and cooperative.  Head:  Normocephalic  and atraumatic. Eyes:  Conjuctiva clear without scleral icterus. Heart:  S1, S2 present without murmurs appreciated. Lungs:  Clear to auscultation bilaterally. No wheezes, rales, or rhonchi. No distress.  Abdomen:  +BS, soft, non-tender and non-distended. No rebound or guarding. No HSM or masses noted.  Msk:  Symmetrical without gross deformities. Normal posture. Extremities:  Without edema. Neurologic:  Alert and  oriented x4 Psych:  Normal mood and affect.    Assessment:     Plan:  ***   Josette Centers, PA-C [facility] Gastroenterology 06/19/2024

## 2024-06-18 LAB — CBC WITH DIFFERENTIAL/PLATELET
Basophils Absolute: 0.1 10*3/uL (ref 0.0–0.2)
Basos: 0 %
EOS (ABSOLUTE): 0.1 10*3/uL (ref 0.0–0.4)
Eos: 1 %
Hematocrit: 38.1 % (ref 34.0–46.6)
Hemoglobin: 12.3 g/dL (ref 11.1–15.9)
Immature Grans (Abs): 0.1 10*3/uL (ref 0.0–0.1)
Immature Granulocytes: 1 %
Lymphocytes Absolute: 2 10*3/uL (ref 0.7–3.1)
Lymphs: 15 %
MCH: 28 pg (ref 26.6–33.0)
MCHC: 32.3 g/dL (ref 31.5–35.7)
MCV: 87 fL (ref 79–97)
Monocytes Absolute: 0.9 10*3/uL (ref 0.1–0.9)
Monocytes: 7 %
Neutrophils Absolute: 10.5 10*3/uL — ABNORMAL HIGH (ref 1.4–7.0)
Neutrophils: 76 %
Platelets: 357 10*3/uL (ref 150–450)
RBC: 4.4 x10E6/uL (ref 3.77–5.28)
RDW: 14.6 % (ref 11.7–15.4)
WBC: 13.7 10*3/uL — ABNORMAL HIGH (ref 3.4–10.8)

## 2024-06-18 LAB — CMP14+EGFR
ALT: 10 IU/L (ref 0–32)
AST: 16 IU/L (ref 0–40)
Albumin: 4.6 g/dL (ref 3.9–4.9)
Alkaline Phosphatase: 113 IU/L (ref 44–121)
BUN/Creatinine Ratio: 14 (ref 9–23)
BUN: 14 mg/dL (ref 6–24)
Bilirubin Total: 0.3 mg/dL (ref 0.0–1.2)
CO2: 22 mmol/L (ref 20–29)
Calcium: 10.3 mg/dL — ABNORMAL HIGH (ref 8.7–10.2)
Chloride: 105 mmol/L (ref 96–106)
Creatinine, Ser: 1.03 mg/dL — ABNORMAL HIGH (ref 0.57–1.00)
Globulin, Total: 2.2 g/dL (ref 1.5–4.5)
Glucose: 111 mg/dL — ABNORMAL HIGH (ref 70–99)
Potassium: 2.8 mmol/L — ABNORMAL LOW (ref 3.5–5.2)
Sodium: 145 mmol/L — ABNORMAL HIGH (ref 134–144)
Total Protein: 6.8 g/dL (ref 6.0–8.5)
eGFR: 66 mL/min/{1.73_m2} (ref >59)

## 2024-06-18 LAB — MICROALBUMIN / CREATININE URINE RATIO
Creatinine, Urine: 88.5 mg/dL
Microalb/Creat Ratio: 109 mg/g{creat} — AB (ref 0–29)
Microalbumin, Urine: 96.7 ug/mL

## 2024-06-18 LAB — HEMOGLOBIN A1C
Est. average glucose Bld gHb Est-mCnc: 143 mg/dL
Hgb A1c MFr Bld: 6.6 % — ABNORMAL HIGH (ref 4.8–5.6)

## 2024-06-18 LAB — LIPID PANEL
Chol/HDL Ratio: 2.4 ratio (ref 0.0–4.4)
Cholesterol, Total: 147 mg/dL (ref 100–199)
HDL: 62 mg/dL (ref >39)
LDL Chol Calc (NIH): 64 mg/dL (ref 0–99)
Triglycerides: 116 mg/dL (ref 0–149)
VLDL Cholesterol Cal: 21 mg/dL (ref 5–40)

## 2024-06-19 ENCOUNTER — Telehealth: Payer: Self-pay | Admitting: *Deleted

## 2024-06-19 ENCOUNTER — Other Ambulatory Visit: Payer: Self-pay | Admitting: *Deleted

## 2024-06-19 ENCOUNTER — Ambulatory Visit (INDEPENDENT_AMBULATORY_CARE_PROVIDER_SITE_OTHER): Payer: MEDICAID | Admitting: Gastroenterology

## 2024-06-19 ENCOUNTER — Telehealth: Payer: Self-pay

## 2024-06-19 VITALS — BP 119/76 | HR 98 | Temp 98.6°F | Ht 63.0 in | Wt 197.0 lb

## 2024-06-19 DIAGNOSIS — K7460 Unspecified cirrhosis of liver: Secondary | ICD-10-CM

## 2024-06-19 DIAGNOSIS — R197 Diarrhea, unspecified: Secondary | ICD-10-CM | POA: Diagnosis not present

## 2024-06-19 DIAGNOSIS — K5900 Constipation, unspecified: Secondary | ICD-10-CM

## 2024-06-19 DIAGNOSIS — D649 Anemia, unspecified: Secondary | ICD-10-CM

## 2024-06-19 DIAGNOSIS — E876 Hypokalemia: Secondary | ICD-10-CM

## 2024-06-19 DIAGNOSIS — K760 Fatty (change of) liver, not elsewhere classified: Secondary | ICD-10-CM

## 2024-06-19 DIAGNOSIS — R1310 Dysphagia, unspecified: Secondary | ICD-10-CM

## 2024-06-19 DIAGNOSIS — Z8719 Personal history of other diseases of the digestive system: Secondary | ICD-10-CM

## 2024-06-19 MED ORDER — POTASSIUM CHLORIDE CRYS ER 20 MEQ PO TBCR: 40.0000 meq | EXTENDED_RELEASE_TABLET | Freq: Every day | ORAL | 0 refills | Status: DC

## 2024-06-19 NOTE — Patient Instructions (Addendum)
 Take potassium 40 meq daily for 4 days.  You will need to follow-up with your primary care doctor within the week to have your potassium rechecked.  I would like for you to have labs completed for me at [facility] at your convenience in the next week due to the question of early cirrhosis on prior imaging.  We will arrange to have an ultrasound of your abdomen in August.  Will get you scheduled for an upper endoscopy with possible stretching of your esophagus with Dr. Cindie to further evaluate your swallowing problems.  Dysphagia precautions:  Eat slowly, take small bites, chew thoroughly, drink plenty of liquids throughout meals.  Avoid trough textures All meats should be chopped finely.  If something gets hung in your esophagus and will not come up or go down, proceed to the emergency room.    Continue taking pantoprazole  40 mg twice a day 30 minutes before breakfast and dinner.  Continue to avoid ibuprofen .  It is important to avoid all NSAIDs which also includes Aleve , Advil , BC powders, Goody powders, and anything that says NSAID on the package.  If you continue to have diarrhea, please let me know and we will perform additional testing.  I will plan to see you back in the office after your endoscopy.  Josette Centers, PA-C [facility] Gastroenterology

## 2024-06-19 NOTE — Telephone Encounter (Signed)
 Copied from CRM [id number]. Topic: Clinical - Prescription Issue >> Jun 19, 2024  9:25 AM Essie A wrote: Reason for CRM: [facility] called regarding medicatio.  Dr.Harris was giving patient naproxyn 500 mg twice a day on a routine basis.  Dr. Lloyd sent a prescription for ibuprofen  800 mg on 06/16/24.  Patient can't take both.  Please call [phone] to speak with Hoy.

## 2024-06-19 NOTE — Telephone Encounter (Signed)
 Called pt, VM full. Needs to give US  appt 8/13,, arrival 915am, npo midnight and scheduled for EGD +/-ED with Dr. Cindie, ASA 2, no DM meds morning of.

## 2024-06-20 ENCOUNTER — Telehealth: Payer: Self-pay

## 2024-06-20 ENCOUNTER — Other Ambulatory Visit: Payer: Self-pay | Admitting: Family Medicine

## 2024-06-20 ENCOUNTER — Telehealth: Payer: Self-pay | Admitting: Orthopedic Surgery

## 2024-06-20 ENCOUNTER — Ambulatory Visit: Payer: Self-pay | Admitting: Family Medicine

## 2024-06-20 ENCOUNTER — Encounter: Payer: Self-pay | Admitting: Gastroenterology

## 2024-06-20 ENCOUNTER — Ambulatory Visit (HOSPITAL_COMMUNITY)
Admission: RE | Admit: 2024-06-20 | Discharge: 2024-06-20 | Disposition: A | Discharge status: Home / Self Care | Payer: MEDICAID | Source: Ambulatory Visit | Attending: Nurse Practitioner | Admitting: Nurse Practitioner

## 2024-06-20 DIAGNOSIS — E876 Hypokalemia: Secondary | ICD-10-CM

## 2024-06-20 DIAGNOSIS — R198 Other specified symptoms and signs involving the digestive system and abdomen: Secondary | ICD-10-CM | POA: Diagnosis present

## 2024-06-20 NOTE — Telephone Encounter (Signed)
 Copied from CRM [id number]. Topic: Clinical - Prescription Issue >> Jun 19, 2024  9:25 AM Essie A wrote: Reason for CRM: [facility].  Dr.Harris was giving patient naproxyn 500 mg twice a day on a routine basis.  Dr. Lloyd sent a prescription for ibuprofen  800 mg on 06/16/24.  Patient can't take both.  Please call [phone] to speak with Hoy.

## 2024-06-20 NOTE — Telephone Encounter (Signed)
 DR. MARGRETTE Pears  from [facility] called with a urgent question she wants to know naproxen  500 mg twice a day and the other one Dr. MARGRETTE has her on is Ibuprofen  800 mg every 8 hours as need to be taken at the same time?   They need to know if you want her to take both of these at the same time?  She said this is VERY URGENT and she thinks she should stop taking Ibuprofen  800 mg because it can cause stomach ulcers and bleeding maybe a trip to the ER if she takes them both at the same time   Please call Pears back asap at [phone]

## 2024-06-20 NOTE — Telephone Encounter (Signed)
 I do not see either of these meds prescribed for her, I will forward to Dr Margrette but it does not look like he has given either.

## 2024-06-20 NOTE — Telephone Encounter (Signed)
 Pt left VM, called back, no answer and not able to leave VM

## 2024-06-20 NOTE — Telephone Encounter (Signed)
Ibuprofen discontinued.

## 2024-06-20 NOTE — Telephone Encounter (Signed)
 See other encounter.

## 2024-06-20 NOTE — Telephone Encounter (Signed)
 [facility] has been advised and has Discontinued it off her record

## 2024-06-21 ENCOUNTER — Encounter (HOSPITAL_COMMUNITY): Payer: MEDICAID

## 2024-06-21 NOTE — Telephone Encounter (Signed)
 I spoke to her Heidi [name] And this is done.

## 2024-06-21 NOTE — Telephone Encounter (Signed)
 DR. MARGRETTE Pears from [facility] called back and is waiting for someone from the office to call her back about what to do with the patient medicine please call her back at [phone]

## 2024-06-21 NOTE — Telephone Encounter (Signed)
 I left message for Heidi [name] ? To call me back

## 2024-06-21 NOTE — Telephone Encounter (Signed)
 Spoke to pharmacy Naproxen  most recent  Ibuprofen  is cancelled   She wants me to call patient also

## 2024-06-22 ENCOUNTER — Ambulatory Visit: Payer: Self-pay | Admitting: Gastroenterology

## 2024-06-22 LAB — AFP TUMOR MARKER: AFP-Tumor Marker: 2.6 ng/mL

## 2024-06-22 LAB — HEPATITIS B CORE ANTIBODY, TOTAL: Hep B Core Total Ab: NONREACTIVE

## 2024-06-22 LAB — HEPATITIS B SURFACE ANTIGEN: Hepatitis B Surface Ag: NONREACTIVE

## 2024-06-22 LAB — PROTIME-INR
INR: 0.9
Prothrombin Time: 10.3 s (ref 9.0–11.5)

## 2024-06-22 LAB — HEPATITIS B SURFACE ANTIBODY,QUALITATIVE: Hep B S Ab: NONREACTIVE

## 2024-06-22 LAB — HEPATITIS A ANTIBODY, TOTAL: Hepatitis A AB,Total: NONREACTIVE

## 2024-06-26 ENCOUNTER — Encounter (HOSPITAL_COMMUNITY): Payer: MEDICAID | Admitting: Occupational Therapy

## 2024-06-28 ENCOUNTER — Ambulatory Visit: Payer: Self-pay | Admitting: Family Medicine

## 2024-06-28 NOTE — Telephone Encounter (Signed)
 Patient came by office agent still has not received the paperwork.

## 2024-06-29 ENCOUNTER — Ambulatory Visit (HOSPITAL_COMMUNITY): Payer: MEDICAID | Attending: Family Medicine

## 2024-06-29 DIAGNOSIS — G8929 Other chronic pain: Secondary | ICD-10-CM | POA: Diagnosis present

## 2024-06-29 DIAGNOSIS — R29898 Other symptoms and signs involving the musculoskeletal system: Secondary | ICD-10-CM | POA: Diagnosis present

## 2024-06-29 DIAGNOSIS — M25531 Pain in right wrist: Secondary | ICD-10-CM | POA: Diagnosis present

## 2024-06-29 DIAGNOSIS — M545 Low back pain, unspecified: Secondary | ICD-10-CM | POA: Insufficient documentation

## 2024-06-29 DIAGNOSIS — M6281 Muscle weakness (generalized): Secondary | ICD-10-CM | POA: Insufficient documentation

## 2024-06-29 DIAGNOSIS — M25631 Stiffness of right wrist, not elsewhere classified: Secondary | ICD-10-CM | POA: Insufficient documentation

## 2024-06-29 NOTE — Telephone Encounter (Signed)
 I have faxed to all the numbers she has given us . She may need to pick up a copy to give to her agent since she is stating they never received it

## 2024-06-29 NOTE — Telephone Encounter (Signed)
 Patient inform, will contact her agent.

## 2024-06-29 NOTE — Therapy (Signed)
 OUTPATIENT PHYSICAL THERAPY THORACOLUMBAR TREATMENT/PROGRESS NOTE Progress Note Reporting Period 03/31/24 to 06/29/2024  See note below for Objective Data and Assessment of Progress/Goals.       Patient Name: Heidi [name] MRN: [id number] DOB:Jan 01, 1971, 53 y.o., female Today's Date: 06/29/2024  END OF SESSION:  PT End of Session - 06/29/24 1057     Visit Number 9    Number of Visits 16    Date for PT Re-Evaluation 06/22/24    Authorization Type [employer]    Authorization Time Period 8 visits from 6/5 to 12/2    Authorization - Visit Number 1    Authorization - Number of Visits 8    Progress Note Due on Visit 8    PT Start Time 1100    PT Stop Time 1140    PT Time Calculation (min) 40 min    Activity Tolerance Patient tolerated treatment well    Behavior During Therapy [facility] for tasks assessed/performed             Past Medical History:  Diagnosis Date   Alcohol withdrawal (HCC)    Anxiety    Cervical radiculopathy    Depression    Diabetes mellitus without complication (HCC)    GERD (gastroesophageal reflux disease)    Hypertension    Past Surgical History:  Procedure Laterality Date   BIOPSY  11/30/2023   Procedure: BIOPSY;  Surgeon: Cindie Carlin POUR, DO;  [facility]: [facility];  Service: Endoscopy;;   BRAIN SURGERY     COLONOSCOPY WITH PROPOFOL  N/A 11/30/2023   Surgeon: Cindie Carlin K, DO;   5 mm polyp resected and retrieved, stool throughout the entire colon.  Pathology with tubular adenoma.  Recommended 5-year surveillance.   ESOPHAGOGASTRODUODENOSCOPY (EGD) WITH PROPOFOL  N/A 11/30/2023   Surgeon: Cindie Carlin POUR, DO;  Small hiatal hernia, gastritis biopsied, duodenitis.  Pathology with reactive gastropathy and focal erosion, negative for H. pylori. Recommended PPI BID.   FLEXIBLE SIGMOIDOSCOPY N/A 07/13/2023   Procedure: FLEXIBLE SIGMOIDOSCOPY;  Surgeon: Eartha Angelia Sieving, MD;  Location: [facility];  Service: Gastroenterology;   Laterality: N/A;   POLYPECTOMY  11/30/2023   Procedure: POLYPECTOMY;  Surgeon: Cindie Carlin POUR, DO;  Location: [facility];  Service: Endoscopy;;   Patient Active Problem List   Diagnosis Date Noted   Type 2 diabetes mellitus (HCC) 06/16/2024   Dysphagia 05/24/2024   Anxiety and depression 05/24/2024   Spinal stenosis of lumbar region with neurogenic claudication 05/11/2024   DOE (dyspnea on exertion) 04/17/2024   Former cigarette smoker 04/17/2024   Chest pain 02/08/2024   Lumbar facet arthropathy 12/28/2023   Chronic pain syndrome 12/28/2023   Cervical cancer screening 10/26/2023   Degenerative disc disease, lumbar 08/11/2023   Hospital discharge follow-up 07/29/2023   Laceration of spleen 07/24/2023   Ileus (HCC) 07/13/2023   Elevated LFTs 07/12/2023   Colonic obstruction (HCC) 07/12/2023   Hypothermia 07/09/2023   Hypokalemia 07/06/2023   Thrombocytopenia (HCC) 07/06/2023   Chronic back pain 05/18/2023   Bilateral knee pain 04/26/2023   Depression, recurrent (HCC) 04/26/2023   Alcohol use disorder 04/20/2023   Carpal tunnel syndrome of right wrist 04/20/2023   Tachycardia 04/20/2023   Transaminitis 04/06/2023   GERD (gastroesophageal reflux disease) 04/06/2023   Tobacco use disorder 04/06/2023   Alcohol withdrawal (HCC) 10/19/2022   Essential hypertension 10/19/2022   Nausea & vomiting 10/19/2022   Hypomagnesemia 10/19/2022   Cervical radicular pain 10/29/2010    PCP: Terry Wilhelmena Lloyd Hilario, FNP  REFERRING PROVIDER: Terry Wilhelmena Lloyd Hilario, FNP  REFERRING DIAG: M54.50 (ICD-10-CM) - Lumbar pain   Rationale for Evaluation and Treatment: Rehabilitation  THERAPY DIAG:  Muscle weakness (generalized)  Other symptoms and signs involving the musculoskeletal system  Low back pain, unspecified back pain laterality, unspecified chronicity, unspecified whether sciatica present  Chronic bilateral low back pain without sciatica  ONSET DATE: numerous  years  SUBJECTIVE:                                                                                                                                                                                           SUBJECTIVE STATEMENT: Back is still hurting quite a bit; now going into her hips and legs and tingling in her toes.  States she has scoliosis and the pain is worse with standing and walking.  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    Eval:  Pt reporting she was coming here earlier last year but was told by upfront staff to, not come back until after December. Pt reporting constant back pain that has been severing limiting and is always in pain.   PERTINENT HISTORY:  Hx of knee pain, cervical pain, and see some bilateral UE numbness  PAIN:  Are you having pain? Yes: NPRS scale: 7/10 Pain location: low back/R hip Pain description: achy/dull Aggravating factors: walking/standing Relieving factors: resting/laying down  PRECAUTIONS: None  RED FLAGS: None   WEIGHT BEARING RESTRICTIONS: No  FALLS:  Has patient fallen in last 6 months? No   PATIENT GOALS: get out of pain  NEXT MD VISIT:   OBJECTIVE:  Note: Objective measures were completed at Evaluation unless otherwise noted.  DIAGNOSTIC FINDINGS:  MRI:L3-4 there is a mild disc bulge flattening the ventral thecal sac and small right foraminal disc extrusion. Moderate-severe bilateral facet arthropathy with small facet effusions. Moderate central canal stenosis. Severe right foraminal stenosis. Moderate left foraminal stenosis.  PATIENT SURVEYS:  Modified Oswestry 38/50 = 76%  05/25/24: 28/50 COGNITION: Overall cognitive status: Within functional limits for tasks assessed     SENSATION: WFL  POSTURE: rounded shoulders, forward head, increased lumbar lordosis, and anterior pelvic tilt  PALPATION: Tender to touch bilateral lumbar paraspinals  LUMBAR ROM:   AROM eval 04/06/24  06/29/24  Flexion 90% and painful  Fingertips to 4 above ankles  Extension 100% and painful  70% available no pain  Right lateral flexion  100% c/o tightness on Lt QL region, hand past knee   Left lateral flexion  100% painfree with hand past knee   Right rotation  50% painful center of  and  c/o tightness   Left rotation  50% painful and c/o tightness    (Blank rows = not tested)  LOWER EXTREMITY ROM:     Active  Right eval Left eval  Hip flexion    Hip extension    Hip abduction    Hip adduction    Hip internal rotation    Hip external rotation    Knee flexion    Knee extension    Ankle dorsiflexion    Ankle plantarflexion    Ankle inversion    Ankle eversion     (Blank rows = not tested)  LOWER EXTREMITY MMT:    MMT Right eval Left eval Right 06/29/24 Left 06/29/24  Hip flexion 4- 4- 4+ 4+  Hip extension 3+ 4-    Hip abduction 3+ 4-    Hip adduction      Hip internal rotation      Hip external rotation      Knee flexion      Knee extension   5 5  Ankle dorsiflexion   4+ 4+  Ankle plantarflexion      Ankle inversion      Ankle eversion       (Blank rows = not tested)  FUNCTIONAL TESTS:  Evaluation:  30 Second Chair Stand Test: 10x 30 Second Chair Stand Test: 9x Norms:   Age 39-64 37-69 70-74 75-79 80-84 85-89 90-94  Women 15 15 14 13 12 11 9   Men 17 16 15 14 13 11 9   2  minute walk test: 238ft  05/17/24:  with RW 310 feet 05/25/2024: : 130 feet and then needed sitting break, today is a rough day.  GAIT: Distance walked: > 345ft with total ambulation throughout session Assistive device utilized: None Level of assistance: Complete Independence Comments: externally rotated BLE during swing and stance phase with antalgia via RLE  TREATMENT DATE:  06/29/24 Progress note 2 MWT held due to SOB 30 sec sit to stand 6 x (short of breath and feels dizzy) Modified Oswestry 30/50 60%(better than eval but slightly worse than last progress  note) Discussed with patient the importance of weighing daily when you have cardiac issues BP 162/90 left arm; O2 96-97% Lumbar mobility and MMT's see above Lumbar extension over table  Discussion of benefit of aquatic therapy  06/01/24: NuStep, seat 7, 5', breaks throughout, level 1 Ambulation with NBQC, 139 ft LTR, 2 set of 10 reps bilaterally, pt cued to remain in pain free ROM [employer], 15 holds 3x each LE Hip Abd + TA contract w/ MHP donned, RTB around knees, 2x10 SLR + TA contract w/ MHP donned, 2x10 ea LE  05/25/2024  Therapeutic Exercise: -Supine bridges 1 set of 10 reps, 3 second holds, symptomatic, pt cued for max hip extension -Heel Raises, 1 set of 20 reps, pt requires demonstration -Standing 3 way hip 1 sets 5 reps, bilaterally, pt cued for upright trunk and maintaining of neutral spine -LTR 2 set of 10 reps bilaterally, pt cued to remain in pain free ROM -Childs pose to prone press up stretch, 3 second holds, 8 reps (modified position require for prone position) -Cat/Cow stretch, 3 second holds, 6 reps, pt requires cueing for increased core activation   05/17/24 Nustep seat 9 UE/LE level 3 [place] 5 minutes Standing: heelraises 20X  Toeraises 20X  Hip abduction 2X10  Hip extension 2X10  Alt marching 2X10  Hamstrings stretch 2X30 each on 12 step  with RW 310 feet  PATIENT EDUCATION:  Education details: PT Evaluation, findings, prognosis, frequency, attendance policy, and HEP. Person educated: Patient Education method: Medical illustrator Education comprehension: verbalized understanding  HOME EXERCISE PROGRAM: Access Code: [email or web address] URL: [email or web address] Date: 05/09/2024 Prepared by: [facility]  Exercises - Supine Scapular Retraction  - 2 x daily - 7 x weekly - 1 sets - 10 reps - 5 sec hold - Heel Raises with Counter Support  - 2 x daily - 7 x weekly - 1 sets - 10 reps - Gastroc Stretch on Wall  - 2 x daily - 7 x  weekly - 1 sets - 3 reps - 20 sec hold Access Code: [email or web address] URL: [email or web address] Date: 03/31/2024 Prepared by: Omega Bottcher  Exercises - Supine Transversus Abdominis Bracing - Hands on Stomach  - 1 x daily - 7 x weekly - 3 sets - 10 reps - Supine Bridge  - 1 x daily - 7 x weekly - 3 sets - 10 reps - 5 hold - Supine Posterior Pelvic Tilt  - 1 x daily - 7 x weekly - 3 sets - 10 reps - 10 hold - Clamshell  - 1 x daily - 7 x weekly - 3 sets - 10 reps - 5 hold - Supine Piriformis Stretch with Foot on Ground  - 1 x daily - 7 x weekly - 3 sets - 3 reps - 30 hold  04/06/24:  Decompression  04/26/24- Hooklying Isometric Hip Abduction with Belt  - 2 x daily - 7 x weekly - 1 sets - 10 reps - 5 hold Walking program  05/17/24 Hip abduction, extension marching with RTB Postural strengthening RTB (sheet in filing cabinet) Access Code: [email or web address] URL: [email or web address] Date: 05/25/2024 Prepared by: Lang Ada  Exercises - Child's Pose Stretch  - 1 x daily - 7 x weekly - 3 sets - 10 reps - Cat Cow  - 1 x daily - 7 x weekly - 3 sets - 10 reps  ASSESSMENT:  CLINICAL IMPRESSION: Progress note today. Vitals assessed due to SOB.   Discussed with patient the importance of weighing daily when you have cardiac issues as her belly appears distended and she is having shortness of breath so question possible fluid retention? Also discussed aquatic therapy benefits as she has a history of chronic pain; per Dr. Ada she likely has scoliosis but is not interested in any type of surgery for that.  Also discussed with patient the importance of consistency with attending therapy and with HEP and that if she continues to plateau we will have to discharge physical therapy.    Patient would continue to benefit from skilled physical therapy for decreased low back pain, increased endurance with ambulation, increased LE/core strength, and improved balance for improved quality of life,  improved independence with gait training, improved functional mobility and continued progress towards therapy goals.     Eval:  Patient is a 53 y.o. female who was seen today for physical therapy evaluation and treatment for M54.50 (ICD-10-CM) - Lumbar pain. Pt reports severity of pain and symptoms are rated as severe but objective measures do not show same level of severity. Pt with chronic back pain in nature, pt also with poor attendance in previous PT POC. Pt is demonstrating functional limitations, reduced capacity for ambulation and functional mobility due to muscle weakness, ROM deficits, chronic pain and poor lumbar posture. Pt will benefit from skilled Physical Therapy services to address deficits/limitations in order to improve functional and QOL.    OBJECTIVE  IMPAIRMENTS: decreased activity tolerance, decreased balance, decreased mobility, decreased ROM, decreased strength, hypomobility, postural dysfunction, and pain.   ACTIVITY LIMITATIONS: carrying, lifting, bending, sitting, standing, squatting, sleeping, stairs, transfers, bed mobility, toileting, dressing, reach over head, hygiene/grooming, and locomotion level  PARTICIPATION LIMITATIONS: meal prep, cleaning, laundry, driving, shopping, community activity, occupation, and yard work  PERSONAL FACTORS: Age and Behavior pattern are also affecting patient's functional outcome.   REHAB POTENTIAL: Fair poor attendace  CLINICAL DECISION MAKING: Stable/uncomplicated  EVALUATION COMPLEXITY: Low   GOALS: Goals reviewed with patient? No  SHORT TERM GOALS: Target date: 06/15/24  Pt will be independent with HEP in order to demonstrate participation in Physical Therapy POC.  Baseline: Goal status: IN PROGRESS  2.  Pt will report 4/10 pain with mobility in order to demonstrate improved pain with ADLs.  Baseline:  Goal status: NOT MET  LONG TERM GOALS: Target date: 06/22/2024  Pt will improve 30 Second Chair Stand test by at least  5 in order to demonstrate improved functional strength to return to desired activities.  Baseline: see objective.  Goal status: NOT MET  2.  Pt will improve 2 MWT by at least 179ft in order to demonstrate improved functional ambulatory capacity in community setting.  Baseline: see objective.  Goal status: NOT MET  3.  Pt will improve Modified Oswestry score by 15 points in order to demonstrate improved pain with functional goals and outcomes. Baseline: see objective.  Goal status: NOT MET  4.  Pt will report 3/10 pain with mobility in order to demonstrate reduced pain with ADLs lasting greater than 30 minutes.  Baseline: see objective.  Goal status: NOT MET  PLAN:  PT FREQUENCY: 1x/week  PT DURATION: 8 weeks *scheduling one visit at a time*  PLANNED INTERVENTIONS: 97164- PT Re-evaluation, 97750- Physical Performance Testing, 97110-Therapeutic exercises, 97530- Therapeutic activity, 97112- Neuromuscular re-education, 97535- Self Care, 02859- Manual therapy, [phone]- Gait training, [phone]- Electrical stimulation (unattended), [phone]- Electrical stimulation (manual), Patient/Family education, Balance training, Stair training, Taping, Dry Needling, Joint mobilization, Joint manipulation, Spinal manipulation, Spinal mobilization, Cryotherapy, and Moist heat.  PLAN FOR NEXT SESSION: one visit at a time due to attendance.  Focus on  gluteal weakness, posture.   11:45 AM, 06/29/24 Neisha Hinger Small Anshi Jalloh MPT [place] [facility] [place] [phone]

## 2024-06-30 ENCOUNTER — Telehealth: Payer: Self-pay | Admitting: Family Medicine

## 2024-06-30 ENCOUNTER — Other Ambulatory Visit: Payer: Self-pay | Admitting: Family Medicine

## 2024-06-30 NOTE — Telephone Encounter (Signed)
I have not received any form to sign

## 2024-06-30 NOTE — Telephone Encounter (Signed)
 Please advise  Explain to patient will contact her Child psychotherapist. Copied from CRM [id number]. Topic: Medical Record Request - Other >> Jun 30, 2024 10:16 AM Silvana PARAS wrote: Reason for CRM: Pt need consent form signed for CAP program for [place] [facility]. Callback number is [phone].

## 2024-07-03 NOTE — Telephone Encounter (Signed)
 Do you have these forms?

## 2024-07-04 ENCOUNTER — Ambulatory Visit (HOSPITAL_COMMUNITY): Payer: MEDICAID | Admitting: Physical Therapy

## 2024-07-04 DIAGNOSIS — G8929 Other chronic pain: Secondary | ICD-10-CM

## 2024-07-04 DIAGNOSIS — M545 Low back pain, unspecified: Secondary | ICD-10-CM

## 2024-07-04 DIAGNOSIS — M6281 Muscle weakness (generalized): Secondary | ICD-10-CM | POA: Diagnosis not present

## 2024-07-04 DIAGNOSIS — R29898 Other symptoms and signs involving the musculoskeletal system: Secondary | ICD-10-CM

## 2024-07-04 NOTE — Therapy (Signed)
 OUTPATIENT PHYSICAL THERAPY THORACOLUMBAR TREATMENT    Patient Name: Heidi [name] MRN: [id number] DOB:11/29/1971, 53 y.o., female Today's Date: 07/04/2024  END OF SESSION:  PT End of Session - 07/04/24 1302     Visit Number 10    Number of Visits 16    Date for PT Re-Evaluation 06/22/24    Authorization Type [employer]    Authorization Time Period 8 visits from 6/5 to 12/2;    Progress Note Due on Visit 19    PT Start Time 1142    PT Stop Time 1230    PT Time Calculation (min) 48 min    Activity Tolerance Patient tolerated treatment well    Behavior During Therapy [facility] for tasks assessed/performed              Past Medical History:  Diagnosis Date   Alcohol withdrawal (HCC)    Anxiety    Cervical radiculopathy    Depression    Diabetes mellitus without complication (HCC)    GERD (gastroesophageal reflux disease)    Hypertension    Past Surgical History:  Procedure Laterality Date   BIOPSY  11/30/2023   Procedure: BIOPSY;  Surgeon: Cindie Carlin POUR, DO;  [facility];  Service: Endoscopy;;   BRAIN SURGERY     COLONOSCOPY WITH PROPOFOL  N/A 11/30/2023   Surgeon: Cindie Carlin K, DO;   5 mm polyp resected and retrieved, stool throughout the entire colon.  Pathology with tubular adenoma.  Recommended 5-year surveillance.   ESOPHAGOGASTRODUODENOSCOPY (EGD) WITH PROPOFOL  N/A 11/30/2023   Surgeon: Cindie Carlin POUR, DO;  Small hiatal hernia, gastritis biopsied, duodenitis.  Pathology with reactive gastropathy and focal erosion, negative for H. pylori. Recommended PPI BID.   FLEXIBLE SIGMOIDOSCOPY N/A 07/13/2023   Procedure: FLEXIBLE SIGMOIDOSCOPY;  Surgeon: Eartha Angelia Sieving, MD;  [facility];  Service: Gastroenterology;  Laterality: N/A;   POLYPECTOMY  11/30/2023   Procedure: POLYPECTOMY;  Surgeon: Cindie Carlin POUR, DO;  [facility];  Service: Endoscopy;;   Patient Active Problem List   Diagnosis Date Noted    Type 2 diabetes mellitus (HCC) 06/16/2024   Dysphagia 05/24/2024   Anxiety and depression 05/24/2024   Spinal stenosis of lumbar region with neurogenic claudication 05/11/2024   DOE (dyspnea on exertion) 04/17/2024   Former cigarette smoker 04/17/2024   Chest pain 02/08/2024   Lumbar facet arthropathy 12/28/2023   Chronic pain syndrome 12/28/2023   Cervical cancer screening 10/26/2023   Degenerative disc disease, lumbar 08/11/2023   Hospital discharge follow-up 07/29/2023   Laceration of spleen 07/24/2023   Ileus (HCC) 07/13/2023   Elevated LFTs 07/12/2023   Colonic obstruction (HCC) 07/12/2023   Hypothermia 07/09/2023   Hypokalemia 07/06/2023   Thrombocytopenia (HCC) 07/06/2023   Chronic back pain 05/18/2023   Bilateral knee pain 04/26/2023   Depression, recurrent (HCC) 04/26/2023   Alcohol use disorder 04/20/2023   Carpal tunnel syndrome of right wrist 04/20/2023   Tachycardia 04/20/2023   Transaminitis 04/06/2023   GERD (gastroesophageal reflux disease) 04/06/2023   Tobacco use disorder 04/06/2023   Alcohol withdrawal (HCC) 10/19/2022   Essential hypertension 10/19/2022   Nausea & vomiting 10/19/2022   Hypomagnesemia 10/19/2022   Cervical radicular pain 10/29/2010    PCP: Terry Wilhelmena Lloyd Hilario, FNP  REFERRING PROVIDER: Terry Wilhelmena Lloyd Hilario, FNP  REFERRING DIAG: M54.50 (ICD-10-CM) - Lumbar pain   Rationale for Evaluation and Treatment: Rehabilitation  THERAPY DIAG:  Muscle weakness (generalized)  Other symptoms and signs involving the musculoskeletal  system  Low back pain, unspecified back pain laterality, unspecified chronicity, unspecified whether sciatica present  Chronic bilateral low back pain without sciatica  ONSET DATE: numerous years  SUBJECTIVE:                                                                                                                                                                                           SUBJECTIVE  STATEMENT: 07/04/24:  pt states she doesn't think her spine will ever get better and she is content with using the rollator for ambulation.  Back is still hurting very litte as well her her hips/legs currently.     Eval:  Pt reporting she was coming here earlier last year but was told by upfront staff to, not come back until after December. Pt reporting constant back pain that has been severing limiting and is always in pain.   PERTINENT HISTORY:  Hx of knee pain, cervical pain, and see some bilateral UE numbness  PAIN:  Are you having pain? Yes: NPRS scale: 7/10 Pain location: low back/R hip Pain description: achy/dull Aggravating factors: walking/standing Relieving factors: resting/laying down  PRECAUTIONS: None  RED FLAGS: None   WEIGHT BEARING RESTRICTIONS: No  FALLS:  Has patient fallen in last 6 months? No   PATIENT GOALS: get out of pain  NEXT MD VISIT:   OBJECTIVE:  Note: Objective measures were completed at Evaluation unless otherwise noted.  DIAGNOSTIC FINDINGS:  MRI:L3-4 there is a mild disc bulge flattening the ventral thecal sac and small right foraminal disc extrusion. Moderate-severe bilateral facet arthropathy with small facet effusions. Moderate central canal stenosis. Severe right foraminal stenosis. Moderate left foraminal stenosis.  PATIENT SURVEYS:  Modified Oswestry 38/50 = 76%  05/25/24: 28/50 COGNITION: Overall cognitive status: Within functional limits for tasks assessed     SENSATION: WFL  POSTURE: rounded shoulders, forward head, increased lumbar lordosis, and anterior pelvic tilt  PALPATION: Tender to touch bilateral lumbar paraspinals  LUMBAR ROM:   AROM eval 04/06/24 06/29/24  Flexion 90% and painful  Fingertips to 4 above ankles  Extension 100% and painful  70% available no pain  Right lateral flexion  100% c/o tightness on Lt QL region, hand past knee   Left lateral flexion  100% painfree with hand past knee   Right rotation   50% painful center of  and c/o tightness   Left rotation  50% painful and c/o tightness    (Blank rows = not tested)  LOWER EXTREMITY ROM:     Active  Right eval Left eval  Hip flexion    Hip extension    Hip abduction  Hip adduction    Hip internal rotation    Hip external rotation    Knee flexion    Knee extension    Ankle dorsiflexion    Ankle plantarflexion    Ankle inversion    Ankle eversion     (Blank rows = not tested)  LOWER EXTREMITY MMT:    MMT Right eval Left eval Right 06/29/24 Left 06/29/24  Hip flexion 4- 4- 4+ 4+  Hip extension 3+ 4-    Hip abduction 3+ 4-    Hip adduction      Hip internal rotation      Hip external rotation      Knee flexion      Knee extension   5 5  Ankle dorsiflexion   4+ 4+  Ankle plantarflexion      Ankle inversion      Ankle eversion       (Blank rows = not tested)  FUNCTIONAL TESTS:  Evaluation:  30 Second Chair Stand Test: 10x 30 Second Chair Stand Test: 9x Norms:   Age 61-64 35-69 70-74 75-79 80-84 85-89 90-94  Women 15 15 14 13 12 11 9   Men 17 16 15 14 13 11 9   2  minute walk test: 252ft  05/17/24:  with RW 310 feet 05/25/2024: : 130 feet and then needed sitting break, today is a rough day.  GAIT: Distance walked: > 32ft with total ambulation throughout session Assistive device utilized: None Level of assistance: Complete Independence Comments: externally rotated BLE during swing and stance phase with antalgia via RLE  TREATMENT DATE:  07/04/24 UBE 2'fwd/2'bkwd Standing: hip excursions forward lunges onto 4 step no UE 10X each  Hip abduction 10X each  Hip extension 10X each Gait with [facility]  06/29/24 Progress note 2 MWT held due to SOB 30 sec sit to stand 6 x (short of breath and feels dizzy) Modified Oswestry 30/50 60%(better than eval but slightly worse than last progress note) Discussed with patient the importance of weighing daily when you have cardiac issues BP 162/90 left arm; O2  96-97% Lumbar mobility and MMT's see above Lumbar extension over table  Discussion of benefit of aquatic therapy  06/01/24: NuStep, seat 7, 5', breaks throughout, level 1 Ambulation with NBQC, 139 ft LTR, 2 set of 10 reps bilaterally, pt cued to remain in pain free ROM [employer], 15 holds 3x each LE Hip Abd + TA contract w/ MHP donned, RTB around knees, 2x10 SLR + TA contract w/ MHP donned, 2x10 ea LE  05/25/2024  Therapeutic Exercise: -Supine bridges 1 set of 10 reps, 3 second holds, symptomatic, pt cued for max hip extension -Heel Raises, 1 set of 20 reps, pt requires demonstration -Standing 3 way hip 1 sets 5 reps, bilaterally, pt cued for upright trunk and maintaining of neutral spine -LTR 2 set of 10 reps bilaterally, pt cued to remain in pain free ROM -Childs pose to prone press up stretch, 3 second holds, 8 reps (modified position require for prone position) -Cat/Cow stretch, 3 second holds, 6 reps, pt requires cueing for increased core activation   05/17/24 Nustep seat 9 UE/LE level 3 [place] 5 minutes Standing: heelraises 20X  Toeraises 20X  Hip abduction 2X10  Hip extension 2X10  Alt marching 2X10  Hamstrings stretch 2X30 each on 12 step  with RW 310 feet   PATIENT EDUCATION:  Education details: PT Evaluation, findings, prognosis, frequency, attendance policy, and HEP. Person educated: Patient Education method: Explanation  and Demonstration Education comprehension: verbalized understanding  HOME EXERCISE PROGRAM: Access Code: [email or web address] URL: [email or web address] Date: 05/09/2024 Prepared by: [facility]  Exercises - Supine Scapular Retraction  - 2 x daily - 7 x weekly - 1 sets - 10 reps - 5 sec hold - Heel Raises with Counter Support  - 2 x daily - 7 x weekly - 1 sets - 10 reps - Gastroc Stretch on Wall  - 2 x daily - 7 x weekly - 1 sets - 3 reps - 20 sec hold Access Code: [email or web address] URL: [email or web address] Date:  03/31/2024 Prepared by: Omega Bottcher  Exercises - Supine Transversus Abdominis Bracing - Hands on Stomach  - 1 x daily - 7 x weekly - 3 sets - 10 reps - Supine Bridge  - 1 x daily - 7 x weekly - 3 sets - 10 reps - 5 hold - Supine Posterior Pelvic Tilt  - 1 x daily - 7 x weekly - 3 sets - 10 reps - 10 hold - Clamshell  - 1 x daily - 7 x weekly - 3 sets - 10 reps - 5 hold - Supine Piriformis Stretch with Foot on Ground  - 1 x daily - 7 x weekly - 3 sets - 3 reps - 30 hold  04/06/24:  Decompression  04/26/24- Hooklying Isometric Hip Abduction with Belt  - 2 x daily - 7 x weekly - 1 sets - 10 reps - 5 hold Walking program  05/17/24 Hip abduction, extension marching with RTB Postural strengthening RTB (sheet in filing cabinet) Access Code: [email or web address] URL: [email or web address] Date: 05/25/2024 Prepared by: Lang Ada  Exercises - Child's Pose Stretch  - 1 x daily - 7 x weekly - 3 sets - 10 reps - Cat Cow  - 1 x daily - 7 x weekly - 3 sets - 10 reps  ASSESSMENT:  CLINICAL IMPRESSION: Began with UBE today for UE warmup.  Focus remains on LE strength/stab and lumbar mobility. Hip excursions started with cues to complete slowly with more control when descending to chair.  Pt tends to flop with sitting but does not c/o pain with this.  Forward lunges added with cues to complete in upright posturing and at slower pace with all exericses today as tends to rush through them.  Pt with impulsivity attempting activities without proper instruction.  Pt unable to ambulate in correct sequence with SPC despite constant instruction keeping foot advance with same side as UE holding SPC.  Pt did not have any LOB while using SPC, however and was overall safe. Pt reports wishes to remain with rollator however wants to work on cane as well.    Eval:  Patient is a 53 y.o. female who was seen today for physical therapy evaluation and treatment for M54.50 (ICD-10-CM) - Lumbar pain. Pt reports  severity of pain and symptoms are rated as severe but objective measures do not show same level of severity. Pt with chronic back pain in nature, pt also with poor attendance in previous PT POC. Pt is demonstrating functional limitations, reduced capacity for ambulation and functional mobility due to muscle weakness, ROM deficits, chronic pain and poor lumbar posture. Pt will benefit from skilled Physical Therapy services to address deficits/limitations in order to improve functional and QOL.    OBJECTIVE IMPAIRMENTS: decreased activity tolerance, decreased balance, decreased mobility, decreased ROM, decreased strength, hypomobility, postural dysfunction, and pain.   ACTIVITY LIMITATIONS: carrying, lifting, bending, sitting, standing, squatting, sleeping, stairs, transfers, bed mobility, toileting, dressing, reach  over head, hygiene/grooming, and locomotion level  PARTICIPATION LIMITATIONS: meal prep, cleaning, laundry, driving, shopping, community activity, occupation, and yard work  PERSONAL FACTORS: Age and Behavior pattern are also affecting patient's functional outcome.   REHAB POTENTIAL: Fair poor attendace  CLINICAL DECISION MAKING: Stable/uncomplicated  EVALUATION COMPLEXITY: Low   GOALS: Goals reviewed with patient? No  SHORT TERM GOALS: Target date: 06/15/24  Pt will be independent with HEP in order to demonstrate participation in Physical Therapy POC.  Baseline: Goal status: IN PROGRESS  2.  Pt will report 4/10 pain with mobility in order to demonstrate improved pain with ADLs.  Baseline:  Goal status: NOT MET  LONG TERM GOALS: Target date: 06/22/2024  Pt will improve 30 Second Chair Stand test by at least 5 in order to demonstrate improved functional strength to return to desired activities.  Baseline: see objective.  Goal status: NOT MET  2.  Pt will improve 2 MWT by at least 155ft in order to demonstrate improved functional ambulatory capacity in community setting.   Baseline: see objective.  Goal status: NOT MET  3.  Pt will improve Modified Oswestry score by 15 points in order to demonstrate improved pain with functional goals and outcomes. Baseline: see objective.  Goal status: NOT MET  4.  Pt will report 3/10 pain with mobility in order to demonstrate reduced pain with ADLs lasting greater than 30 minutes.  Baseline: see objective.  Goal status: NOT MET  PLAN:  PT FREQUENCY: 1x/week  PT DURATION: 8 weeks *scheduling one visit at a time*  PLANNED INTERVENTIONS: 97164- PT Re-evaluation, 97750- Physical Performance Testing, 97110-Therapeutic exercises, 97530- Therapeutic activity, 97112- Neuromuscular re-education, 97535- Self Care, 02859- Manual therapy, [phone]- Gait training, [phone]- Electrical stimulation (unattended), [phone]- Electrical stimulation (manual), Patient/Family education, Balance training, Stair training, Taping, Dry Needling, Joint mobilization, Joint manipulation, Spinal manipulation, Spinal mobilization, Cryotherapy, and Moist heat.  PLAN FOR NEXT SESSION: one visit at a time due to attendance.  Focus on  gluteal weakness, posture.   1:03 PM, 07/04/24 Greig KATHEE Fuse, PTA/CLT [facility] Ph: [phone]

## 2024-07-04 NOTE — Telephone Encounter (Signed)
 SPOKE WITH PT. She scheduled for EGD +/-DIL 8/12. Aware will send instructions her in the mail

## 2024-07-04 NOTE — Addendum Note (Signed)
 Addended by: JEANELL GRAEME RAMAN on: 07/04/2024 08:48 AM   Modules accepted: Orders

## 2024-07-05 ENCOUNTER — Other Ambulatory Visit: Payer: Self-pay | Admitting: Family Medicine

## 2024-07-06 ENCOUNTER — Encounter (HOSPITAL_COMMUNITY): Payer: Self-pay | Admitting: Occupational Therapy

## 2024-07-06 ENCOUNTER — Ambulatory Visit (HOSPITAL_COMMUNITY): Payer: MEDICAID | Admitting: Occupational Therapy

## 2024-07-06 ENCOUNTER — Other Ambulatory Visit: Payer: Self-pay | Admitting: Family Medicine

## 2024-07-06 DIAGNOSIS — M6281 Muscle weakness (generalized): Secondary | ICD-10-CM | POA: Diagnosis not present

## 2024-07-06 DIAGNOSIS — R29898 Other symptoms and signs involving the musculoskeletal system: Secondary | ICD-10-CM

## 2024-07-06 DIAGNOSIS — M25531 Pain in right wrist: Secondary | ICD-10-CM

## 2024-07-06 DIAGNOSIS — M48061 Spinal stenosis, lumbar region without neurogenic claudication: Secondary | ICD-10-CM

## 2024-07-06 DIAGNOSIS — M25631 Stiffness of right wrist, not elsewhere classified: Secondary | ICD-10-CM

## 2024-07-06 NOTE — Therapy (Signed)
 OUTPATIENT OCCUPATIONAL THERAPY ORTHO TREATMENT NOTE  Patient Name: Heidi [name] MRN: [id number] DOB:[birth year], 53 y.o., female Today's Date: 07/06/2024  PCP: Terry Wilhelmena Lloyd Hilario, FNP REFERRING PROVIDER: Terry Wilhelmena Lloyd Hilario, FNP  END OF SESSION:  OT End of Session - 07/06/24 1156     Visit Number 2    Number of Visits 4    Date for OT Re-Evaluation 07/14/24    Authorization Type [employer]    Authorization Time Period 8 visits (06/15/24-12/12/24)    Authorization - Visit Number 2    Authorization - Number of Visits 8    OT Start Time 1117    OT Stop Time 1156    OT Time Calculation (min) 39 min    Activity Tolerance Patient tolerated treatment well    Behavior During Therapy WFL for tasks assessed/performed           Past Medical History:  Diagnosis Date   Alcohol withdrawal (HCC)    Anxiety    Cervical radiculopathy    Depression    Diabetes mellitus without complication (HCC)    GERD (gastroesophageal reflux disease)    Hypertension    Past Surgical History:  Procedure Laterality Date   BIOPSY  11/30/2023   Procedure: BIOPSY;  Surgeon: Cindie Carlin POUR, DO;  [facility];  Service: Endoscopy;;   BRAIN SURGERY     COLONOSCOPY WITH PROPOFOL  N/A 11/30/2023   Surgeon: Cindie Carlin K, DO;   5 mm polyp resected and retrieved, stool throughout the entire colon.  Pathology with tubular adenoma.  Recommended 5-year surveillance.   ESOPHAGOGASTRODUODENOSCOPY (EGD) WITH PROPOFOL  N/A 11/30/2023   Surgeon: Cindie Carlin POUR, DO;  Small hiatal hernia, gastritis biopsied, duodenitis.  Pathology with reactive gastropathy and focal erosion, negative for H. pylori. Recommended PPI BID.   FLEXIBLE SIGMOIDOSCOPY N/A 07/13/2023   Procedure: FLEXIBLE SIGMOIDOSCOPY;  Surgeon: Eartha Angelia Sieving, MD;  [facility];  Service: Gastroenterology;  Laterality: N/A;   POLYPECTOMY  11/30/2023   Procedure: POLYPECTOMY;  Surgeon: Cindie Carlin POUR,  DO;  [facility];  Service: Endoscopy;;   Patient Active Problem List   Diagnosis Date Noted   Type 2 diabetes mellitus (HCC) 06/16/2024   Dysphagia 05/24/2024   Anxiety and depression 05/24/2024   Spinal stenosis of lumbar region with neurogenic claudication 05/11/2024   DOE (dyspnea on exertion) 04/17/2024   Former cigarette smoker 04/17/2024   Chest pain 02/08/2024   Lumbar facet arthropathy 12/28/2023   Chronic pain syndrome 12/28/2023   Cervical cancer screening 10/26/2023   Degenerative disc disease, lumbar 08/11/2023   Hospital discharge follow-up 07/29/2023   Laceration of spleen 07/24/2023   Ileus (HCC) 07/13/2023   Elevated LFTs 07/12/2023   Colonic obstruction (HCC) 07/12/2023   Hypothermia 07/09/2023   Hypokalemia 07/06/2023   Thrombocytopenia (HCC) 07/06/2023   Chronic back pain 05/18/2023   Bilateral knee pain 04/26/2023   Depression, recurrent (HCC) 04/26/2023   Alcohol use disorder 04/20/2023   Carpal tunnel syndrome of right wrist 04/20/2023   Tachycardia 04/20/2023   Transaminitis 04/06/2023   GERD (gastroesophageal reflux disease) 04/06/2023   Tobacco use disorder 04/06/2023   Alcohol withdrawal (HCC) 10/19/2022   Essential hypertension 10/19/2022   Nausea & vomiting 10/19/2022   Hypomagnesemia 10/19/2022   Cervical radicular pain 10/29/2010    ONSET DATE: 04/20/23  REFERRING DIAG: R Carpal Tunnel  THERAPY DIAG:  Pain in right wrist  Stiffness of right wrist, not elsewhere classified  Other symptoms and signs involving  the musculoskeletal system  Rationale for Evaluation and Treatment: Rehabilitation  SUBJECTIVE:   SUBJECTIVE STATEMENT: It's swole and aching today Pt accompanied by: self  PERTINENT HISTORY: PMH significant for DM2, Lumbar pain, HTN  PRECAUTIONS: None  WEIGHT BEARING RESTRICTIONS: No  PAIN:  Are you having pain? Yes: NPRS scale: 5/10 Pain location: hand and wrist Pain description: throbbing and  sharp Aggravating factors: everything Relieving factors: pain relief cream  FALLS: Has patient fallen in last 6 months? No  PATIENT GOALS: To decrease pain  NEXT MD VISIT: Unsure  OBJECTIVE:  Note: Objective measures were completed at Evaluation unless otherwise noted.  HAND DOMINANCE: Right  ADLs: Overall ADLs: Pt has difficulty holding on to objects, especially with cooking/meal prep and cleaning  FUNCTIONAL OUTCOME MEASURES: Upper Extremity Functional Scale (UEFS): 53/80 -63.3%  UPPER EXTREMITY ROM:     Active ROM Right eval  Wrist flexion 22  Wrist extension 52  Wrist ulnar deviation 34  Wrist radial deviation 8  Wrist pronation WFL  Wrist supination WFL  (Blank rows = not tested)    Pinky unable to flex, all other fingers WFL   Full fist 80%  UPPER EXTREMITY MMT:     MMT Right eval  Wrist flexion 4/5  Wrist extension 4/5  Wrist ulnar deviation 4-/5  Wrist radial deviation 4-/5  Wrist pronation 4+/5  Wrist supination 4+/5  (Blank rows = not tested)  HAND FUNCTION: Grip strength: Right: 22 lbs; Left: 68 lbs, Lateral pinch: Right: 9 lbs, Left: 9 lbs, and 3 point pinch: Right: 12 lbs, Left: 10 lbs  COORDINATION: 9 Hole Peg test: Right: 28.03 sec; Left: 30.56 sec  SENSATION: WFL  EDEMA: Pt reports swelling on and off in the MCP knuckles  OBSERVATIONS: Stiffness along wrist and MCP's   TREATMENT DATE:   07/06/24 -Digit ROM: composite flexion, abduction, extension, opposition, x10 -Wrist ROM: flexion, extension, ulnar/radial deviation, supination/pronation, x10  -Stretches: wrist flexion, wrist extension, prayer stretch, thumb stretch, 4x10 -Theraputty: red putty, roll into ball, flatten into pancake, roll into log, tripod pinch x10, lateral pinch x10, roll into ball, squeeze x10 -Median Nerve Glide x5 -Cone spins x3 -Wrist Strengthening: 2#, flexion, extension, ulnar/radial deviation, supination/pronation, x10  06/15/24 -Digit ROM: composite  flexion, abduction, extension, opposition, x10 -Wrist ROM: flexion, extension, ulnar/radial deviation, supination/pronation, x10                                                                                                                                 PATIENT EDUCATION: Education details: IT sales professional Person educated: Patient Education method: Explanation, Demonstration, and Handouts Education comprehension: verbalized understanding  HOME EXERCISE PROGRAM: 6/26: Digit and Wrist ROM 7/17: Wrist strengthening  GOALS: Goals reviewed with patient? Yes  SHORT TERM GOALS: Target date: 07/14/25  Pt will be provided and educated on HEP for RUE in order to complete independent ADL's.   Goal status: IN PROGRESS  2.  Pt will decrease pain  in RUE to 3/10 or less in order to complete writing and typing with no pain.  Goal status: IN PROGRESS  3.  Pt will increase strength in RUE to 4+/5 in order to grasp and carry items needed during dressing and bathing.  Goal status: IN PROGRESS  4.  Pt will increase grip strength in RUE by 15# and pinch strength by 2# in order to grasp and manipulate items during cooking and cleaning.  Goal status: IN PROGRESS   ASSESSMENT:  CLINICAL IMPRESSION: This session pt presents with increased swelling along the MCPS and wrist. She also reports increased pain. Despite swelling and pain, her ROM continues to be good and she is tolerating weights as well. Pt having fatigue with theraputty, with increased time and rest breaks she was able to complete the tasks. OT providing verbal and tactile cuing for positioning and technique.   PERFORMANCE DEFICITS: in functional skills including ADLs, IADLs, coordination, edema, ROM, strength, pain, fascial restrictions, Fine motor control, Gross motor control, body mechanics, and UE functional use.    PLAN:  OT FREQUENCY: 1x/week  OT DURATION: 4 weeks  PLANNED INTERVENTIONS: 97168 OT Re-evaluation,  97535 self care/ADL training, 02889 therapeutic exercise, 97530 therapeutic activity, 97140 manual therapy, 97035 ultrasound, 97018 paraffin, 02989 moist heat, 97032 electrical stimulation (manual), passive range of motion, functional mobility training, energy conservation, coping strategies training, patient/family education, and DME and/or AE instructions  RECOMMENDED OTHER SERVICES: N/A  CONSULTED AND AGREED WITH PLAN OF CARE: Patient  PLAN FOR NEXT SESSION: Trial Paraffin, manual, Wrist ROM, Digit ROM, grip strengthening   Valentin Thelbert NEILA Zelda [employer] [facility] [phone] Mandisa Persinger Jillyn Thelbert, OT 07/06/2024, 3:40 PM   Managed Medicaid Authorization Request  Visit Dx Codes: M25.531, M25.631, R29.898  Functional Tool Score: 53/80 - 63.3%  For all possible CPT codes, reference the Planned Interventions line above.     Check all conditions that are expected to impact treatment: {Conditions expected to impact treatment:None of these apply   If treatment provided at initial evaluation, no treatment charged due to lack of authorization.

## 2024-07-06 NOTE — Patient Instructions (Addendum)
 Strengthening Exercises  1) WRIST EXTENSION CURLS - TABLE  Hold a small free weight, rest your forearm on a table and bend your wrist up and down with your palm face down as shown.      2) WRIST FLEXION CURLS - TABLE  Hold a small free weight, rest your forearm on a table and bend your wrist up and down with your palm face up as shown.     3) FREE WEIGHT RADIAL/ULNAR DEVIATION - TABLE  Hold a small free weight, rest your forearm on a table and bend your wrist up and down with your palm facing towards the side as shown.     4) Pronation  Forearm supported on table with wrist in neutral position. Using a weight, roll wrist so that palm faces downward. Hold for 2 seconds and return to starting position.     5) Supination  Forearm supported on table with wrist in neutral position. Using a weight, roll wrist so that palm is now facing upward. Hold for 2 seconds and return to starting position.      *Complete exercises using 2 pound weight, 10-15 times each, 2-3 times per day*

## 2024-07-06 NOTE — Telephone Encounter (Signed)
 Too soon for refill.

## 2024-07-07 NOTE — Telephone Encounter (Signed)
 Pt came by to inquire about form. Nothing needed for her to sign, CAP form faxed back to number on form with confirmation

## 2024-07-12 ENCOUNTER — Telehealth (HOSPITAL_COMMUNITY): Payer: Self-pay

## 2024-07-12 ENCOUNTER — Encounter (HOSPITAL_COMMUNITY): Payer: MEDICAID

## 2024-07-12 NOTE — Telephone Encounter (Signed)
 No Show #2 Called patient concerning missed appointment this date. No answer. Unable to leave voicemail as mailbox is full. Sent SMS notification with front desk number.  As this is patient's second No Show, all remaining appointments will be cancelled besides next appointment and patient will schedule visits one at a time.   11:42 AM, 07/12/24 Rosaria Settler, PT, DPT [facility] [place]

## 2024-07-13 ENCOUNTER — Ambulatory Visit (HOSPITAL_COMMUNITY): Payer: MEDICAID | Admitting: Occupational Therapy

## 2024-07-13 ENCOUNTER — Encounter (HOSPITAL_COMMUNITY): Payer: Self-pay | Admitting: Occupational Therapy

## 2024-07-13 DIAGNOSIS — M25631 Stiffness of right wrist, not elsewhere classified: Secondary | ICD-10-CM

## 2024-07-13 DIAGNOSIS — M25531 Pain in right wrist: Secondary | ICD-10-CM

## 2024-07-13 DIAGNOSIS — M6281 Muscle weakness (generalized): Secondary | ICD-10-CM | POA: Diagnosis not present

## 2024-07-13 DIAGNOSIS — R29898 Other symptoms and signs involving the musculoskeletal system: Secondary | ICD-10-CM

## 2024-07-13 NOTE — Therapy (Signed)
 OUTPATIENT OCCUPATIONAL THERAPY ORTHO TREATMENT NOTE DISCHARGE NOTE  Patient Name: Heidi [name] MRN: [id number] DOB:1971-08-19, 53 y.o., female Today's Date: 07/13/2024  PCP: Terry Wilhelmena Lloyd Hilario, FNP REFERRING PROVIDER: Terry Wilhelmena Lloyd Hilario, FNP   OCCUPATIONAL THERAPY DISCHARGE SUMMARY  Visits from Start of Care: 3  Current functional level related to goals / functional outcomes: Pt has met all OT goals. Her strength and functional use are WFL.   Remaining deficits: Pt has no remaining deficits regarding R wrist/carpal tunnel   Education / Equipment: Pt has been provided comprehensive HEP   Plan: Patient agrees to discharge as all OT goals have been met.       END OF SESSION:  OT End of Session - 07/13/24 1205     Visit Number 3    Number of Visits 4    Date for OT Re-Evaluation 07/14/24    Authorization Type [employer]    Authorization Time Period 8 visits (06/15/24-12/12/24)    Authorization - Visit Number 3    Authorization - Number of Visits 8    OT Start Time 1125    OT Stop Time 1204    OT Time Calculation (min) 39 min    Activity Tolerance Patient tolerated treatment well    Behavior During Therapy WFL for tasks assessed/performed          Past Medical History:  Diagnosis Date   Alcohol withdrawal (HCC)    Anxiety    Cervical radiculopathy    Depression    Diabetes mellitus without complication (HCC)    GERD (gastroesophageal reflux disease)    Hypertension    Past Surgical History:  Procedure Laterality Date   BIOPSY  11/30/2023   Procedure: BIOPSY;  Surgeon: Cindie Carlin POUR, DO;  [facility]: [facility];  Service: Endoscopy;;   BRAIN SURGERY     COLONOSCOPY WITH PROPOFOL  N/A 11/30/2023   Surgeon: Cindie Carlin K, DO;   5 mm polyp resected and retrieved, stool throughout the entire colon.  Pathology with tubular adenoma.  Recommended 5-year surveillance.   ESOPHAGOGASTRODUODENOSCOPY (EGD) WITH PROPOFOL  N/A 11/30/2023   Surgeon:  Cindie Carlin POUR, DO;  Small hiatal hernia, gastritis biopsied, duodenitis.  Pathology with reactive gastropathy and focal erosion, negative for H. pylori. Recommended PPI BID.   FLEXIBLE SIGMOIDOSCOPY N/A 07/13/2023   Procedure: FLEXIBLE SIGMOIDOSCOPY;  Surgeon: Eartha Angelia Sieving, MD;  Location[facility];  Service: Gastroenterology;  Laterality: N/A;   POLYPECTOMY  11/30/2023   Procedure: POLYPECTOMY;  Surgeon: Cindie Carlin POUR, DO;  [facility];  Service: Endoscopy;;   Patient Active Problem List   Diagnosis Date Noted   Type 2 diabetes mellitus (HCC) 06/16/2024   Dysphagia 05/24/2024   Anxiety and depression 05/24/2024   Spinal stenosis of lumbar region with neurogenic claudication 05/11/2024   DOE (dyspnea on exertion) 04/17/2024   Former cigarette smoker 04/17/2024   Chest pain 02/08/2024   Lumbar facet arthropathy 12/28/2023   Chronic pain syndrome 12/28/2023   Cervical cancer screening 10/26/2023   Degenerative disc disease, lumbar 08/11/2023   Hospital discharge follow-up 07/29/2023   Laceration of spleen 07/24/2023   Ileus (HCC) 07/13/2023   Elevated LFTs 07/12/2023   Colonic obstruction (HCC) 07/12/2023   Hypothermia 07/09/2023   Hypokalemia 07/06/2023   Thrombocytopenia (HCC) 07/06/2023   Chronic back pain 05/18/2023   Bilateral knee pain 04/26/2023   Depression, recurrent (HCC) 04/26/2023   Alcohol use disorder 04/20/2023   Carpal tunnel syndrome of right wrist 04/20/2023  Tachycardia 04/20/2023   Transaminitis 04/06/2023   GERD (gastroesophageal reflux disease) 04/06/2023   Tobacco use disorder 04/06/2023   Alcohol withdrawal (HCC) 10/19/2022   Essential hypertension 10/19/2022   Nausea & vomiting 10/19/2022   Hypomagnesemia 10/19/2022   Cervical radicular pain 10/29/2010    ONSET DATE: 04/20/23  REFERRING DIAG: R Carpal Tunnel  THERAPY DIAG:  Pain in right wrist  Stiffness of right wrist, not elsewhere classified  Other  symptoms and signs involving the musculoskeletal system  Rationale for Evaluation and Treatment: Rehabilitation  SUBJECTIVE:   SUBJECTIVE STATEMENT: It's not hurting much now. More my whole body hurts at night. Pt accompanied by: self  PERTINENT HISTORY: PMH significant for DM2, Lumbar pain, HTN  PRECAUTIONS: None  WEIGHT BEARING RESTRICTIONS: No  PAIN:  Are you having pain? No  FALLS: Has patient fallen in last 6 months? No  PATIENT GOALS: To decrease pain  NEXT MD VISIT: Unsure  OBJECTIVE:  Note: Objective measures were completed at Evaluation unless otherwise noted.  HAND DOMINANCE: Right  ADLs: Overall ADLs: Pt has difficulty holding on to objects, especially with cooking/meal prep and cleaning  FUNCTIONAL OUTCOME MEASURES: Upper Extremity Functional Scale (UEFS): 53/80 -63.3%  UPPER EXTREMITY ROM:     Active ROM Right eval Right 07/13/24  Wrist flexion 22 43  Wrist extension 52 54  Wrist ulnar deviation 34 47  Wrist radial deviation 8 23  Wrist pronation [facility] WFL  Wrist supination WFL WFL  (Blank rows = not tested)    Pinky unable to flex, all other fingers WFL   Full fist 80%    07/13/24: Pinky unable to flex - full fist 95% (excluding pinky)  UPPER EXTREMITY MMT:     MMT Right eval Right 07/13/24  Wrist flexion 4/5 5/5  Wrist extension 4/5 5/5  Wrist ulnar deviation 4-/5 4+/5  Wrist radial deviation 4-/5 5/5  Wrist pronation 4+/5 5/5  Wrist supination 4+/5 5/5  (Blank rows = not tested)  HAND FUNCTION: Grip strength: Right: 22 lbs; Left: 68 lbs, Lateral pinch: Right: 9 lbs, Left: 9 lbs, and 3 point pinch: Right: 12 lbs, Left: 10 lbs 07/13/24: Grip strength: Right: 43 lbs; Lateral pinch: Right: 14 lbs, and 3 point pinch: Right: 9lbs  COORDINATION: 9 Hole Peg test: Right: 28.03 sec; Left: 30.56 sec  SENSATION: WFL  EDEMA: Pt reports swelling on and off in the MCP knuckles  OBSERVATIONS: Stiffness along wrist and MCP's   TREATMENT  DATE:   07/13/24 -Digit ROM: composite flexion, abduction, extension, opposition, x10 -Wrist ROM: flexion, extension, ulnar/radial deviation, supination/pronation, x10  -Finger Tendon Glides x10 -Median Nerve Glides x10 -Therabar: yellow bar, twists, supination bends, pronation bends, radial bends, ulnar bends, x10 -Measurements for reassessment  07/06/24 -Digit ROM: composite flexion, abduction, extension, opposition, x10 -Wrist ROM: flexion, extension, ulnar/radial deviation, supination/pronation, x10  -Stretches: wrist flexion, wrist extension, prayer stretch, thumb stretch, 4x10 -Theraputty: red putty, roll into ball, flatten into pancake, roll into log, tripod pinch x10, lateral pinch x10, roll into ball, squeeze x10 -Median Nerve Glide x5 -Cone spins x3 -Wrist Strengthening: 2#, flexion, extension, ulnar/radial deviation, supination/pronation, x10  06/15/24 -Digit ROM: composite flexion, abduction, extension, opposition, x10 -Wrist ROM: flexion, extension, ulnar/radial deviation, supination/pronation, x10  PATIENT EDUCATION: Education details: Brace wear schedule Person educated: Patient Education method: Explanation, Demonstration, and Handouts Education comprehension: verbalized understanding  HOME EXERCISE PROGRAM: 6/26: Digit and Wrist ROM 7/17: Wrist strengthening 7/24: Tendon/Nerve glides  GOALS: Goals reviewed with patient? Yes  SHORT TERM GOALS: Target date: 07/14/25  Pt will be provided and educated on HEP for RUE in order to complete independent ADL's.   Goal status: MET  2.  Pt will decrease pain in RUE to 3/10 or less in order to complete writing and typing with no pain.  Goal status: MET  3.  Pt will increase strength in RUE to 4+/5 in order to grasp and carry items needed during dressing and bathing.  Goal status: MET  4.   Pt will increase grip strength in RUE by 15# and pinch strength by 2# in order to grasp and manipulate items during cooking and cleaning.  Goal status: MET (except for tripod pinch)   ASSESSMENT:  CLINICAL IMPRESSION: Pt completed reassessment this session. She demonstrated improved ROM, wrist strength, and grip strength. She completed all OT goals and reviewed HEP. Pt has no further skilled OT needs and will be discharged from outpatient OT.   PERFORMANCE DEFICITS: in functional skills including ADLs, IADLs, coordination, edema, ROM, strength, pain, fascial restrictions, Fine motor control, Gross motor control, body mechanics, and UE functional use.    PLAN:  OT FREQUENCY: 1x/week  OT DURATION: 4 weeks  PLANNED INTERVENTIONS: 97168 OT Re-evaluation, 97535 self care/ADL training, 02889 therapeutic exercise, 97530 therapeutic activity, 97140 manual therapy, 97035 ultrasound, 97018 paraffin, 02989 moist heat, 97032 electrical stimulation (manual), passive range of motion, functional mobility training, energy conservation, coping strategies training, patient/family education, and DME and/or AE instructions  RECOMMENDED OTHER SERVICES: N/A  CONSULTED AND AGREED WITH PLAN OF CARE: Patient  PLAN FOR NEXT SESSION: Discharge   Tamarra Geiselman Thelbert, OTR/L [facility] [phone] Sisto Granillo Jillyn Thelbert, OT 07/13/2024, 12:08 PM

## 2024-07-14 ENCOUNTER — Telehealth: Payer: Self-pay

## 2024-07-14 NOTE — Telephone Encounter (Signed)
 Copied from CRM [id number]. Topic: Clinical - Request for Lab/Test Order >> Jul 13, 2024  4:14 PM Delon DASEN wrote: Reason for CRM: Eye doctor needs an order for diabetic eye exam- fax [phone]

## 2024-07-18 ENCOUNTER — Other Ambulatory Visit: Payer: Self-pay | Admitting: Family Medicine

## 2024-07-18 NOTE — Telephone Encounter (Signed)
 Please schedule an in-office diabetic eye exam for this patient

## 2024-07-20 ENCOUNTER — Telehealth: Payer: Self-pay

## 2024-07-20 ENCOUNTER — Encounter (HOSPITAL_COMMUNITY): Payer: MEDICAID | Admitting: Occupational Therapy

## 2024-07-20 ENCOUNTER — Telehealth: Payer: Self-pay | Admitting: Family Medicine

## 2024-07-20 ENCOUNTER — Encounter: Payer: Self-pay | Admitting: Podiatry

## 2024-07-20 ENCOUNTER — Ambulatory Visit (INDEPENDENT_AMBULATORY_CARE_PROVIDER_SITE_OTHER): Payer: MEDICAID | Admitting: Podiatry

## 2024-07-20 DIAGNOSIS — B351 Tinea unguium: Secondary | ICD-10-CM | POA: Diagnosis not present

## 2024-07-20 DIAGNOSIS — E1142 Type 2 diabetes mellitus with diabetic polyneuropathy: Secondary | ICD-10-CM | POA: Diagnosis not present

## 2024-07-20 NOTE — Telephone Encounter (Signed)
 Letter created and faxed.

## 2024-07-20 NOTE — Telephone Encounter (Signed)
 Copied from CRM [id number]. Topic: General - Other >> Jul 20, 2024  1:32 PM Santiya F wrote: Reason for CRM: Claudina Lunger, from [employer] is calling in because she says patient was seen in June and needs a letter stating patient is not fit for work. She says the letter needs to include the timeframe that patient needs to be out for. If there are any questions, Anitrua can be reached at [phone] ext [phone], her fax number is:  [phone]

## 2024-07-20 NOTE — Telephone Encounter (Signed)
 Duplicate message- see previous message already sent to [facility]

## 2024-07-20 NOTE — Progress Notes (Signed)
 Subjective:  Patient ID: Heidi [name], female    DOB: 1971-04-05,  MRN: [id number] HPI Chief Complaint  Patient presents with   Diabetes    I have tingling in my toes.  Sometime I hear a popping noise.  Saw Del Orve Singer, Morgan, FNP - 06/14/24; A1c - 6.6 N - tingling/ popping noise L - bilateral foot D - 4 mos. O - suddenly, gotten worse C - Diabetic, tingling, popping noise A - ? Sciatic nerve T - none    53 y.o. female presents with the above complaint.   Denies fever chills nausea vomiting muscle aches pains.  She does relate back pain and sciatic type pain along the left posterior thigh and neuropathic pain in the legs and feet.  Currently taking gabapentin  and pregabalin  for this as well as hydrocodone .  Past Medical History:  Diagnosis Date   Alcohol withdrawal (HCC)    Anxiety    Cervical radiculopathy    Depression    Diabetes mellitus without complication (HCC)    GERD (gastroesophageal reflux disease)    Hypertension    Past Surgical History:  Procedure Laterality Date   BIOPSY  11/30/2023   Procedure: BIOPSY;  Surgeon: Cindie Carlin POUR, DO;  [facility];  Service: Endoscopy;;   BRAIN SURGERY     COLONOSCOPY WITH PROPOFOL  N/A 11/30/2023   Surgeon: Cindie Carlin K, DO;   5 mm polyp resected and retrieved, stool throughout the entire colon.  Pathology with tubular adenoma.  Recommended 5-year surveillance.   ESOPHAGOGASTRODUODENOSCOPY (EGD) WITH PROPOFOL  N/A 11/30/2023   Surgeon: Cindie Carlin POUR, DO;  Small hiatal hernia, gastritis biopsied, duodenitis.  Pathology with reactive gastropathy and focal erosion, negative for H. pylori. Recommended PPI BID.   FLEXIBLE SIGMOIDOSCOPY N/A 07/13/2023   Procedure: FLEXIBLE SIGMOIDOSCOPY;  Surgeon: Eartha Angelia Sieving, MD;  [facility];  Service: Gastroenterology;  Laterality: N/A;   POLYPECTOMY  11/30/2023   Procedure: POLYPECTOMY;  Surgeon: Cindie Carlin POUR, DO;  [facility];  Service: Endoscopy;;    Current Outpatient Medications:    Accu-Chek Softclix Lancets lancets, 100 each by Other route 3 (three) times daily., Disp: 100 each, Rfl: 3   albuterol  (PROVENTIL ) (2.5 MG/3ML) 0.083% nebulizer solution, Take 3 mLs (2.5 mg total) by nebulization every 6 (six) hours as needed for wheezing or shortness of breath., Disp: 150 mL, Rfl: 1   Blood Glucose Monitoring Suppl DEVI, 1 each by Does not apply route in the morning, at noon, and at bedtime. May substitute to any manufacturer covered by patient's insurance. (Patient taking differently: 1 each by Does not apply route in the morning, at noon, and at bedtime. May substitute to any manufacturer covered by patient's insurance.), Disp: 1 each, Rfl: 0   Blood Pressure Monitor DEVI, 1 each by Does not apply route daily., Disp: 1 each, Rfl: 0   busPIRone  (BUSPAR ) 30 MG tablet, Take 1 tablet (30 mg total) by mouth in the morning and at bedtime., Disp: 60 tablet, Rfl: 3   cloNIDine  (CATAPRES  - DOSED IN MG/24 HR) 0.2 mg/24hr patch, APPLY 1 PATCH TO THE SKIN ONCE WEEKLY., Disp: 4 patch, Rfl: 4   cyclobenzaprine  (FLEXERIL ) 10 MG tablet, Take 1 tablet (10 mg total) by mouth 2 (two) times daily as needed for muscle spasms., Disp: 40 tablet, Rfl: 1   folic acid  (FOLVITE ) 1 MG tablet, Take 1 tablet (1 mg total) by mouth daily., Disp: 30 tablet, Rfl: 3   furosemide  (LASIX ) 20  MG tablet, Take 1 tablet (20 mg total) by mouth daily., Disp: 90 tablet, Rfl: 1   gabapentin  (NEURONTIN ) 400 MG capsule, TAKE ONE CAPSULE BY MOUTH THREE TIMES DAILY, Disp: 60 capsule, Rfl: 5   glucose blood (ACCU-CHEK GUIDE TEST) test strip, Use as instructed three times daily dx e11.65, Disp: 100 each, Rfl: 3   HYDROcodone -acetaminophen  (NORCO/VICODIN) 5-325 MG tablet, Take 1 tablet by mouth every 4 (four) hours as needed., Disp: , Rfl:    levocetirizine (XYZAL ) 5 MG tablet, Take 1 tablet (5 mg total) by mouth every evening., Disp: 30 tablet, Rfl: 5   lidocaine   (LIDODERM ) 5 %, Place 1 patch onto the skin daily. Remove & Discard patch within 12 hours or as directed by MD, Disp: 30 patch, Rfl: 2   lidocaine  (XYLOCAINE ) 2 % solution, Use as directed 15 mLs in the mouth or throat as needed., Disp: 100 mL, Rfl: 0   metFORMIN  (GLUCOPHAGE ) 500 MG tablet, Take 1 tablet (500 mg total) by mouth 2 (two) times daily with a meal., Disp: 90 tablet, Rfl: 3   metoprolol  tartrate (LOPRESSOR ) 25 MG tablet, Take 0.5 tablets (12.5 mg total) by mouth 2 (two) times daily., Disp: 60 tablet, Rfl: 5   Misc. Devices (GOJJI WEIGHT SCALE) MISC, Daily Weight, Disp: 1 each, Rfl: 0   naltrexone  (DEPADE) 50 MG tablet, Take 1 tablet (50 mg total) by mouth daily., Disp: 30 tablet, Rfl: 2   olmesartan  (BENICAR ) 20 MG tablet, Take 1 tablet (20 mg total) by mouth daily., Disp: 30 tablet, Rfl: 6   ondansetron  (ZOFRAN ) 4 MG tablet, Take 1 tablet (4 mg total) by mouth every 8 (eight) hours as needed for nausea or vomiting., Disp: 20 tablet, Rfl: 3   pantoprazole  (PROTONIX ) 40 MG tablet, Take 1 tablet (40 mg total) by mouth 2 (two) times daily., Disp: 60 tablet, Rfl: 11   polyethylene glycol powder (GLYCOLAX /MIRALAX ) 17 GM/SCOOP powder, Take 17 g by mouth daily. Mix with 8 oz of water ., Disp: 510 g, Rfl: 3   potassium chloride  (KLOR-CON ) 10 MEQ tablet, Take 10 mEq by mouth daily., Disp: , Rfl:    potassium chloride  SA (KLOR-CON  M) 20 MEQ tablet, Take 2 tablets (40 mEq total) by mouth daily for 4 days., Disp: 8 tablet, Rfl: 0   predniSONE  (DELTASONE ) 10 MG tablet, Take 1 tablet (10 mg total) by mouth 3 (three) times daily., Disp: 42 tablet, Rfl: 0   pregabalin  (LYRICA ) 50 MG capsule, Take 1 capsule (50 mg total) by mouth 2 (two) times daily as needed., Disp: 60 capsule, Rfl: 0   rosuvastatin  (CRESTOR ) 40 MG tablet, Take 1 tablet (40 mg total) by mouth daily., Disp: 90 tablet, Rfl: 1   sertraline  (ZOLOFT ) 25 MG tablet, Take 1 tablet (25 mg total) by mouth daily., Disp: 30 tablet, Rfl: 3    varenicline  (CHANTIX ) 0.5 MG tablet, TAKE ONE TABLET BY MOUTH TWICE DAILY, Disp: 56 tablet, Rfl: 0   VENTOLIN  HFA 108 (90 Base) MCG/ACT inhaler, Inhale 2 puffs into the lungs every 6 (six) hours as needed for wheezing or shortness of breath., Disp: 18 g, Rfl: 2  No Known Allergies Review of Systems Objective:  There were no vitals filed for this visit.  General: Well developed, nourished, in no acute distress, alert and oriented x3   Dermatological: Skin is warm, dry and supple bilateral. Nails x 10 are well maintained; remaining integument appears unremarkable at this time. There are no open sores, no preulcerative lesions, no rash or signs  of infection present.  Vascular: Dorsalis Pedis artery and Posterior Tibial artery pedal pulses are +1/4 right lower extremity +2/4 left lower extremity with immedate capillary fill time. Pedal hair growth present. No varicosities and no lower extremity edema present bilateral.   Neruologic: Grossly intact via light touch bilateral. Vibratory intact via tuning fork bilateral. Protective threshold with Semmes Wienstein monofilament diminished.  o all pedal sites bilateral. Patellar and Achilles deep tendon reflexes 2+ bilateral. No Babinski or clonus noted bilateral.   Musculoskeletal: No gross boney pedal deformities bilateral. No pain, crepitus, or limitation noted with foot and ankle range of motion bilateral. Muscular strength: 4/5  in all groups tested bilateral.  Gait: Assisted by walker   Radiographs:  None taken  Assessment & Plan:   Assessment: Peripheral neuropathy most likely complicated by radiculopathy sciatic disease as well as peripheral neuropathy.  Plan: Discussed etiology pathology and surgical therapies I explained to her that with no reproducible pain bilateral most likely this is sciatica or radiculopathy.  I recommended that she follow-up with her neurologist at her pain clinic.     Armina Galloway T. [place], [place]

## 2024-07-20 NOTE — Telephone Encounter (Signed)
 To Whom It May Concern,  This letter is to confirm that my patient, Heidi [name] (DOB: 1971/05/26), is under my care for ongoing management of lumbar facet arthropathy and lumbar spinal stenosis with neurogenic claudication.  Due to the severity of her condition, Heidi [name] is experiencing significant lower back pain, impaired mobility, and neurologic symptoms that prevent her from safely and effectively performing work-related duties, particularly those involving prolonged standing, walking, sitting, or physical exertion.  At this time, Heidi [name] is medically unable to work. Her work restrictions are effective immediately and will remain in place until an undetermined date, pending further evaluation and clinical improvement.  Please feel free to contact my office for any additional information or documentation.  Sincerely,

## 2024-07-20 NOTE — Telephone Encounter (Signed)
 Copied from CRM [id number]. Topic: General - Other >> Jul 20, 2024 12:24 PM Montie POUR wrote: Reason for CRM:  Ms. Borys needs a letter to Ms. Gladis at [employer] that Ms. Minella is not able to work due to health reasons. Please call Ms. Rosalynn when letter is ready. She will call back with fax number. Thanks

## 2024-07-20 NOTE — Telephone Encounter (Signed)
 Patient notified

## 2024-07-24 ENCOUNTER — Telehealth: Payer: Self-pay | Admitting: Family Medicine

## 2024-07-24 ENCOUNTER — Telehealth: Payer: Self-pay

## 2024-07-24 DIAGNOSIS — Z122 Encounter for screening for malignant neoplasm of respiratory organs: Secondary | ICD-10-CM

## 2024-07-24 DIAGNOSIS — Z87891 Personal history of nicotine dependence: Secondary | ICD-10-CM

## 2024-07-24 NOTE — Telephone Encounter (Signed)
 FNS Information Needed Noted Copied Scanned Original in provider box Copy at front desk

## 2024-07-24 NOTE — Telephone Encounter (Signed)
 Lung Cancer Screening Narrative/Criteria Questionnaire (Cigarette Smokers Only- No Cigars/Pipes/vapes)   Heidi [name]   SDMV:07/26/2024 with Natalie at 10:00 am    53 y.o.                                      LDCT: 07/28/2024 at 7:30 pm at [facility] Phone: [phone]  Lung Screening Narrative (confirm age 8-77 yrs Medicare / 50-80 yrs Private pay insurance)   Insurance information:[employer]   Referring Provider: Darlean, MD   This screening involves an initial phone call with a team member from our program. It is called a shared decision making visit. The initial meeting is required by  insurance and Medicare to make sure you understand the program. This appointment takes about 15-20 minutes to complete. You will complete the screening scan at your scheduled date/time.  This scan takes about 5-10 minutes to complete. You can eat and drink normally before and after the scan.  Criteria questions for Lung Cancer Screening:   Are you a current or former smoker? Former Age began smoking: 15   If you are a former smoker, what year did you quit smoking? Quit 03/21/2024 and 5 additional years (within 15 yrs)   To calculate your smoking history, I need an accurate estimate of how many packs of cigarettes you smoked per day and for how many years. (Not just the number of PPD you are now smoking)   Years smoking 32 x Packs per day 2 = Pack years 71   (at least 20 pack yrs)   (Make sure they understand that we need to know how much they have smoked in the past, not just the number of PPD they are smoking now)  Do you have a personal history of cancer?  No    Do you have a family history of cancer? No  Are you coughing up blood?  No  Have you had unexplained weight loss of 15 lbs or more in the last 6 months? No  It looks like you meet all criteria.  When would be a good time for us  to schedule you for this screening?   Additional information: N/A

## 2024-07-25 ENCOUNTER — Ambulatory Visit: Payer: MEDICAID | Admitting: Podiatry

## 2024-07-25 NOTE — Telephone Encounter (Signed)
In provider's inbox.

## 2024-07-26 ENCOUNTER — Ambulatory Visit: Payer: MEDICAID | Admitting: *Deleted

## 2024-07-26 ENCOUNTER — Ambulatory Visit (HOSPITAL_COMMUNITY): Payer: MEDICAID | Attending: Family Medicine | Admitting: Physical Therapy

## 2024-07-26 DIAGNOSIS — M25531 Pain in right wrist: Secondary | ICD-10-CM | POA: Diagnosis present

## 2024-07-26 DIAGNOSIS — M25631 Stiffness of right wrist, not elsewhere classified: Secondary | ICD-10-CM | POA: Diagnosis present

## 2024-07-26 DIAGNOSIS — G8929 Other chronic pain: Secondary | ICD-10-CM | POA: Diagnosis present

## 2024-07-26 DIAGNOSIS — Z87891 Personal history of nicotine dependence: Secondary | ICD-10-CM

## 2024-07-26 DIAGNOSIS — R29898 Other symptoms and signs involving the musculoskeletal system: Secondary | ICD-10-CM | POA: Insufficient documentation

## 2024-07-26 DIAGNOSIS — M545 Low back pain, unspecified: Secondary | ICD-10-CM | POA: Insufficient documentation

## 2024-07-26 DIAGNOSIS — M6281 Muscle weakness (generalized): Secondary | ICD-10-CM | POA: Insufficient documentation

## 2024-07-26 NOTE — Progress Notes (Addendum)
  Virtual Visit via Telephone Note  I connected with Heidi [name] on 07/26/24 at 10:00 AM EDT by telephone and verified that I am speaking with the correct person using two identifiers.  Location: Patient: Heidi [name] Provider: Laneta Speaks, RN   I discussed the limitations, risks, security and privacy concerns of performing an evaluation and management service by telephone and the availability of in person appointments. I also discussed with the patient that there may be a patient responsible charge related to this service. The patient expressed understanding and agreed to proceed.   Shared Decision Making Visit Lung Cancer Screening Program [phone])   Eligibility: Age 53 y.o. Pack Years Smoking History Calculation 80 (# packs/per year x # years smoked) Recent History of coughing up blood  no Unexplained weight loss? no ( >Than 15 pounds within the last 6 months ) Prior History Lung / other cancer no (Diagnosis within the last 5 years already requiring surveillance chest CT Scans). Smoking Status Former Smoker Former Smokers: Years since quit: < 1 year  Quit Date: 03/2024  Visit Components: Discussion included one or more decision making aids. yes Discussion included risk/benefits of screening. yes Discussion included potential follow up diagnostic testing for abnormal scans. yes Discussion included meaning and risk of over diagnosis. yes Discussion included meaning and risk of False Positives. yes Discussion included meaning of total radiation exposure. yes  Counseling Included: Importance of adherence to annual lung cancer LDCT screening. yes Impact of comorbidities on ability to participate in the program. yes Ability and willingness to under diagnostic treatment. yes  Smoking Cessation Counseling: Current Smokers:  Discussed importance of smoking cessation. yes Information about tobacco cessation classes and interventions provided to patient. yes Patient  provided with ticket for LDCT Scan. no Symptomatic Patient. no  Counseling(Intermediate counseling: > three minutes) 99406 Diagnosis Code: Tobacco Use Z72.0 Asymptomatic Patient yes  Counseling (Intermediate counseling: > three minutes counseling) H9563 Former Smokers:  Discussed the importance of maintaining cigarette abstinence. yes Diagnosis Code: Personal History of Nicotine  Dependence. S12.108 Information about tobacco cessation classes and interventions provided to patient. Yes Patient provided with ticket for LDCT Scan. no Written Order for Lung Cancer Screening with LDCT placed in Epic. Yes (CT Chest Lung Cancer Screening Low Dose W/O CM) [id number] Z12.2-Screening of respiratory organs Z87.891-Personal history of nicotine  dependence   Laneta Speaks, RN

## 2024-07-26 NOTE — Patient Instructions (Signed)

## 2024-07-26 NOTE — Therapy (Signed)
 OUTPATIENT PHYSICAL THERAPY THORACOLUMBAR TREATMENT/DISCHARGE PHYSICAL THERAPY DISCHARGE SUMMARY  Visits from Start of Care: 11  Current functional level related to goals / functional outcomes: See below   Remaining deficits: See below   Education / Equipment: HEP   Patient agrees to discharge. Patient goals were not met. Patient is being discharged due to non compliance with treatment plan.     Patient Name: Heidi [name] MRN: [id number] DOB:1970/12/28, 53 y.o., female Today's Date: 07/26/2024  END OF SESSION:  PT End of Session - 07/26/24 0950     Visit Number 11    Number of Visits 16    Date for PT Re-Evaluation 06/22/24    Authorization Type [employer]    Authorization Time Period 8 visits from 6/5 to 12/2;    Authorization - Visit Number 3    Authorization - Number of Visits 8    Progress Note Due on Visit 19    Activity Tolerance Patient tolerated treatment well    Behavior During Therapy [facility] for tasks assessed/performed               Past Medical History:  Diagnosis Date   Alcohol withdrawal (HCC)    Anxiety    Cervical radiculopathy    Depression    Diabetes mellitus without complication (HCC)    GERD (gastroesophageal reflux disease)    Hypertension    Past Surgical History:  Procedure Laterality Date   BIOPSY  11/30/2023   Procedure: BIOPSY;  Surgeon: Cindie Carlin POUR, DO;  [facility];  Service: Endoscopy;;   BRAIN SURGERY     COLONOSCOPY WITH PROPOFOL  N/A 11/30/2023   Surgeon: Cindie Carlin K, DO;   5 mm polyp resected and retrieved, stool throughout the entire colon.  Pathology with tubular adenoma.  Recommended 5-year surveillance.   ESOPHAGOGASTRODUODENOSCOPY (EGD) WITH PROPOFOL  N/A 11/30/2023   Surgeon: Cindie Carlin POUR, DO;  Small hiatal hernia, gastritis biopsied, duodenitis.  Pathology with reactive gastropathy and focal erosion, negative for H. pylori. Recommended PPI BID.   FLEXIBLE SIGMOIDOSCOPY N/A  07/13/2023   Procedure: FLEXIBLE SIGMOIDOSCOPY;  Surgeon: Eartha Angelia Sieving, MD;  [facility];  Service: Gastroenterology;  Laterality: N/A;   POLYPECTOMY  11/30/2023   Procedure: POLYPECTOMY;  Surgeon: Cindie Carlin POUR, DO;  [facility];  Service: Endoscopy;;   Patient Active Problem List   Diagnosis Date Noted   Type 2 diabetes mellitus (HCC) 06/16/2024   Dysphagia 05/24/2024   Anxiety and depression 05/24/2024   Spinal stenosis of lumbar region with neurogenic claudication 05/11/2024   DOE (dyspnea on exertion) 04/17/2024   Former cigarette smoker 04/17/2024   Chest pain 02/08/2024   Lumbar facet arthropathy 12/28/2023   Chronic pain syndrome 12/28/2023   Cervical cancer screening 10/26/2023   Degenerative disc disease, lumbar 08/11/2023   Hospital discharge follow-up 07/29/2023   Laceration of spleen 07/24/2023   Ileus (HCC) 07/13/2023   Elevated LFTs 07/12/2023   Colonic obstruction (HCC) 07/12/2023   Hypothermia 07/09/2023   Hypokalemia 07/06/2023   Thrombocytopenia (HCC) 07/06/2023   Chronic back pain 05/18/2023   Bilateral knee pain 04/26/2023   Depression, recurrent (HCC) 04/26/2023   Alcohol use disorder 04/20/2023   Carpal tunnel syndrome of right wrist 04/20/2023   Tachycardia 04/20/2023   Transaminitis 04/06/2023   GERD (gastroesophageal reflux disease) 04/06/2023   Tobacco use disorder 04/06/2023   Alcohol withdrawal (HCC) 10/19/2022   Essential hypertension 10/19/2022   Nausea & vomiting 10/19/2022   Hypomagnesemia 10/19/2022  Cervical radicular pain 10/29/2010    PCP: Terry Wilhelmena Lloyd Hilario, FNP  REFERRING PROVIDER: Terry Wilhelmena Lloyd Hilario, FNP  REFERRING DIAG: M54.50 (ICD-10-CM) - Lumbar pain   Rationale for Evaluation and Treatment: Rehabilitation  THERAPY DIAG:  Pain in right wrist  Stiffness of right wrist, not elsewhere classified  Other symptoms and signs involving the musculoskeletal system  Muscle  weakness (generalized)  Low back pain, unspecified back pain laterality, unspecified chronicity, unspecified whether sciatica present  Chronic bilateral low back pain without sciatica  ONSET DATE: numerous years  SUBJECTIVE:                                                                                                                                                                                           SUBJECTIVE STATEMENT: 07/26/24:  pt returns after 3 weeks since last appt.  States no changes overall.  Continues to use her rollator for ambulation.       Eval:  Pt reporting she was coming here earlier last year but was told by upfront staff to, not come back until after December. Pt reporting constant back pain that has been severing limiting and is always in pain.   PERTINENT HISTORY:  Hx of knee pain, cervical pain, and see some bilateral UE numbness  PAIN:  Are you having pain? Yes: NPRS scale: 7/10 Pain location: low back/R hip Pain description: achy/dull Aggravating factors: walking/standing Relieving factors: resting/laying down  PRECAUTIONS: None  RED FLAGS: None   WEIGHT BEARING RESTRICTIONS: No  FALLS:  Has patient fallen in last 6 months? No   PATIENT GOALS: get out of pain  NEXT MD VISIT:   OBJECTIVE:  Note: Objective measures were completed at Evaluation unless otherwise noted.  DIAGNOSTIC FINDINGS:  MRI:L3-4 there is a mild disc bulge flattening the ventral thecal sac and small right foraminal disc extrusion. Moderate-severe bilateral facet arthropathy with small facet effusions. Moderate central canal stenosis. Severe right foraminal stenosis. Moderate left foraminal stenosis.  PATIENT SURVEYS:  Modified Oswestry 38/50 = 76%  05/25/24: 28/50 COGNITION: Overall cognitive status: Within functional limits for tasks assessed     SENSATION: WFL  POSTURE: rounded shoulders, forward head, increased lumbar lordosis, and anterior pelvic  tilt  PALPATION: Tender to touch bilateral lumbar paraspinals  LUMBAR ROM:   AROM eval 04/06/24 06/29/24  Flexion 90% and painful  Fingertips to 4 above ankles  Extension 100% and painful  70% available no pain  Right lateral flexion  100% c/o tightness on Lt QL region, hand past knee   Left lateral flexion  100% painfree with hand past knee   Right rotation  50%  painful center of  and c/o tightness   Left rotation  50% painful and c/o tightness    (Blank rows = not tested)  LOWER EXTREMITY ROM:     Active  Right eval Left eval  Hip flexion    Hip extension    Hip abduction    Hip adduction    Hip internal rotation    Hip external rotation    Knee flexion    Knee extension    Ankle dorsiflexion    Ankle plantarflexion    Ankle inversion    Ankle eversion     (Blank rows = not tested)  LOWER EXTREMITY MMT:    MMT Right eval Left eval Right 06/29/24 Left 06/29/24  Hip flexion 4- 4- 4+ 4+  Hip extension 3+ 4-    Hip abduction 3+ 4-    Hip adduction      Hip internal rotation      Hip external rotation      Knee flexion      Knee extension   5 5  Ankle dorsiflexion   4+ 4+  Ankle plantarflexion      Ankle inversion      Ankle eversion       (Blank rows = not tested)  FUNCTIONAL TESTS:  Evaluation:  30 Second Chair Stand Test: 10x 30 Second Chair Stand Test: 9x Norms:   Age 51-64 64-69 70-74 75-79 80-84 85-89 90-94  Women 15 15 14 13 12 11 9   Men 17 16 15 14 13 11 9   2  minute walk test: 228ft  05/17/24:  with RW 310 feet 05/25/2024: : 130 feet and then needed sitting break, today is a rough day.  GAIT: Distance walked: > 38ft with total ambulation throughout session Assistive device utilized: None Level of assistance: Complete Independence Comments: externally rotated BLE during swing and stance phase with antalgia via RLE  TREATMENT DATE:  07/26/24 Attempted to complete test measures Goal review Appt/NS policy  explanation   07/04/24 UBE 2'fwd/2'bkwd Standing: hip excursions forward lunges onto 4 step no UE 10X each  Hip abduction 10X each  Hip extension 10X each Gait with [facility]  06/29/24 Progress note 2 MWT held due to SOB 30 sec sit to stand 6 x (short of breath and feels dizzy) Modified Oswestry 30/50 60%(better than eval but slightly worse than last progress note) Discussed with patient the importance of weighing daily when you have cardiac issues BP 162/90 left arm; O2 96-97% Lumbar mobility and MMT's see above Lumbar extension over table  Discussion of benefit of aquatic therapy  06/01/24: NuStep, seat 7, 5', breaks throughout, level 1 Ambulation with NBQC, 139 ft LTR, 2 set of 10 reps bilaterally, pt cued to remain in pain free ROM [employer], 15 holds 3x each LE Hip Abd + TA contract w/ MHP donned, RTB around knees, 2x10 SLR + TA contract w/ MHP donned, 2x10 ea LE  05/25/2024  Therapeutic Exercise: -Supine bridges 1 set of 10 reps, 3 second holds, symptomatic, pt cued for max hip extension -Heel Raises, 1 set of 20 reps, pt requires demonstration -Standing 3 way hip 1 sets 5 reps, bilaterally, pt cued for upright trunk and maintaining of neutral spine -LTR 2 set of 10 reps bilaterally, pt cued to remain in pain free ROM -Childs pose to prone press up stretch, 3 second holds, 8 reps (modified position require for prone position) -Cat/Cow stretch, 3 second holds, 6 reps, pt requires cueing for increased core  activation   05/17/24 Nustep seat 9 UE/LE level 3 atl beach 5 minutes Standing: heelraises 20X  Toeraises 20X  Hip abduction 2X10  Hip extension 2X10  Alt marching 2X10  Hamstrings stretch 2X30 each on 12 step  with RW 310 feet   PATIENT EDUCATION:  Education details: PT Evaluation, findings, prognosis, frequency, attendance policy, and HEP. Person educated: Patient Education method: Medical illustrator Education comprehension: verbalized  understanding  HOME EXERCISE PROGRAM: Access Code: [email or web address] URL: [email or web address] Date: 05/09/2024 Prepared by: [facility] - Re[facility]  Exercises - Supine Scapular Retraction  - 2 x daily - 7 x weekly - 1 sets - 10 reps - 5 sec hold - Heel Raises with Counter Support  - 2 x daily - 7 x weekly - 1 sets - 10 reps - Gastroc Stretch on Wall  - 2 x daily - 7 x weekly - 1 sets - 3 reps - 20 sec hold Access Code: [email or web address] URL: [email or web address] Date: 03/31/2024 Prepared by: Omega Bottcher  Exercises - Supine Transversus Abdominis Bracing - Hands on Stomach  - 1 x daily - 7 x weekly - 3 sets - 10 reps - Supine Bridge  - 1 x daily - 7 x weekly - 3 sets - 10 reps - 5 hold - Supine Posterior Pelvic Tilt  - 1 x daily - 7 x weekly - 3 sets - 10 reps - 10 hold - Clamshell  - 1 x daily - 7 x weekly - 3 sets - 10 reps - 5 hold - Supine Piriformis Stretch with Foot on Ground  - 1 x daily - 7 x weekly - 3 sets - 3 reps - 30 hold  04/06/24:  Decompression  04/26/24- Hooklying Isometric Hip Abduction with Belt  - 2 x daily - 7 x weekly - 1 sets - 10 reps - 5 hold Walking program  05/17/24 Hip abduction, extension marching with RTB Postural strengthening RTB (sheet in filing cabinet) Access Code: [email or web address] URL: [email or web address] Date: 05/25/2024 Prepared by: Lang Ada  Exercises - Child's Pose Stretch  - 1 x daily - 7 x weekly - 3 sets - 10 reps - Cat Cow  - 1 x daily - 7 x weekly - 3 sets - 10 reps  ASSESSMENT:  CLINICAL IMPRESSION: Pt returned today following 3 weeks of absence and in need for re certification completion to continue with therapy.  Pt was confused regarding attendance policy and removal of future appointments.  Therapist explained to pt our NS policy and importance of regular scheduled visits to make most progress/gains.  Pt was upset and walked out of room without AD but shortly returned to retrieve her walker.  Pt would not stay to  complete test measures and failed to make further appts as instructed by therapist to return to therapy so will be discharged at this time due to noncompliance.  OBJECTIVE IMPAIRMENTS: decreased activity tolerance, decreased balance, decreased mobility, decreased ROM, decreased strength, hypomobility, postural dysfunction, and pain.   ACTIVITY LIMITATIONS: carrying, lifting, bending, sitting, standing, squatting, sleeping, stairs, transfers, bed mobility, toileting, dressing, reach over head, hygiene/grooming, and locomotion level  PARTICIPATION LIMITATIONS: meal prep, cleaning, laundry, driving, shopping, community activity, occupation, and yard work  PERSONAL FACTORS: Age and Behavior pattern are also affecting patient's functional outcome.   REHAB POTENTIAL: Fair poor attendace  CLINICAL DECISION MAKING: Stable/uncomplicated  EVALUATION COMPLEXITY: Low   GOALS: Goals reviewed with patient? No  SHORT TERM GOALS: Target date: 06/15/24  Pt will be independent with HEP  in order to demonstrate participation in Physical Therapy POC.  Baseline: Goal status: IN PROGRESS  2.  Pt will report 4/10 pain with mobility in order to demonstrate improved pain with ADLs.  Baseline:  Goal status: NOT MET  LONG TERM GOALS: Target date: 06/22/2024  Pt will improve 30 Second Chair Stand test by at least 5 in order to demonstrate improved functional strength to return to desired activities.  Baseline: see objective.  Goal status: NOT MET  2.  Pt will improve 2 MWT by at least 122ft in order to demonstrate improved functional ambulatory capacity in community setting.  Baseline: see objective.  Goal status: NOT MET  3.  Pt will improve Modified Oswestry score by 15 points in order to demonstrate improved pain with functional goals and outcomes. Baseline: see objective.  Goal status: NOT MET  4.  Pt will report 3/10 pain with mobility in order to demonstrate reduced pain with ADLs lasting greater  than 30 minutes.  Baseline: see objective.  Goal status: NOT MET  PLAN:  PT FREQUENCY: 1x/week  PT DURATION: 8 weeks *scheduling one visit at a time*  PLANNED INTERVENTIONS: 97164- PT Re-evaluation, 97750- Physical Performance Testing, 97110-Therapeutic exercises, 97530- Therapeutic activity, 97112- Neuromuscular re-education, 97535- Self Care, 02859- Manual therapy, [phone]- Gait training, [phone]- Electrical stimulation (unattended), [phone]- Electrical stimulation (manual), Patient/Family education, Balance training, Stair training, Taping, Dry Needling, Joint mobilization, Joint manipulation, Spinal manipulation, Spinal mobilization, Cryotherapy, and Moist heat.  PLAN FOR NEXT SESSION: one visit at a time due to attendance. Discharge due to noncompliance   10:05 AM, 07/26/24 Greig KATHEE Fuse, PTA/CLT [facility] Ph: [phone]

## 2024-07-28 ENCOUNTER — Ambulatory Visit (HOSPITAL_COMMUNITY): Payer: MEDICAID

## 2024-07-28 ENCOUNTER — Telehealth: Payer: Self-pay | Admitting: Family Medicine

## 2024-07-28 NOTE — Telephone Encounter (Signed)
 Copied from CRM #[id number]. Topic: Referral - Request for Referral >> Jul 28, 2024  9:25 AM Elle L wrote: Did the patient discuss referral with their provider in the last year? Yes  Appointment offered? Yes, she states they already discussed it at her last appointment.   Type of order/referral and detailed reason for visit: Physical Therapy for her scoliosis, sciatic nerve and degenerative disc disease.   Preference of office, provider, location: [facility] [address] [place], [place] [address]  If referral order, have you been seen by this specialty before? Yes  Can we respond through MyChart? No

## 2024-08-01 ENCOUNTER — Ambulatory Visit (HOSPITAL_COMMUNITY): Admission: RE | Admit: 2024-08-01 | Payer: MEDICAID | Source: Home / Self Care | Admitting: Internal Medicine

## 2024-08-01 ENCOUNTER — Encounter (HOSPITAL_COMMUNITY): Admission: RE | Payer: Self-pay | Source: Home / Self Care

## 2024-08-01 ENCOUNTER — Telehealth: Payer: Self-pay | Admitting: *Deleted

## 2024-08-01 ENCOUNTER — Encounter (HOSPITAL_COMMUNITY): Payer: Self-pay | Admitting: Anesthesiology

## 2024-08-01 SURGERY — EGD (ESOPHAGOGASTRODUODENOSCOPY)
Anesthesia: Choice

## 2024-08-01 NOTE — Telephone Encounter (Signed)
 Per Anitra in endo needs to reschedule he procedure   Called pt to reschedule EGD/ED with Dr. Cindie. She reports she is not at home and will call back to reschedule

## 2024-08-02 ENCOUNTER — Ambulatory Visit (HOSPITAL_COMMUNITY): Admission: RE | Admit: 2024-08-02 | Payer: MEDICAID | Source: Ambulatory Visit

## 2024-08-03 ENCOUNTER — Encounter (HOSPITAL_COMMUNITY): Payer: MEDICAID | Admitting: Physical Therapy

## 2024-08-03 ENCOUNTER — Other Ambulatory Visit: Payer: Self-pay | Admitting: Family Medicine

## 2024-08-03 ENCOUNTER — Ambulatory Visit: Payer: MEDICAID | Admitting: Orthopedic Surgery

## 2024-08-03 ENCOUNTER — Encounter: Payer: Self-pay | Admitting: Family Medicine

## 2024-08-03 NOTE — Telephone Encounter (Signed)
 Form faxed back. Copy sent to scan

## 2024-08-03 NOTE — Telephone Encounter (Signed)
 Contacted patient to communicate form has been faxed and copy will be available for pick up at the front desk.  Left vm to return call if patient has additional questions.

## 2024-08-04 ENCOUNTER — Encounter (HOSPITAL_COMMUNITY): Payer: MEDICAID

## 2024-08-04 ENCOUNTER — Ambulatory Visit: Payer: Self-pay

## 2024-08-04 ENCOUNTER — Other Ambulatory Visit: Payer: Self-pay | Admitting: Family Medicine

## 2024-08-04 LAB — GLUCOSE, POCT (MANUAL RESULT ENTRY): POC Glucose: 240 mg/dL — AB (ref 70–99)

## 2024-08-04 NOTE — Progress Notes (Unsigned)
 Nursing Intake Note - [facility]  Chief Complaint: No chief complaint on file.   Living Situation: Unknown Insurance Status:  Medicaid   New Patient Status:  [x]  New to [employer] reviewed  HIPAA form signed and documented  Consent for digital charting: [x]  Signed []  Not Signed  Additional Notes:  Vital signs taken and entered in flowsheet  Interpreter services: []  Needed [x]  Not Needed  Patient oriented to mobile clinic services and process  SDOH screening completed  Referral to provider: []  Needed [x]  Not Needed  RN Interventions Provided:  [x]  Health education (e.g., chronic disease, hygiene, nutrition)  []  Wound care performed  []  Flu vaccine administered  [x]  Other: Diabetic education provided to patient. Pt consuming regular sodas 3-5 times a day. Encouraged pt to drink more water  and cut back on the amount of sodas she drinks or choose diet soda.

## 2024-08-04 NOTE — Telephone Encounter (Signed)
 Form in completed folder at front folder

## 2024-08-05 ENCOUNTER — Other Ambulatory Visit: Payer: Self-pay | Admitting: Family Medicine

## 2024-08-06 ENCOUNTER — Telehealth: Payer: Self-pay | Admitting: Orthopedic Surgery

## 2024-08-07 NOTE — Telephone Encounter (Signed)
 Please Review

## 2024-08-08 ENCOUNTER — Other Ambulatory Visit: Payer: Self-pay | Admitting: Family Medicine

## 2024-08-08 DIAGNOSIS — M48062 Spinal stenosis, lumbar region with neurogenic claudication: Secondary | ICD-10-CM

## 2024-08-08 NOTE — Telephone Encounter (Signed)
 Sent referral

## 2024-08-09 ENCOUNTER — Encounter (HOSPITAL_COMMUNITY): Payer: MEDICAID

## 2024-08-09 ENCOUNTER — Telehealth: Payer: Self-pay

## 2024-08-09 ENCOUNTER — Ambulatory Visit (INDEPENDENT_AMBULATORY_CARE_PROVIDER_SITE_OTHER): Payer: MEDICAID | Admitting: Podiatry

## 2024-08-09 ENCOUNTER — Encounter: Payer: Self-pay | Admitting: Podiatry

## 2024-08-09 DIAGNOSIS — E1142 Type 2 diabetes mellitus with diabetic polyneuropathy: Secondary | ICD-10-CM | POA: Diagnosis not present

## 2024-08-09 DIAGNOSIS — B351 Tinea unguium: Secondary | ICD-10-CM | POA: Diagnosis not present

## 2024-08-09 NOTE — Telephone Encounter (Signed)
 Copied from CRM [id number]. Topic: Clinical - Order For Equipment >> Aug 09, 2024 10:55 AM Delon DASEN wrote: Reason for CRM: Patient requesting a power chair- [phone]

## 2024-08-10 ENCOUNTER — Other Ambulatory Visit: Payer: Self-pay | Admitting: Family Medicine

## 2024-08-10 DIAGNOSIS — M48062 Spinal stenosis, lumbar region with neurogenic claudication: Secondary | ICD-10-CM

## 2024-08-10 NOTE — Telephone Encounter (Signed)
 Yes

## 2024-08-11 ENCOUNTER — Encounter (HOSPITAL_COMMUNITY): Payer: MEDICAID

## 2024-08-14 ENCOUNTER — Encounter: Payer: Self-pay | Admitting: Podiatry

## 2024-08-14 NOTE — Progress Notes (Signed)
  Subjective:  Patient ID: Heidi [name], female    DOB: 02/15/1971,  MRN: [id number]  Heidi [name] presents to clinic today for at risk foot care with history of peripheral neuropathy and painful thick toenails that are difficult to trim. Pain interferes with ambulation. Aggravating factors include wearing enclosed shoe gear. Pain is relieved with periodic professional debridement. Patient states she has pain on the top of both feet as well as her ankles for the past two months. Denies any epidode of trauma. Chief Complaint  Patient presents with   Diabetes    [facility] NIDDM A1C 6.6 Toenail trim. LOV with PCP 05/2024   New problem(s): None.   PCP is Del Wilhelmena Falter, Hilario, FNP.  No Known Allergies  Review of Systems: Negative except as noted in the HPI.  Objective: No changes noted in today's physical examination. There were no vitals filed for this visit. Heidi [name] is a pleasant 53 y.o. female in NAD. AAO x 3.  Vascular Examination: Capillary refill time immediate b/l. Palpable pedal pulses LLE and faintly palpable pulses RLE. Pedal hair present b/l. Pedal edema absent. No pain with calf compression b/l. Skin temperature gradient WNL b/l. No cyanosis or clubbing b/l. No ischemia or gangrene noted b/l.   Neurological Examination: Sensation grossly intact b/l with 10 gram monofilament. Vibratory sensation intact b/l.   Dermatological Examination: Pedal skin with normal turgor, texture and tone b/l.  No open wounds. No interdigital macerations.   Toenails 1-5 b/l thick, discolored, elongated with subungual debris and pain on dorsal palpation.   No corns, calluses, nor porokeratotic lesions.  Musculoskeletal Examination: Muscle strength 4/5 to all lower extremity muscle groups bilaterally. Palpable exostosis noted dorsum of midfoot joint of left lower extremity. No pain, crepitus or joint limitation noted with ROM bilateral LE. No gross bony deformities  bilaterally.  Radiographs: None  Last A1c:      Latest Ref Rng & Units 06/16/2024   11:30 AM 02/16/2024   11:23 AM  Hemoglobin A1C  Hemoglobin-A1c 4.8 - 5.6 % 6.6  7.3     Assessment/Plan: 1. Onychomycosis   2. Diabetic peripheral neuropathy [facility])   Patient was evaluated and treated. Patient to schedule appointment with Dr. Verta for evaluation of bilateral ankle/foot pain. All patient's and/or POA's questions/concerns addressed on today's visit. Toenails 1-5 debrided in length and girth without incident. Treatment was provided by assistant Andrez Manchester under my supervision. Continue foot and shoe inspections daily. Monitor blood glucose per PCP/Endocrinologist's recommendations. Continue soft, supportive shoe gear daily. Report any pedal injuries to medical professional. Call office if there are any questions/concerns.  Return in about 3 months (around 11/09/2024).  Delon LITTIE Merlin, DPM      [facility] LOCATION: [address] [place] [address]                   Office [phone]   [facility] LOCATION: [address] [place], [place] [address] Office [phone]

## 2024-08-15 ENCOUNTER — Encounter (HOSPITAL_COMMUNITY): Payer: MEDICAID

## 2024-08-17 ENCOUNTER — Other Ambulatory Visit: Payer: Self-pay

## 2024-08-17 MED ORDER — UNABLE TO FIND: 0 refills | Status: AC

## 2024-08-18 NOTE — Telephone Encounter (Signed)
 Faxed to [employer].

## 2024-08-24 ENCOUNTER — Other Ambulatory Visit: Payer: Self-pay | Admitting: Orthopedic Surgery

## 2024-08-24 DIAGNOSIS — M47812 Spondylosis without myelopathy or radiculopathy, cervical region: Secondary | ICD-10-CM

## 2024-08-28 ENCOUNTER — Telehealth: Payer: Self-pay | Admitting: Gastroenterology

## 2024-08-28 NOTE — Telephone Encounter (Signed)
 Request for records came from [facility]... records sent 08/28/24

## 2024-08-29 ENCOUNTER — Other Ambulatory Visit: Payer: Self-pay | Admitting: Family Medicine

## 2024-08-31 ENCOUNTER — Ambulatory Visit (INDEPENDENT_AMBULATORY_CARE_PROVIDER_SITE_OTHER): Payer: MEDICAID | Admitting: Podiatry

## 2024-08-31 ENCOUNTER — Encounter: Payer: Self-pay | Admitting: Podiatry

## 2024-08-31 DIAGNOSIS — E1142 Type 2 diabetes mellitus with diabetic polyneuropathy: Secondary | ICD-10-CM

## 2024-08-31 DIAGNOSIS — M67471 Ganglion, right ankle and foot: Secondary | ICD-10-CM | POA: Diagnosis not present

## 2024-08-31 DIAGNOSIS — M67472 Ganglion, left ankle and foot: Secondary | ICD-10-CM

## 2024-08-31 MED ORDER — TRIAMCINOLONE ACETONIDE 40 MG/ML IJ SUSP
40.0000 mg | Freq: Once | INTRAMUSCULAR | Status: AC
  Administered 2024-08-31: 40 mg

## 2024-08-31 NOTE — Progress Notes (Signed)
 She presents today concerned about 2 kn 1 on each foot just distal to the ankle.  States that these are just recently come up and she states that she did not go to see her neurologist.  She did go to the pain clinic.  Objective: Vital signs are stable alert oriented x 3.  Pulses are palpable.  There is no erythema edema salines drainage odor she does have 2 nonpulsatile masses measuring approximately centimeter each just lateral to the extensor hallucis longus tendon at the level of the top of the foot just distal to the ankle.  Assessment: Ganglion cyst dorsal aspect bilateral foot.  Plan: Injected ganglion cyst with dexamethasone  and local anesthetic.  Tolerated procedure well without complications.  This was injected bilaterally.

## 2024-09-01 ENCOUNTER — Ambulatory Visit (INDEPENDENT_AMBULATORY_CARE_PROVIDER_SITE_OTHER): Payer: MEDICAID | Admitting: Family Medicine

## 2024-09-01 VITALS — BP 110/72 | HR 94 | Resp 16 | Ht 63.0 in | Wt 198.0 lb

## 2024-09-01 DIAGNOSIS — Z7984 Long term (current) use of oral hypoglycemic drugs: Secondary | ICD-10-CM

## 2024-09-01 DIAGNOSIS — E1169 Type 2 diabetes mellitus with other specified complication: Secondary | ICD-10-CM

## 2024-09-01 DIAGNOSIS — E878 Other disorders of electrolyte and fluid balance, not elsewhere classified: Secondary | ICD-10-CM

## 2024-09-01 DIAGNOSIS — I10 Essential (primary) hypertension: Secondary | ICD-10-CM

## 2024-09-01 NOTE — Patient Instructions (Signed)

## 2024-09-01 NOTE — Assessment & Plan Note (Signed)
 Last Hemoglobin A1c: 6.6 Labs: Ordered today, results pending; will follow up accordingly. The patient reports adhering to prescribed medications: Metformin  500 mg twice daily   Reviewed non-pharmacological interventions, including a balanced diet rich in lean proteins, healthy fats, whole grains, and high-fiber vegetables. Emphasized reducing refined sugars and processed carbohydrates, and incorporating more fruits, leafy greens, and legumes. Education: Patient was educated on recognizing signs and symptoms of both hypoglycemia and hyperglycemia, and advised to seek emergency care if these symptoms occur. Follow-Up: Scheduled for follow-up in 3-4 months, or sooner if needed. Patient Understanding: The patient verbalized understanding of the care plan, and all questions were answered. Additional Care: Ophthalmology appointment pending, Podiatry exam WNL

## 2024-09-01 NOTE — Assessment & Plan Note (Signed)
 Vitals:   09/01/24 1309  BP: 110/72   Controlled, Continue Diltiazem  (Cardizem  CD) 120 mg once daily, Metoprolol  tartrate 12,5 mg twice daily, Clonidine  (Catapres ) 0.2 mg/24-hour patch  Continued discussion on DASH diet, low sodium diet and maintain a exercise routine for 150 minutes per week.

## 2024-09-01 NOTE — Progress Notes (Signed)
 Established Patient Office Visit   Subjective  Patient ID: Heidi [name], female    DOB: 03/09/1971  Age: 53 y.o. MRN: [id number]  Chief Complaint  Patient presents with   Diabetes    Follow up visit     She  has a past medical history of Alcohol withdrawal (HCC), Anxiety, Cervical radiculopathy, Depression, Diabetes mellitus without complication (HCC), GERD (gastroesophageal reflux disease), and Hypertension.  HPI Patient presents to the clinic for chronic follow up. For the details of today's visit, please refer to assessment and plan.   Review of Systems  Constitutional:  Negative for chills and fever.  Eyes:  Negative for blurred vision.  Respiratory:  Negative for shortness of breath.   Cardiovascular:  Negative for chest pain.  Neurological:  Negative for dizziness and headaches.      Objective:     BP 110/72   Pulse 94   Resp 16   Ht 5' 3 (1.6 m)   Wt 198 lb (89.8 kg)   BMI 35.07 kg/m  BP Readings from Last 3 Encounters:  09/01/24 110/72  08/04/24 (!) 152/96  06/19/24 119/76      Physical Exam Vitals reviewed.  Constitutional:      General: She is not in acute distress.    Appearance: Normal appearance. She is not ill-appearing, toxic-appearing or diaphoretic.  HENT:     Head: Normocephalic.  Eyes:     General:        Right eye: No discharge.        Left eye: No discharge.     Conjunctiva/sclera: Conjunctivae normal.  Cardiovascular:     Rate and Rhythm: Normal rate.     Pulses: Normal pulses.     Heart sounds: Normal heart sounds.  Pulmonary:     Effort: Pulmonary effort is normal. No respiratory distress.     Breath sounds: Normal breath sounds.  Abdominal:     General: Bowel sounds are normal.     Palpations: Abdomen is soft.     Tenderness: There is no abdominal tenderness. There is no right CVA tenderness, left CVA tenderness or guarding.  Skin:    General: Skin is warm and dry.  Neurological:     Mental Status: She is alert.   Psychiatric:        Mood and Affect: Mood normal.        Behavior: Behavior normal.      No results found for any visits on 09/01/24.  The 10-year ASCVD risk score (Arnett DK, et al., 2019) is: 3.1%    Assessment & Plan:  Type 2 diabetes mellitus with other specified complication, without long-term current use of insulin  (HCC) Assessment & Plan: Last Hemoglobin A1c: 6.6 Labs: Ordered today, results pending; will follow up accordingly. The patient reports adhering to prescribed medications: Metformin  500 mg twice daily   Reviewed non-pharmacological interventions, including a balanced diet rich in lean proteins, healthy fats, whole grains, and high-fiber vegetables. Emphasized reducing refined sugars and processed carbohydrates, and incorporating more fruits, leafy greens, and legumes. Education: Patient was educated on recognizing signs and symptoms of both hypoglycemia and hyperglycemia, and advised to seek emergency care if these symptoms occur. Follow-Up: Scheduled for follow-up in 3-4 months, or sooner if needed. Patient Understanding: The patient verbalized understanding of the care plan, and all questions were answered. Additional Care: Ophthalmology appointment pending, Podiatry exam WNL   Orders: -     Hemoglobin A1c  Electrolyte imbalance -     Basic  metabolic panel with GFR  Essential hypertension Assessment & Plan: Vitals:   09/01/24 1309  BP: 110/72   Controlled, Continue Diltiazem  (Cardizem  CD) 120 mg once daily, Metoprolol  tartrate 12,5 mg twice daily, Clonidine  (Catapres ) 0.2 mg/24-hour patch  Continued discussion on DASH diet, low sodium diet and maintain a exercise routine for 150 minutes per week.      Return in about 4 months (around 01/01/2025), or if symptoms worsen or fail to improve, for chronic follow-up.   Hilario Kidd Wilhelmena Falter, FNP

## 2024-09-02 LAB — BASIC METABOLIC PANEL WITH GFR
BUN/Creatinine Ratio: 16 (ref 9–23)
BUN: 13 mg/dL (ref 6–24)
CO2: 22 mmol/L (ref 20–29)
Calcium: 11.2 mg/dL — ABNORMAL HIGH (ref 8.7–10.2)
Chloride: 102 mmol/L (ref 96–106)
Creatinine, Ser: 0.8 mg/dL (ref 0.57–1.00)
Glucose: 110 mg/dL — ABNORMAL HIGH (ref 70–99)
Potassium: 3.6 mmol/L (ref 3.5–5.2)
Sodium: 143 mmol/L (ref 134–144)
eGFR: 89 mL/min/1.73 (ref >59)

## 2024-09-02 LAB — HEMOGLOBIN A1C
Est. average glucose Bld gHb Est-mCnc: 154 mg/dL
Hgb A1c MFr Bld: 7 % — ABNORMAL HIGH (ref 4.8–5.6)

## 2024-09-03 ENCOUNTER — Other Ambulatory Visit: Payer: Self-pay | Admitting: Family Medicine

## 2024-09-06 ENCOUNTER — Ambulatory Visit: Payer: Self-pay | Admitting: Family Medicine
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
## 2024-09-07 ENCOUNTER — Ambulatory Visit (HOSPITAL_COMMUNITY): Payer: MEDICAID | Attending: Family Medicine

## 2024-09-07 ENCOUNTER — Encounter (HOSPITAL_COMMUNITY): Payer: Self-pay

## 2024-09-07 ENCOUNTER — Other Ambulatory Visit: Payer: Self-pay

## 2024-09-07 DIAGNOSIS — M6281 Muscle weakness (generalized): Secondary | ICD-10-CM | POA: Diagnosis present

## 2024-09-07 DIAGNOSIS — M79605 Pain in left leg: Secondary | ICD-10-CM | POA: Diagnosis present

## 2024-09-07 DIAGNOSIS — R29898 Other symptoms and signs involving the musculoskeletal system: Secondary | ICD-10-CM | POA: Insufficient documentation

## 2024-09-07 DIAGNOSIS — M545 Low back pain, unspecified: Secondary | ICD-10-CM | POA: Diagnosis present

## 2024-09-07 DIAGNOSIS — M79604 Pain in right leg: Secondary | ICD-10-CM | POA: Diagnosis present

## 2024-09-07 DIAGNOSIS — Z7409 Other reduced mobility: Secondary | ICD-10-CM | POA: Diagnosis present

## 2024-09-07 DIAGNOSIS — M48062 Spinal stenosis, lumbar region with neurogenic claudication: Secondary | ICD-10-CM | POA: Insufficient documentation

## 2024-09-10 ENCOUNTER — Emergency Department (HOSPITAL_COMMUNITY)
Admission: EM | Admit: 2024-09-10 | Discharge: 2024-09-10 | Disposition: A | Discharge status: Home / Self Care | Payer: MEDICAID | Attending: Emergency Medicine | Admitting: Emergency Medicine

## 2024-09-10 ENCOUNTER — Other Ambulatory Visit: Payer: Self-pay

## 2024-09-10 ENCOUNTER — Emergency Department (HOSPITAL_COMMUNITY): Payer: MEDICAID

## 2024-09-10 DIAGNOSIS — K5900 Constipation, unspecified: Secondary | ICD-10-CM | POA: Diagnosis not present

## 2024-09-10 DIAGNOSIS — R5383 Other fatigue: Secondary | ICD-10-CM | POA: Diagnosis present

## 2024-09-10 LAB — URINALYSIS, ROUTINE W REFLEX MICROSCOPIC
Bacteria, UA: NONE SEEN
Bilirubin Urine: NEGATIVE
Glucose, UA: NEGATIVE mg/dL
Hgb urine dipstick: NEGATIVE
Ketones, ur: NEGATIVE mg/dL
Leukocytes,Ua: NEGATIVE
Nitrite: NEGATIVE
Protein, ur: 30 mg/dL — AB
Specific Gravity, Urine: 1.016 (ref 1.005–1.030)
pH: 7 (ref 5.0–8.0)

## 2024-09-10 LAB — CBG MONITORING, ED: Glucose-Capillary: 149 mg/dL — ABNORMAL HIGH (ref 70–99)

## 2024-09-10 MED ORDER — DICYCLOMINE HCL 20 MG PO TABS: 20.0000 mg | ORAL_TABLET | Freq: Two times a day (BID) | ORAL | 0 refills | Status: DC

## 2024-09-10 MED ORDER — DICYCLOMINE HCL 10 MG PO CAPS
10.0000 mg | ORAL_CAPSULE | Freq: Once | ORAL | Status: AC
  Administered 2024-09-10: 10 mg via ORAL
  Filled 2024-09-10: qty 1

## 2024-09-10 NOTE — Discharge Instructions (Addendum)
 Your testing in the emergency department is reassuring. There is only mild constipation on your imaging, no sign of obstruction or worse condition. Recommend Miralax , which is a medication found over-the-counter, 3 times daily for the next 3 days. This should help with clearing your bowels and, consequently, improving your discomfort. As we discussed, treating your abdominal discomfort with pain medications will worsen constipation and make your pain worse. Recommend Bentyl  as prescribed. See your doctor this week for recheck.
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
## 2024-09-10 NOTE — ED Provider Notes (Cosign Needed Addendum)
 [facility] DEPARTMENT AT [facility] Provider Note   CSN: [id number] Arrival date & time: 09/10/24  2012     Patient presents with: Fatigue and Constipation   Heidi [name] is a 53 y.o. female.   Patient to ED with complaint of constipation for the past 4-5 days despite use of mag citrate and suppositories. Today she started having difficulty emptying her bladder and having lower abdominal pain When she urinates she can only produce a few drops of urine. No fever, nausea, vomiting. She reports she is no longer passing any flatus. No previous abdominal surgeries.   The history is provided by the patient. No language interpreter was used.  Constipation      Prior to Admission medications   Medication Sig Start Date End Date Taking? Authorizing Provider  dicyclomine  (BENTYL ) 20 MG tablet Take 1 tablet (20 mg total) by mouth 2 (two) times daily. 09/10/24  Yes Odell Balls, PA-C  Accu-Chek Softclix Lancets lancets USE TO TEST BLOOD SUGAR THREE TIMES DAILY 09/04/24   Zarwolo, Gloria, FNP  albuterol  (PROVENTIL ) (2.5 MG/3ML) 0.083% nebulizer solution Take 3 mLs (2.5 mg total) by nebulization every 6 (six) hours as needed for wheezing or shortness of breath. 04/13/24   [name], FNP  Blood Glucose Monitoring Suppl DEVI 1 each by Does not apply route in the morning, at noon, and at bedtime. May substitute to any manufacturer covered by patient's insurance. Patient taking differently: 1 each by Does not apply route in the morning, at noon, and at bedtime. May substitute to any manufacturer covered by patient's insurance. 03/22/24   Suellen Cantor A, PA-C  Blood Pressure Monitor DEVI 1 each by Does not apply route daily. 02/11/24   Miriam Norris, NP  busPIRone  (BUSPAR ) 30 MG tablet Take 1 tablet (30 mg total) by mouth in the morning and at bedtime. 06/16/24   Del Orbe Polanco, Sola, FNP  cloNIDine  (CATAPRES  - DOSED IN MG/24 HR) 0.2 mg/24hr patch APPLY 1 PATCH  TO THE SKIN ONCE WEEKLY. Patient not taking: Reported on 09/01/2024 08/04/24   Del Orbe Polanco, Iliana, FNP  cyclobenzaprine  (FLEXERIL ) 10 MG tablet Take 1 tablet (10 mg total) by mouth 2 (two) times daily as needed for muscle spasms. 04/21/24   Margrette Taft BRAVO, MD  diltiazem  (CARDIZEM  CD) 120 MG 24 hr capsule Take 120 mg by mouth daily. 08/14/24   [provider]  folic acid  (FOLVITE ) 1 MG tablet Take 1 tablet (1 mg total) by mouth daily. 07/25/23   Ricky Fines, MD  furosemide  (LASIX ) 20 MG tablet Take 1 tablet (20 mg total) by mouth daily. 04/04/24 09/01/24  Miriam Norris, NP  gabapentin  (NEURONTIN ) 400 MG capsule TAKE ONE CAPSULE BY MOUTH THREE TIMES DAILY 08/24/24   Margrette Taft BRAVO, MD  glucose blood (ACCU-CHEK GUIDE TEST) test strip Use as instructed three times daily dx e11.65 05/30/24   Del Orbe Polanco, Iliana, FNP  HYDROcodone -acetaminophen  (NORCO/VICODIN) 5-325 MG tablet Take 1 tablet by mouth every 4 (four) hours as needed. 05/23/24   [provider]  hydrOXYzine (ATARAX) 25 MG tablet Take 25 mg by mouth at bedtime.    [provider]  ibuprofen  (ADVIL ) 800 MG tablet Take 800 mg by mouth 3 (three) times daily as needed.    [provider]  levocetirizine (XYZAL ) 5 MG tablet Take 1 tablet (5 mg total) by mouth every evening. 04/13/24   Del Orbe Polanco, Iliana, FNP  lidocaine  (LIDODERM ) 5 % Place 1 patch  onto the skin daily. Remove & Discard patch within 12 hours or as directed by MD 06/13/24   [name], FNP  lidocaine  (XYLOCAINE ) 2 % solution Use as directed 15 mLs in the mouth or throat as needed. 05/24/24   Del Orbe Polanco, Iliana, FNP  metFORMIN  (GLUCOPHAGE ) 500 MG tablet TAKE ONE TABLET BY MOUTH TWICE DAILY WITH A MEAL 09/04/24   Zarwolo, Gloria, FNP  metoprolol  tartrate (LOPRESSOR ) 25 MG tablet Take 0.5 tablets (12.5 mg total) by mouth 2 (two) times daily. 04/13/24   [name], FNP  Misc. Devices (GOJJI WEIGHT SCALE) MISC  Daily Weight 02/16/24   Del Wilhelmena Lloyd, Berea, FNP  naltrexone  (DEPADE) 50 MG tablet Take 1 tablet (50 mg total) by mouth daily. 01/24/24   Del Orbe Polanco, Iliana, FNP  naproxen  (NAPROSYN ) 500 MG tablet TAKE (1) TABLET BY MOUTH TWICE DAILY WITH MEALS. 08/10/24   Brenna Lin, MD  olmesartan  (BENICAR ) 20 MG tablet Take 1 tablet (20 mg total) by mouth daily. 04/04/24   Miriam Norris, NP  ondansetron  (ZOFRAN ) 4 MG tablet Take 1 tablet (4 mg total) by mouth every 8 (eight) hours as needed for nausea or vomiting. 04/13/24   Del Orbe Polanco, Iliana, FNP  pantoprazole  (PROTONIX ) 40 MG tablet Take 1 tablet (40 mg total) by mouth 2 (two) times daily. 12/07/23 12/06/24  Carver, Charles K, DO  polyethylene glycol powder (GLYCOLAX /MIRALAX ) 17 GM/SCOOP powder Take 17 g by mouth daily. Mix with 8 oz of water . 02/28/24   Rudy Josette RAMAN, PA-C  potassium chloride  (KLOR-CON ) 10 MEQ tablet Take 10 mEq by mouth daily. 04/14/24   [provider]  potassium chloride  SA (KLOR-CON  M) 20 MEQ tablet Take 2 tablets (40 mEq total) by mouth daily for 4 days. 06/19/24 09/01/24  Rudy Josette RAMAN, PA-C  predniSONE  (DELTASONE ) 10 MG tablet Take 1 tablet (10 mg total) by mouth 3 (three) times daily. Patient not taking: Reported on 09/01/2024 03/13/24   Margrette Taft BRAVO, MD  pregabalin  (LYRICA ) 50 MG capsule Take 1 capsule (50 mg total) by mouth 2 (two) times daily as needed. 06/16/24   [name], FNP  rosuvastatin  (CRESTOR ) 40 MG tablet Take 1 tablet (40 mg total) by mouth daily. 04/04/24   Miriam Norris, NP  sertraline  (ZOLOFT ) 25 MG tablet Take 1 tablet (25 mg total) by mouth daily. 06/13/24   [name], FNP  UNABLE TO FIND Power wheelchair x 1  DX F51.937 08/17/24   Del Wilhelmena Lloyd, Sola, FNP  varenicline  (CHANTIX ) 0.5 MG tablet TAKE ONE TABLET BY MOUTH TWICE DAILY 08/07/24   Del Wilhelmena Lloyd, Sola, FNP  VENTOLIN  HFA 108 (90 Base) MCG/ACT inhaler INHALE TWO PUFFS INTO THE LUNGS EVERY  6 HOURS AS NEEDED 08/29/24   Del Wilhelmena Lloyd, Iliana, FNP  Vitamin D , Ergocalciferol , (DRISDOL) 1.25 MG (50000 UNIT) CAPS capsule Take 50,000 Units by mouth once a week. 07/27/24   [provider]    Allergies: Patient has no known allergies.    Review of Systems  Gastrointestinal:  Positive for constipation.    Updated Vital Signs BP 110/84   Pulse 92   Temp 97.7 F (36.5 C)   Ht 5' 3 (1.6 m)   Wt 89.8 kg   SpO2 92%   BMI 35.07 kg/m   Physical Exam Vitals and nursing note reviewed.  Constitutional:      Appearance: Normal appearance. She is obese.  Cardiovascular:     Rate and  Rhythm: Tachycardia present.  Pulmonary:     Effort: Pulmonary effort is normal.  Abdominal:     General: There is no distension.     Palpations: Abdomen is soft.     Tenderness: There is abdominal tenderness (Suprapubic tenderness, soft abdomen.).  Genitourinary:    Rectum: Normal.     Comments: No fecal impaction to finger depth.  Musculoskeletal:        General: Normal range of motion.  Skin:    General: Skin is warm and dry.  Neurological:     Mental Status: She is alert and oriented to person, place, and time.     (all labs ordered are listed, but only abnormal results are displayed) Labs Reviewed  URINALYSIS, ROUTINE W REFLEX MICROSCOPIC - Abnormal; Notable for the following components:      Result Value   APPearance HAZY (*)    Protein, ur 30 (*)    All other components within normal limits  CBG MONITORING, ED - Abnormal; Notable for the following components:   Glucose-Capillary 149 (*)    All other components within normal limits    EKG: None  Radiology: DG Abdomen 1 View Result Date: 09/10/2024 CLINICAL DATA:  Constipation EXAM: ABDOMEN - 1 VIEW COMPARISON:  Abdominal x-ray 07/10/2023. FINDINGS: The bowel gas pattern is normal. Stool burden is average. No radio-opaque calculi or other significant radiographic abnormality are seen. IMPRESSION: Negative.  Electronically Signed   By: Greig Pique M.D.   On: 09/10/2024 23:11     Procedures   Medications Ordered in the ED  dicyclomine  (BENTYL ) capsule 10 mg (has no administration in time range)    Clinical Course as of 09/10/24 2325  Sun Sep 10, 2024  2252 Patient with concern for 4 days of not moving her bowels. Abdominal exam is essentially benign with mild suprapubic tenderness. UA negative for infection. No fever. No vomiting. No fecal impaction. KUB pending.   [SU]  2319 KUB shows only an average stool burden, no obstructive findings. Abdomen soft. UA negative. Will recommend continue gentle laxative use at home, close follow up with PCP.   She is requesting something for pain. Discussed that pain medications will worsen constipation. Strongly encouraged seeing her doctor for further management.  [SU]    Clinical Course User Index [SU] Odell Balls, PA-C                                 Medical Decision Making Amount and/or Complexity of Data Reviewed Labs: ordered. Radiology: ordered.        Final diagnoses:  Constipation, unspecified constipation type    ED Discharge Orders          Ordered    dicyclomine  (BENTYL ) 20 MG tablet  2 times daily        09/10/24 2325               Odell Balls, PA-C 09/10/24 2256    Odell Balls, PA-C 09/10/24 2325    Cleotilde Rogue, MD 09/12/24 [phone]

## 2024-09-12 ENCOUNTER — Encounter (HOSPITAL_COMMUNITY): Payer: Self-pay

## 2024-09-12 ENCOUNTER — Encounter (HOSPITAL_COMMUNITY): Payer: MEDICAID

## 2024-09-13 ENCOUNTER — Other Ambulatory Visit: Payer: Self-pay | Admitting: Family Medicine

## 2024-09-14 ENCOUNTER — Encounter (HOSPITAL_COMMUNITY): Payer: Self-pay | Admitting: Occupational Therapy

## 2024-09-14 ENCOUNTER — Ambulatory Visit (HOSPITAL_COMMUNITY): Payer: MEDICAID | Admitting: Occupational Therapy

## 2024-09-14 DIAGNOSIS — M6281 Muscle weakness (generalized): Secondary | ICD-10-CM

## 2024-09-14 DIAGNOSIS — M545 Low back pain, unspecified: Secondary | ICD-10-CM | POA: Diagnosis not present

## 2024-09-14 DIAGNOSIS — R29898 Other symptoms and signs involving the musculoskeletal system: Secondary | ICD-10-CM
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
## 2024-09-19 ENCOUNTER — Telehealth: Payer: Self-pay | Admitting: Family Medicine

## 2024-09-19 NOTE — Telephone Encounter (Signed)
 Patient will need a New Referral placed as the completed Appt is attached to previous Referral.  PLEASE place the requested location in the Referral comments so it gets sent to the appropriate office that the Patient is requesting.

## 2024-09-19 NOTE — Telephone Encounter (Signed)
 Confused as to what Message is requesting.  Patient has already been seen with PT at [facility] in [place].

## 2024-09-19 NOTE — Telephone Encounter (Signed)
 Copied from CRM #[id number]. Topic: Referral - Request for Referral >> Sep 19, 2024 12:42 PM Corin V wrote: Did the patient discuss referral with their provider in the last year? Yes (If No - schedule appointment) (If Yes - send message)  Appointment offered? No- just needs new clinic due to insurance coverage  Type of order/referral and detailed reason for visit: Physical therapy for knees, back  Preference of office, provider, location: [email or web address] F: [phone]  If referral order, have you been seen by this specialty before? Yes- [place] (If Yes, this issue or another issue? When? Where?  Can we respond through MyChart? Yes >> Sep 19, 2024 12:47 PM Corin V wrote: Patient had to cancel current appointments due to coverage and needs the new referral as soon as possible due to health history.

## 2024-09-20 ENCOUNTER — Other Ambulatory Visit: Payer: Self-pay

## 2024-09-20 ENCOUNTER — Ambulatory Visit (HOSPITAL_COMMUNITY): Payer: MEDICAID | Attending: Family Medicine

## 2024-09-20 DIAGNOSIS — M79605 Pain in left leg: Secondary | ICD-10-CM | POA: Insufficient documentation

## 2024-09-20 DIAGNOSIS — M6281 Muscle weakness (generalized): Secondary | ICD-10-CM | POA: Insufficient documentation

## 2024-09-20 DIAGNOSIS — M79604 Pain in right leg: Secondary | ICD-10-CM | POA: Diagnosis present

## 2024-09-20 DIAGNOSIS — M545 Low back pain, unspecified: Secondary | ICD-10-CM | POA: Insufficient documentation

## 2024-09-20 DIAGNOSIS — R29898 Other symptoms and signs involving the musculoskeletal system: Secondary | ICD-10-CM | POA: Insufficient documentation

## 2024-09-20 DIAGNOSIS — M48062 Spinal stenosis, lumbar region with neurogenic claudication: Secondary | ICD-10-CM
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
## 2024-09-20 NOTE — Telephone Encounter (Signed)
 As stated in previous messages - A new Referral will need to be placed as the previous one already has a completed appt attached :)

## 2024-09-22 ENCOUNTER — Encounter (HOSPITAL_COMMUNITY): Payer: MEDICAID

## 2024-09-25 ENCOUNTER — Ambulatory Visit (HOSPITAL_COMMUNITY): Payer: MEDICAID | Admitting: Physical Therapy

## 2024-09-25 ENCOUNTER — Other Ambulatory Visit: Payer: Self-pay | Admitting: Family Medicine

## 2024-09-25 DIAGNOSIS — M6281 Muscle weakness (generalized): Secondary | ICD-10-CM | POA: Diagnosis not present

## 2024-09-25 DIAGNOSIS — M79604 Pain in right leg: Secondary | ICD-10-CM

## 2024-09-25 DIAGNOSIS — M545 Low back pain, unspecified: Secondary | ICD-10-CM

## 2024-09-25 MED ORDER — NALTREXONE HCL 50 MG PO TABS: 50.0000 mg | ORAL_TABLET | Freq: Every day | ORAL | 2 refills | Status: DC
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
## 2024-09-25 NOTE — Telephone Encounter (Signed)
 Copied from CRM [id number]. Topic: Clinical - Medication Refill >> Sep 25, 2024  8:42 AM Roselie BROCKS wrote: Medication: naltrexone  (DEPADE) 50 MG tablet  Has the patient contacted their pharmacy? Yes (Agent: If no, request that the patient contact the pharmacy for the refill. If patient does not wish to contact the pharmacy document the reason why and proceed with request.) (Agent: If yes, when and what did the pharmacy advise?)  This is the patient's preferred pharmacy:  [facility] - [place], [place] - [address] [place] [address] Phone: [phone] Fax: [phone]  Is this the correct pharmacy for this prescription? Yes If no, delete pharmacy and type the correct one.   Has the prescription been filled recently? No  Is the patient out of the medication? Yes  Has the patient been seen for an appointment in the last year OR does the patient have an upcoming appointment? Yes  Can we respond through MyChart? No  Agent: Please be advised that Rx refills may take up to 3 business days. We ask that you follow-up with your pharmacy.

## 2024-09-27 ENCOUNTER — Encounter (HOSPITAL_COMMUNITY): Payer: MEDICAID

## 2024-09-28 ENCOUNTER — Ambulatory Visit: Payer: MEDICAID | Admitting: Family Medicine

## 2024-09-28 ENCOUNTER — Encounter: Payer: Self-pay | Admitting: Family Medicine

## 2024-09-28 VITALS — BP 119/81 | HR 104 | Resp 18 | Ht 63.0 in | Wt 193.1 lb

## 2024-09-28 DIAGNOSIS — F339 Major depressive disorder, recurrent, unspecified: Secondary | ICD-10-CM

## 2024-09-28 DIAGNOSIS — F419 Anxiety disorder, unspecified: Secondary | ICD-10-CM

## 2024-09-28 DIAGNOSIS — F1090 Alcohol use, unspecified, uncomplicated: Secondary | ICD-10-CM

## 2024-09-28 MED ORDER — HYDROXYZINE PAMOATE 50 MG PO CAPS: 50.0000 mg | ORAL_CAPSULE | Freq: Three times a day (TID) | ORAL | 2 refills | Status: DC | PRN

## 2024-09-28 NOTE — Assessment & Plan Note (Signed)
 Denies SI/HI and AVH Refills sent to the pharmacy

## 2024-09-28 NOTE — Progress Notes (Signed)
 Established Patient Office Visit  Subjective:  Patient ID: Heidi [name], female    DOB: 08-Jul-1971  Age: 53 y.o. MRN: [id number]  CC:  Chief Complaint  Patient presents with   Medical Management of Chronic Issues    Follow up. Wanting to go back on medication for alcoholism. States she has started drinking again to cope with anxiety    Anxiety    Pt states her anxiety has increased over the last couple weeks and is not sleeping. Pt is requesting increase in anxiety medication     HPI Heidi [name] is a 53 y.o. female with past medical history of  essential hypertension, type 2 diabetes, hyperlipidemia presents for f/u of  chronic medical conditions.  For the details of today's visit, please refer to the assessment and plan.     Past Medical History:  Diagnosis Date   Alcohol withdrawal (HCC)    Anxiety    Cervical radiculopathy    Depression    Diabetes mellitus without complication (HCC)    GERD (gastroesophageal reflux disease)    Hypertension     Past Surgical History:  Procedure Laterality Date   BIOPSY  11/30/2023   Procedure: BIOPSY;  Surgeon: Cindie Carlin POUR, DO;  Location: [facility];  Service: Endoscopy;;   BRAIN SURGERY     COLONOSCOPY WITH PROPOFOL  N/A 11/30/2023   Surgeon: Cindie Carlin K, DO;   5 mm polyp resected and retrieved, stool throughout the entire colon.  Pathology with tubular adenoma.  Recommended 5-year surveillance.   ESOPHAGOGASTRODUODENOSCOPY (EGD) WITH PROPOFOL  N/A 11/30/2023   Surgeon: Cindie Carlin POUR, DO;  Small hiatal hernia, gastritis biopsied, duodenitis.  Pathology with reactive gastropathy and focal erosion, negative for H. pylori. Recommended PPI BID.   FLEXIBLE SIGMOIDOSCOPY N/A 07/13/2023   Procedure: FLEXIBLE SIGMOIDOSCOPY;  Surgeon: Eartha Angelia Sieving, MD;  Location: [facility];  Service: Gastroenterology;  Laterality: N/A;   POLYPECTOMY  11/30/2023   Procedure: POLYPECTOMY;  Surgeon: Cindie Carlin POUR, DO;   Location: [facility];  Service: Endoscopy;;    Family History  Problem Relation Age of Onset   Stroke Father     Social History   Socioeconomic History   Marital status: Widowed    Spouse name: Not on file   Number of children: Not on file   Years of education: Not on file   Highest education level: Not on file  Occupational History   Not on file  Tobacco Use   Smoking status: Former    Current packs/day: 0.00    Average packs/day: 2.0 packs/day for 32.2 years (64.5 ttl pk-yrs)    Types: Cigarettes    Start date: 40    Quit date: 03/2024    Years since quitting: 0.5   Smokeless tobacco: Never  Vaping Use   Vaping status: Never Used  Substance and Sexual Activity   Alcohol use: Not Currently    Alcohol/week: 42.0 standard drinks of alcohol    Types: 42 Cans of beer per week    Comment: 6 pack of beer daily. No alcohol since 05/05/23.   Drug use: No   Sexual activity: Yes  Other Topics Concern   Not on file  Social History Narrative   Not on file   Social Drivers of Health   Financial Resource Strain: Not on file  Food Insecurity: No Food Insecurity (07/05/2023)   Hunger Vital Sign    Worried About Running Out of Food in the Last Year: Never true  Ran Out of Food in the Last Year: Never true  Transportation Needs: No Transportation Needs (07/05/2023)   PRAPARE - Administrator, Civil Service (Medical): No    Lack of Transportation (Non-Medical): No  Physical Activity: Not on file  Stress: Not on file  Social Connections: Not on file  Intimate Partner Violence: Not At Risk (07/05/2023)   Humiliation, Afraid, Rape, and Kick questionnaire    Fear of Current or Ex-Partner: No    Emotionally Abused: No    Physically Abused: No    Sexually Abused: No    Outpatient Medications Prior to Visit  Medication Sig Dispense Refill   Accu-Chek Softclix Lancets lancets USE TO TEST BLOOD SUGAR THREE TIMES DAILY 100 each 3   albuterol  (PROVENTIL ) (2.5  MG/3ML) 0.083% nebulizer solution Take 3 mLs (2.5 mg total) by nebulization every 6 (six) hours as needed for wheezing or shortness of breath. 150 mL 1   Blood Glucose Monitoring Suppl DEVI 1 each by Does not apply route in the morning, at noon, and at bedtime. May substitute to any manufacturer covered by patient's insurance. 1 each 0   Blood Pressure Monitor DEVI 1 each by Does not apply route daily. 1 each 0   busPIRone  (BUSPAR ) 30 MG tablet Take 1 tablet (30 mg total) by mouth in the morning and at bedtime. 60 tablet 3   cyclobenzaprine  (FLEXERIL ) 10 MG tablet Take 1 tablet (10 mg total) by mouth 2 (two) times daily as needed for muscle spasms. 40 tablet 1   dicyclomine  (BENTYL ) 20 MG tablet Take 1 tablet (20 mg total) by mouth 2 (two) times daily. 20 tablet 0   diltiazem  (CARDIZEM  CD) 120 MG 24 hr capsule Take 120 mg by mouth daily.     folic acid  (FOLVITE ) 1 MG tablet Take 1 tablet (1 mg total) by mouth daily. 30 tablet 3   furosemide  (LASIX ) 20 MG tablet Take 1 tablet (20 mg total) by mouth daily. 90 tablet 1   gabapentin  (NEURONTIN ) 400 MG capsule TAKE ONE CAPSULE BY MOUTH THREE TIMES DAILY 60 capsule 5   glucose blood (ACCU-CHEK GUIDE TEST) test strip Use as instructed three times daily dx e11.65 100 each 3   HYDROcodone -acetaminophen  (NORCO/VICODIN) 5-325 MG tablet Take 1 tablet by mouth every 4 (four) hours as needed.     hydrOXYzine (ATARAX) 25 MG tablet Take 25 mg by mouth at bedtime.     ibuprofen  (ADVIL ) 800 MG tablet Take 800 mg by mouth 3 (three) times daily as needed.     levocetirizine (XYZAL ) 5 MG tablet Take 1 tablet (5 mg total) by mouth every evening. 30 tablet 5   lidocaine  (LIDODERM ) 5 % Place 1 patch onto the skin daily. Remove & Discard patch within 12 hours or as directed by MD 30 patch 2   lidocaine  (XYLOCAINE ) 2 % solution Use as directed 15 mLs in the mouth or throat as needed. 100 mL 0   metFORMIN  (GLUCOPHAGE ) 500 MG tablet TAKE ONE TABLET BY MOUTH TWICE DAILY WITH A  MEAL 90 tablet 3   metoprolol  tartrate (LOPRESSOR ) 25 MG tablet Take 0.5 tablets (12.5 mg total) by mouth 2 (two) times daily. 60 tablet 5   Misc. Devices (GOJJI WEIGHT SCALE) MISC Daily Weight 1 each 0   naltrexone  (DEPADE) 50 MG tablet Take 1 tablet (50 mg total) by mouth daily. 30 tablet 2   naproxen  (NAPROSYN ) 500 MG tablet TAKE (1) TABLET BY MOUTH TWICE DAILY WITH MEALS. 60 tablet  2   olmesartan  (BENICAR ) 20 MG tablet Take 1 tablet (20 mg total) by mouth daily. 30 tablet 6   ondansetron  (ZOFRAN ) 4 MG tablet Take 1 tablet (4 mg total) by mouth every 8 (eight) hours as needed for nausea or vomiting. 20 tablet 3   pantoprazole  (PROTONIX ) 40 MG tablet Take 1 tablet (40 mg total) by mouth 2 (two) times daily. 60 tablet 11   polyethylene glycol powder (GLYCOLAX /MIRALAX ) 17 GM/SCOOP powder Take 17 g by mouth daily. Mix with 8 oz of water . 510 g 3   potassium chloride  SA (KLOR-CON  M) 20 MEQ tablet Take 2 tablets (40 mEq total) by mouth daily for 4 days. 8 tablet 0   pregabalin  (LYRICA ) 50 MG capsule Take 1 capsule (50 mg total) by mouth 2 (two) times daily as needed. 60 capsule 0   rosuvastatin  (CRESTOR ) 40 MG tablet Take 1 tablet (40 mg total) by mouth daily. 90 tablet 1   sertraline  (ZOLOFT ) 25 MG tablet Take 1 tablet (25 mg total) by mouth daily. 30 tablet 3   UNABLE TO FIND Power wheelchair x 1  DX M48.062 1 each 0   varenicline  (CHANTIX ) 0.5 MG tablet TAKE ONE TABLET BY MOUTH TWICE DAILY 56 tablet 0   VENTOLIN  HFA 108 (90 Base) MCG/ACT inhaler INHALE TWO PUFFS INTO THE LUNGS EVERY 6 HOURS AS NEEDED 18 g 2   Vitamin D , Ergocalciferol , (DRISDOL) 1.25 MG (50000 UNIT) CAPS capsule Take 50,000 Units by mouth once a week.     cloNIDine  (CATAPRES  - DOSED IN MG/24 HR) 0.2 mg/24hr patch APPLY 1 PATCH TO THE SKIN ONCE WEEKLY. (Patient not taking: Reported on 09/28/2024) 4 patch 4   potassium chloride  (KLOR-CON ) 10 MEQ tablet Take 10 mEq by mouth daily. (Patient not taking: Reported on 09/28/2024)      predniSONE  (DELTASONE ) 10 MG tablet Take 1 tablet (10 mg total) by mouth 3 (three) times daily. (Patient not taking: Reported on 09/28/2024) 42 tablet 0   No facility-administered medications prior to visit.    No Known Allergies  ROS Review of Systems  Constitutional:  Negative for chills and fever.  Eyes:  Negative for visual disturbance.  Respiratory:  Negative for chest tightness and shortness of breath.   Neurological:  Negative for dizziness and headaches.      Objective:    Physical Exam HENT:     Head: Normocephalic.     Mouth/Throat:     Mouth: Mucous membranes are moist.  Cardiovascular:     Rate and Rhythm: Normal rate.     Heart sounds: Normal heart sounds.  Pulmonary:     Effort: Pulmonary effort is normal.     Breath sounds: Normal breath sounds.  Neurological:     Mental Status: She is alert.     BP 119/81   Pulse (!) 104   Resp 18   Ht 5' 3 (1.6 m)   Wt 193 lb 1.9 oz (87.6 kg)   SpO2 95%   BMI 34.21 kg/m  Wt Readings from Last 3 Encounters:  09/28/24 193 lb 1.9 oz (87.6 kg)  09/10/24 198 lb (89.8 kg)  09/01/24 198 lb (89.8 kg)    Lab Results  Component Value Date   TSH 0.974 02/16/2024   Lab Results  Component Value Date   WBC 13.7 (H) 06/16/2024   HGB 12.3 06/16/2024   HCT 38.1 06/16/2024   MCV 87 06/16/2024   PLT 357 06/16/2024   Lab Results  Component Value Date  NA 143 09/01/2024   K 3.6 09/01/2024   CO2 22 09/01/2024   GLUCOSE 110 (H) 09/01/2024   BUN 13 09/01/2024   CREATININE 0.80 09/01/2024   BILITOT 0.3 06/16/2024   ALKPHOS 113 06/16/2024   AST 16 06/16/2024   ALT 10 06/16/2024   PROT 6.8 06/16/2024   ALBUMIN 4.6 06/16/2024   CALCIUM  11.2 (H) 09/01/2024   ANIONGAP 13 03/22/2024   EGFR 89 09/01/2024   Lab Results  Component Value Date   CHOL 147 06/16/2024   Lab Results  Component Value Date   HDL 62 06/16/2024   Lab Results  Component Value Date   LDLCALC 64 06/16/2024   Lab Results  Component Value  Date   TRIG 116 06/16/2024   Lab Results  Component Value Date   CHOLHDL 2.4 06/16/2024   Lab Results  Component Value Date   HGBA1C 7.0 (H) 09/01/2024      Assessment & Plan:  Anxiety and depression Assessment & Plan: Denies SI/HI and AVH -informed the patient that her hydroxyzine dose has been increased to 50 mg every 8 hours as needed. This should help with both anxiety symptoms and sleep. -Encouraged to continue taking Buspar  (buspirone ) 30 mg twice daily (BID) as prescribed.  Orders: -     hydrOXYzine Pamoate; Take 1 capsule (50 mg total) by mouth 3 (three) times daily as needed.  Dispense: 30 capsule; Refill: 2  Alcohol use disorder Assessment & Plan: Please inform the patient that a refill of naltrexone  cannot be provided while she is currently taking Norco, as these medications should not be used together. Alcohol cessation is strongly encouraged.    Depression, recurrent Assessment & Plan: Denies SI/HI and AVH Refills sent to the pharmacy    Note: This chart has been completed using Engineer, civil (consulting) software, and while attempts have been made to ensure accuracy, certain words and phrases may not be transcribed as intended.    Follow-up: Return in about 4 months (around 01/29/2025).   Jayro Mcmath  Z Bacchus, FNP

## 2024-09-28 NOTE — Assessment & Plan Note (Addendum)
 Denies SI/HI and AVH -informed the patient that her hydroxyzine dose has been increased to 50 mg every 8 hours as needed. This should help with both anxiety symptoms and sleep. -Encouraged to continue taking Buspar  (buspirone ) 30 mg twice daily (BID) as prescribed.

## 2024-09-28 NOTE — Patient Instructions (Addendum)
 I appreciate the opportunity to provide care to you today!    Follow up:  4 months  Labs: next visit   Anxiety Management Plan -Your hydroxyzine dose has been increased to 50 mg every 8 hours as needed. This should help with both anxiety symptoms and sleep. -Please continue taking Buspar  (buspirone ) 30 mg twice daily (BID) as prescribed.  Please follow up if your symptoms worsen or fail to improve.    Please continue to a heart-healthy diet and increase your physical activities. Try to exercise for at least five days a week.    It was a pleasure to see you and I look forward to continuing to work together on your health and well-being. Please do not hesitate to call the office if you need care or have questions about your care.  In case of emergency, please visit the Emergency Department for urgent care, or contact our clinic at [phone] to schedule an appointment. We're here to help you!   Have a wonderful day and week. With Gratitude, Meade JENEANE Gerlach MSN, FNP-BC, PMHNP-BC

## 2024-09-28 NOTE — Assessment & Plan Note (Signed)
 Please inform the patient that a refill of naltrexone  cannot be provided while she is currently taking Norco, as these medications should not be used together. Alcohol cessation is strongly encouraged.

## 2024-10-03 ENCOUNTER — Telehealth: Payer: Self-pay | Admitting: Family Medicine

## 2024-10-03 NOTE — Telephone Encounter (Signed)
 Message fwd to referral coordinator

## 2024-10-03 NOTE — Telephone Encounter (Signed)
 Referral has been sent to [facility] as Patient requested at this time.

## 2024-10-03 NOTE — Telephone Encounter (Signed)
 Copied from CRM [id number]. Topic: Referral - Request for Referral >> Oct 03, 2024  9:32 AM Lonell PEDLAR wrote: Did the patient discuss referral with their provider in the last year? Yes   Appointment offered? No- just needs new clinic due to insurance coverage   Type of order/referral and detailed reason for visit: Physical therapy for knees, back   Preference of office, provider, location: [email or web address]  F: [phone]   If referral order, have you been seen by this specialty before? Yes- [place]    Can we respond through MyChart? Yes

## 2024-10-04 ENCOUNTER — Encounter (INDEPENDENT_AMBULATORY_CARE_PROVIDER_SITE_OTHER): Payer: Self-pay | Admitting: Gastroenterology

## 2024-10-04 ENCOUNTER — Ambulatory Visit (HOSPITAL_COMMUNITY): Payer: MEDICAID | Admitting: Physical Therapy

## 2024-10-04 DIAGNOSIS — M79605 Pain in left leg: Secondary | ICD-10-CM

## 2024-10-04 DIAGNOSIS — M6281 Muscle weakness (generalized): Secondary | ICD-10-CM | POA: Diagnosis not present

## 2024-10-04 DIAGNOSIS — M545 Low back pain, unspecified: Secondary | ICD-10-CM

## 2024-10-04 DIAGNOSIS — R29898 Other symptoms and signs involving the musculoskeletal system: Secondary | ICD-10-CM

## 2024-10-04 NOTE — Therapy (Signed)
 PHYSICAL THERAPY DISCHARGE SUMMARY  Visits from Start of Care: 4  Current functional level related to goals / functional outcomes: See last visit on 10/04/24   Remaining deficits: See last visit on 10/04/24   Education / Equipment: HEP   Patient agrees to discharge. Patient goals were met. Patient is being discharged due to meeting the stated rehab goals.  1:37 PM, 10/04/24 Rosaria Settler, PT, DPT [facility] [place]
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
## 2024-10-05 ENCOUNTER — Encounter: Payer: Self-pay | Admitting: Nurse Practitioner

## 2024-10-05 ENCOUNTER — Ambulatory Visit: Payer: MEDICAID | Attending: Nurse Practitioner | Admitting: Nurse Practitioner

## 2024-10-05 VITALS — BP 108/68 | HR 114 | Ht 63.0 in | Wt 191.0 lb

## 2024-10-05 DIAGNOSIS — J449 Chronic obstructive pulmonary disease, unspecified: Secondary | ICD-10-CM | POA: Diagnosis present

## 2024-10-05 DIAGNOSIS — R000 Tachycardia, unspecified: Secondary | ICD-10-CM

## 2024-10-05 DIAGNOSIS — I5032 Chronic diastolic (congestive) heart failure: Secondary | ICD-10-CM | POA: Diagnosis present

## 2024-10-05 MED ORDER — METOPROLOL TARTRATE 25 MG PO TABS: 25.0000 mg | ORAL_TABLET | Freq: Two times a day (BID) | ORAL | 5 refills | Status: DC

## 2024-10-05 MED ORDER — OLMESARTAN MEDOXOMIL 20 MG PO TABS: 10.0000 mg | ORAL_TABLET | Freq: Every day | ORAL | 6 refills | Status: AC

## 2024-10-05 NOTE — Patient Instructions (Addendum)
 Medication Instructions:  Your physician has recommended you make the following change in your medication:  Please Increase Metoprolol  Tartrate (lopressor ) to 25 Mg Twice daily  Decrease Olmesartan  to 10 Mg daily   Labwork: none  Testing/Procedures: None   Follow-Up: Your physician recommends that you schedule a follow-up appointment in:  1 week Nurse visit for an EKG  6 Months   Any Other Special Instructions Will Be Listed Below (If Applicable).  If you need a refill on your cardiac medications before your next appointment, please call your pharmacy.

## 2024-10-05 NOTE — Progress Notes (Signed)
 Cardiology Office Note:  .   Date:  10/05/2024 ID:  Heidi [name], DOB 01/19/1971, MRN [id number] PCP: Terry Wilhelmena Lloyd Hilario, FNP  [facility] Providers Cardiologist:  Alvan Carrier, MD    History of Present Illness: .   Heidi [name] is a 53 y.o. female with a PMH of HFpEF (dx 01/2024), chest pain, shortness of breath, palpitations, tachycardia, COPD, history of tobacco abuse, who presents today for scheduled follow-up.  Last seen by Dr. Carrier Alvan on January 06, 2024.  She noted palpitations occurring about 1-2 times per week, only lasting a few minutes.  Admitted to some shortness of breath and dizziness.  Her shortness of breath have been ongoing for about 6 months and noted with mild activities.  She noted improvement with her shortness of breath with by taking her inhaler.  PFTs were ordered, have not been done.  Monitor revealed sinus rhythm with average heart rate 99 bpm -see full report below.  ED visit on 02/08/2024 for chest pain, shortness of breath. EKG negative for anything acute. D-dimer slightly elevated. Troponins negative. CT angio revealed no PE, evidence of emphysema, and trace pericardial effusion. CXR negative for anything acute.   02/11/2024 - Today she presents for follow-up.  She presents with shortness of breath and chest pain. Admits to orthopnea, weight gain and swelling, particularly noted in her abdomen. Weight is up 15 lbs from earlier this month. She notices feeling fatigued. Admits to intermittent CP along left upper chest, described as dull. Noted to be worse with talking, denies any alleviating factors. Notices sensation of palpitations and tachycardia when laying down. Denies any syncope, presyncope, dizziness, PND, acute bleeding, or claudication. Our office has also received clearance regarding future dental procedure - see telephone note dated 02/03/2024.  04/04/2024 - She presents today for follow-up. Doing well and feeling better since I last  saw her. Tolerating her medications well. Denies any chest pain, shortness of breath, palpitations, syncope, presyncope, dizziness, orthopnea, PND, swelling or significant weight changes, acute bleeding, or claudication. Says she is pending oral surgery with Dr. Sheryle in [place]. She tells me she has an ingrown tooth along her gumline. Our office has not received clearance forms faxed to our office.   10/05/2024 - Doing well and currently denies any acute cardiac plaints or issues.  Tells me she does not feel her heart going fast.  Denies any palpitations. Denies any chest pain, shortness of breath, syncope, presyncope, dizziness, orthopnea, PND, swelling or significant weight changes, acute bleeding, or claudication.  Compliant with medications and tolerating well. She has lost about 7 lbs within the past month.   ROS: Negative. See HPI.   Studies Reviewed: SABRA    EKG:  EKG Interpretation Date/Time:  Thursday October 05 2024 11:13:51 EDT Ventricular Rate:  114 PR Interval:  150 QRS Duration:  82 QT Interval:  350 QTC Calculation: 482 R Axis:   91  Text Interpretation: Sinus tachycardia Rightward axis Low voltage QRS When compared with ECG of 11-Feb-2024 13:21, Nonspecific T wave abnormality no longer evident in Anterior leads Confirmed by Miriam Norris [phone]) on 10/05/2024 11:18:48 AM    Echo 02/2024:   1. Left ventricular ejection fraction, by estimation, is 60 to 65%. The  left ventricle has normal function. Left ventricular endocardial border  not optimally defined to evaluate regional wall motion. There is mild  concentric left ventricular  hypertrophy. Left ventricular diastolic parameters were normal.   2. Right ventricular systolic function is normal. The  right ventricular  size is normal. There is normal pulmonary artery systolic pressure. The  estimated right ventricular systolic pressure is 34.1 mmHg.   3. The mitral valve is grossly normal. Mild mitral valve  regurgitation.   4. The aortic valve is tricuspid. Aortic valve regurgitation is not  visualized. No aortic stenosis is present. Aortic valve mean gradient  measures 3.0 mmHg.   5. The inferior vena cava is normal in size with greater than 50%  respiratory variability, suggesting right atrial pressure of 3 mmHg.   Comparison(s): No prior Echocardiogram.  Cardiac monitor (preliminary report) 12/2023: Patch Wear Time:  6 days and 10 hours (2025-01-16T10:53:25-0500 to 2025-01-22T21:12:17-0500)   Patient had a min HR of 58 bpm, max HR of 161 bpm, and avg HR of 99 bpm. Predominant underlying rhythm was Sinus Rhythm. Isolated SVEs were rare (<1.0%), SVE Couplets were rare (<1.0%), and no SVE Triplets were present. Isolated VEs were rare (<1.0%),  and no VE Couplets or VE Triplets were present.  Risk Assessment/Calculations:    The 10-year ASCVD risk score (Arnett DK, et al., 2019) is: 2.9%   Values used to calculate the score:     Age: 10 years     Clincally relevant sex: Female     Is Non-Hispanic African American: Yes     Diabetic: Yes     Tobacco smoker: No     Systolic Blood Pressure: 108 mmHg     Is BP treated: Yes     HDL Cholesterol: 62 mg/dL     Total Cholesterol: 147 mg/dL  Physical Exam:   VS:  BP 108/68   Pulse (!) 114   Ht 5' 3 (1.6 m)   Wt 191 lb (86.6 kg)   SpO2 99%   BMI 33.83 kg/m    Wt Readings from Last 3 Encounters:  10/05/24 191 lb (86.6 kg)  09/28/24 193 lb 1.9 oz (87.6 kg)  09/10/24 198 lb (89.8 kg)    GEN: Obese, 53 y.o. female in no acute distress NECK: No JVD; No carotid bruits CARDIAC: S1/S2, RRR, no murmurs, rubs, gallops RESPIRATORY:  Clear to auscultation without rales, wheezing or rhonchi  EXTREMITIES: no edema to extremities; No deformity   ASSESSMENT AND PLAN: .    HFpEF Stage C, NYHA class I symptoms. Echo 02/2024 revealed normal LVEF, mild LVH, normal diastolic parameters. Previously noted 15 lbs weight gain within a little over 2 weeks  with signs and symptoms of volume overload. Diuresed well with Lasix  since last office visit. Weight has returned to her baseline. Continue current medication regimen. Will provide refills per her request. Low sodium diet, fluid restriction <2L, and daily weights encouraged. Educated to contact our office for weight gain of 2 lbs overnight or 5 lbs in one week. Care and ED precautions discussed.   2. Tachycardia Heart rate today is 114 bpm at rest, denies any symptoms.  Per review of her last monitor report, does tend to have a higher resting heart rate.  Previous monitor showed resting average heart rate of 99 bpm.  Will increase Lopressor  to 25 mg twice daily and decrease olmesartan  to 10 mg daily.  She will return in 1 week to repeat EKG. Heart healthy diet and regular cardiovascular exercise encouraged.   3. COPD Breathing has improved after diuresis. Past PFT's showed some abnormalities. She is scheduled to see Pulmonology later this month. Continue current medication regimen. Continue to follow with PCP. Care and ED precautions discussed.   I spent a total  duration of 30 minutes reviewing prior notes, reviewing outside records including  labs, EKG today, face-to-face counseling of medical condition, pathophysiology, evaluation, management, and documenting the findings in the note.   Dispo: Follow-up with Dr. Dorn Ross or APP in 6 months or sooner if anything changes.   Signed, Almarie Crate, NP

## 2024-10-06 ENCOUNTER — Encounter (HOSPITAL_COMMUNITY): Payer: MEDICAID

## 2024-10-06 ENCOUNTER — Telehealth: Payer: Self-pay | Admitting: Family Medicine

## 2024-10-06 NOTE — Telephone Encounter (Signed)
 DME [employer] form  Noted Copied Sleeved  Original placed in provider box Copy placed front desk folder

## 2024-10-09 ENCOUNTER — Encounter (HOSPITAL_COMMUNITY): Payer: MEDICAID

## 2024-10-09 NOTE — Telephone Encounter (Signed)
 They sent a big packet requesting signatures and addended ov notes but Hilario is on maternity leave. I will have to call [employer] to see what they recommend so the patient doesn't have to wait until she returns. Not sure if she should see another provider since the forms were started by [place]

## 2024-10-10 NOTE — Telephone Encounter (Signed)
 Placed them in Heidi [name]'s box to see if she will sign for [place]

## 2024-10-12 ENCOUNTER — Encounter (HOSPITAL_COMMUNITY): Payer: MEDICAID | Admitting: Physical Therapy

## 2024-10-12 ENCOUNTER — Ambulatory Visit: Payer: MEDICAID

## 2024-10-12 ENCOUNTER — Telehealth: Payer: Self-pay

## 2024-10-12 NOTE — Telephone Encounter (Signed)
 Copied from CRM [id number]. Topic: Clinical - Order For Equipment >> Oct 12, 2024  9:40 AM Hadassah PARAS wrote: Reason for CRM: Rep from [place] was calling in to advise us  that there is a missing signature on the incontinence supply . Please advise [phone]

## 2024-10-13 ENCOUNTER — Telehealth: Payer: Self-pay

## 2024-10-13 ENCOUNTER — Telehealth: Payer: Self-pay | Admitting: Family Medicine

## 2024-10-13 NOTE — Telephone Encounter (Signed)
 Copied from CRM [id number]. Topic: Clinical - Order For Equipment >> Oct 13, 2024  2:11 PM Hadassah PARAS wrote: Reason for CRM: Compnay Faxed over paperowrk for  pt getting a power wheelchair from [employer]  to [facility] [employer] on 10/10 and 10/17 and have not received a response.   Please Fax#[phone]   I mentioned that PCP is out of office and Rep stated that if a new provider will be signing off then pt would need to have a face to face w doctor taking over due to insurance purposes.

## 2024-10-13 NOTE — Telephone Encounter (Signed)
 Please advise Copied from CRM [id number]. Topic: Clinical - Order For Equipment >> Oct 13, 2024  2:06 PM Hadassah PARAS wrote: Reason for CRM: Fax over pt getting a power wheelchair from [employer]  to [facility] [employer] on 10/10 and 10/17 and have not received a response.  Please fax to Fax#[phone]

## 2024-10-15 ENCOUNTER — Other Ambulatory Visit: Payer: Self-pay | Admitting: Family Medicine

## 2024-10-15 ENCOUNTER — Other Ambulatory Visit: Payer: Self-pay | Admitting: Nurse Practitioner

## 2024-10-16 ENCOUNTER — Encounter (HOSPITAL_COMMUNITY): Payer: MEDICAID | Admitting: Physical Therapy

## 2024-10-16 NOTE — Telephone Encounter (Signed)
 Duplicate encounter, orders were placed in gloria box for signing

## 2024-10-16 NOTE — Telephone Encounter (Signed)
 We haven't received anything from them and they will need to refax

## 2024-10-18 ENCOUNTER — Other Ambulatory Visit: Payer: Self-pay | Admitting: Family Medicine

## 2024-10-18 ENCOUNTER — Ambulatory Visit: Payer: MEDICAID

## 2024-10-19 ENCOUNTER — Encounter (HOSPITAL_COMMUNITY): Payer: MEDICAID | Admitting: Physical Therapy

## 2024-10-23 ENCOUNTER — Encounter: Payer: Self-pay | Admitting: Radiology

## 2024-10-24 ENCOUNTER — Encounter (HOSPITAL_COMMUNITY): Payer: MEDICAID | Admitting: Physical Therapy

## 2024-10-27 ENCOUNTER — Encounter (HOSPITAL_COMMUNITY): Payer: MEDICAID

## 2024-10-30 ENCOUNTER — Encounter (HOSPITAL_COMMUNITY): Payer: MEDICAID

## 2024-11-02 ENCOUNTER — Emergency Department (HOSPITAL_COMMUNITY): Payer: MEDICAID

## 2024-11-02 ENCOUNTER — Other Ambulatory Visit: Payer: Self-pay

## 2024-11-02 ENCOUNTER — Encounter (HOSPITAL_COMMUNITY): Payer: MEDICAID

## 2024-11-02 ENCOUNTER — Emergency Department (HOSPITAL_COMMUNITY)
Admission: EM | Admit: 2024-11-02 | Discharge: 2024-11-02 | Disposition: A | Discharge status: Home / Self Care | Payer: MEDICAID | Source: Ambulatory Visit | Attending: Emergency Medicine | Admitting: Emergency Medicine

## 2024-11-02 DIAGNOSIS — Z7984 Long term (current) use of oral hypoglycemic drugs: Secondary | ICD-10-CM | POA: Insufficient documentation

## 2024-11-02 DIAGNOSIS — R600 Localized edema: Secondary | ICD-10-CM | POA: Diagnosis not present

## 2024-11-02 DIAGNOSIS — R112 Nausea with vomiting, unspecified: Secondary | ICD-10-CM | POA: Diagnosis present

## 2024-11-02 DIAGNOSIS — R109 Unspecified abdominal pain: Secondary | ICD-10-CM | POA: Diagnosis not present

## 2024-11-02 DIAGNOSIS — E119 Type 2 diabetes mellitus without complications: Secondary | ICD-10-CM | POA: Insufficient documentation

## 2024-11-02 DIAGNOSIS — R140 Abdominal distension (gaseous): Secondary | ICD-10-CM | POA: Diagnosis not present

## 2024-11-02 DIAGNOSIS — I509 Heart failure, unspecified: Secondary | ICD-10-CM | POA: Diagnosis not present

## 2024-11-02 LAB — URINALYSIS, ROUTINE W REFLEX MICROSCOPIC
Bacteria, UA: NONE SEEN
Bilirubin Urine: NEGATIVE
Glucose, UA: NEGATIVE mg/dL
Ketones, ur: NEGATIVE mg/dL
Leukocytes,Ua: NEGATIVE
Nitrite: NEGATIVE
Protein, ur: NEGATIVE mg/dL
Specific Gravity, Urine: 1.004 — ABNORMAL LOW (ref 1.005–1.030)
pH: 5 (ref 5.0–8.0)

## 2024-11-02 LAB — COMPREHENSIVE METABOLIC PANEL WITH GFR
ALT: 22 U/L (ref 0–44)
AST: 26 U/L (ref 15–41)
Albumin: 4.4 g/dL (ref 3.5–5.0)
Alkaline Phosphatase: 123 U/L (ref 38–126)
Anion gap: 15 (ref 5–15)
BUN: 16 mg/dL (ref 6–20)
CO2: 19 mmol/L — ABNORMAL LOW (ref 22–32)
Calcium: 10 mg/dL (ref 8.9–10.3)
Chloride: 103 mmol/L (ref 98–111)
Creatinine, Ser: 1.69 mg/dL — ABNORMAL HIGH (ref 0.44–1.00)
GFR, Estimated: 36 mL/min — ABNORMAL LOW (ref >60)
Glucose, Bld: 68 mg/dL — ABNORMAL LOW (ref 70–99)
Potassium: 4.3 mmol/L (ref 3.5–5.1)
Sodium: 137 mmol/L (ref 135–145)
Total Bilirubin: 0.5 mg/dL (ref 0.0–1.2)
Total Protein: 7.1 g/dL (ref 6.5–8.1)

## 2024-11-02 LAB — PRO BRAIN NATRIURETIC PEPTIDE: Pro Brain Natriuretic Peptide: 96.4 pg/mL (ref <300.0)

## 2024-11-02 LAB — CBC
HCT: 38.5 % (ref 36.0–46.0)
Hemoglobin: 12.9 g/dL (ref 12.0–15.0)
MCH: 27.1 pg (ref 26.0–34.0)
MCHC: 33.5 g/dL (ref 30.0–36.0)
MCV: 80.9 fL (ref 80.0–100.0)
Platelets: 168 K/uL (ref 150–400)
RBC: 4.76 MIL/uL (ref 3.87–5.11)
RDW: 16.1 % — ABNORMAL HIGH (ref 11.5–15.5)
WBC: 9.1 K/uL (ref 4.0–10.5)
nRBC: 0 % (ref 0.0–0.2)

## 2024-11-02 LAB — LIPASE, BLOOD: Lipase: 19 U/L (ref 11–51)

## 2024-11-02 LAB — CBG MONITORING, ED
Glucose-Capillary: 81 mg/dL (ref 70–99)
Glucose-Capillary: 99 mg/dL (ref 70–99)

## 2024-11-02 MED ORDER — ONDANSETRON HCL 4 MG PO TABS: 4.0000 mg | ORAL_TABLET | Freq: Four times a day (QID) | ORAL | 0 refills | Status: DC

## 2024-11-02 MED ORDER — IOHEXOL 300 MG/ML  SOLN
80.0000 mL | Freq: Once | INTRAMUSCULAR | Status: AC | PRN
  Administered 2024-11-02: 80 mL via INTRAVENOUS

## 2024-11-02 MED ORDER — HYDROMORPHONE HCL 1 MG/ML IJ SOLN
1.0000 mg | Freq: Once | INTRAMUSCULAR | Status: AC
  Administered 2024-11-02: 1 mg via INTRAVENOUS
  Filled 2024-11-02: qty 1

## 2024-11-02 MED ORDER — LACTATED RINGERS IV BOLUS
500.0000 mL | Freq: Once | INTRAVENOUS | Status: AC
  Administered 2024-11-02: 500 mL via INTRAVENOUS

## 2024-11-02 MED ORDER — ONDANSETRON HCL 4 MG/2ML IJ SOLN
4.0000 mg | Freq: Once | INTRAMUSCULAR | Status: AC
  Administered 2024-11-02: 4 mg via INTRAVENOUS
  Filled 2024-11-02: qty 2

## 2024-11-02 NOTE — ED Notes (Signed)
 Pt holding down fluids; no gag or emesis present.

## 2024-11-02 NOTE — ED Notes (Signed)
 Pt ambulated to restroom w/o assistance.

## 2024-11-02 NOTE — Discharge Instructions (Addendum)
 Thank you for visiting the Emergency Department today. It was a pleasure to be part of your healthcare team.  Your test results showed no acute findings.  As we discussed, please follow-up with your primary care provider on Monday for repeat labs to assess kidney function. If you have any questions about your medicines, please call your pharmacy or healthcare provider. At home, rest, hydrate, and resume normal diet. It is important to watch for warning signs such as worsening pain, fever, trouble breathing, chest pain, worsening nausea/vomiting/diarrhea. If any of these happen, return to the Emergency Department or call 911. Thank you for trusting us  with your health.

## 2024-11-02 NOTE — ED Provider Notes (Signed)
 [place] EMERGENCY DEPARTMENT AT [facility] Provider Note   CSN: [id number] Arrival date & time: 11/02/24  1202     Patient presents with: Emesis   Heidi [name] is a 53 y.o. female with a notable history of CHF and chronic alcohol use, who presents to the emergency department for 4 days of persistent nausea and vomiting.  The patient was evaluated earlier today by her PCP and sent to the ED for IV fluids due to hypotension noted in the clinic.  On arrival to the ED, she is mildly hypotensive but other vitals otherwise stable.  She denies diarrhea, fever, urinary symptoms, or other neurological complaints.  She reports having similar episodes in the past requiring hospital evaluation.     Emesis      Prior to Admission medications   Medication Sig Start Date End Date Taking? Authorizing Provider  ondansetron  (ZOFRAN ) 4 MG tablet Take 1 tablet (4 mg total) by mouth every 6 (six) hours. 11/02/24  Yes Kelis Plasse L, PA  Accu-Chek Softclix Lancets lancets USE TO TEST BLOOD SUGAR THREE TIMES DAILY 09/04/24   Bacchus, Gloria Z, FNP  albuterol  (PROVENTIL ) (2.5 MG/3ML) 0.083% nebulizer solution Take 3 mLs (2.5 mg total) by nebulization every 6 (six) hours as needed for wheezing or shortness of breath. 04/13/24   Del Wilhelmena Seanpatrick Maisano Sola, FNP  Blood Glucose Monitoring Suppl DEVI 1 each by Does not apply route in the morning, at noon, and at bedtime. May substitute to any manufacturer covered by patient's insurance. 03/22/24   Suellen Cantor A, PA-C  Blood Pressure Monitor DEVI 1 each by Does not apply route daily. 02/11/24   Miriam Norris, NP  busPIRone  (BUSPAR ) 30 MG tablet TAKE ONE TABLET BY MOUTH EVERY MORNING AND AT BEDTIME 10/16/24   Del Orbe Polanco, Iliana, FNP  cyclobenzaprine  (FLEXERIL ) 10 MG tablet Take 1 tablet (10 mg total) by mouth 2 (two) times daily as needed for muscle spasms. 04/21/24   Margrette Taft BRAVO, MD  dicyclomine  (BENTYL ) 20 MG tablet Take 1 tablet (20 mg  total) by mouth 2 (two) times daily. 09/10/24   Odell Balls, PA-C  diltiazem  (CARDIZEM  CD) 120 MG 24 hr capsule TAKE ONE CAPSULE BY MOUTH DAILY 10/16/24   Del Orbe Polanco, Sola, FNP  folic acid  (FOLVITE ) 1 MG tablet Take 1 tablet (1 mg total) by mouth daily. 07/25/23   Ricky Fines, MD  furosemide  (LASIX ) 20 MG tablet Take 1 tablet (20 mg total) by mouth daily. 04/04/24 10/05/24  Miriam Norris, NP  gabapentin  (NEURONTIN ) 400 MG capsule TAKE ONE CAPSULE BY MOUTH THREE TIMES DAILY 08/24/24   Margrette Taft BRAVO, MD  glucose blood (ACCU-CHEK GUIDE TEST) test strip Use as instructed three times daily dx e11.65 05/30/24   Del Orbe Polanco, Iliana, FNP  HYDROcodone -acetaminophen  (NORCO/VICODIN) 5-325 MG tablet Take 1 tablet by mouth every 4 (four) hours as needed. 05/23/24   [provider]  hydrOXYzine  (ATARAX ) 25 MG tablet Take 25 mg by mouth at bedtime.    [provider]  hydrOXYzine  (VISTARIL ) 50 MG capsule Take 1 capsule (50 mg total) by mouth 3 (three) times daily as needed. 09/28/24   Bacchus, Gloria Z, FNP  ibuprofen  (ADVIL ) 800 MG tablet Take 800 mg by mouth 3 (three) times daily as needed.    [provider]  levocetirizine (XYZAL ) 5 MG tablet Take 1 tablet (5 mg total) by mouth every evening. 10/16/24   Del Orbe Polanco, Iliana, FNP  lidocaine  (LIDODERM ) 5 % Place  1 patch onto the skin daily. Remove & Discard patch within 12 hours or as directed by MD 06/13/24   Del Wilhelmena Ashden Sonnenberg Sola, FNP  lidocaine  (XYLOCAINE ) 2 % solution Use as directed 15 mLs in the mouth or throat as needed. 05/24/24   Del Orbe Polanco, Iliana, FNP  metFORMIN  (GLUCOPHAGE ) 500 MG tablet TAKE ONE TABLET BY MOUTH TWICE DAILY WITH A MEAL 09/04/24   Bacchus, Gloria Z, FNP  metoprolol  tartrate (LOPRESSOR ) 25 MG tablet Take 1 tablet (25 mg total) by mouth 2 (two) times daily. 10/05/24   Miriam Norris, NP  Misc. Devices (GOJJI WEIGHT SCALE) MISC Daily Weight 02/16/24   Del Wilhelmena Matheus Spiker, Sola, FNP   naltrexone  (DEPADE) 50 MG tablet Take 1 tablet (50 mg total) by mouth daily. 10/18/24   Del Orbe Polanco, Sola, FNP  naproxen  (NAPROSYN ) 500 MG tablet TAKE (1) TABLET BY MOUTH TWICE DAILY WITH MEALS. 08/10/24   Brenna Lin, MD  norethindrone (MICRONOR) 0.35 MG tablet Take 1 tablet by mouth daily. 09/18/24   [provider]  olmesartan  (BENICAR ) 20 MG tablet Take 0.5 tablets (10 mg total) by mouth daily. 10/05/24   Miriam Norris, NP  pantoprazole  (PROTONIX ) 40 MG tablet Take 1 tablet (40 mg total) by mouth 2 (two) times daily. 11/15/24 11/15/25  Ezzard Sonny RAMAN, PA-C  polyethylene glycol powder (GLYCOLAX /MIRALAX ) 17 GM/SCOOP powder Take 17 g by mouth daily. Mix with 8 oz of water . 02/28/24   Harper, Kristen S, PA-C  potassium chloride  SA (KLOR-CON  M) 20 MEQ tablet Take 2 tablets (40 mEq total) by mouth daily for 4 days. 06/19/24 10/05/24  Rudy Josette RAMAN, PA-C  pregabalin  (LYRICA ) 50 MG capsule Take 1 capsule (50 mg total) by mouth 2 (two) times daily as needed. 06/16/24   Del Orbe Polanco, Iliana, FNP  rosuvastatin  (CRESTOR ) 40 MG tablet Take 1 tablet (40 mg total) by mouth daily. 04/04/24   Miriam Norris, NP  sertraline  (ZOLOFT ) 25 MG tablet Take 1 tablet (25 mg total) by mouth daily. 06/13/24   Del Wilhelmena Laronica Bhagat Sola, FNP  UNABLE TO FIND Power wheelchair x 1  DX F51.937 08/17/24   Del Wilhelmena Elyssia Strausser, Sola, FNP  varenicline  (CHANTIX ) 0.5 MG tablet TAKE ONE TABLET BY MOUTH TWICE DAILY 09/13/24   Del Wilhelmena Britnee Mcdevitt, Sola, FNP  VENTOLIN  HFA 108 (90 Base) MCG/ACT inhaler INHALE TWO PUFFS INTO THE LUNGS EVERY 6 HOURS AS NEEDED 08/29/24   Del Wilhelmena Rithvik Orcutt, Sola, FNP  Vitamin D , Ergocalciferol , (DRISDOL) 1.25 MG (50000 UNIT) CAPS capsule Take 50,000 Units by mouth once a week. 07/27/24   [provider]    Allergies: Patient has no known allergies.    Review of Systems  Gastrointestinal:  Positive for vomiting.    Updated Vital Signs BP 91/70   Pulse 82   Temp 98 F (36.7  C) (Oral)   Resp 16   Ht 5' 3 (1.6 m)   Wt 84.4 kg   LMP  (LMP Unknown)   SpO2 91%   BMI 32.95 kg/m   Physical Exam Vitals and nursing note reviewed.  Constitutional:      General: She is not in acute distress.    Appearance: Normal appearance.  HENT:     Head: Normocephalic and atraumatic.  Eyes:     Extraocular Movements: Extraocular movements intact.     Conjunctiva/sclera: Conjunctivae normal.     Pupils: Pupils are equal, round, and reactive to light.  Cardiovascular:     Rate and Rhythm: Normal rate and  regular rhythm.     Pulses: Normal pulses.  Pulmonary:     Effort: Pulmonary effort is normal. No respiratory distress.  Abdominal:     General: There is distension.     Palpations: Abdomen is soft.     Tenderness: There is abdominal tenderness.     Comments: Mild distention with tenderness to palpation, no guarding or rebound tenderness.  Musculoskeletal:        General: Normal range of motion.     Cervical back: Normal range of motion.     Right lower leg: 1+ Pitting Edema present.     Left lower leg: 1+ Pitting Edema present.  Skin:    General: Skin is warm and dry.     Capillary Refill: Capillary refill takes less than 2 seconds.  Neurological:     General: No focal deficit present.     Mental Status: She is alert. Mental status is at baseline.  Psychiatric:        Mood and Affect: Mood normal.     (all labs ordered are listed, but only abnormal results are displayed) Labs Reviewed  CBC - Abnormal; Notable for the following components:      Result Value   RDW 16.1 (*)    All other components within normal limits  URINALYSIS, ROUTINE W REFLEX MICROSCOPIC - Abnormal; Notable for the following components:   Specific Gravity, Urine 1.004 (*)    Hgb urine dipstick SMALL (*)    All other components within normal limits  COMPREHENSIVE METABOLIC PANEL WITH GFR - Abnormal; Notable for the following components:   CO2 19 (*)    Glucose, Bld 68 (*)     Creatinine, Ser 1.69 (*)    GFR, Estimated 36 (*)    All other components within normal limits  LIPASE, BLOOD  PRO BRAIN NATRIURETIC PEPTIDE  CBG MONITORING, ED  CBG MONITORING, ED    EKG: EKG Interpretation Date/Time:  Thursday November 02 2024 19:51:34 EST Ventricular Rate:  67 PR Interval:  182 QRS Duration:  78 QT Interval:  418 QTC Calculation: 442 R Axis:   75  Text Interpretation: Sinus rhythm Low voltage, precordial leads Confirmed by Jerral Meth [phone]) on 11/03/2024 11:09:47 AM  Radiology: Narrative & Impression CLINICAL DATA:  Abdominal pain. EXAM: CT ABDOMEN AND PELVIS WITH CONTRAST TECHNIQUE: Multidetector CT imaging of the abdomen and pelvis was performed using the standard protocol following bolus administration of intravenous contrast. RADIATION DOSE REDUCTION: This exam was performed according to the departmental dose-optimization program which includes automated exposure control, adjustment of the mA and/or kV according to patient size and/or use of iterative reconstruction technique. CONTRAST:  80mL OMNIPAQUE  IOHEXOL  300 MG/ML  SOLN COMPARISON:  CT abdomen pelvis dated 02/08/2024. FINDINGS: Lower chest: The visualized lung bases are clear. No intra-abdominal free air or free fluid. Hepatobiliary: The liver is unremarkable. No dilatation. The gallbladder is unremarkable. Pancreas: Unremarkable. No pancreatic ductal dilatation or surrounding inflammatory changes. Spleen: Normal in size without focal abnormality. Adrenals/Urinary Tract: The adrenal glands unremarkable. There is no hydronephrosis on either side. Apparent slight delayed enhancement and excretion of contrast by the left kidney of indeterminate etiology. The visualized ureters and urinary bladder appear unremarkable. Stomach/Bowel: There is no bowel obstruction or active inflammation. The appendix is not visualized with certainty. No inflammatory changes identified in the right  lower quadrant. Vascular/Lymphatic: Mild aortoiliac atherosclerotic disease. The IVC is unremarkable. No portal venous gas. There is no adenopathy. Reproductive: The uterus is anteverted. There is a 3  cm from the thyroid . No suspicious adnexal masses. Other: None Musculoskeletal: No acute or significant osseous findings. IMPRESSION: 1. No acute intra-abdominal or pelvic pathology. 2.  Aortic Atherosclerosis (ICD10-I70.0). Electronically Signed By: Vanetta Chou M.D. On: 11/02/2024 17:00    Procedures   Medications Ordered in the ED  ondansetron  (ZOFRAN ) injection 4 mg (4 mg Intravenous Given 11/02/24 1413)  lactated ringers  bolus 500 mL (0 mLs Intravenous Stopped 11/02/24 1513)  iohexol  (OMNIPAQUE ) 300 MG/ML solution 80 mL (80 mLs Intravenous Contrast Given 11/02/24 1639)  lactated ringers  bolus 500 mL (0 mLs Intravenous Stopped 11/02/24 1929)  HYDROmorphone  (DILAUDID ) injection 1 mg (1 mg Intravenous Given 11/02/24 1829)                                    Medical Decision Making Amount and/or Complexity of Data Reviewed Labs: ordered. Radiology: ordered.  Risk Prescription drug management.   Patient presents to the ED for concern of nausea and vomiting x 4 days, this involves an extensive number of treatment options, and is a complaint that carries with it a high risk of complications and morbidity.    The differential diagnosis includes: Infectious etiology  Gastritis/Colitis  Co morbidities that complicate the patient evaluation: CHF ETOH DMT2  Additional history obtained: Additional history obtained from  Outside Medical Records and Past Admission  External records from outside source obtained and reviewed including PCP records.  Lab Tests: I ordered, and personally interpreted labs.   The pertinent results include:  CMP - creatinine 1.69 - chronically elevated CMP - glucose 68  Imaging Studies ordered: I ordered imaging studies including: CT abdomen   I independently visualized and interpreted imaging which showed: 1. No acute intra-abdominal or pelvic pathology.  I agree with the radiologist interpretation  Cardiac Monitoring: The patient was maintained on a cardiac monitor.  I personally viewed and interpreted the cardiac monitored which showed an underlying rhythm of: Normal Sinus Rhythm   Medicines ordered and prescription drug management: I ordered medications: Lactated ringer  fluid bolus Zofran  4mg  Dilaudid  1mg   Reevaluation of the patient after these medicines showed that the patient improved I have reviewed the patients home medicines and have made adjustments as needed  Problem List / ED Course: Problem List: Nausea/vomiting  Hypotension Emergency Department Course: The patient presented with multiple symptoms including nausea/vomiting with hypotension. Initial assessment included history, physical exam, and review of prior medical records.  Laboratory workup obtained.  Creatinine was elevated but consistent with her chronic baseline.  No acute electrolyte abnormalities, no leukocytosis, no other significant laboratory findings were noted.  EKG showed normal sinus rhythm with no acute ischemic changes.  Given abdominal tenderness, CT abdomen/pelvis was obtained, which showed no acute intra-abdominal pathology.  During ED stay, patient was treated with Zofran , IV fluids, and hydromorphone  for pain, with improvement in symptoms.  Patient was also able to tolerate PO intake, as patients glucose was low on arrival, was able to successfully raise her glucose.  Orthostatic vital signs were checked and improved with fluid resuscitation.  She remained hemodynamically stable, tolerated oral intake, and reported symptom relief.  Given reassuring imaging, stable labs relative to baseline, and improvement with treatment, the patient was deemed appropriate for discharge.   Reevaluation: After the interventions noted above, I reevaluated the  patient and found that they have :improved  Dispostion: Patient clear for discharge. She was instructed to follow-up closely with her PCP for  continued management, including reassessment of her hydration status, chronic conditions, and alcohol related concerns.  Strict return precautions were discussed including worsening abdominal pain, persistent vomiting, inability to tolerate PO, fever, dizziness, or recurrent hypotension.     Final diagnoses:  Nausea and vomiting, unspecified vomiting type  Abdominal pain, unspecified abdominal location    ED Discharge Orders          Ordered    ondansetron  (ZOFRAN ) 4 MG tablet  Every 6 hours        11/02/24 2142               Lowen Mansouri L, [place] 11/17/24 1525    Francesca Elsie CROME, MD 11/18/24 1341

## 2024-11-02 NOTE — ED Triage Notes (Signed)
 Sent from [facility] to receive fluids for Hypotension and Nausea.  Pt complains of abd pain and nausea x 2 days. Unable to vomiting. Attempted to dry heave in triage. Denies any diarrhea.

## 2024-11-06 ENCOUNTER — Ambulatory Visit: Payer: MEDICAID | Admitting: Cardiology

## 2024-11-15 ENCOUNTER — Other Ambulatory Visit: Payer: Self-pay | Admitting: Internal Medicine

## 2024-11-15 DIAGNOSIS — R6881 Early satiety: Secondary | ICD-10-CM

## 2024-11-15 DIAGNOSIS — D649 Anemia, unspecified: Secondary | ICD-10-CM

## 2024-11-20 ENCOUNTER — Other Ambulatory Visit: Payer: Self-pay

## 2024-11-20 ENCOUNTER — Observation Stay (HOSPITAL_COMMUNITY)
Admission: EM | Admit: 2024-11-20 | Discharge: 2024-11-21 | Disposition: A | Discharge status: Home / Self Care | Payer: MEDICAID | Attending: Family Medicine | Admitting: Family Medicine

## 2024-11-20 ENCOUNTER — Emergency Department (HOSPITAL_COMMUNITY): Payer: MEDICAID

## 2024-11-20 ENCOUNTER — Encounter (HOSPITAL_COMMUNITY): Payer: Self-pay

## 2024-11-20 DIAGNOSIS — D649 Anemia, unspecified: Secondary | ICD-10-CM

## 2024-11-20 DIAGNOSIS — F419 Anxiety disorder, unspecified: Secondary | ICD-10-CM | POA: Diagnosis not present

## 2024-11-20 DIAGNOSIS — E11649 Type 2 diabetes mellitus with hypoglycemia without coma: Secondary | ICD-10-CM | POA: Diagnosis not present

## 2024-11-20 DIAGNOSIS — Z87891 Personal history of nicotine dependence: Secondary | ICD-10-CM | POA: Insufficient documentation

## 2024-11-20 DIAGNOSIS — K219 Gastro-esophageal reflux disease without esophagitis: Secondary | ICD-10-CM | POA: Diagnosis not present

## 2024-11-20 DIAGNOSIS — Z79899 Other long term (current) drug therapy: Secondary | ICD-10-CM | POA: Diagnosis not present

## 2024-11-20 DIAGNOSIS — F32A Depression, unspecified: Secondary | ICD-10-CM | POA: Insufficient documentation

## 2024-11-20 DIAGNOSIS — I10 Essential (primary) hypertension: Secondary | ICD-10-CM | POA: Diagnosis not present

## 2024-11-20 DIAGNOSIS — Z8709 Personal history of other diseases of the respiratory system: Secondary | ICD-10-CM

## 2024-11-20 DIAGNOSIS — R6881 Early satiety: Secondary | ICD-10-CM

## 2024-11-20 DIAGNOSIS — E860 Dehydration: Principal | ICD-10-CM | POA: Insufficient documentation

## 2024-11-20 DIAGNOSIS — F10239 Alcohol dependence with withdrawal, unspecified: Secondary | ICD-10-CM | POA: Diagnosis not present

## 2024-11-20 DIAGNOSIS — E872 Acidosis, unspecified: Secondary | ICD-10-CM

## 2024-11-20 DIAGNOSIS — M47812 Spondylosis without myelopathy or radiculopathy, cervical region: Secondary | ICD-10-CM

## 2024-11-20 DIAGNOSIS — R112 Nausea with vomiting, unspecified: Secondary | ICD-10-CM | POA: Diagnosis present

## 2024-11-20 LAB — CBC WITH DIFFERENTIAL/PLATELET
Abs Immature Granulocytes: 0.03 K/uL (ref 0.00–0.07)
Basophils Absolute: 0.1 K/uL (ref 0.0–0.1)
Basophils Relative: 1 %
Eosinophils Absolute: 0 K/uL (ref 0.0–0.5)
Eosinophils Relative: 0 %
HCT: 37.8 % (ref 36.0–46.0)
Hemoglobin: 12.6 g/dL (ref 12.0–15.0)
Immature Granulocytes: 0 %
Lymphocytes Relative: 18 %
Lymphs Abs: 1.8 K/uL (ref 0.7–4.0)
MCH: 26.9 pg (ref 26.0–34.0)
MCHC: 33.3 g/dL (ref 30.0–36.0)
MCV: 80.6 fL (ref 80.0–100.0)
Monocytes Absolute: 0.5 K/uL (ref 0.1–1.0)
Monocytes Relative: 5 %
Neutro Abs: 7.2 K/uL (ref 1.7–7.7)
Neutrophils Relative %: 76 %
Platelets: 270 K/uL (ref 150–400)
RBC: 4.69 MIL/uL (ref 3.87–5.11)
RDW: 17.9 % — ABNORMAL HIGH (ref 11.5–15.5)
WBC: 9.5 K/uL (ref 4.0–10.5)
nRBC: 0 % (ref 0.0–0.2)

## 2024-11-20 LAB — COMPREHENSIVE METABOLIC PANEL WITH GFR
ALT: 101 U/L — ABNORMAL HIGH (ref 0–44)
AST: 170 U/L — ABNORMAL HIGH (ref 15–41)
Albumin: 5.4 g/dL — ABNORMAL HIGH (ref 3.5–5.0)
Alkaline Phosphatase: 138 U/L — ABNORMAL HIGH (ref 38–126)
Anion gap: 23 — ABNORMAL HIGH (ref 5–15)
BUN: 10 mg/dL (ref 6–20)
CO2: 17 mmol/L — ABNORMAL LOW (ref 22–32)
Calcium: 10.8 mg/dL — ABNORMAL HIGH (ref 8.9–10.3)
Chloride: 104 mmol/L (ref 98–111)
Creatinine, Ser: 0.94 mg/dL (ref 0.44–1.00)
GFR, Estimated: >60 mL/min (ref >60)
Glucose, Bld: 78 mg/dL (ref 70–99)
Potassium: 4.5 mmol/L (ref 3.5–5.1)
Sodium: 144 mmol/L (ref 135–145)
Total Bilirubin: 1.1 mg/dL (ref 0.0–1.2)
Total Protein: 8.6 g/dL — ABNORMAL HIGH (ref 6.5–8.1)

## 2024-11-20 LAB — URINALYSIS, ROUTINE W REFLEX MICROSCOPIC
Bacteria, UA: NONE SEEN
Bilirubin Urine: NEGATIVE
Glucose, UA: NEGATIVE mg/dL
Hgb urine dipstick: NEGATIVE
Ketones, ur: 80 mg/dL — AB
Leukocytes,Ua: NEGATIVE
Nitrite: NEGATIVE
Protein, ur: 30 mg/dL — AB
Specific Gravity, Urine: >1.046 — ABNORMAL HIGH (ref 1.005–1.030)
pH: 5 (ref 5.0–8.0)

## 2024-11-20 LAB — BASIC METABOLIC PANEL WITH GFR
Anion gap: 21 — ABNORMAL HIGH (ref 5–15)
BUN: 10 mg/dL (ref 6–20)
CO2: 14 mmol/L — ABNORMAL LOW (ref 22–32)
Calcium: 9.7 mg/dL (ref 8.9–10.3)
Chloride: 103 mmol/L (ref 98–111)
Creatinine, Ser: 0.79 mg/dL (ref 0.44–1.00)
GFR, Estimated: >60 mL/min (ref >60)
Glucose, Bld: 61 mg/dL — ABNORMAL LOW (ref 70–99)
Potassium: 4.3 mmol/L (ref 3.5–5.1)
Sodium: 138 mmol/L (ref 135–145)

## 2024-11-20 LAB — LIPASE, BLOOD: Lipase: 26 U/L (ref 11–51)

## 2024-11-20 LAB — ETHANOL: Alcohol, Ethyl (B): <15 mg/dL (ref <15)

## 2024-11-20 LAB — GLUCOSE, CAPILLARY
Glucose-Capillary: 68 mg/dL — ABNORMAL LOW (ref 70–99)
Glucose-Capillary: 72 mg/dL (ref 70–99)

## 2024-11-20 LAB — CBG MONITORING, ED: Glucose-Capillary: 90 mg/dL (ref 70–99)

## 2024-11-20 MED ORDER — LORAZEPAM 2 MG/ML IJ SOLN
1.0000 mg | INTRAMUSCULAR | Status: DC | PRN
  Administered 2024-11-20: 1 mg via INTRAVENOUS
  Filled 2024-11-20: qty 1

## 2024-11-20 MED ORDER — METOCLOPRAMIDE HCL 5 MG/ML IJ SOLN
10.0000 mg | Freq: Once | INTRAMUSCULAR | Status: AC
  Administered 2024-11-20: 10 mg via INTRAVENOUS
  Filled 2024-11-20: qty 2

## 2024-11-20 MED ORDER — LORAZEPAM 2 MG/ML IJ SOLN
1.0000 mg | Freq: Once | INTRAMUSCULAR | Status: AC
  Administered 2024-11-20: 1 mg via INTRAVENOUS
  Filled 2024-11-20: qty 1

## 2024-11-20 MED ORDER — DILTIAZEM HCL ER COATED BEADS 120 MG PO CP24
120.0000 mg | ORAL_CAPSULE | Freq: Every day | ORAL | Status: DC
  Administered 2024-11-21: 120 mg via ORAL
  Filled 2024-11-20: qty 1

## 2024-11-20 MED ORDER — FOLIC ACID 1 MG PO TABS
1.0000 mg | ORAL_TABLET | Freq: Every day | ORAL | Status: DC
  Administered 2024-11-21: 1 mg via ORAL
  Filled 2024-11-20: qty 1

## 2024-11-20 MED ORDER — LACTATED RINGERS IV SOLN: INTRAVENOUS | Status: AC

## 2024-11-20 MED ORDER — SODIUM CHLORIDE 0.9 % IV SOLN: INTRAVENOUS | Status: DC

## 2024-11-20 MED ORDER — INSULIN ASPART 100 UNIT/ML IJ SOLN
0.0000 [IU] | Freq: Three times a day (TID) | INTRAMUSCULAR | Status: DC
  Administered 2024-11-21: 1 [IU] via SUBCUTANEOUS

## 2024-11-20 MED ORDER — METOPROLOL TARTRATE 25 MG PO TABS
25.0000 mg | ORAL_TABLET | Freq: Two times a day (BID) | ORAL | Status: DC
  Administered 2024-11-21 (×2): 25 mg via ORAL
  Filled 2024-11-20 (×2): qty 1

## 2024-11-20 MED ORDER — LACTATED RINGERS IV BOLUS
1000.0000 mL | Freq: Once | INTRAVENOUS | Status: AC
  Administered 2024-11-20: 1000 mL via INTRAVENOUS

## 2024-11-20 MED ORDER — INSULIN ASPART 100 UNIT/ML IJ SOLN: 0.0000 [IU] | Freq: Every day | INTRAMUSCULAR | Status: DC

## 2024-11-20 MED ORDER — PANTOPRAZOLE SODIUM 40 MG PO TBEC
40.0000 mg | DELAYED_RELEASE_TABLET | Freq: Two times a day (BID) | ORAL | Status: DC
  Administered 2024-11-21 (×2): 40 mg via ORAL
  Filled 2024-11-20 (×2): qty 1

## 2024-11-20 MED ORDER — THIAMINE HCL 100 MG/ML IJ SOLN
100.0000 mg | Freq: Once | INTRAMUSCULAR | Status: AC
  Administered 2024-11-20: 100 mg via INTRAVENOUS
  Filled 2024-11-20: qty 2

## 2024-11-20 MED ORDER — PANTOPRAZOLE SODIUM 40 MG IV SOLR
40.0000 mg | Freq: Once | INTRAVENOUS | Status: AC
  Administered 2024-11-20: 40 mg via INTRAVENOUS
  Filled 2024-11-20: qty 10

## 2024-11-20 MED ORDER — ENOXAPARIN SODIUM 40 MG/0.4ML IJ SOSY
40.0000 mg | PREFILLED_SYRINGE | INTRAMUSCULAR | Status: DC
  Administered 2024-11-20: 40 mg via SUBCUTANEOUS
  Filled 2024-11-20: qty 0.4

## 2024-11-20 MED ORDER — ONDANSETRON HCL 4 MG/2ML IJ SOLN
4.0000 mg | Freq: Once | INTRAMUSCULAR | Status: AC
  Administered 2024-11-20: 4 mg via INTRAVENOUS
  Filled 2024-11-20: qty 2

## 2024-11-20 MED ORDER — ONDANSETRON HCL 4 MG/2ML IJ SOLN
4.0000 mg | Freq: Once | INTRAMUSCULAR | Status: AC | PRN
  Administered 2024-11-20: 4 mg via INTRAVENOUS
  Filled 2024-11-20: qty 2

## 2024-11-20 MED ORDER — HYDROCODONE-ACETAMINOPHEN 10-325 MG PO TABS
1.0000 | ORAL_TABLET | Freq: Four times a day (QID) | ORAL | Status: DC
  Administered 2024-11-21 (×3): 1 via ORAL
  Filled 2024-11-20 (×3): qty 1

## 2024-11-20 MED ORDER — HYDROMORPHONE HCL 1 MG/ML IJ SOLN
0.5000 mg | Freq: Once | INTRAMUSCULAR | Status: AC
  Administered 2024-11-20: 0.5 mg via INTRAVENOUS
  Filled 2024-11-20: qty 0.5

## 2024-11-20 MED ORDER — IRBESARTAN 75 MG PO TABS
75.0000 mg | ORAL_TABLET | Freq: Every day | ORAL | Status: DC
  Administered 2024-11-21: 75 mg via ORAL
  Filled 2024-11-20: qty 1

## 2024-11-20 MED ORDER — SODIUM CHLORIDE 0.9 % IV BOLUS
500.0000 mL | Freq: Once | INTRAVENOUS | Status: AC
  Administered 2024-11-20: 500 mL via INTRAVENOUS

## 2024-11-20 MED ORDER — LORAZEPAM 1 MG PO TABS
1.0000 mg | ORAL_TABLET | ORAL | Status: DC | PRN
  Administered 2024-11-21 (×2): 1 mg via ORAL
  Filled 2024-11-20 (×2): qty 1

## 2024-11-20 MED ORDER — IOHEXOL 300 MG/ML  SOLN
100.0000 mL | Freq: Once | INTRAMUSCULAR | Status: AC | PRN
  Administered 2024-11-20: 100 mL via INTRAVENOUS

## 2024-11-20 MED ORDER — ROSUVASTATIN CALCIUM 20 MG PO TABS
40.0000 mg | ORAL_TABLET | Freq: Every day | ORAL | Status: DC
  Administered 2024-11-21: 40 mg via ORAL
  Filled 2024-11-20: qty 2

## 2024-11-20 MED ORDER — MORPHINE SULFATE (PF) 4 MG/ML IV SOLN
4.0000 mg | Freq: Once | INTRAVENOUS | Status: AC
  Administered 2024-11-20: 4 mg via INTRAVENOUS
  Filled 2024-11-20: qty 1

## 2024-11-20 MED ORDER — SODIUM CHLORIDE 0.9 % IV SOLN
25.0000 mg | Freq: Four times a day (QID) | INTRAVENOUS | Status: DC | PRN
  Administered 2024-11-21: 25 mg via INTRAVENOUS
  Filled 2024-11-20: qty 1

## 2024-11-20 MED ORDER — BUSPIRONE HCL 15 MG PO TABS
30.0000 mg | ORAL_TABLET | Freq: Three times a day (TID) | ORAL | Status: DC
  Administered 2024-11-21 (×2): 30 mg via ORAL
  Filled 2024-11-20 (×2): qty 2

## 2024-11-20 MED ORDER — DIPHENHYDRAMINE HCL 50 MG/ML IJ SOLN
25.0000 mg | Freq: Once | INTRAMUSCULAR | Status: AC
  Administered 2024-11-20: 25 mg via INTRAVENOUS
  Filled 2024-11-20: qty 1

## 2024-11-20 MED ORDER — SERTRALINE HCL 25 MG PO TABS
25.0000 mg | ORAL_TABLET | Freq: Every day | ORAL | Status: DC
  Administered 2024-11-21: 25 mg via ORAL
  Filled 2024-11-20: qty 1

## 2024-11-20 MED ORDER — DEXTROSE 5 % AND 0.9 % NACL IV BOLUS
1000.0000 mL | Freq: Once | INTRAVENOUS | Status: AC
  Administered 2024-11-20: 1000 mL via INTRAVENOUS

## 2024-11-20 MED ORDER — MORPHINE SULFATE (PF) 2 MG/ML IV SOLN
2.0000 mg | INTRAVENOUS | Status: DC | PRN
  Administered 2024-11-20 – 2024-11-21 (×3): 2 mg via INTRAVENOUS
  Filled 2024-11-20 (×3): qty 1

## 2024-11-20 MED ORDER — THIAMINE MONONITRATE 100 MG PO TABS
100.0000 mg | ORAL_TABLET | Freq: Every day | ORAL | Status: DC
  Administered 2024-11-21: 100 mg via ORAL
  Filled 2024-11-20: qty 1

## 2024-11-20 MED ORDER — GABAPENTIN 400 MG PO CAPS
400.0000 mg | ORAL_CAPSULE | Freq: Three times a day (TID) | ORAL | Status: DC
  Administered 2024-11-21 (×2): 400 mg via ORAL
  Filled 2024-11-20 (×2): qty 1

## 2024-11-20 NOTE — ED Provider Notes (Signed)
  EMERGENCY DEPARTMENT AT [facility] Provider Note   CSN: [id number] Arrival date & time: 11/20/24  1127     Patient presents with: Nausea   Heidi [name] is a 53 y.o. female.   Patient is a 53 year old female who presents emergency department with a chief complaint of nausea, vomiting which has been ongoing for approximate the past 2 days.  She is requesting IV fluids at this time.  Patient notes that she has been trying to cut back on her alcohol use but notes that she has been occasionally using.  Patient denies any associated abdominal pain at this time.  She denies any associated chest pain, shortness breath, abdominal pain, nausea, vomiting, diarrhea.  She has had no associated dizziness, lightheadedness or syncope.  No associated dysuria or hematuria.        Prior to Admission medications   Medication Sig Start Date End Date Taking? Authorizing Provider  Accu-Chek Softclix Lancets lancets USE TO TEST BLOOD SUGAR THREE TIMES DAILY 09/04/24   Bacchus, Meade PEDLAR, FNP  albuterol  (PROVENTIL ) (2.5 MG/3ML) 0.083% nebulizer solution Take 3 mLs (2.5 mg total) by nebulization every 6 (six) hours as needed for wheezing or shortness of breath. 04/13/24   [name], FNP  Blood Glucose Monitoring Suppl DEVI 1 each by Does not apply route in the morning, at noon, and at bedtime. May substitute to any manufacturer covered by patient's insurance. 03/22/24   Suellen Cantor A, PA-C  Blood Pressure Monitor DEVI 1 each by Does not apply route daily. 02/11/24   Miriam Norris, NP  busPIRone  (BUSPAR ) 30 MG tablet TAKE ONE TABLET BY MOUTH EVERY MORNING AND AT BEDTIME 10/16/24   Del Orbe Polanco, Iliana, FNP  cyclobenzaprine  (FLEXERIL ) 10 MG tablet Take 1 tablet (10 mg total) by mouth 2 (two) times daily as needed for muscle spasms. 04/21/24   Margrette Taft BRAVO, MD  dicyclomine  (BENTYL ) 20 MG tablet Take 1 tablet (20 mg total) by mouth 2 (two) times daily. 09/10/24    Odell Balls, PA-C  diltiazem  (CARDIZEM  CD) 120 MG 24 hr capsule TAKE ONE CAPSULE BY MOUTH DAILY 10/16/24   Del Orbe Polanco, Iliana, FNP  folic acid  (FOLVITE ) 1 MG tablet Take 1 tablet (1 mg total) by mouth daily. 07/25/23   Ricky Fines, MD  furosemide  (LASIX ) 20 MG tablet Take 1 tablet (20 mg total) by mouth daily. 04/04/24 10/05/24  Miriam Norris, NP  gabapentin  (NEURONTIN ) 400 MG capsule TAKE ONE CAPSULE BY MOUTH THREE TIMES DAILY 08/24/24   Margrette Taft BRAVO, MD  glucose blood (ACCU-CHEK GUIDE TEST) test strip Use as instructed three times daily dx e11.65 05/30/24   Del Orbe Polanco, Iliana, FNP  HYDROcodone -acetaminophen  (NORCO/VICODIN) 5-325 MG tablet Take 1 tablet by mouth every 4 (four) hours as needed. 05/23/24   [provider]  hydrOXYzine  (ATARAX ) 25 MG tablet Take 25 mg by mouth at bedtime.    [provider]  hydrOXYzine  (VISTARIL ) 50 MG capsule Take 1 capsule (50 mg total) by mouth 3 (three) times daily as needed. 09/28/24   Bacchus, Gloria Z, FNP  ibuprofen  (ADVIL ) 800 MG tablet Take 800 mg by mouth 3 (three) times daily as needed.    [provider]  levocetirizine (XYZAL ) 5 MG tablet Take 1 tablet (5 mg total) by mouth every evening. 10/16/24   [name], FNP  lidocaine  (LIDODERM ) 5 % Place 1 patch onto the skin daily. Remove & Discard patch within 12 hours or as  directed by MD 06/13/24   [name], FNP  lidocaine  (XYLOCAINE ) 2 % solution Use as directed 15 mLs in the mouth or throat as needed. 05/24/24   Del Orbe Polanco, Iliana, FNP  metFORMIN  (GLUCOPHAGE ) 500 MG tablet TAKE ONE TABLET BY MOUTH TWICE DAILY WITH A MEAL 09/04/24   Bacchus, Gloria Z, FNP  metoprolol  tartrate (LOPRESSOR ) 25 MG tablet Take 1 tablet (25 mg total) by mouth 2 (two) times daily. 10/05/24   Miriam Norris, NP  Misc. Devices (GOJJI WEIGHT SCALE) MISC Daily Weight 02/16/24   Del Wilhelmena Lloyd, Sola, FNP  naltrexone  (DEPADE) 50 MG tablet Take 1 tablet (50  mg total) by mouth daily. 10/18/24   Del Wilhelmena Lloyd, Sola, FNP  naproxen  (NAPROSYN ) 500 MG tablet TAKE (1) TABLET BY MOUTH TWICE DAILY WITH MEALS. 08/10/24   Brenna Lin, MD  norethindrone (MICRONOR) 0.35 MG tablet Take 1 tablet by mouth daily. 09/18/24   [provider]  olmesartan  (BENICAR ) 20 MG tablet Take 0.5 tablets (10 mg total) by mouth daily. 10/05/24   Miriam Norris, NP  ondansetron  (ZOFRAN ) 4 MG tablet Take 1 tablet (4 mg total) by mouth every 6 (six) hours. 11/02/24   Willma Bouchard L, PA  pantoprazole  (PROTONIX ) 40 MG tablet Take 1 tablet (40 mg total) by mouth 2 (two) times daily. 11/15/24 11/15/25  Ezzard Sonny RAMAN, PA-C  polyethylene glycol powder (GLYCOLAX /MIRALAX ) 17 GM/SCOOP powder Take 17 g by mouth daily. Mix with 8 oz of water . 02/28/24   Harper, Kristen S, PA-C  potassium chloride  SA (KLOR-CON  M) 20 MEQ tablet Take 2 tablets (40 mEq total) by mouth daily for 4 days. 06/19/24 10/05/24  Rudy Josette RAMAN, PA-C  pregabalin  (LYRICA ) 50 MG capsule Take 1 capsule (50 mg total) by mouth 2 (two) times daily as needed. 06/16/24   Del Orbe Polanco, Iliana, FNP  rosuvastatin  (CRESTOR ) 40 MG tablet Take 1 tablet (40 mg total) by mouth daily. 04/04/24   Miriam Norris, NP  sertraline  (ZOLOFT ) 25 MG tablet Take 1 tablet (25 mg total) by mouth daily. 06/13/24   [name], FNP  UNABLE TO FIND Power wheelchair x 1  DX F51.937 08/17/24   Del Wilhelmena Lloyd, Sola, FNP  varenicline  (CHANTIX ) 0.5 MG tablet TAKE ONE TABLET BY MOUTH TWICE DAILY 09/13/24   Del Wilhelmena Lloyd, Sola, FNP  VENTOLIN  HFA 108 (90 Base) MCG/ACT inhaler INHALE TWO PUFFS INTO THE LUNGS EVERY 6 HOURS AS NEEDED 08/29/24   [name], FNP  Vitamin D , Ergocalciferol , (DRISDOL) 1.25 MG (50000 UNIT) CAPS capsule Take 50,000 Units by mouth once a week. 07/27/24   [provider]    Allergies: Patient has no known allergies.    Review of Systems  Gastrointestinal:  Positive for nausea and  vomiting.  All other systems reviewed and are negative.   Updated Vital Signs BP (!) 153/84 (BP Location: Right Arm)   Pulse (!) 145   Temp 99.1 F (37.3 C) (Oral)   Resp (!) 22   Ht 5' 3 (1.6 m)   Wt 84.4 kg   LMP  (LMP Unknown)   SpO2 97%   BMI 32.95 kg/m   Physical Exam Vitals and nursing note reviewed.  Constitutional:      General: She is not in acute distress.    Appearance: Normal appearance. She is not ill-appearing.  HENT:     Head: Normocephalic and atraumatic.     Nose: Nose normal.     Mouth/Throat:  Mouth: Mucous membranes are moist.  Eyes:     Extraocular Movements: Extraocular movements intact.     Conjunctiva/sclera: Conjunctivae normal.     Pupils: Pupils are equal, round, and reactive to light.  Cardiovascular:     Rate and Rhythm: Normal rate and regular rhythm.     Pulses: Normal pulses.     Heart sounds: Normal heart sounds. No murmur heard.    No gallop.  Pulmonary:     Effort: Pulmonary effort is normal. No respiratory distress.     Breath sounds: Normal breath sounds. No stridor. No wheezing, rhonchi or rales.  Abdominal:     General: Abdomen is flat. Bowel sounds are normal. There is no distension.     Palpations: Abdomen is soft.     Tenderness: There is no abdominal tenderness. There is no guarding.  Musculoskeletal:        General: Normal range of motion.     Cervical back: Normal range of motion and neck supple. No rigidity or tenderness.  Skin:    General: Skin is warm and dry.     Findings: No bruising or rash.  Neurological:     General: No focal deficit present.     Mental Status: She is alert and oriented to person, place, and time. Mental status is at baseline.     Cranial Nerves: No cranial nerve deficit.     Sensory: No sensory deficit.     Motor: No weakness.     Coordination: Coordination normal.     Gait: Gait normal.  Psychiatric:        Mood and Affect: Mood normal.        Behavior: Behavior normal.         Thought Content: Thought content normal.        Judgment: Judgment normal.     (all labs ordered are listed, but only abnormal results are displayed) Labs Reviewed  LIPASE, BLOOD  COMPREHENSIVE METABOLIC PANEL WITH GFR  URINALYSIS, ROUTINE W REFLEX MICROSCOPIC  CBC WITH DIFFERENTIAL/PLATELET  ETHANOL    EKG: None  Radiology: No results found.   Procedures   Medications Ordered in the ED  ondansetron  (ZOFRAN ) injection 4 mg (has no administration in time range)  lactated ringers  bolus 1,000 mL (has no administration in time range)  lactated ringers  bolus 1,000 mL (has no administration in time range)  ondansetron  (ZOFRAN ) injection 4 mg (has no administration in time range)  LORazepam  (ATIVAN ) injection 1 mg (has no administration in time range)  morphine  (PF) 4 MG/ML injection 4 mg (has no administration in time range)    Clinical Course as of 11/20/24 1642  Mon Nov 20, 2024  1641 CT ABDOMEN PELVIS W CONTRAST [KF]    Clinical Course User Index [KF] Jurline Larraine LELON Jann                                 Medical Decision Making Amount and/or Complexity of Data Reviewed Labs: ordered. Radiology: ordered.  Risk Prescription drug management. Decision regarding hospitalization.   This patient presents to the ED for concern of nausea, vomiting, this involves an extensive number of treatment options, and is a complaint that carries with it a high risk of complications and morbidity.  The differential diagnosis includes enteritis, alcohol withdrawal, alcohol intoxication, acidosis, electrolyte derangement, acute kidney injury, acute appendicitis, cholecystitis, small bowel obstruction, diverticulitis, pyelonephritis, kidney stone, pancreatitis   Co morbidities that  complicate the patient evaluation  Alcohol abuse, diabetes, GERD, hypertension   Additional history obtained:  Additional history obtained from medical records External records from outside source  obtained and reviewed including medical records   Lab Tests:  I Ordered, and personally interpreted labs.  The pertinent results include: No leukocytosis, no anemia, normal kidney function, elevated liver enzymes, low bicarb, elevated anion gap, normal lipase   Imaging Studies ordered:  I ordered imaging studies including CT scan abdomen and pelvis I independently visualized and interpreted imaging which showed no acute intra-abdominal surgical process, appendicoliths without acute inflammation, esophageal thickening I agree with the radiologist interpretation   Cardiac Monitoring: / EKG:  The patient was maintained on a cardiac monitor.  I personally viewed and interpreted the cardiac monitored which showed an underlying rhythm of: Sinus tachycardia, no ST/T wave changes, no ischemic changes, no STEMI   Consultations Obtained:  I requested consultation with the hospitalist,  and discussed lab and imaging findings as well as pertinent plan - they recommend: Admission   Problem List / ED Course / Critical interventions / Medication management  Patient does remain stable at this time.  Patient has had a recurrence of her vomiting despite multiple medications in the emergency department.  Patient does have continued acidosis despite hydration in the emergency department.  Patient's alcohol level is negative at this point and she denies drinking any homemade alcohol, alcohol containing substances.  She denies any excessive salicylate use.  Patient has a normal glucose at this point with no indication for DKA.  CT scan of abdomen and pelvis demonstrated no acute surgical process.  She does have appendicoliths noted with no acute signs of appendicitis.  She is nontender to palpation of the right lower quadrant at this point and do not suspect acute appendicitis.  She does have recurrence of her tachycardia at this point as well and will continue IV hydration.  Do suspect that her acidosis is a  component of dehydration as well as alcohol abuse and withdrawal.  Patient has had no seizure-like activity.  Have discussed patient case with Dr. Dorinda with the hospitalist service who has excepted for admission. I ordered medication including IV fluids, thiamine , Reglan , Benadryl , Dilaudid , Ativan , Zofran  for nausea, vomiting, alcohol abuse with possible withdrawal Reevaluation of the patient after these medicines showed that the patient improved I have reviewed the patients home medicines and have made adjustments as needed   Social Determinants of Health:  None   Test / Admission - Considered:  Admission     Final diagnoses:  None    ED Discharge Orders     None          Daralene Lonni JONETTA DEVONNA 11/20/24 1646    Garrick Charleston, MD 11/20/24 2252

## 2024-11-20 NOTE — ED Notes (Signed)
 Pt reminded about a urine sample.

## 2024-11-20 NOTE — ED Notes (Signed)
Patient given ice pop.

## 2024-11-20 NOTE — ED Notes (Signed)
 Patient transported to CT

## 2024-11-20 NOTE — H&P (Signed)
 History and Physical    Patient: Heidi [name] FMW:[id number] DOB: Sep 06, 1971 DOA: 11/20/2024 DOS: the patient was seen and examined on 11/20/2024 PCP: Terry Wilhelmena Lloyd Hilario, FNP  Patient coming from: Home  Chief Complaint: Nausea and vomiting Chief Complaint  Patient presents with   Nausea   HPI: Heidi [name] is a 53 y.o. female with medical history significant of diabetes mellitus, depression, GERD, hypertension, alcohol abuse, anxiety disorder who has been having nausea and vomiting that have been going on for the last 2 days.  Patient has associated abdominal pain in the epigastric area with intensity 7/10, denies chest pain, cough or urinary complaint.  Upon arrival to the emergency room patient was given several rounds of antiemetic therapy however nausea vomiting did not resolve and therefore [facility] service was contacted to admit patient for further management.  Patient was also noted to have symptoms of alcohol withdrawal.  ED course: Upon arrival to the emergency room patient had temperature 98.1, respiratory rate 22, pulse 145, blood pressure 153/84 saturating 97% on room air.  Review of Systems: As mentioned in the history of present illness. All other systems reviewed and are negative. Past Medical History:  Diagnosis Date   Alcohol withdrawal (HCC)    Anxiety    Cervical radiculopathy    Depression    Diabetes mellitus without complication (HCC)    GERD (gastroesophageal reflux disease)    Hypertension    Past Surgical History:  Procedure Laterality Date   BIOPSY  11/30/2023   Procedure: BIOPSY;  Surgeon: Cindie Carlin POUR, DO;  Location: [facility];  Service: Endoscopy;;   BRAIN SURGERY     COLONOSCOPY WITH PROPOFOL  N/A 11/30/2023   Surgeon: Cindie Carlin K, DO;   5 mm polyp resected and retrieved, stool throughout the entire colon.  Pathology with tubular adenoma.  Recommended 5-year surveillance.   ESOPHAGOGASTRODUODENOSCOPY (EGD) WITH PROPOFOL  N/A  11/30/2023   Surgeon: Cindie Carlin POUR, DO;  Small hiatal hernia, gastritis biopsied, duodenitis.  Pathology with reactive gastropathy and focal erosion, negative for H. pylori. Recommended PPI BID.   FLEXIBLE SIGMOIDOSCOPY N/A 07/13/2023   Procedure: FLEXIBLE SIGMOIDOSCOPY;  Surgeon: Eartha Angelia Sieving, MD;  Location: [facility];  Service: Gastroenterology;  Laterality: N/A;   POLYPECTOMY  11/30/2023   Procedure: POLYPECTOMY;  Surgeon: Cindie Carlin POUR, DO;  Location: [facility];  Service: Endoscopy;;   Social History:  reports that she quit smoking about 8 months ago. Her smoking use included cigarettes. She started smoking about 37 years ago. She has a 64.5 pack-year smoking history. She has never used smokeless tobacco. She reports current alcohol use of about 42.0 standard drinks of alcohol per week. She reports that she does not use drugs.  No Known Allergies  Family History  Problem Relation Age of Onset   Stroke Father     Prior to Admission medications   Medication Sig Start Date End Date Taking? Authorizing Provider  Accu-Chek Softclix Lancets lancets USE TO TEST BLOOD SUGAR THREE TIMES DAILY 09/04/24   Bacchus, Meade PEDLAR, FNP  albuterol  (PROVENTIL ) (2.5 MG/3ML) 0.083% nebulizer solution Take 3 mLs (2.5 mg total) by nebulization every 6 (six) hours as needed for wheezing or shortness of breath. 04/13/24   Del Wilhelmena Lloyd Hilario, FNP  Blood Glucose Monitoring Suppl DEVI 1 each by Does not apply route in the morning, at noon, and at bedtime. May substitute to any manufacturer covered by patient's insurance. 03/22/24   Suellen Cantor A, PA-C  Blood  Pressure Monitor DEVI 1 each by Does not apply route daily. 02/11/24   Miriam Norris, NP  busPIRone  (BUSPAR ) 30 MG tablet TAKE ONE TABLET BY MOUTH EVERY MORNING AND AT BEDTIME 10/16/24   Del Orbe Polanco, Iliana, FNP  cyclobenzaprine  (FLEXERIL ) 10 MG tablet Take 1 tablet (10 mg total) by mouth 2 (two) times daily as needed for  muscle spasms. 04/21/24   Margrette Taft BRAVO, MD  dicyclomine  (BENTYL ) 20 MG tablet Take 1 tablet (20 mg total) by mouth 2 (two) times daily. 09/10/24   Odell Balls, PA-C  diltiazem  (CARDIZEM  CD) 120 MG 24 hr capsule TAKE ONE CAPSULE BY MOUTH DAILY 10/16/24   Del Orbe Polanco, Iliana, FNP  folic acid  (FOLVITE ) 1 MG tablet Take 1 tablet (1 mg total) by mouth daily. 07/25/23   Ricky Fines, MD  furosemide  (LASIX ) 20 MG tablet Take 1 tablet (20 mg total) by mouth daily. 04/04/24 10/05/24  Miriam Norris, NP  gabapentin  (NEURONTIN ) 400 MG capsule TAKE ONE CAPSULE BY MOUTH THREE TIMES DAILY 08/24/24   Margrette Taft BRAVO, MD  glucose blood (ACCU-CHEK GUIDE TEST) test strip Use as instructed three times daily dx e11.65 05/30/24   Del Orbe Polanco, Iliana, FNP  HYDROcodone -acetaminophen  (NORCO/VICODIN) 5-325 MG tablet Take 1 tablet by mouth every 4 (four) hours as needed. 05/23/24   [provider]  hydrOXYzine  (ATARAX ) 25 MG tablet Take 25 mg by mouth at bedtime.    [provider]  hydrOXYzine  (VISTARIL ) 50 MG capsule Take 1 capsule (50 mg total) by mouth 3 (three) times daily as needed. 09/28/24   Bacchus, Gloria Z, FNP  ibuprofen  (ADVIL ) 800 MG tablet Take 800 mg by mouth 3 (three) times daily as needed.    [provider]  levocetirizine (XYZAL ) 5 MG tablet Take 1 tablet (5 mg total) by mouth every evening. 10/16/24   [name], FNP  lidocaine  (LIDODERM ) 5 % Place 1 patch onto the skin daily. Remove & Discard patch within 12 hours or as directed by MD 06/13/24   [name], FNP  lidocaine  (XYLOCAINE ) 2 % solution Use as directed 15 mLs in the mouth or throat as needed. 05/24/24   Del Orbe Polanco, Iliana, FNP  metFORMIN  (GLUCOPHAGE ) 500 MG tablet TAKE ONE TABLET BY MOUTH TWICE DAILY WITH A MEAL 09/04/24   Bacchus, Gloria Z, FNP  metoprolol  tartrate (LOPRESSOR ) 25 MG tablet Take 1 tablet (25 mg total) by mouth 2 (two) times daily. 10/05/24   Miriam Norris,  NP  Misc. Devices (GOJJI WEIGHT SCALE) MISC Daily Weight 02/16/24   Del Wilhelmena Lloyd, [place], FNP  naltrexone  (DEPADE) 50 MG tablet Take 1 tablet (50 mg total) by mouth daily. 10/18/24   Del Wilhelmena Lloyd, Sola, FNP  naproxen  (NAPROSYN ) 500 MG tablet TAKE (1) TABLET BY MOUTH TWICE DAILY WITH MEALS. 08/10/24   Brenna Lin, MD  norethindrone (MICRONOR) 0.35 MG tablet Take 1 tablet by mouth daily. 09/18/24   [provider]  olmesartan  (BENICAR ) 20 MG tablet Take 0.5 tablets (10 mg total) by mouth daily. 10/05/24   Miriam Norris, NP  ondansetron  (ZOFRAN ) 4 MG tablet Take 1 tablet (4 mg total) by mouth every 6 (six) hours. 11/02/24   Lloyd, Megan L, PA  pantoprazole  (PROTONIX ) 40 MG tablet Take 1 tablet (40 mg total) by mouth 2 (two) times daily. 11/15/24 11/15/25  Ezzard Sonny RAMAN, PA-C  polyethylene glycol powder (GLYCOLAX /MIRALAX ) 17 GM/SCOOP powder Take 17 g by mouth daily. Mix with 8 oz of water .  02/28/24   Rudy Josette RAMAN, PA-C  potassium chloride  SA (KLOR-CON  M) 20 MEQ tablet Take 2 tablets (40 mEq total) by mouth daily for 4 days. 06/19/24 10/05/24  Rudy Josette RAMAN, PA-C  pregabalin  (LYRICA ) 50 MG capsule Take 1 capsule (50 mg total) by mouth 2 (two) times daily as needed. 06/16/24   Del Orbe Polanco, Iliana, FNP  rosuvastatin  (CRESTOR ) 40 MG tablet Take 1 tablet (40 mg total) by mouth daily. 04/04/24   Miriam Norris, NP  sertraline  (ZOLOFT ) 25 MG tablet Take 1 tablet (25 mg total) by mouth daily. 06/13/24   [name], FNP  UNABLE TO FIND Power wheelchair x 1  DX F51.937 08/17/24   Del Wilhelmena Lloyd, Sola, FNP  varenicline  (CHANTIX ) 0.5 MG tablet TAKE ONE TABLET BY MOUTH TWICE DAILY 09/13/24   Del Wilhelmena Lloyd, Sola, FNP  VENTOLIN  HFA 108 (90 Base) MCG/ACT inhaler INHALE TWO PUFFS INTO THE LUNGS EVERY 6 HOURS AS NEEDED 08/29/24   Del Wilhelmena Lloyd, Sola, FNP  Vitamin D , Ergocalciferol , (DRISDOL) 1.25 MG (50000 UNIT) CAPS capsule Take 50,000 Units by mouth once a week.  07/27/24   [provider]    Physical Exam: Vitals:   11/20/24 1315 11/20/24 1400 11/20/24 1445 11/20/24 1530  BP: 120/81 129/82 (!) 158/92 (!) 147/90  Pulse: (!) 118 97 (!) 108 (!) 107  Resp: 20 19    Temp:      TempSrc:      SpO2: 94% 98% 96% 93%  Weight:      Height:       General: Middle-aged female laying in bed in no acute pain CVS: S1-S2 present no murmur  HEENT: Poor dentition noted Respiratory system: Clear to auscultation bilaterally Abdominal: Nontender no masses palpable GU: Deferred Psychiatry: Normal mood CNS: Alert and oriented x 3 follows commands Musculoskeletal: Moving all extremities  Data Reviewed:     Latest Ref Rng & Units 11/20/2024   12:08 PM 11/02/2024    1:47 PM 06/16/2024   11:30 AM  CBC  WBC 4.0 - 10.5 K/uL 9.5  9.1  13.7   Hemoglobin 12.0 - 15.0 g/dL 87.3  87.0  87.6   Hematocrit 36.0 - 46.0 % 37.8  38.5  38.1   Platelets 150 - 400 K/uL 270  168  357        Latest Ref Rng & Units 11/20/2024    3:18 PM 11/20/2024   12:08 PM 11/02/2024    3:32 PM  BMP  Glucose 70 - 99 mg/dL 61  78  68   BUN 6 - 20 mg/dL 10  10  16    Creatinine 0.44 - 1.00 mg/dL 9.20  9.05  8.30   Sodium 135 - 145 mmol/L 138  144  137   Potassium 3.5 - 5.1 mmol/L 4.3  4.5  4.3   Chloride 98 - 111 mmol/L 103  104  103   CO2 22 - 32 mmol/L 14  17  19    Calcium  8.9 - 10.3 mg/dL 9.7  89.1  89.9      Assessment and Plan:  Intractable nausea and vomiting Acute metabolic acidosis secondary to above Alcohol withdrawal syndrome CT scan of the abdomen did not show any acute intra-abdominal pathology Continue as needed Zofran  and Phenergan  Continue IV fluid Continue CIWA protocol Continue folic acid  and thiamine  Monitor neurochecks closely  Diabetes mellitus type 2 with hypoglycemia Presented with low glucose of 61 Patient received D5 in the emergency room Continue monitoring glucose levels  Patient has been placed on as needed insulin   therapy  Depression Continue sertraline   GERD Continue pantoprazole   Hypertension Continue home medication including diltiazem  and metoprolol   Anxiety disorder Continue buspirone   DVT prophylaxis-continue Lovenox    Advance Care Planning:   Code Status: Prior full code  Consults: None  Family Communication: None  Severity of Illness: The appropriate patient status for this patient is OBSERVATION. Observation status is judged to be reasonable and necessary in order to provide the required intensity of service to ensure the patient's safety. The patient's presenting symptoms, physical exam findings, and initial radiographic and laboratory data in the context of their medical condition is felt to place them at decreased risk for further clinical deterioration. Furthermore, it is anticipated that the patient will be medically stable for discharge from the hospital within 2 midnights of admission.   Author: Drue ONEIDA Potter, MD 11/20/2024 4:41 PM  For on call review [email or web address].

## 2024-11-20 NOTE — ED Triage Notes (Signed)
 Pt arrived via [facility] C/O N/V X 2 days and presents diaphoretic in Triage. Pt reports she thinks this is related to ETOH use. Pt reports taking a medication recently to stop her cravings for ETOH but ran out.

## 2024-11-21 ENCOUNTER — Other Ambulatory Visit: Payer: Self-pay | Admitting: Orthopedic Surgery

## 2024-11-21 DIAGNOSIS — E860 Dehydration: Secondary | ICD-10-CM | POA: Diagnosis not present

## 2024-11-21 LAB — CBC
HCT: 31.2 % — ABNORMAL LOW (ref 36.0–46.0)
Hemoglobin: 10.5 g/dL — ABNORMAL LOW (ref 12.0–15.0)
MCH: 27.1 pg (ref 26.0–34.0)
MCHC: 33.7 g/dL (ref 30.0–36.0)
MCV: 80.4 fL (ref 80.0–100.0)
Platelets: 200 K/uL (ref 150–400)
RBC: 3.88 MIL/uL (ref 3.87–5.11)
RDW: 17.4 % — ABNORMAL HIGH (ref 11.5–15.5)
WBC: 4.9 K/uL (ref 4.0–10.5)
nRBC: 0 % (ref 0.0–0.2)

## 2024-11-21 LAB — BASIC METABOLIC PANEL WITH GFR
Anion gap: 11 (ref 5–15)
BUN: 7 mg/dL (ref 6–20)
CO2: 22 mmol/L (ref 22–32)
Calcium: 8.9 mg/dL (ref 8.9–10.3)
Chloride: 107 mmol/L (ref 98–111)
Creatinine, Ser: 0.77 mg/dL (ref 0.44–1.00)
GFR, Estimated: >60 mL/min (ref >60)
Glucose, Bld: 90 mg/dL (ref 70–99)
Potassium: 3.5 mmol/L (ref 3.5–5.1)
Sodium: 140 mmol/L (ref 135–145)

## 2024-11-21 LAB — GLUCOSE, CAPILLARY
Glucose-Capillary: 91 mg/dL (ref 70–99)
Glucose-Capillary: 122 mg/dL — ABNORMAL HIGH (ref 70–99)

## 2024-11-21 LAB — HIV ANTIBODY (ROUTINE TESTING W REFLEX): HIV Screen 4th Generation wRfx: NONREACTIVE

## 2024-11-21 MED ORDER — ALBUTEROL SULFATE (2.5 MG/3ML) 0.083% IN NEBU: 2.5000 mg | INHALATION_SOLUTION | Freq: Four times a day (QID) | RESPIRATORY_TRACT | 1 refills | Status: AC | PRN

## 2024-11-21 MED ORDER — NAPROXEN 500 MG PO TABS: ORAL_TABLET | ORAL | 2 refills | Status: DC

## 2024-11-21 MED ORDER — PROCHLORPERAZINE 25 MG RE SUPP
25.0000 mg | Freq: Once | RECTAL | Status: AC
  Administered 2024-11-21: 25 mg via RECTAL
  Filled 2024-11-21: qty 1

## 2024-11-21 MED ORDER — DILTIAZEM HCL ER COATED BEADS 120 MG PO CP24: 120.0000 mg | ORAL_CAPSULE | Freq: Every day | ORAL | 5 refills | Status: AC

## 2024-11-21 MED ORDER — BUSPIRONE HCL 30 MG PO TABS: 30.0000 mg | ORAL_TABLET | Freq: Three times a day (TID) | ORAL | 3 refills | Status: AC

## 2024-11-21 MED ORDER — METFORMIN HCL 500 MG PO TABS: 500.0000 mg | ORAL_TABLET | Freq: Two times a day (BID) | ORAL | 3 refills | Status: AC

## 2024-11-21 MED ORDER — GABAPENTIN 400 MG PO CAPS: 400.0000 mg | ORAL_CAPSULE | Freq: Three times a day (TID) | ORAL | 5 refills | Status: AC

## 2024-11-21 MED ORDER — HYDROXYZINE PAMOATE 50 MG PO CAPS: 50.0000 mg | ORAL_CAPSULE | Freq: Three times a day (TID) | ORAL | 2 refills | Status: AC | PRN

## 2024-11-21 MED ORDER — PROMETHAZINE HCL 25 MG/ML IJ SOLN
INTRAMUSCULAR | Status: AC
  Filled 2024-11-21: qty 1

## 2024-11-21 MED ORDER — ACCU-CHEK SOFTCLIX LANCETS MISC: 3 refills | Status: AC

## 2024-11-21 MED ORDER — HYDROXYZINE HCL 25 MG PO TABS: 25.0000 mg | ORAL_TABLET | Freq: Every day | ORAL | 4 refills | Status: AC

## 2024-11-21 MED ORDER — MULTI-VITAMIN/MINERALS PO TABS: 1.0000 | ORAL_TABLET | Freq: Every day | ORAL | 2 refills | Status: AC

## 2024-11-21 MED ORDER — ROSUVASTATIN CALCIUM 40 MG PO TABS: 40.0000 mg | ORAL_TABLET | Freq: Every day | ORAL | 1 refills | Status: AC

## 2024-11-21 MED ORDER — SERTRALINE HCL 25 MG PO TABS: 25.0000 mg | ORAL_TABLET | Freq: Every day | ORAL | 3 refills | Status: AC

## 2024-11-21 MED ORDER — VITAMIN B-1 100 MG PO TABS: 100.0000 mg | ORAL_TABLET | Freq: Every day | ORAL | 5 refills | Status: AC

## 2024-11-21 MED ORDER — CLONIDINE 0.2 MG/24HR TD PTWK: 0.2000 mg | MEDICATED_PATCH | TRANSDERMAL | 12 refills | Status: AC

## 2024-11-21 MED ORDER — ONDANSETRON 4 MG PO TBDP: 4.0000 mg | ORAL_TABLET | Freq: Four times a day (QID) | ORAL | 1 refills | Status: AC | PRN

## 2024-11-21 MED ORDER — FOLIC ACID 1 MG PO TABS: 1.0000 mg | ORAL_TABLET | Freq: Every day | ORAL | 5 refills | Status: AC

## 2024-11-21 MED ORDER — DIAZEPAM 5 MG PO TABS
5.0000 mg | ORAL_TABLET | Freq: Once | ORAL | Status: AC
  Administered 2024-11-21: 5 mg via ORAL
  Filled 2024-11-21: qty 1

## 2024-11-21 MED ORDER — PANTOPRAZOLE SODIUM 40 MG PO TBEC: 40.0000 mg | DELAYED_RELEASE_TABLET | Freq: Two times a day (BID) | ORAL | 11 refills | Status: AC

## 2024-11-21 MED ORDER — METOPROLOL TARTRATE 25 MG PO TABS: 25.0000 mg | ORAL_TABLET | Freq: Two times a day (BID) | ORAL | 5 refills | Status: AC

## 2024-11-21 MED ORDER — ENSURE PLUS HIGH PROTEIN PO LIQD
237.0000 mL | Freq: Two times a day (BID) | ORAL | Status: DC
  Administered 2024-11-21: 237 mL via ORAL

## 2024-11-21 MED ORDER — ALBUTEROL SULFATE HFA 108 (90 BASE) MCG/ACT IN AERS: 2.0000 | INHALATION_SPRAY | Freq: Four times a day (QID) | RESPIRATORY_TRACT | 2 refills | Status: AC | PRN

## 2024-11-21 NOTE — Discharge Instructions (Signed)
 1)Avoid ibuprofen /Advil /Aleve /Motrin [name]/Naproxen /BC powders/Meloxicam/Diclofenac/Indomethacin  and other Nonsteroidal anti-inflammatory medications as these will make you more likely to bleed and can cause stomach ulcers, can also cause Kidney problems.   2)The 'BRAT' diet is suggested, then progress to diet as tolerated as symptoms abate.  -- BRAT (bananas, rice, apples, toast) -you may also consume other mild foods that ease the GI tract such as saltines, oatmeal, or boiled potatoes. Call if bloody stools, persistent diarrhea, vomiting, fever or abdominal pain.  3) follow-up primary care physician within a week for recheck and reevaluation  4) complete abstinence from alcohol advised--outpatient or inpatient alcohol rehab programs recommended

## 2024-11-21 NOTE — Discharge Summary (Signed)
 "                                                                                   Heidi [name], is a 53 y.o. female  DOB March 15, 1971  MRN [id number].  Admission date:  11/20/2024  Admitting Physician  Heidi ONEIDA Potter, MD  Discharge Date:  11/21/2024   Primary MD  Heidi Wilhelmena Lloyd Sola, FNP  Recommendations for primary care physician for things to follow:  1)Avoid ibuprofen /Advil /Aleve /Motrin Heidi [name]/Naproxen /BC [name]/Meloxicam/Diclofenac/Indomethacin  and other Nonsteroidal anti-inflammatory medications as these will make you more likely to bleed and can cause stomach ulcers, can also cause Kidney problems.   2)The 'BRAT' diet is suggested, then progress to diet as tolerated as symptoms abate.  -- BRAT (bananas, rice, apples, toast) -you may also consume other mild foods that ease the GI tract such as saltines, oatmeal, or boiled potatoes. Call if bloody stools, persistent diarrhea, vomiting, fever or abdominal pain.  3) follow-up primary care physician within a week for recheck and reevaluation  4) complete abstinence from alcohol advised--outpatient or inpatient alcohol rehab programs recommended  Admission Diagnosis  Dehydration [E86.0] Metabolic acidosis [E87.20] Intractable nausea and vomiting [R11.2]   Discharge Diagnosis  Dehydration [E86.0] Metabolic acidosis [E87.20] Intractable nausea and vomiting [R11.2]   Principal Problem:   Dehydration      Past Medical History:  Diagnosis Date   Alcohol withdrawal (HCC)    Anxiety    Cervical radiculopathy    Depression    Diabetes mellitus without complication (HCC)    GERD (gastroesophageal reflux disease)    Hypertension     Past Surgical History:  Procedure Laterality Date   BIOPSY  11/30/2023   Procedure: BIOPSY;  Surgeon: Heidi Carlin POUR, DO;  [facility]: [facility];  Service: Endoscopy;;   BRAIN SURGERY     COLONOSCOPY WITH PROPOFOL  N/A 11/30/2023   Surgeon: Heidi Carlin K, DO;   5 mm polyp  resected and retrieved, stool throughout the entire colon.  Pathology with tubular adenoma.  Recommended 5-year surveillance.   ESOPHAGOGASTRODUODENOSCOPY (EGD) WITH PROPOFOL  N/A 11/30/2023   Surgeon: Heidi Carlin POUR, DO;  Small hiatal hernia, gastritis biopsied, duodenitis.  Pathology with reactive gastropathy and focal erosion, negative for H. pylori. Recommended PPI BID.   FLEXIBLE SIGMOIDOSCOPY N/A 07/13/2023   Procedure: FLEXIBLE SIGMOIDOSCOPY;  Surgeon: Heidi Angelia Sieving, MD;  Location: [facility];  Service: Gastroenterology;  Laterality: N/A;   POLYPECTOMY  11/30/2023   Procedure: POLYPECTOMY;  Surgeon: Heidi Carlin POUR, DO;  Location: [facility];  Service: Endoscopy;;     HPI  from the history and physical done on the day of admission:   HPI: Heidi [name] is a 53 y.o. female with medical history significant of diabetes mellitus, depression, GERD, hypertension, alcohol abuse, anxiety disorder who has been having nausea and vomiting that have been going on for the last 2 days.  Patient has associated abdominal pain in the epigastric area with intensity 7/10, denies chest pain, cough or urinary complaint.  Upon arrival to the emergency room patient was given several rounds of antiemetic therapy however nausea vomiting did not resolve and therefore [facility] service was contacted to admit  patient for further management.  Patient was also noted to have symptoms of alcohol withdrawal.   ED course: Upon arrival to the emergency room patient had temperature 98.1, respiratory rate 22, pulse 145, blood pressure 153/84 saturating 97% on room air.   Review of Systems: As mentioned in the history of present illness. All other systems reviewed and are negative.    Hospital Course:   Intractable nausea and vomiting Acute metabolic acidosis secondary to above Alcohol withdrawal syndrome CT scan of the abdomen did not show any acute intra-abdominal pathology Patient was treated with  antiemetics including  as needed Zofran  and Phenergan  -Patient received aggressive IV hydration/IV fluids until emesis resolved oral intake became more reliable -Patient was treated with benzos per [facility]WA protocol, folic acid  and thiamine  -Overall much improved, tolerating oral intake well without further emesis   Diabetes mellitus type 2 with hypoglycemia Presented with low glucose of 61 in the setting of intractable emesis and poor oral intake Patient received D5 in the emergency room -Patient eating and drinking well without further emesis, no further concerns about hypoglycemia --May resume PTA diabetic regimen   Depression/anxiety disorder Stable, continue sertraline , buspirone  and hydroxyzine    GERD Stable, continue pantoprazole , avoid NSAIDs   Hypertension Continue home medication including diltiazem , and olmesartan  and metoprolol    Discharge Condition: Stable  Follow UP   Follow-up Information     Heidi Wilhelmena Lloyd Sola, FNP. Schedule an appointment as soon as possible for a visit.   Specialty: Family Medicine Contact information: [address] [place] [address] [phone]                 Diet and Activity recommendation:  As advised  Discharge Instructions   Discharge Instructions     Call MD for:  difficulty breathing, headache or visual disturbances   Complete by: As directed    Call MD for:  persistant dizziness or light-headedness   Complete by: As directed    Call MD for:  persistant nausea and vomiting   Complete by: As directed    Call MD for:  temperature >100.4   Complete by: As directed    Diet - low sodium heart healthy   Complete by: As directed    Diet Carb Modified   Complete by: As directed    Discharge instructions   Complete by: As directed    1)Avoid ibuprofen /Advil /Aleve /Motrin Heidi [name]/Naproxen /BC [name]/Meloxicam/Diclofenac/Indomethacin  and other Nonsteroidal anti-inflammatory medications as these will make  you more likely to bleed and can cause stomach ulcers, can also cause Kidney problems.   2)The 'BRAT' diet is suggested, then progress to diet as tolerated as symptoms abate.  -- BRAT (bananas, rice, apples, toast) -you may also consume other mild foods that ease the GI tract such as saltines, oatmeal, or boiled potatoes. Call if bloody stools, persistent diarrhea, vomiting, fever or abdominal pain.  3) follow-up primary care physician within a week for recheck and reevaluation  4) complete abstinence from alcohol advised--outpatient or inpatient alcohol rehab programs recommended   Increase activity slowly   Complete by: As directed        Discharge Medications     Allergies as of 11/21/2024   No Known Allergies      Medication List     STOP taking these medications    ibuprofen  800 MG tablet Commonly known as: ADVIL    naproxen  500 MG tablet Commonly known as: NAPROSYN    ondansetron  4 MG tablet Commonly known as: ZOFRAN   TAKE these medications    Accu-Chek Guide Test test strip Generic drug: glucose blood Use as instructed three times daily dx e11.65   Accu-Chek Softclix Lancets lancets Use as instructed What changed: See the new instructions.   albuterol  (2.5 MG/3ML) 0.083% nebulizer solution Commonly known as: PROVENTIL  Take 3 mLs (2.5 mg total) by nebulization every 6 (six) hours as needed for wheezing or shortness of breath.   albuterol  108 (90 Base) MCG/ACT inhaler Commonly known as: Ventolin  HFA Inhale 2 puffs into the lungs every 6 (six) hours as needed for shortness of breath or wheezing.   Blood Glucose Monitoring Suppl Devi 1 each by Does not apply route in the morning, at noon, and at bedtime. May substitute to any manufacturer covered by patient's insurance.   Blood Pressure Monitor Devi 1 each by Does not apply route daily.   busPIRone  30 MG tablet Commonly known as: BUSPAR  Take 1 tablet (30 mg total) by mouth 3 (three) times  daily. What changed: See the new instructions.   cloNIDine  0.2 mg/24hr patch Commonly known as: CATAPRES  - Dosed in mg/24 hr Place 1 patch (0.2 mg total) onto the skin once a week.   diltiazem  120 MG 24 hr capsule Commonly known as: CARDIZEM  CD Take 1 capsule (120 mg total) by mouth daily.   folic acid  1 MG tablet Commonly known as: FOLVITE  Take 1 tablet (1 mg total) by mouth daily. Start taking on: November 22, 2024   gabapentin  400 MG capsule Commonly known as: NEURONTIN  Take 1 capsule (400 mg total) by mouth 3 (three) times daily.   Gojji Weight Scale Misc Daily Weight   HYDROcodone -acetaminophen  10-325 MG tablet Commonly known as: NORCO Take 1 tablet by mouth every 6 (six) hours.   hydrOXYzine  25 MG tablet Commonly known as: ATARAX  Take 1 tablet (25 mg total) by mouth at bedtime.   hydrOXYzine  50 MG capsule Commonly known as: VISTARIL  Take 1 capsule (50 mg total) by mouth 3 (three) times daily as needed.   levocetirizine 5 MG tablet Commonly known as: XYZAL  Take 1 tablet (5 mg total) by mouth every evening.   lidocaine  5 % Commonly known as: Lidoderm  Place 1 patch onto the skin daily. Remove & Discard patch within 12 hours or as directed by MD   metFORMIN  500 MG tablet Commonly known as: GLUCOPHAGE  Take 1 tablet (500 mg total) by mouth 2 (two) times daily with a meal.   metoprolol  tartrate 25 MG tablet Commonly known as: LOPRESSOR  Take 1 tablet (25 mg total) by mouth 2 (two) times daily.   multivitamin with minerals tablet Take 1 tablet by mouth daily.   naltrexone  50 MG tablet Commonly known as: DEPADE Take 1 tablet (50 mg total) by mouth daily.   norethindrone 0.35 MG tablet Commonly known as: MICRONOR Take 1 tablet by mouth daily.   olmesartan  20 MG tablet Commonly known as: BENICAR  Take 0.5 tablets (10 mg total) by mouth daily.   ondansetron  4 MG disintegrating tablet Commonly known as: ZOFRAN -ODT Take 1 tablet (4 mg total) by mouth every 6  (six) hours as needed for vomiting or nausea. What changed: reasons to take this   pantoprazole  40 MG tablet Commonly known as: PROTONIX  Take 1 tablet (40 mg total) by mouth 2 (two) times daily.   polyethylene glycol powder 17 GM/SCOOP powder Commonly known as: GLYCOLAX /MIRALAX  Take 17 g by mouth daily. Mix with 8 oz of water .   rosuvastatin  40 MG tablet Commonly known as: CRESTOR  Take 1 tablet (  40 mg total) by mouth daily.   sertraline  25 MG tablet Commonly known as: ZOLOFT  Take 1 tablet (25 mg total) by mouth daily.   thiamine  100 MG tablet Commonly known as: Vitamin B-1 Take 1 tablet (100 mg total) by mouth daily. Start taking on: November 22, 2024   UNABLE TO FIND Power wheelchair x 1  DX M48.062   varenicline  0.5 MG tablet Commonly known as: CHANTIX  TAKE ONE TABLET BY MOUTH TWICE DAILY   Vitamin D  (Ergocalciferol ) 1.25 MG (50000 UNIT) Caps capsule Commonly known as: DRISDOL Take 50,000 Units by mouth once a week.       Major procedures and Radiology Reports - PLEASE review detailed and final reports for all details, in brief -   CT ABDOMEN PELVIS W CONTRAST Result Date: 11/20/2024 CLINICAL DATA:  Epigastric pain with nausea. EXAM: CT ABDOMEN AND PELVIS WITH CONTRAST TECHNIQUE: Multidetector CT imaging of the abdomen and pelvis was performed using the standard protocol following bolus administration of intravenous contrast. RADIATION DOSE REDUCTION: This exam was performed according to the departmental dose-optimization program which includes automated exposure control, adjustment of the mA and/or kV according to patient size and/or use of iterative reconstruction technique. CONTRAST:  OMNIPAQUE  IOHEXOL  300 MG/ML  SOLN COMPARISON:  Abdominopelvic CT 11/02/2024 and 02/08/2024. FINDINGS: Lower chest: Clear lung bases. No significant pleural or pericardial effusion. Hepatobiliary: The liver has a non cirrhotic morphology without suspicious focal abnormality. No  evidence of gallstones, gallbladder wall thickening or biliary dilatation. Pancreas: Unremarkable. No pancreatic ductal dilatation or surrounding inflammatory changes. Spleen: Normal in size without focal abnormality. Adrenals/Urinary Tract: Both adrenal glands appear normal. No evidence of urinary tract calculus, suspicious renal lesion or hydronephrosis. The bladder appears unremarkable for its degree of distention. Stomach/Bowel: Enteric contrast has passed into the rectum. Possible mild distal esophageal wall thickening. The stomach appears unremarkable for its degree of distention. No small bowel distension, wall thickening or surrounding inflammation. Curvilinear high density within the appendiceal lumen appears similar to the most recent prior study and new from 02/08/2024, potentially enteric contrast although suggesting an appendicolith. The appendiceal tip appears normal. No significant associated appendiceal distension, wall thickening or surrounding inflammation. The colon is decompressed without focal abnormality or surrounding inflammation. Vascular/Lymphatic: There are no enlarged abdominal or pelvic lymph nodes. Mild aortoiliac atherosclerosis. No evidence of aneurysm or large vessel occlusion. Reproductive: Probable fundal uterine fibroids, as before. No adnexal mass. Other: No evidence of abdominal wall mass or hernia. No ascites or pneumoperitoneum. Musculoskeletal: No acute or significant osseous findings. Mild lumbar facet arthropathy. IMPRESSION: 1. No acute findings or explanation for the patient's symptoms. 2. Possible mild distal esophageal wall thickening which could indicate esophagitis. 3. Possible appendicolith(s) without evidence of acute appendicitis. 4. Probable uterine fibroids. 5.  Aortic Atherosclerosis (ICD10-I70.0). Electronically Signed   By: Elsie Perone M.D.   On: 11/20/2024 15:31   CT ABDOMEN PELVIS W CONTRAST Result Date: 11/02/2024 CLINICAL DATA:  Abdominal pain.  EXAM: CT ABDOMEN AND PELVIS WITH CONTRAST TECHNIQUE: Multidetector CT imaging of the abdomen and pelvis was performed using the standard protocol following bolus administration of intravenous contrast. RADIATION DOSE REDUCTION: This exam was performed according to the departmental dose-optimization program which includes automated exposure control, adjustment of the mA and/or kV according to patient size and/or use of iterative reconstruction technique. CONTRAST:  80mL OMNIPAQUE  IOHEXOL  300 MG/ML  SOLN COMPARISON:  CT abdomen pelvis dated 02/08/2024. FINDINGS: Lower chest: The visualized lung bases are clear. No intra-abdominal free air or  free fluid. Hepatobiliary: The liver is unremarkable. No dilatation. The gallbladder is unremarkable. Pancreas: Unremarkable. No pancreatic ductal dilatation or surrounding inflammatory changes. Spleen: Normal in size without focal abnormality. Adrenals/Urinary Tract: The adrenal glands unremarkable. There is no hydronephrosis on either side. Apparent slight delayed enhancement and excretion of contrast by the left kidney of indeterminate etiology. The visualized ureters and urinary bladder appear unremarkable. Stomach/Bowel: There is no bowel obstruction or active inflammation. The appendix is not visualized with certainty. No inflammatory changes identified in the right lower quadrant. Vascular/Lymphatic: Mild aortoiliac atherosclerotic disease. The IVC is unremarkable. No portal venous gas. There is no adenopathy. Reproductive: The uterus is anteverted. There is a 3 cm from the thyroid . No suspicious adnexal masses. Other: None Musculoskeletal: No acute or significant osseous findings. IMPRESSION: 1. No acute intra-abdominal or pelvic pathology. 2.  Aortic Atherosclerosis (ICD10-I70.0). Electronically Signed   By: Vanetta Chou M.D.   On: 11/02/2024 17:00   Today   Subjective    Heidi [name] today has no new complaints  - No Nausea, Vomiting or Diarrhea  No fever   Or chills  - Tolerating oral intake well         Patient has been seen and examined prior to discharge   Objective   Blood pressure 131/84, pulse (!) 108, temperature 98 F (36.7 C), temperature source Oral, resp. rate 16, height 5' 3 (1.6 m), weight 84.4 kg, SpO2 98%.   Intake/Output Summary (Last 24 hours) at 11/21/2024 1501 Last data filed at 11/21/2024 1300 Gross per 24 hour  Intake 1353.16 ml  Output --  Net 1353.16 ml    Exam Gen:- Awake Alert, no acute distress  HEENT:- [place].AT, No sclera icterus Neck-Supple Neck,No JVD,.  Lungs-  CTAB , good air movement bilaterally CV- S1, S2 normal, regular Abd-  +ve B.Sounds, Abd Soft, No tenderness,    Extremity/Skin:- No  edema,   good pulses Psych-affect is appropriate, oriented x3 Neuro-no new focal deficits, no tremors    Data Review   CBC w Diff:  Lab Results  Component Value Date   WBC 4.9 11/21/2024   HGB 10.5 (L) 11/21/2024   HGB 12.3 06/16/2024   HCT 31.2 (L) 11/21/2024   HCT 38.1 06/16/2024   PLT 200 11/21/2024   PLT 357 06/16/2024   LYMPHOPCT 18 11/20/2024   MONOPCT 5 11/20/2024   EOSPCT 0 11/20/2024   BASOPCT 1 11/20/2024    CMP:  Lab Results  Component Value Date   NA 140 11/21/2024   NA 143 09/01/2024   K 3.5 11/21/2024   CL 107 11/21/2024   CO2 22 11/21/2024   BUN 7 11/21/2024   BUN 13 09/01/2024   CREATININE 0.77 11/21/2024   CREATININE 0.61 01/12/2024   PROT 8.6 (H) 11/20/2024   PROT 6.8 06/16/2024   ALBUMIN 5.4 (H) 11/20/2024   ALBUMIN 4.6 06/16/2024   BILITOT 1.1 11/20/2024   BILITOT 0.3 06/16/2024   ALKPHOS 138 (H) 11/20/2024   AST 170 (H) 11/20/2024   ALT 101 (H) 11/20/2024  .  Total Discharge time is about 33 minutes  Rendall Carwin M.D on 11/21/2024 at 3:01 PM  Go to [email or web address] -  for contact info  [facility] - Office  [phone]   "

## 2024-11-21 NOTE — Telephone Encounter (Signed)
 refills Requested Naproxen  By fax from [facility]

## 2024-11-21 NOTE — Plan of Care (Signed)

## 2024-11-21 NOTE — TOC CM/SW Note (Signed)
 Transition of Care [facility]) - Inpatient Brief Assessment   Patient Details  Name: Heidi [name] MRN: [id number] Date of Birth: August 03, 1971  Transition of [facility]) CM/SW Contact:    Lucie Lunger, LCSWA Phone Number: 11/21/2024, 9:18 AM   Clinical Narrative: Transition of Care Department [facility]) has reviewed patient and no TOC needs have been identified at this time. We will continue to monitor patient advancement through interdiciplinary progression rounds. If new patient transition needs arise, please place a TOC consult.  Transition of Care Asessment: Insurance and Status: Insurance coverage has been reviewed Patient has primary care physician: Yes Home environment has been reviewed: From home Prior level of function:: Independent Prior/Current Home Services: No current home services Social Drivers of Health Review: SDOH reviewed no interventions necessary Readmission risk has been reviewed: Yes Transition of care needs: no transition of care needs at this time

## 2024-11-22 ENCOUNTER — Ambulatory Visit: Payer: MEDICAID | Admitting: Podiatry

## 2024-11-22 DIAGNOSIS — Z91199 Patient's noncompliance with other medical treatment and regimen due to unspecified reason: Secondary | ICD-10-CM

## 2024-11-22 NOTE — Progress Notes (Signed)
1. No-show for appointment    Recently discharged from hospital.

## 2024-11-23 ENCOUNTER — Telehealth: Payer: Self-pay

## 2024-11-23 NOTE — Telephone Encounter (Signed)
 Copied from CRM [id number]. Topic: Clinical - Order For Equipment >> Oct 13, 2024  2:11 PM Hadassah PARAS wrote: Reason for CRM: Compnay Faxed over paperowrk for  pt getting a power wheelchair from [employer]  to [employer] on 10/10 and 10/17 and have not received a response.   Please Fax#[phone]   I mentioned that PCP is out of office and Rep stated that if a new provider will be signing off then pt would need to have a face to face w doctor taking over due to insurance purposes. >> Nov 23, 2024 10:27 AM Terri MATSU wrote: Patient is calling regarding her power wheelchair from [employer] . Still waiting on Dr,Gloria to fill out paperwork and fax it back. And she also stated Dr.Gloria took her off her meds and didn't offer her an alternative and it made her go into the hospital and she is upset. >> Oct 23, 2024  1:04 PM Gustabo D wrote: Lauraine calling from [employer]- [phone] please give her a call she has called multiple times about this

## 2024-11-24 NOTE — Telephone Encounter (Signed)
Form faxed back to [employer].

## 2024-12-05 NOTE — Telephone Encounter (Signed)
 REFAXED

## 2024-12-06 ENCOUNTER — Inpatient Hospital Stay: Payer: Self-pay | Admitting: Family Medicine

## 2024-12-07 ENCOUNTER — Telehealth: Payer: Self-pay

## 2024-12-07 NOTE — Telephone Encounter (Signed)
 Refaxed on Monday 12/15

## 2024-12-07 NOTE — Telephone Encounter (Signed)
 Copied from CRM [id number]. Topic: Clinical - Order For Equipment >> Dec 07, 2024 11:00 AM Delon DASEN wrote: Reason for CRM: waiting for an update on the power wheelchair - [phone]

## 2025-01-01 ENCOUNTER — Other Ambulatory Visit: Payer: Self-pay | Admitting: Family Medicine

## 2025-01-11 ENCOUNTER — Other Ambulatory Visit: Payer: Self-pay

## 2025-01-31 ENCOUNTER — Ambulatory Visit: Payer: MEDICAID | Admitting: Nurse Practitioner

## 2025-02-12 ENCOUNTER — Ambulatory Visit: Payer: MEDICAID | Admitting: Neurology

## 2025-02-14 ENCOUNTER — Ambulatory Visit: Payer: MEDICAID | Admitting: Nurse Practitioner

## 2025-03-27 ENCOUNTER — Ambulatory Visit: Payer: MEDICAID | Admitting: Nurse Practitioner
